# Patient Record
Sex: Male | Born: 1945 | Race: White | Hispanic: No | Smoking: Never smoker
Health system: Southern US, Community
[De-identification: ages and names within clinical notes are randomized; demographics above are authoritative.]

## PROBLEM LIST (undated history)

## (undated) DIAGNOSIS — Z973 Presence of spectacles and contact lenses: Secondary | ICD-10-CM

## (undated) DIAGNOSIS — E785 Hyperlipidemia, unspecified: Secondary | ICD-10-CM

## (undated) DIAGNOSIS — K4090 Unilateral inguinal hernia, without obstruction or gangrene, not specified as recurrent: Secondary | ICD-10-CM

## (undated) DIAGNOSIS — I2510 Atherosclerotic heart disease of native coronary artery without angina pectoris: Secondary | ICD-10-CM

## (undated) DIAGNOSIS — N200 Calculus of kidney: Secondary | ICD-10-CM

## (undated) DIAGNOSIS — N400 Enlarged prostate without lower urinary tract symptoms: Secondary | ICD-10-CM

## (undated) DIAGNOSIS — Z972 Presence of dental prosthetic device (complete) (partial): Secondary | ICD-10-CM

## (undated) DIAGNOSIS — K219 Gastro-esophageal reflux disease without esophagitis: Secondary | ICD-10-CM

## (undated) DIAGNOSIS — I4891 Unspecified atrial fibrillation: Secondary | ICD-10-CM

## (undated) DIAGNOSIS — C9110 Chronic lymphocytic leukemia of B-cell type not having achieved remission: Secondary | ICD-10-CM

## (undated) DIAGNOSIS — R7303 Prediabetes: Secondary | ICD-10-CM

## (undated) DIAGNOSIS — M1990 Unspecified osteoarthritis, unspecified site: Secondary | ICD-10-CM

## (undated) DIAGNOSIS — H9190 Unspecified hearing loss, unspecified ear: Secondary | ICD-10-CM

## (undated) DIAGNOSIS — D801 Nonfamilial hypogammaglobulinemia: Secondary | ICD-10-CM

## (undated) DIAGNOSIS — I350 Nonrheumatic aortic (valve) stenosis: Secondary | ICD-10-CM

## (undated) DIAGNOSIS — I10 Essential (primary) hypertension: Secondary | ICD-10-CM

## (undated) HISTORY — DX: Atherosclerotic heart disease of native coronary artery without angina pectoris: I25.10

## (undated) HISTORY — DX: Unspecified osteoarthritis, unspecified site: M19.90

## (undated) HISTORY — PX: BONE MARROW ASPIRATION: SHX1252

## (undated) HISTORY — DX: Essential (primary) hypertension: I10

## (undated) HISTORY — PX: MULTIPLE TOOTH EXTRACTIONS: SHX2053

## (undated) HISTORY — DX: Nonrheumatic aortic (valve) stenosis: I35.0

## (undated) HISTORY — DX: Hyperlipidemia, unspecified: E78.5

## (undated) HISTORY — PX: PORTACATH PLACEMENT: SHX2246

## (undated) HISTORY — DX: Gastro-esophageal reflux disease without esophagitis: K21.9

## (undated) HISTORY — PX: COLON SURGERY: SHX602

## (undated) HISTORY — DX: Chronic lymphocytic leukemia of B-cell type not having achieved remission: C91.10

## (undated) HISTORY — DX: Benign prostatic hyperplasia without lower urinary tract symptoms: N40.0

## (undated) HISTORY — DX: Unspecified atrial fibrillation: I48.91

## (undated) HISTORY — PX: ESOPHAGOGASTRODUODENOSCOPY: SHX1529

## (undated) HISTORY — DX: Unilateral inguinal hernia, without obstruction or gangrene, not specified as recurrent: K40.90

## (undated) HISTORY — PX: LITHOTRIPSY: SUR834

---

## 2003-03-18 ENCOUNTER — Encounter: Admission: RE | Admit: 2003-03-18 | Discharge: 2003-03-18 | Payer: Self-pay | Admitting: Oncology

## 2003-03-18 ENCOUNTER — Encounter (HOSPITAL_COMMUNITY): Admission: RE | Admit: 2003-03-18 | Discharge: 2003-04-17 | Payer: Self-pay | Admitting: Oncology

## 2003-06-20 ENCOUNTER — Encounter: Admission: RE | Admit: 2003-06-20 | Discharge: 2003-06-20 | Payer: Self-pay | Admitting: Oncology

## 2003-06-20 ENCOUNTER — Encounter (HOSPITAL_COMMUNITY): Admission: RE | Admit: 2003-06-20 | Discharge: 2003-07-20 | Payer: Self-pay | Admitting: Oncology

## 2003-08-13 ENCOUNTER — Ambulatory Visit (HOSPITAL_COMMUNITY): Admission: RE | Admit: 2003-08-13 | Discharge: 2003-08-13 | Payer: Self-pay | Admitting: Family Medicine

## 2003-08-16 ENCOUNTER — Emergency Department (HOSPITAL_COMMUNITY): Admission: EM | Admit: 2003-08-16 | Discharge: 2003-08-16 | Payer: Self-pay | Admitting: Emergency Medicine

## 2003-08-26 ENCOUNTER — Ambulatory Visit (HOSPITAL_COMMUNITY): Admission: RE | Admit: 2003-08-26 | Discharge: 2003-08-26 | Payer: Self-pay | Admitting: Orthopaedic Surgery

## 2003-09-11 ENCOUNTER — Encounter: Admission: RE | Admit: 2003-09-11 | Discharge: 2003-09-11 | Payer: Self-pay | Admitting: Oncology

## 2003-09-11 ENCOUNTER — Encounter (HOSPITAL_COMMUNITY): Admission: RE | Admit: 2003-09-11 | Discharge: 2003-10-11 | Payer: Self-pay | Admitting: Oncology

## 2003-12-09 ENCOUNTER — Encounter (HOSPITAL_COMMUNITY): Admission: RE | Admit: 2003-12-09 | Discharge: 2003-12-27 | Payer: Self-pay | Admitting: Oncology

## 2003-12-09 ENCOUNTER — Encounter: Admission: RE | Admit: 2003-12-09 | Discharge: 2003-12-27 | Payer: Self-pay | Admitting: Oncology

## 2004-01-28 ENCOUNTER — Encounter: Admission: RE | Admit: 2004-01-28 | Discharge: 2004-01-28 | Payer: Self-pay | Admitting: Oncology

## 2004-01-28 ENCOUNTER — Ambulatory Visit (HOSPITAL_COMMUNITY): Payer: Self-pay | Admitting: Oncology

## 2004-01-28 ENCOUNTER — Encounter (HOSPITAL_COMMUNITY): Admission: RE | Admit: 2004-01-28 | Discharge: 2004-02-27 | Payer: Self-pay | Admitting: Oncology

## 2004-02-03 ENCOUNTER — Ambulatory Visit: Payer: Self-pay | Admitting: Family Medicine

## 2004-04-01 ENCOUNTER — Ambulatory Visit (HOSPITAL_COMMUNITY): Admission: RE | Admit: 2004-04-01 | Discharge: 2004-04-01 | Payer: Self-pay | Admitting: General Surgery

## 2004-04-06 ENCOUNTER — Ambulatory Visit (HOSPITAL_COMMUNITY): Payer: Self-pay | Admitting: Oncology

## 2004-04-06 ENCOUNTER — Encounter: Admission: RE | Admit: 2004-04-06 | Discharge: 2004-04-06 | Payer: Self-pay | Admitting: Oncology

## 2004-04-06 ENCOUNTER — Encounter (HOSPITAL_COMMUNITY): Admission: RE | Admit: 2004-04-06 | Discharge: 2004-05-06 | Payer: Self-pay | Admitting: Oncology

## 2004-05-07 ENCOUNTER — Encounter (HOSPITAL_COMMUNITY): Admission: RE | Admit: 2004-05-07 | Discharge: 2004-06-06 | Payer: Self-pay | Admitting: Oncology

## 2004-05-07 ENCOUNTER — Encounter: Admission: RE | Admit: 2004-05-07 | Discharge: 2004-05-07 | Payer: Self-pay | Admitting: Oncology

## 2004-05-29 ENCOUNTER — Ambulatory Visit (HOSPITAL_COMMUNITY): Payer: Self-pay | Admitting: Oncology

## 2004-06-08 ENCOUNTER — Encounter (HOSPITAL_COMMUNITY): Admission: RE | Admit: 2004-06-08 | Discharge: 2004-07-08 | Payer: Self-pay | Admitting: Oncology

## 2004-06-08 ENCOUNTER — Encounter: Admission: RE | Admit: 2004-06-08 | Discharge: 2004-06-08 | Payer: Self-pay | Admitting: Oncology

## 2004-06-16 ENCOUNTER — Ambulatory Visit: Payer: Self-pay | Admitting: Family Medicine

## 2004-06-18 ENCOUNTER — Encounter (HOSPITAL_COMMUNITY): Admission: RE | Admit: 2004-06-18 | Discharge: 2004-07-18 | Payer: Self-pay | Admitting: Oncology

## 2004-07-13 ENCOUNTER — Encounter (HOSPITAL_COMMUNITY): Admission: RE | Admit: 2004-07-13 | Discharge: 2004-08-12 | Payer: Self-pay | Admitting: Oncology

## 2004-07-13 ENCOUNTER — Encounter: Admission: RE | Admit: 2004-07-13 | Discharge: 2004-07-13 | Payer: Self-pay | Admitting: Oncology

## 2004-07-17 ENCOUNTER — Ambulatory Visit (HOSPITAL_COMMUNITY): Payer: Self-pay | Admitting: Oncology

## 2004-08-28 ENCOUNTER — Encounter (HOSPITAL_COMMUNITY): Admission: RE | Admit: 2004-08-28 | Discharge: 2004-09-27 | Payer: Self-pay | Admitting: Oncology

## 2004-08-28 ENCOUNTER — Encounter: Admission: RE | Admit: 2004-08-28 | Discharge: 2004-08-28 | Payer: Self-pay | Admitting: Oncology

## 2004-09-03 ENCOUNTER — Ambulatory Visit (HOSPITAL_COMMUNITY): Payer: Self-pay | Admitting: Oncology

## 2004-10-05 ENCOUNTER — Ambulatory Visit: Payer: Self-pay | Admitting: Family Medicine

## 2004-10-21 ENCOUNTER — Encounter: Admission: RE | Admit: 2004-10-21 | Discharge: 2004-10-21 | Payer: Self-pay | Admitting: Oncology

## 2004-10-21 ENCOUNTER — Encounter (HOSPITAL_COMMUNITY): Admission: RE | Admit: 2004-10-21 | Discharge: 2004-11-20 | Payer: Self-pay | Admitting: Oncology

## 2004-11-17 ENCOUNTER — Ambulatory Visit (HOSPITAL_COMMUNITY): Payer: Self-pay | Admitting: Oncology

## 2004-11-26 ENCOUNTER — Ambulatory Visit: Payer: Self-pay | Admitting: Family Medicine

## 2004-12-07 ENCOUNTER — Ambulatory Visit: Payer: Self-pay | Admitting: Family Medicine

## 2004-12-11 ENCOUNTER — Ambulatory Visit (HOSPITAL_COMMUNITY): Admission: RE | Admit: 2004-12-11 | Discharge: 2004-12-11 | Payer: Self-pay | Admitting: General Surgery

## 2004-12-14 ENCOUNTER — Emergency Department (HOSPITAL_COMMUNITY): Admission: EM | Admit: 2004-12-14 | Discharge: 2004-12-14 | Payer: Self-pay | Admitting: Emergency Medicine

## 2004-12-21 ENCOUNTER — Encounter (HOSPITAL_COMMUNITY): Admission: RE | Admit: 2004-12-21 | Discharge: 2004-12-25 | Payer: Self-pay | Admitting: Oncology

## 2004-12-21 ENCOUNTER — Encounter: Admission: RE | Admit: 2004-12-21 | Discharge: 2004-12-25 | Payer: Self-pay | Admitting: Oncology

## 2004-12-22 ENCOUNTER — Ambulatory Visit (HOSPITAL_COMMUNITY): Admission: RE | Admit: 2004-12-22 | Discharge: 2004-12-22 | Payer: Self-pay | Admitting: General Surgery

## 2005-01-04 ENCOUNTER — Encounter (HOSPITAL_COMMUNITY): Admission: RE | Admit: 2005-01-04 | Discharge: 2005-02-03 | Payer: Self-pay | Admitting: Oncology

## 2005-01-04 ENCOUNTER — Encounter: Admission: RE | Admit: 2005-01-04 | Discharge: 2005-01-04 | Payer: Self-pay | Admitting: Oncology

## 2005-01-25 ENCOUNTER — Ambulatory Visit (HOSPITAL_COMMUNITY): Payer: Self-pay | Admitting: Oncology

## 2005-01-27 ENCOUNTER — Ambulatory Visit (HOSPITAL_COMMUNITY): Admission: RE | Admit: 2005-01-27 | Discharge: 2005-01-27 | Payer: Self-pay | Admitting: Oncology

## 2005-02-26 ENCOUNTER — Encounter: Admission: RE | Admit: 2005-02-26 | Discharge: 2005-02-26 | Payer: Self-pay | Admitting: Oncology

## 2005-02-26 ENCOUNTER — Encounter (HOSPITAL_COMMUNITY): Admission: RE | Admit: 2005-02-26 | Discharge: 2005-02-26 | Payer: Self-pay | Admitting: Oncology

## 2005-03-12 ENCOUNTER — Ambulatory Visit (HOSPITAL_COMMUNITY): Payer: Self-pay | Admitting: Oncology

## 2005-03-30 ENCOUNTER — Encounter (HOSPITAL_COMMUNITY): Admission: RE | Admit: 2005-03-30 | Discharge: 2005-04-29 | Payer: Self-pay | Admitting: Oncology

## 2005-03-30 ENCOUNTER — Encounter: Admission: RE | Admit: 2005-03-30 | Discharge: 2005-03-30 | Payer: Self-pay | Admitting: Oncology

## 2005-04-27 ENCOUNTER — Ambulatory Visit (HOSPITAL_COMMUNITY): Payer: Self-pay | Admitting: Oncology

## 2005-05-02 ENCOUNTER — Encounter: Admission: RE | Admit: 2005-05-02 | Discharge: 2005-05-02 | Payer: Self-pay | Admitting: Oncology

## 2005-05-03 ENCOUNTER — Encounter: Admission: RE | Admit: 2005-05-03 | Discharge: 2005-05-03 | Payer: Self-pay | Admitting: Oncology

## 2005-05-03 ENCOUNTER — Encounter (HOSPITAL_COMMUNITY): Admission: RE | Admit: 2005-05-03 | Discharge: 2005-06-02 | Payer: Self-pay | Admitting: Oncology

## 2005-05-24 ENCOUNTER — Ambulatory Visit (HOSPITAL_COMMUNITY): Admission: RE | Admit: 2005-05-24 | Discharge: 2005-05-24 | Payer: Self-pay | Admitting: Oncology

## 2005-06-01 ENCOUNTER — Encounter: Admission: RE | Admit: 2005-06-01 | Discharge: 2005-06-01 | Payer: Self-pay | Admitting: Oncology

## 2005-06-01 ENCOUNTER — Encounter (HOSPITAL_COMMUNITY): Admission: RE | Admit: 2005-06-01 | Discharge: 2005-07-01 | Payer: Self-pay | Admitting: Oncology

## 2005-06-14 ENCOUNTER — Ambulatory Visit (HOSPITAL_COMMUNITY): Payer: Self-pay | Admitting: Oncology

## 2005-06-18 ENCOUNTER — Ambulatory Visit: Payer: Self-pay | Admitting: Family Medicine

## 2005-06-18 ENCOUNTER — Ambulatory Visit (HOSPITAL_COMMUNITY): Admission: RE | Admit: 2005-06-18 | Discharge: 2005-06-18 | Payer: Self-pay | Admitting: Family Medicine

## 2005-06-24 ENCOUNTER — Ambulatory Visit: Payer: Self-pay | Admitting: *Deleted

## 2005-07-06 ENCOUNTER — Ambulatory Visit (HOSPITAL_COMMUNITY): Admission: RE | Admit: 2005-07-06 | Discharge: 2005-07-06 | Payer: Self-pay | Admitting: Family Medicine

## 2005-07-06 ENCOUNTER — Ambulatory Visit: Payer: Self-pay | Admitting: Family Medicine

## 2005-07-07 ENCOUNTER — Ambulatory Visit (HOSPITAL_COMMUNITY): Admission: RE | Admit: 2005-07-07 | Discharge: 2005-07-07 | Payer: Self-pay | Admitting: Urology

## 2005-07-12 ENCOUNTER — Ambulatory Visit: Payer: Self-pay | Admitting: Family Medicine

## 2005-07-12 ENCOUNTER — Encounter (HOSPITAL_COMMUNITY): Admission: RE | Admit: 2005-07-12 | Discharge: 2005-08-11 | Payer: Self-pay | Admitting: Oncology

## 2005-07-12 ENCOUNTER — Encounter: Admission: RE | Admit: 2005-07-12 | Discharge: 2005-07-12 | Payer: Self-pay | Admitting: Oncology

## 2005-08-03 ENCOUNTER — Ambulatory Visit (HOSPITAL_COMMUNITY): Payer: Self-pay | Admitting: Oncology

## 2005-08-09 ENCOUNTER — Ambulatory Visit: Payer: Self-pay | Admitting: Family Medicine

## 2005-08-16 ENCOUNTER — Encounter: Admission: RE | Admit: 2005-08-16 | Discharge: 2005-08-16 | Payer: Self-pay | Admitting: Oncology

## 2005-08-16 ENCOUNTER — Encounter (HOSPITAL_COMMUNITY): Admission: RE | Admit: 2005-08-16 | Discharge: 2005-09-15 | Payer: Self-pay | Admitting: Oncology

## 2005-08-24 ENCOUNTER — Ambulatory Visit (HOSPITAL_COMMUNITY): Admission: RE | Admit: 2005-08-24 | Discharge: 2005-08-24 | Payer: Self-pay | Admitting: Oncology

## 2005-08-30 ENCOUNTER — Ambulatory Visit: Admission: RE | Admit: 2005-08-30 | Discharge: 2005-10-11 | Payer: Self-pay | Admitting: Radiation Oncology

## 2005-09-22 ENCOUNTER — Ambulatory Visit: Payer: Self-pay | Admitting: Family Medicine

## 2005-09-27 ENCOUNTER — Ambulatory Visit (HOSPITAL_COMMUNITY): Payer: Self-pay | Admitting: Oncology

## 2005-09-27 ENCOUNTER — Encounter (HOSPITAL_COMMUNITY): Admission: RE | Admit: 2005-09-27 | Discharge: 2005-10-27 | Payer: Self-pay | Admitting: Oncology

## 2005-09-27 ENCOUNTER — Encounter: Admission: RE | Admit: 2005-09-27 | Discharge: 2005-09-27 | Payer: Self-pay | Admitting: Oncology

## 2005-11-08 ENCOUNTER — Encounter: Admission: RE | Admit: 2005-11-08 | Discharge: 2005-11-08 | Payer: Self-pay | Admitting: Oncology

## 2005-12-04 ENCOUNTER — Emergency Department (HOSPITAL_COMMUNITY): Admission: EM | Admit: 2005-12-04 | Discharge: 2005-12-04 | Payer: Self-pay | Admitting: Emergency Medicine

## 2005-12-14 ENCOUNTER — Encounter: Payer: Self-pay | Admitting: Family Medicine

## 2005-12-14 LAB — CONVERTED CEMR LAB: PSA: 0.08 ng/mL

## 2005-12-20 ENCOUNTER — Encounter: Admission: RE | Admit: 2005-12-20 | Discharge: 2005-12-24 | Payer: Self-pay | Admitting: Oncology

## 2005-12-20 ENCOUNTER — Encounter (HOSPITAL_COMMUNITY): Admission: RE | Admit: 2005-12-20 | Discharge: 2005-12-24 | Payer: Self-pay | Admitting: Oncology

## 2005-12-20 ENCOUNTER — Ambulatory Visit (HOSPITAL_COMMUNITY): Payer: Self-pay | Admitting: Oncology

## 2005-12-21 ENCOUNTER — Ambulatory Visit: Payer: Self-pay | Admitting: Family Medicine

## 2006-01-13 ENCOUNTER — Ambulatory Visit: Payer: Self-pay | Admitting: Family Medicine

## 2006-01-31 ENCOUNTER — Encounter: Admission: RE | Admit: 2006-01-31 | Discharge: 2006-01-31 | Payer: Self-pay | Admitting: Oncology

## 2006-01-31 ENCOUNTER — Encounter (HOSPITAL_COMMUNITY): Admission: RE | Admit: 2006-01-31 | Discharge: 2006-03-02 | Payer: Self-pay | Admitting: Oncology

## 2006-02-01 ENCOUNTER — Encounter (HOSPITAL_COMMUNITY): Payer: Self-pay | Admitting: Oncology

## 2006-02-25 ENCOUNTER — Ambulatory Visit (HOSPITAL_COMMUNITY): Admission: RE | Admit: 2006-02-25 | Discharge: 2006-02-25 | Payer: Self-pay | Admitting: Oncology

## 2006-03-02 ENCOUNTER — Ambulatory Visit (HOSPITAL_COMMUNITY): Payer: Self-pay | Admitting: Oncology

## 2006-04-26 ENCOUNTER — Ambulatory Visit (HOSPITAL_COMMUNITY): Payer: Self-pay | Admitting: Oncology

## 2006-04-27 ENCOUNTER — Ambulatory Visit: Payer: Self-pay | Admitting: Family Medicine

## 2006-06-07 ENCOUNTER — Encounter (HOSPITAL_COMMUNITY): Admission: RE | Admit: 2006-06-07 | Discharge: 2006-07-07 | Payer: Self-pay | Admitting: Oncology

## 2006-07-15 ENCOUNTER — Encounter: Payer: Self-pay | Admitting: Family Medicine

## 2006-07-15 LAB — CONVERTED CEMR LAB
ALT: 23 units/L (ref 0–53)
AST: 17 units/L (ref 0–37)
Albumin: 3.9 g/dL (ref 3.5–5.2)
Alkaline Phosphatase: 102 units/L (ref 39–117)
Bilirubin, Direct: 0.1 mg/dL (ref 0.0–0.3)
Cholesterol: 252 mg/dL — ABNORMAL HIGH (ref 0–200)
HDL: 27 mg/dL — ABNORMAL LOW (ref >39)
Total CHOL/HDL Ratio: 9.3

## 2006-07-19 ENCOUNTER — Ambulatory Visit (HOSPITAL_COMMUNITY): Payer: Self-pay | Admitting: Oncology

## 2006-07-27 ENCOUNTER — Ambulatory Visit: Payer: Self-pay | Admitting: Family Medicine

## 2006-08-25 ENCOUNTER — Ambulatory Visit (HOSPITAL_COMMUNITY): Admission: RE | Admit: 2006-08-25 | Discharge: 2006-08-25 | Payer: Self-pay | Admitting: Oncology

## 2006-08-30 ENCOUNTER — Ambulatory Visit: Payer: Self-pay | Admitting: Cardiology

## 2006-08-31 ENCOUNTER — Encounter (HOSPITAL_COMMUNITY): Payer: Self-pay | Admitting: Oncology

## 2006-08-31 ENCOUNTER — Encounter (HOSPITAL_COMMUNITY): Admission: RE | Admit: 2006-08-31 | Discharge: 2006-09-30 | Payer: Self-pay | Admitting: Oncology

## 2006-09-01 ENCOUNTER — Ambulatory Visit: Payer: Self-pay | Admitting: Family Medicine

## 2006-09-15 ENCOUNTER — Ambulatory Visit: Payer: Self-pay | Admitting: Family Medicine

## 2006-09-16 ENCOUNTER — Encounter: Payer: Self-pay | Admitting: Family Medicine

## 2006-09-23 ENCOUNTER — Ambulatory Visit: Payer: Self-pay | Admitting: Internal Medicine

## 2006-09-23 ENCOUNTER — Ambulatory Visit (HOSPITAL_COMMUNITY): Admission: RE | Admit: 2006-09-23 | Discharge: 2006-09-23 | Payer: Self-pay | Admitting: Internal Medicine

## 2006-10-10 ENCOUNTER — Ambulatory Visit (HOSPITAL_COMMUNITY): Admission: RE | Admit: 2006-10-10 | Discharge: 2006-10-10 | Payer: Self-pay | Admitting: Urology

## 2006-10-10 ENCOUNTER — Encounter (INDEPENDENT_AMBULATORY_CARE_PROVIDER_SITE_OTHER): Payer: Self-pay | Admitting: Urology

## 2006-10-12 ENCOUNTER — Ambulatory Visit (HOSPITAL_COMMUNITY): Payer: Self-pay | Admitting: Oncology

## 2006-11-09 ENCOUNTER — Encounter: Payer: Self-pay | Admitting: Family Medicine

## 2006-11-09 LAB — CONVERTED CEMR LAB
Cholesterol: 258 mg/dL — ABNORMAL HIGH (ref 0–200)
Total CHOL/HDL Ratio: 8.1
VLDL: 47 mg/dL — ABNORMAL HIGH (ref 0–40)

## 2006-11-14 ENCOUNTER — Encounter: Payer: Self-pay | Admitting: Family Medicine

## 2006-11-14 LAB — CONVERTED CEMR LAB
ALT: 22 units/L (ref 0–53)
Bilirubin, Direct: 0.1 mg/dL (ref 0.0–0.3)
Indirect Bilirubin: 0.5 mg/dL (ref 0.0–0.9)
Total Bilirubin: 0.6 mg/dL (ref 0.3–1.2)

## 2006-11-15 ENCOUNTER — Ambulatory Visit: Payer: Self-pay | Admitting: Family Medicine

## 2006-11-16 ENCOUNTER — Ambulatory Visit: Payer: Self-pay | Admitting: Cardiovascular Disease

## 2006-11-23 ENCOUNTER — Encounter (HOSPITAL_COMMUNITY): Admission: RE | Admit: 2006-11-23 | Discharge: 2006-12-23 | Payer: Self-pay | Admitting: Oncology

## 2006-12-07 ENCOUNTER — Encounter (HOSPITAL_COMMUNITY): Admission: RE | Admit: 2006-12-07 | Discharge: 2006-12-27 | Payer: Self-pay | Admitting: Cardiovascular Disease

## 2006-12-07 ENCOUNTER — Ambulatory Visit: Payer: Self-pay | Admitting: Cardiovascular Disease

## 2006-12-22 ENCOUNTER — Ambulatory Visit: Payer: Self-pay | Admitting: Cardiovascular Disease

## 2006-12-23 ENCOUNTER — Ambulatory Visit: Payer: Self-pay | Admitting: Family Medicine

## 2007-01-04 ENCOUNTER — Ambulatory Visit (HOSPITAL_COMMUNITY): Payer: Self-pay | Admitting: Oncology

## 2007-01-09 ENCOUNTER — Ambulatory Visit: Payer: Self-pay | Admitting: Family Medicine

## 2007-01-25 ENCOUNTER — Ambulatory Visit: Payer: Self-pay | Admitting: Family Medicine

## 2007-01-25 ENCOUNTER — Ambulatory Visit (HOSPITAL_COMMUNITY): Admission: RE | Admit: 2007-01-25 | Discharge: 2007-01-25 | Payer: Self-pay | Admitting: Family Medicine

## 2007-01-27 ENCOUNTER — Encounter: Payer: Self-pay | Admitting: Family Medicine

## 2007-01-30 ENCOUNTER — Ambulatory Visit (HOSPITAL_COMMUNITY): Admission: RE | Admit: 2007-01-30 | Discharge: 2007-01-30 | Payer: Self-pay | Admitting: Family Medicine

## 2007-02-15 ENCOUNTER — Encounter (HOSPITAL_COMMUNITY): Admission: RE | Admit: 2007-02-15 | Discharge: 2007-03-17 | Payer: Self-pay | Admitting: Oncology

## 2007-03-02 ENCOUNTER — Ambulatory Visit (HOSPITAL_COMMUNITY): Admission: RE | Admit: 2007-03-02 | Discharge: 2007-03-02 | Payer: Self-pay | Admitting: Oncology

## 2007-03-03 ENCOUNTER — Ambulatory Visit (HOSPITAL_COMMUNITY): Payer: Self-pay | Admitting: Oncology

## 2007-03-15 ENCOUNTER — Ambulatory Visit: Payer: Self-pay | Admitting: Family Medicine

## 2007-03-15 LAB — CONVERTED CEMR LAB
ALT: 35 units/L (ref 0–53)
Alkaline Phosphatase: 100 units/L (ref 39–117)
BUN: 17 mg/dL (ref 6–23)
Basophils Relative: 0 % (ref 0–1)
Bilirubin, Direct: 0.1 mg/dL (ref 0.0–0.3)
Calcium: 8.9 mg/dL (ref 8.4–10.5)
Cholesterol: 284 mg/dL — ABNORMAL HIGH (ref 0–200)
Creatinine, Ser: 1.11 mg/dL (ref 0.40–1.50)
Eosinophils Absolute: 0.3 10*3/uL (ref 0.2–0.7)
Eosinophils Relative: 5 % (ref 0–5)
Glucose, Bld: 98 mg/dL (ref 70–99)
HCT: 42.7 % (ref 39.0–52.0)
Indirect Bilirubin: 0.6 mg/dL (ref 0.0–0.9)
Lymphs Abs: 2.4 10*3/uL (ref 0.7–4.0)
MCHC: 33.3 g/dL (ref 30.0–36.0)
MCV: 102.4 fL — ABNORMAL HIGH (ref 78.0–100.0)
Monocytes Absolute: 0.5 10*3/uL (ref 0.1–1.0)
Monocytes Relative: 8 % (ref 3–12)
Neutrophils Relative %: 46 % (ref 43–77)
RBC: 4.17 M/uL — ABNORMAL LOW (ref 4.22–5.81)
Total Protein: 6.2 g/dL (ref 6.0–8.3)
Triglycerides: 202 mg/dL — ABNORMAL HIGH (ref <150)
VLDL: 40 mg/dL (ref 0–40)
WBC: 5.9 10*3/uL (ref 4.0–10.5)

## 2007-03-16 ENCOUNTER — Encounter: Payer: Self-pay | Admitting: Family Medicine

## 2007-03-16 LAB — CONVERTED CEMR LAB
Blood Glucose, Fasting: 98 mg/dL
RBC count: 4.17 10*6/uL

## 2007-04-07 ENCOUNTER — Encounter: Payer: Self-pay | Admitting: Family Medicine

## 2007-04-07 DIAGNOSIS — E782 Mixed hyperlipidemia: Secondary | ICD-10-CM

## 2007-04-25 ENCOUNTER — Ambulatory Visit: Payer: Self-pay | Admitting: Family Medicine

## 2007-04-25 LAB — CONVERTED CEMR LAB: PSA: 0.41 ng/mL (ref 0.10–4.00)

## 2007-05-18 ENCOUNTER — Ambulatory Visit (HOSPITAL_COMMUNITY): Payer: Self-pay | Admitting: Oncology

## 2007-05-18 ENCOUNTER — Encounter (HOSPITAL_COMMUNITY): Admission: RE | Admit: 2007-05-18 | Discharge: 2007-06-17 | Payer: Self-pay | Admitting: Oncology

## 2007-07-19 ENCOUNTER — Ambulatory Visit: Payer: Self-pay | Admitting: Cardiovascular Disease

## 2007-07-24 ENCOUNTER — Ambulatory Visit: Payer: Self-pay | Admitting: Family Medicine

## 2007-07-25 ENCOUNTER — Encounter: Payer: Self-pay | Admitting: Family Medicine

## 2007-07-25 LAB — CONVERTED CEMR LAB
AST: 16 units/L (ref 0–37)
Alkaline Phosphatase: 82 units/L (ref 39–117)
Indirect Bilirubin: 0.6 mg/dL (ref 0.0–0.9)
LDL Cholesterol: 226 mg/dL — ABNORMAL HIGH (ref 0–99)
Total Bilirubin: 0.7 mg/dL (ref 0.3–1.2)
Total Protein: 6.1 g/dL (ref 6.0–8.3)
Triglycerides: 188 mg/dL — ABNORMAL HIGH (ref <150)

## 2007-07-26 ENCOUNTER — Ambulatory Visit (HOSPITAL_COMMUNITY): Admission: RE | Admit: 2007-07-26 | Discharge: 2007-07-26 | Payer: Self-pay | Admitting: Urology

## 2007-08-10 ENCOUNTER — Ambulatory Visit (HOSPITAL_COMMUNITY): Payer: Self-pay | Admitting: Oncology

## 2007-08-18 ENCOUNTER — Encounter: Payer: Self-pay | Admitting: Cardiovascular Disease

## 2007-08-18 ENCOUNTER — Ambulatory Visit (HOSPITAL_COMMUNITY): Admission: RE | Admit: 2007-08-18 | Discharge: 2007-08-18 | Payer: Self-pay | Admitting: Cardiovascular Disease

## 2007-08-18 ENCOUNTER — Ambulatory Visit: Payer: Self-pay | Admitting: Cardiovascular Disease

## 2007-08-23 ENCOUNTER — Ambulatory Visit: Payer: Self-pay | Admitting: Cardiovascular Disease

## 2007-08-23 ENCOUNTER — Emergency Department (HOSPITAL_COMMUNITY): Admission: EM | Admit: 2007-08-23 | Discharge: 2007-08-24 | Payer: Self-pay | Admitting: Emergency Medicine

## 2007-08-28 ENCOUNTER — Ambulatory Visit: Payer: Self-pay | Admitting: Family Medicine

## 2007-09-22 ENCOUNTER — Encounter (HOSPITAL_COMMUNITY): Admission: RE | Admit: 2007-09-22 | Discharge: 2007-10-22 | Payer: Self-pay | Admitting: Oncology

## 2007-10-02 DIAGNOSIS — C8190 Hodgkin lymphoma, unspecified, unspecified site: Secondary | ICD-10-CM

## 2007-10-02 DIAGNOSIS — C9110 Chronic lymphocytic leukemia of B-cell type not having achieved remission: Secondary | ICD-10-CM

## 2007-10-03 ENCOUNTER — Ambulatory Visit (HOSPITAL_COMMUNITY): Admission: RE | Admit: 2007-10-03 | Discharge: 2007-10-03 | Payer: Self-pay | Admitting: Oncology

## 2007-10-04 ENCOUNTER — Ambulatory Visit (HOSPITAL_COMMUNITY): Payer: Self-pay | Admitting: Oncology

## 2007-10-17 ENCOUNTER — Ambulatory Visit: Payer: Self-pay | Admitting: Cardiovascular Disease

## 2007-10-17 ENCOUNTER — Ambulatory Visit (HOSPITAL_COMMUNITY): Admission: RE | Admit: 2007-10-17 | Discharge: 2007-10-17 | Payer: Self-pay | Admitting: Cardiovascular Disease

## 2007-10-24 ENCOUNTER — Ambulatory Visit: Payer: Self-pay | Admitting: Cardiology

## 2007-10-24 ENCOUNTER — Inpatient Hospital Stay (HOSPITAL_BASED_OUTPATIENT_CLINIC_OR_DEPARTMENT_OTHER): Admission: RE | Admit: 2007-10-24 | Discharge: 2007-10-24 | Payer: Self-pay | Admitting: Cardiovascular Disease

## 2007-10-26 ENCOUNTER — Ambulatory Visit: Payer: Self-pay | Admitting: Thoracic Surgery (Cardiothoracic Vascular Surgery)

## 2007-10-26 ENCOUNTER — Ambulatory Visit: Payer: Self-pay | Admitting: Cardiovascular Disease

## 2007-10-31 ENCOUNTER — Encounter
Admission: RE | Admit: 2007-10-31 | Discharge: 2007-10-31 | Payer: Self-pay | Admitting: Thoracic Surgery (Cardiothoracic Vascular Surgery)

## 2007-11-07 ENCOUNTER — Telehealth: Payer: Self-pay | Admitting: Internal Medicine

## 2007-11-07 DIAGNOSIS — R1319 Other dysphagia: Secondary | ICD-10-CM

## 2007-11-09 ENCOUNTER — Telehealth: Payer: Self-pay | Admitting: Family Medicine

## 2007-11-09 ENCOUNTER — Telehealth: Payer: Self-pay | Admitting: Internal Medicine

## 2007-11-09 ENCOUNTER — Ambulatory Visit: Payer: Self-pay | Admitting: Family Medicine

## 2007-11-09 DIAGNOSIS — R509 Fever, unspecified: Secondary | ICD-10-CM

## 2007-11-09 LAB — CONVERTED CEMR LAB
Basophils Absolute: 0 10*3/uL (ref 0.0–0.1)
Basophils Relative: 1 % (ref 0–1)
Eosinophils Relative: 4 % (ref 0–5)
HCT: 38.5 % — ABNORMAL LOW (ref 39.0–52.0)
Hemoglobin: 13 g/dL (ref 13.0–17.0)
MCHC: 33.7 g/dL (ref 30.0–36.0)
Monocytes Absolute: 0.4 10*3/uL (ref 0.1–1.0)
Platelets: 100 10*3/uL — ABNORMAL LOW (ref 150–400)
RDW: 15.2 % (ref 11.5–15.5)

## 2007-11-12 DIAGNOSIS — I359 Nonrheumatic aortic valve disorder, unspecified: Secondary | ICD-10-CM | POA: Insufficient documentation

## 2007-11-20 ENCOUNTER — Encounter
Admission: RE | Admit: 2007-11-20 | Discharge: 2007-11-20 | Payer: Self-pay | Admitting: Thoracic Surgery (Cardiothoracic Vascular Surgery)

## 2007-11-20 ENCOUNTER — Ambulatory Visit: Payer: Self-pay | Admitting: Thoracic Surgery (Cardiothoracic Vascular Surgery)

## 2007-11-20 ENCOUNTER — Encounter: Payer: Self-pay | Admitting: Family Medicine

## 2007-11-21 ENCOUNTER — Telehealth: Payer: Self-pay | Admitting: Internal Medicine

## 2007-12-12 ENCOUNTER — Ambulatory Visit: Payer: Self-pay | Admitting: Internal Medicine

## 2007-12-12 ENCOUNTER — Ambulatory Visit (HOSPITAL_COMMUNITY): Admission: RE | Admit: 2007-12-12 | Discharge: 2007-12-12 | Payer: Self-pay | Admitting: Internal Medicine

## 2007-12-18 ENCOUNTER — Ambulatory Visit: Payer: Self-pay | Admitting: Thoracic Surgery (Cardiothoracic Vascular Surgery)

## 2007-12-21 ENCOUNTER — Ambulatory Visit (HOSPITAL_COMMUNITY): Payer: Self-pay | Admitting: Oncology

## 2007-12-28 ENCOUNTER — Ambulatory Visit: Payer: Self-pay | Admitting: Family Medicine

## 2007-12-28 HISTORY — PX: AORTIC VALVE REPLACEMENT: SHX41

## 2007-12-28 HISTORY — PX: CORONARY ARTERY BYPASS GRAFT: SHX141

## 2008-01-01 ENCOUNTER — Ambulatory Visit (HOSPITAL_COMMUNITY)
Admission: RE | Admit: 2008-01-01 | Discharge: 2008-01-01 | Payer: Self-pay | Admitting: Thoracic Surgery (Cardiothoracic Vascular Surgery)

## 2008-01-01 ENCOUNTER — Ambulatory Visit: Payer: Self-pay | Admitting: Thoracic Surgery (Cardiothoracic Vascular Surgery)

## 2008-01-01 ENCOUNTER — Ambulatory Visit: Payer: Self-pay | Admitting: Vascular Surgery

## 2008-01-01 ENCOUNTER — Encounter: Payer: Self-pay | Admitting: Thoracic Surgery (Cardiothoracic Vascular Surgery)

## 2008-01-05 ENCOUNTER — Encounter: Payer: Self-pay | Admitting: Family Medicine

## 2008-01-05 ENCOUNTER — Ambulatory Visit: Payer: Self-pay | Admitting: Thoracic Surgery (Cardiothoracic Vascular Surgery)

## 2008-01-05 ENCOUNTER — Encounter: Payer: Self-pay | Admitting: Thoracic Surgery (Cardiothoracic Vascular Surgery)

## 2008-01-05 ENCOUNTER — Inpatient Hospital Stay (HOSPITAL_COMMUNITY)
Admission: RE | Admit: 2008-01-05 | Discharge: 2008-01-12 | Payer: Self-pay | Admitting: Thoracic Surgery (Cardiothoracic Vascular Surgery)

## 2008-01-12 ENCOUNTER — Encounter: Payer: Self-pay | Admitting: Family Medicine

## 2008-01-22 ENCOUNTER — Ambulatory Visit: Payer: Self-pay | Admitting: Cardiology

## 2008-01-24 ENCOUNTER — Ambulatory Visit: Payer: Self-pay | Admitting: Cardiology

## 2008-01-24 ENCOUNTER — Encounter: Payer: Self-pay | Admitting: Family Medicine

## 2008-01-29 ENCOUNTER — Ambulatory Visit: Payer: Self-pay | Admitting: Cardiology

## 2008-01-30 ENCOUNTER — Encounter (HOSPITAL_COMMUNITY): Admission: RE | Admit: 2008-01-30 | Discharge: 2008-02-29 | Payer: Self-pay | Admitting: Cardiology

## 2008-02-05 ENCOUNTER — Ambulatory Visit: Payer: Self-pay | Admitting: Thoracic Surgery (Cardiothoracic Vascular Surgery)

## 2008-02-05 ENCOUNTER — Encounter
Admission: RE | Admit: 2008-02-05 | Discharge: 2008-02-05 | Payer: Self-pay | Admitting: Thoracic Surgery (Cardiothoracic Vascular Surgery)

## 2008-02-08 ENCOUNTER — Ambulatory Visit: Payer: Self-pay | Admitting: Cardiology

## 2008-02-09 ENCOUNTER — Encounter: Payer: Self-pay | Admitting: Family Medicine

## 2008-02-26 ENCOUNTER — Encounter: Payer: Self-pay | Admitting: Family Medicine

## 2008-02-26 ENCOUNTER — Ambulatory Visit: Payer: Self-pay | Admitting: Cardiology

## 2008-02-26 DIAGNOSIS — I2510 Atherosclerotic heart disease of native coronary artery without angina pectoris: Secondary | ICD-10-CM

## 2008-02-26 DIAGNOSIS — I4891 Unspecified atrial fibrillation: Secondary | ICD-10-CM

## 2008-03-01 ENCOUNTER — Encounter (HOSPITAL_COMMUNITY): Admission: RE | Admit: 2008-03-01 | Discharge: 2008-03-31 | Payer: Self-pay | Admitting: Cardiology

## 2008-03-07 ENCOUNTER — Ambulatory Visit: Payer: Self-pay | Admitting: Cardiology

## 2008-03-11 ENCOUNTER — Encounter: Payer: Self-pay | Admitting: Family Medicine

## 2008-03-25 ENCOUNTER — Ambulatory Visit: Payer: Self-pay | Admitting: Cardiology

## 2008-03-26 ENCOUNTER — Encounter (HOSPITAL_COMMUNITY): Admission: RE | Admit: 2008-03-26 | Discharge: 2008-04-25 | Payer: Self-pay | Admitting: Oncology

## 2008-03-26 ENCOUNTER — Ambulatory Visit (HOSPITAL_COMMUNITY): Payer: Self-pay | Admitting: Oncology

## 2008-03-28 ENCOUNTER — Ambulatory Visit: Payer: Self-pay | Admitting: Family Medicine

## 2008-04-03 ENCOUNTER — Encounter (HOSPITAL_COMMUNITY): Admission: RE | Admit: 2008-04-03 | Discharge: 2008-05-03 | Payer: Self-pay | Admitting: Cardiology

## 2008-04-11 ENCOUNTER — Ambulatory Visit: Payer: Self-pay | Admitting: Cardiology

## 2008-04-12 ENCOUNTER — Ambulatory Visit (HOSPITAL_COMMUNITY): Admission: RE | Admit: 2008-04-12 | Discharge: 2008-04-12 | Payer: Self-pay | Admitting: Oncology

## 2008-04-17 ENCOUNTER — Encounter: Payer: Self-pay | Admitting: Family Medicine

## 2008-04-24 ENCOUNTER — Ambulatory Visit: Payer: Self-pay | Admitting: Family Medicine

## 2008-04-25 ENCOUNTER — Telehealth: Payer: Self-pay | Admitting: Family Medicine

## 2008-04-25 ENCOUNTER — Ambulatory Visit: Payer: Self-pay | Admitting: Cardiology

## 2008-05-10 ENCOUNTER — Encounter (HOSPITAL_COMMUNITY): Admission: RE | Admit: 2008-05-10 | Discharge: 2008-06-09 | Payer: Self-pay | Admitting: Cardiology

## 2008-05-16 ENCOUNTER — Ambulatory Visit: Payer: Self-pay | Admitting: Cardiology

## 2008-05-31 ENCOUNTER — Ambulatory Visit: Payer: Self-pay | Admitting: Family Medicine

## 2008-06-06 ENCOUNTER — Encounter (HOSPITAL_COMMUNITY): Admission: RE | Admit: 2008-06-06 | Discharge: 2008-07-06 | Payer: Self-pay | Admitting: Oncology

## 2008-06-10 ENCOUNTER — Ambulatory Visit (HOSPITAL_COMMUNITY): Admission: RE | Admit: 2008-06-10 | Discharge: 2008-06-10 | Payer: Self-pay | Admitting: Oncology

## 2008-06-11 ENCOUNTER — Ambulatory Visit (HOSPITAL_COMMUNITY): Payer: Self-pay | Admitting: Oncology

## 2008-06-12 ENCOUNTER — Encounter: Payer: Self-pay | Admitting: Family Medicine

## 2008-06-12 ENCOUNTER — Encounter (HOSPITAL_COMMUNITY): Admission: RE | Admit: 2008-06-12 | Discharge: 2008-07-12 | Payer: Self-pay | Admitting: Cardiology

## 2008-06-14 ENCOUNTER — Ambulatory Visit: Payer: Self-pay | Admitting: Cardiology

## 2008-06-24 ENCOUNTER — Ambulatory Visit: Payer: Self-pay | Admitting: Cardiology

## 2008-07-15 ENCOUNTER — Encounter (HOSPITAL_COMMUNITY): Admission: RE | Admit: 2008-07-15 | Discharge: 2008-08-14 | Payer: Self-pay | Admitting: Cardiology

## 2008-07-15 ENCOUNTER — Encounter: Payer: Self-pay | Admitting: Family Medicine

## 2008-07-22 ENCOUNTER — Ambulatory Visit: Payer: Self-pay | Admitting: Cardiology

## 2008-07-31 ENCOUNTER — Ambulatory Visit (HOSPITAL_COMMUNITY): Payer: Self-pay | Admitting: Oncology

## 2008-08-01 ENCOUNTER — Ambulatory Visit: Payer: Self-pay | Admitting: Cardiology

## 2008-08-15 ENCOUNTER — Ambulatory Visit: Payer: Self-pay | Admitting: Cardiology

## 2008-08-29 ENCOUNTER — Ambulatory Visit: Payer: Self-pay | Admitting: Cardiology

## 2008-09-05 ENCOUNTER — Ambulatory Visit: Payer: Self-pay | Admitting: Cardiology

## 2008-09-18 ENCOUNTER — Encounter (INDEPENDENT_AMBULATORY_CARE_PROVIDER_SITE_OTHER): Payer: Self-pay | Admitting: *Deleted

## 2008-09-18 LAB — CONVERTED CEMR LAB
Albumin: 4.1 g/dL
Cholesterol: 181 mg/dL
HDL: 24 mg/dL
Triglycerides: 309 mg/dL

## 2008-09-20 ENCOUNTER — Ambulatory Visit: Payer: Self-pay | Admitting: Cardiology

## 2008-09-20 ENCOUNTER — Encounter: Payer: Self-pay | Admitting: Physician Assistant

## 2008-10-23 ENCOUNTER — Ambulatory Visit (HOSPITAL_COMMUNITY): Payer: Self-pay | Admitting: Oncology

## 2008-11-11 ENCOUNTER — Encounter: Payer: Self-pay | Admitting: *Deleted

## 2008-11-30 ENCOUNTER — Encounter: Payer: Self-pay | Admitting: Family Medicine

## 2008-12-04 ENCOUNTER — Encounter (HOSPITAL_COMMUNITY): Admission: RE | Admit: 2008-12-04 | Discharge: 2008-12-25 | Payer: Self-pay | Admitting: Oncology

## 2008-12-11 ENCOUNTER — Ambulatory Visit (HOSPITAL_COMMUNITY): Admission: RE | Admit: 2008-12-11 | Discharge: 2008-12-11 | Payer: Self-pay | Admitting: Oncology

## 2008-12-30 ENCOUNTER — Ambulatory Visit (HOSPITAL_COMMUNITY): Payer: Self-pay | Admitting: Internal Medicine

## 2008-12-31 ENCOUNTER — Ambulatory Visit: Payer: Self-pay | Admitting: Family Medicine

## 2009-01-31 ENCOUNTER — Ambulatory Visit (HOSPITAL_COMMUNITY): Payer: Self-pay | Admitting: Oncology

## 2009-01-31 ENCOUNTER — Encounter (HOSPITAL_COMMUNITY): Admission: RE | Admit: 2009-01-31 | Discharge: 2009-03-02 | Payer: Self-pay | Admitting: Oncology

## 2009-02-14 ENCOUNTER — Ambulatory Visit: Payer: Self-pay | Admitting: Family Medicine

## 2009-02-14 LAB — CONVERTED CEMR LAB
Calcium: 8.5 mg/dL (ref 8.4–10.5)
Creatinine, Ser: 1.18 mg/dL (ref 0.40–1.50)

## 2009-03-03 ENCOUNTER — Emergency Department (HOSPITAL_COMMUNITY): Admission: EM | Admit: 2009-03-03 | Discharge: 2009-03-03 | Payer: Self-pay | Admitting: Emergency Medicine

## 2009-03-03 ENCOUNTER — Encounter (INDEPENDENT_AMBULATORY_CARE_PROVIDER_SITE_OTHER): Payer: Self-pay | Admitting: *Deleted

## 2009-03-03 LAB — CONVERTED CEMR LAB
CO2: 27 meq/L
Glucose, Bld: 105 mg/dL
Potassium: 4 meq/L
Sodium: 138 meq/L

## 2009-03-10 ENCOUNTER — Encounter (INDEPENDENT_AMBULATORY_CARE_PROVIDER_SITE_OTHER): Payer: Self-pay | Admitting: *Deleted

## 2009-03-20 ENCOUNTER — Encounter: Payer: Self-pay | Admitting: Family Medicine

## 2009-03-20 ENCOUNTER — Encounter (INDEPENDENT_AMBULATORY_CARE_PROVIDER_SITE_OTHER): Payer: Self-pay | Admitting: *Deleted

## 2009-03-20 LAB — CONVERTED CEMR LAB
ALT: 18 units/L
Albumin: 3.9 g/dL (ref 3.5–5.2)
Alkaline Phosphatase: 77 units/L
BUN: 11 mg/dL
BUN: 11 mg/dL (ref 6–23)
Bilirubin, Direct: 0.1 mg/dL
Bilirubin, Direct: 0.1 mg/dL
CO2: 22 meq/L
Chloride: 107 meq/L (ref 96–112)
Cholesterol: 187 mg/dL
Cholesterol: 187 mg/dL
Creatinine, Ser: 0.99 mg/dL
Creatinine, Ser: 0.99 mg/dL
Eosinophils Relative: 6 % — ABNORMAL HIGH (ref 0–5)
Glucose, Bld: 91 mg/dL
Glucose, Bld: 91 mg/dL
HCT: 36.7 % — ABNORMAL LOW (ref 39.0–52.0)
HDL: 27 mg/dL — ABNORMAL LOW (ref >39)
Hemoglobin: 12.4 g/dL — ABNORMAL LOW (ref 13.0–17.0)
Indirect Bilirubin: 0.6 mg/dL (ref 0.0–0.9)
LDL Cholesterol: 123 mg/dL
LDL Cholesterol: 123 mg/dL — ABNORMAL HIGH (ref 0–99)
Lymphocytes Relative: 48 % — ABNORMAL HIGH (ref 12–46)
Lymphs Abs: 2.2 10*3/uL (ref 0.7–4.0)
Monocytes Absolute: 0.4 10*3/uL (ref 0.1–1.0)
Monocytes Relative: 9 % (ref 3–12)
Platelets: 125 10*3/uL — ABNORMAL LOW (ref 150–400)
Potassium: 4 meq/L (ref 3.5–5.3)
RBC: 3.7 M/uL — ABNORMAL LOW (ref 4.22–5.81)
Sodium: 143 meq/L
TSH: 2.92 microintl units/mL
Total Protein: 6 g/dL
Total Protein: 6 g/dL (ref 6.0–8.3)
Triglycerides: 183 mg/dL
Triglycerides: 183 mg/dL — ABNORMAL HIGH (ref <150)
VLDL: 37 mg/dL (ref 0–40)
WBC: 4.7 10*3/uL (ref 4.0–10.5)

## 2009-03-31 ENCOUNTER — Encounter (INDEPENDENT_AMBULATORY_CARE_PROVIDER_SITE_OTHER): Payer: Self-pay | Admitting: *Deleted

## 2009-04-02 ENCOUNTER — Ambulatory Visit: Payer: Self-pay | Admitting: Family Medicine

## 2009-04-07 ENCOUNTER — Telehealth (INDEPENDENT_AMBULATORY_CARE_PROVIDER_SITE_OTHER): Payer: Self-pay | Admitting: *Deleted

## 2009-04-17 ENCOUNTER — Encounter (INDEPENDENT_AMBULATORY_CARE_PROVIDER_SITE_OTHER): Payer: Self-pay | Admitting: *Deleted

## 2009-04-23 ENCOUNTER — Ambulatory Visit: Payer: Self-pay | Admitting: Cardiology

## 2009-04-23 DIAGNOSIS — Z954 Presence of other heart-valve replacement: Secondary | ICD-10-CM

## 2009-04-25 ENCOUNTER — Ambulatory Visit (HOSPITAL_COMMUNITY): Payer: Self-pay | Admitting: Oncology

## 2009-05-12 ENCOUNTER — Ambulatory Visit: Payer: Self-pay | Admitting: Family Medicine

## 2009-06-06 ENCOUNTER — Encounter (HOSPITAL_COMMUNITY): Admission: RE | Admit: 2009-06-06 | Discharge: 2009-07-06 | Payer: Self-pay | Admitting: Oncology

## 2009-06-25 ENCOUNTER — Ambulatory Visit (HOSPITAL_COMMUNITY): Admission: RE | Admit: 2009-06-25 | Discharge: 2009-06-25 | Payer: Self-pay | Admitting: Oncology

## 2009-06-30 ENCOUNTER — Ambulatory Visit (HOSPITAL_COMMUNITY): Payer: Self-pay | Admitting: Oncology

## 2009-06-30 ENCOUNTER — Encounter: Payer: Self-pay | Admitting: Family Medicine

## 2009-08-28 ENCOUNTER — Ambulatory Visit (HOSPITAL_COMMUNITY): Payer: Self-pay | Admitting: Oncology

## 2009-10-07 LAB — CONVERTED CEMR LAB
Albumin: 4.2 g/dL (ref 3.5–5.2)
Indirect Bilirubin: 0.7 mg/dL (ref 0.0–0.9)
LDL Cholesterol: 202 mg/dL — ABNORMAL HIGH (ref 0–99)
Total CHOL/HDL Ratio: 10
Total Protein: 6.1 g/dL (ref 6.0–8.3)
Triglycerides: 156 mg/dL — ABNORMAL HIGH (ref <150)
VLDL: 31 mg/dL (ref 0–40)

## 2009-10-30 ENCOUNTER — Ambulatory Visit: Payer: Self-pay | Admitting: Cardiology

## 2009-11-04 ENCOUNTER — Ambulatory Visit (HOSPITAL_COMMUNITY): Admission: RE | Admit: 2009-11-04 | Discharge: 2009-11-04 | Payer: Self-pay | Admitting: Cardiology

## 2009-11-04 ENCOUNTER — Ambulatory Visit: Payer: Self-pay | Admitting: Cardiology

## 2009-11-04 ENCOUNTER — Telehealth (INDEPENDENT_AMBULATORY_CARE_PROVIDER_SITE_OTHER): Payer: Self-pay | Admitting: *Deleted

## 2009-11-04 ENCOUNTER — Encounter: Payer: Self-pay | Admitting: Cardiology

## 2009-11-06 ENCOUNTER — Encounter (INDEPENDENT_AMBULATORY_CARE_PROVIDER_SITE_OTHER): Payer: Self-pay | Admitting: *Deleted

## 2009-11-07 ENCOUNTER — Ambulatory Visit (HOSPITAL_COMMUNITY): Payer: Self-pay | Admitting: Oncology

## 2009-12-26 ENCOUNTER — Ambulatory Visit (HOSPITAL_COMMUNITY): Payer: Self-pay | Admitting: Oncology

## 2009-12-26 ENCOUNTER — Encounter (HOSPITAL_COMMUNITY)
Admission: RE | Admit: 2009-12-26 | Discharge: 2010-01-28 | Payer: Self-pay | Source: Home / Self Care | Admitting: Oncology

## 2009-12-29 ENCOUNTER — Encounter: Payer: Self-pay | Admitting: Family Medicine

## 2009-12-29 ENCOUNTER — Ambulatory Visit (HOSPITAL_COMMUNITY): Payer: Self-pay | Admitting: Oncology

## 2010-01-02 ENCOUNTER — Ambulatory Visit: Payer: Self-pay | Admitting: Family Medicine

## 2010-02-05 ENCOUNTER — Ambulatory Visit: Payer: Self-pay | Admitting: Family Medicine

## 2010-02-06 ENCOUNTER — Encounter (HOSPITAL_COMMUNITY)
Admission: RE | Admit: 2010-02-06 | Discharge: 2010-03-08 | Payer: Self-pay | Source: Home / Self Care | Attending: Oncology | Admitting: Oncology

## 2010-02-10 DIAGNOSIS — E669 Obesity, unspecified: Secondary | ICD-10-CM

## 2010-03-19 ENCOUNTER — Encounter (HOSPITAL_COMMUNITY)
Admission: RE | Admit: 2010-03-19 | Discharge: 2010-04-18 | Payer: Self-pay | Source: Home / Self Care | Attending: Oncology | Admitting: Oncology

## 2010-03-19 ENCOUNTER — Ambulatory Visit (HOSPITAL_COMMUNITY): Payer: Self-pay | Admitting: Oncology

## 2010-03-23 ENCOUNTER — Encounter: Payer: Self-pay | Admitting: Family Medicine

## 2010-03-23 ENCOUNTER — Emergency Department (HOSPITAL_COMMUNITY)
Admission: EM | Admit: 2010-03-23 | Discharge: 2010-03-23 | Payer: Self-pay | Source: Home / Self Care | Admitting: Emergency Medicine

## 2010-04-20 ENCOUNTER — Encounter: Payer: Self-pay | Admitting: Thoracic Surgery (Cardiothoracic Vascular Surgery)

## 2010-04-28 NOTE — Miscellaneous (Signed)
Summary: labs bmp,lipid,liver 03/20/2009  Clinical Lists Changes  Observations: Added new observation of CALCIUM: 8.4 mg/dL (16/12/9602 5:40) Added new observation of ALBUMIN: 3.9 g/dL (98/01/9146 8:29) Added new observation of PROTEIN, TOT: 6.0 g/dL (56/21/3086 5:78) Added new observation of SGPT (ALT): 18 units/L (03/20/2009 9:15) Added new observation of SGOT (AST): 19 units/L (03/20/2009 9:15) Added new observation of ALK PHOS: 77 units/L (03/20/2009 9:15) Added new observation of BILI DIRECT: 0.1 mg/dL (46/96/2952 8:41) Added new observation of CREATININE: 0.99 mg/dL (32/44/0102 7:25) Added new observation of BUN: 11 mg/dL (36/64/4034 7:42) Added new observation of BG RANDOM: 91 mg/dL (59/56/3875 6:43) Added new observation of CO2 PLSM/SER: 22 meq/L (03/20/2009 9:15) Added new observation of CL SERUM: 107 meq/L (03/20/2009 9:15) Added new observation of K SERUM: 4.0 meq/L (03/20/2009 9:15) Added new observation of NA: 143 meq/L (03/20/2009 9:15) Added new observation of LDL: 123 mg/dL (32/95/1884 1:66) Added new observation of HDL: 27 mg/dL (09/26/1599 0:93) Added new observation of TRIGLYC TOT: 183 mg/dL (23/55/7322 0:25) Added new observation of CHOLESTEROL: 187 mg/dL (42/70/6237 6:28) Added new observation of TSH: 2.920 microintl units/mL (03/20/2009 9:15)

## 2010-04-28 NOTE — Progress Notes (Signed)
Summary: order for echo   Phone Note From Other Clinic   Caller: nurse radiology Summary of Call: Per Radiology need order for echo on this patient for appt today.  fax [phone] Initial call taken by: Edman Circle,  November 04, 2009 8:34 AM  Follow-up for Phone Call        order faxed  Follow-up by: Teressa Lower RN,  November 04, 2009 9:24 AM

## 2010-04-28 NOTE — Letter (Signed)
Summary: [place] [facility], agricultural at [employer]  [address] [place] [address]   Phone: [phone]  Fax: [phone]     April 23, 2009 MRN: [id number]   [name] [address] [place], [place]  [address]      YOUR LAB WORK IS DUE  October 06, 2009 _________________________________________  Please go to [facility], located across the street from [facility] on the second floor.  Hours are Monday - Friday 7am until 7:30pm         Saturday 8am until 12noon    _X_  DO NOT EAT OR DRINK AFTER MIDNIGHT EVENING PRIOR TO LABWORK  __ YOUR LABWORK IS NOT FASTING --YOU MAY EAT PRIOR TO LABWORK

## 2010-04-28 NOTE — Assessment & Plan Note (Signed)
Summary: office visit   Vital Signs:  Patient profile:   65 year old male Height:      70 inches Weight:      249.25 pounds BMI:     35.89 O2 Sat:      95 % on Room air Pulse rate:   71 / minute Pulse rhythm:   regular Resp:     16 per minute BP sitting:   110 / 68  (left arm)  Vitals Entered By: Worthy Keeler LPN (April 02, 2009 4:00 PM)  Nutrition Counseling: Patient's BMI is greater than 25 and therefore counseled on weight management options.  O2 Flow:  Room air CC: follow-up visit Is Patient Diabetic? No Pain Assessment Patient in pain? no        Primary Care Provider:  [name], MD  CC:  follow-up visit.  History of Present Illness: Reports  that he has been doing fairly  well. Denies recent fever or chills. Denies sinus pressure, nasal congestion , ear pain or sore throat. Denies chest congestion, or cough productive of sputum. Denies chest pain, palpitations, PND, orthopnea or leg swelling. Denies abdominal pain, nausea, vomitting, diarrhea or constipation. Denies change in bowel movements or bloody stool. Denies dysuria , frequency, incontinence or hesitancy. Denies  joint pain, swelling, or reduced mobility. Denies headaches, vertigo, seizures. He reports depression and anxiety. He is having difficulty getting benefits from [employer], and this is causing increased economic strain. Denies  rash, lesions, or itch.     Allergies (verified): No Known Drug Allergies  Review of Systems      See HPI GI:  Complains of abdominal pain, diarrhea, nausea, and vomiting; stomach virus thiss past wekend  which has resolved.  Physical Exam  General:  Well-developed,obese,in no acute distress; alert,appropriate and cooperative throughout examination .HEENT: No facial asymmetry,  EOMI, No sinus tenderness, TM's Clear, oropharynx  pink and moist.   Chest: Clear to auscultation bilaterally.  CVS: S1, S2, No murmurs, No S3.   Abd: Soft,obese,   Nontender.  MS: Adequate ROM spine, hips, shoulders and knees.  Ext: No edema.   CNS: CN 2-12 intact, power tone and sensation normal throughout.   Skin: Intact, no visible lesions or rashes.  Psych: Good eye contact, normal affect.  Memory intact, not anxious or depressed appearing.    Impression & Recommendations:  Problem # 1:  MORBID OBESITY (ICD-278.01) Assessment Improved  Ht: 70 (04/02/2009)   Wt: 249.25 (04/02/2009)   BMI: 35.89 (04/02/2009)  Problem # 2:  CLL (ICD-204.10) Assessment: Unchanged  Problem # 3:  HYPERLIPIDEMIA (ICD-272.4) Assessment: Unchanged  His updated medication list for this problem includes:    Crestor 40 Mg Tabs (Rosuvastatin calcium) .Marland Kitchen... 1 tab once daily  Labs Reviewed: SGOT: 19 (03/20/2009)   SGPT: 18 (03/20/2009)   HDL:27 (03/20/2009), 27 (03/20/2009)  LDL:123 (03/20/2009), 123 (03/20/2009)  Chol:187 (03/20/2009), 187 (03/20/2009)  Trig:183 (03/20/2009), 183 (03/20/2009)  Problem # 4:  HODGKIN'S DISEASE (ICD-201.90) Assessment: Unchanged followed by oncology  Problem # 5:  CORONARY ATHEROSCLEROSIS NATIVE CORONARY ARTERY (ICD-414.01) Assessment: Unchanged  His updated medication list for this problem includes:    Lasix 40 Mg Tabs (Furosemide) .Marland Kitchen... 1/2 tab as needed    Metoprolol Tartrate 50 Mg Tabs (Metoprolol tartrate) .Marland Kitchen... 1 tab two times a day    Aspirin 81 Mg Tbec (Aspirin) .Marland Kitchen... Take one tablet by mouth daily  Complete Medication List: 1)  Acyclovir 200 Mg Caps (Acyclovir) .... Take one tablet by mouth twice  a day 2)  Folic Acid 1 Mg Tabs (Folic acid) .... Take one tablet by mouth once a dy 3)  Multivitamins Tabs (Multiple vitamin) .... Take one tablet by mouth once aday 4)  Lasix 40 Mg Tabs (Furosemide) .... 1/2 tab as needed 5)  Metoprolol Tartrate 50 Mg Tabs (Metoprolol tartrate) .Marland Kitchen.. 1 tab two times a day 6)  Aspirin 81 Mg Tbec (Aspirin) .... Take one tablet by mouth daily 7)  Crestor 40 Mg Tabs (Rosuvastatin calcium) .Marland Kitchen.. 1  tab once daily 8)  Fish Oil 1000 Mg Caps (Omega-3 fatty acids) .... 2 tabs once daily 9)  Septra Ds 800-160 Mg Tabs (Sulfamethoxazole-trimethoprim) .... Take 1 tablet by mouth two times a day 10)  Klor-con M20 20 Meq Cr-tabs (Potassium chloride crys cr) .... Take 1 tablet by mouth once a day  Patient Instructions: 1)  Please schedule a follow-up appointment in 4 months. 2)  It is important that you exercise regularly at least 20 minutes 5 times a week. If you develop chest pain, have severe difficulty breathing, or feel very tired , stop exercising immediately and seek medical attention. 3)  You need to lose weight. Consider a lower calorie diet and regular exercise. congrats on the weight you have lost, pls keep it up. 4)  your cholesterol has gone up a  little bit, pls cut back on your fried and fatty foods.

## 2010-04-28 NOTE — Letter (Signed)
Summary: [place] [facility], agricultural at [employer]  [address], [place] [address]   Phone: [phone]  Fax: [phone]     October 30, 2009 MRN: [id number]   [name] [address] [place], [place]  [address]      YOUR LAB WORK IS DUE  April 27, 2010 _________________________________________  Please go to [facility], located across the street from [facility] on the second floor.  Hours are Monday - Friday 7am until 7:30pm         Saturday 8am until 12noon    _X_  DO NOT EAT OR DRINK AFTER MIDNIGHT EVENING PRIOR TO LABWORK  __ YOUR LABWORK IS NOT FASTING --YOU MAY EAT PRIOR TO LABWORK

## 2010-04-28 NOTE — Assessment & Plan Note (Signed)
Summary: 6 mth f/u per checkout on 04/23/09/tg      Allergies Added: NKDA  Visit Type:  Follow-up Primary Provider:  Dr. [name]   History of Present Illness: 65 year old male presents for followup. He was last seen back in January. No reported angina or progressive shortness of breath. He remains fairly inactive. Has chronic problems with lower extremity edema and some venous stasis.  Followup labs from July showed cholesterol 259, triglycerides 156, HDL 26, and LDL 202. Patient had not been compliant with Crestor. He states he runs out of the medication, and also sometimes forgets to take it before bedtime.  Patient due for a followup echocardiogram to assess aortic valve replacement.  Clinical Review Panels:  Echocardiogram Echocardiogram  SUMMARY   -  The left ventricle was mildly dilated. Overall left ventricular         systolic function was normal. Left ventricular ejection         fraction was estimated to be 55 %. Left ventricular wall         thickness was moderately increased.   -  Cannot tell if valve is tri-leaflet or not. Morphologically         abnormal The aortic valve was moderately calcified. Findings         were consistent with severe aortic valve stenosis. There was         mild aortic valvular regurgitation. The mean transaortic         valve gradient was 55 mmHg. Estimated aortic valve area (by         VTI) was 0.87 cm^2. Estimated aortic valve area (by Vmax) was         0.82 cm^2.   -  There was mild mitral valvular regurgitation.   -  The left atrium was mildly dilated.   -  Small posterior effusion   -  Patient will see me to discuss right and left heart cath.    IMPRESSIONS   -  Patient will see me to discuss right and left heart cath.     ---------------------------------------------------------------    Prepared and Electronically Authenticated by    Charlton Haws M.D. (08/18/2007)    Current Medications (verified): 1)  Acyclovir  200 Mg  Caps (Acyclovir) .... Take One Tablet By Mouth Twice A Day 2)  Folic Acid 1 Mg  Tabs (Folic Acid) .... Take One Tablet By Mouth Once A Dy 3)  Multivitamins   Tabs (Multiple Vitamin) .... Take One Tablet By Mouth Once Aday 4)  Lasix 40 Mg Tabs (Furosemide) .... Take 1/2 Tablet By Mouth Every Other Day. 5)  Metoprolol Tartrate 50 Mg Tabs (Metoprolol Tartrate) .Marland Kitchen.. 1 Tab Two Times A Day 6)  Aspirin 81 Mg Tbec (Aspirin) .... Take One Tablet By Mouth Daily 7)  Crestor 40 Mg Tabs (Rosuvastatin Calcium) .Marland Kitchen.. 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 HEENT: Conjunctiva and lids normal, oropharynx with poor dentition. Neck: Supple, no elevated jugular venous pressure or bruits. Lungs: Clear to auscultation, nonlabored. Cardiac: Regular rate and rhythm, S4, soft systolic murmur at the base, no S3. Abdomen: Obese, nontender, bowel sounds present. Extremities: Venous stasis noted, 1-2+ edema, distal pulses diminished. Skin: Warm and dry, scattered excoriations. Musculoskeletal: No gross deformities. Neuropsychiatric: Alert and oriented x3, somewhat blunted affect.   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 This will be arranged.  Problem # 2:  HYPERLIPIDEMIA (ICD-272.4)  Not well-controlled based on recent assessment, complicated by medication noncompliance. I reviewed this with him today. Samples were provided. He will try to start taking this along with some of his other regular medications, hopefully to help him not forget. We will reassess lipid profile liver function tests prior to his next visit.  His updated medication list for this problem includes:    Crestor 40 Mg Tabs (Rosuvastatin calcium) .Marland Kitchen... 1 tab once daily  Future Orders: T-Lipid Profile ([id number]) ... 04/27/2010 T-Hepatic Function [id number]) ... 04/27/2010  Problem # 3:  CORONARY ATHEROSCLEROSIS NATIVE CORONARY ARTERY (ICD-414.01)  No active anginal symptoms.  His updated medication list for this problem includes:    Metoprolol Tartrate 50 Mg Tabs (Metoprolol tartrate) .Marland Kitchen... 1 tab two times a day    Aspirin 81 Mg Tbec (Aspirin) .Marland Kitchen... Take one tablet by mouth daily  Other Orders: 2-D Echocardiogram (2D Echo)  Patient Instructions: 1)  Your physician recommends that you schedule a follow-up appointment in: 6 months 2)  Your physician recommends that you return for lab work in: 6 months, just before next visit. 3)  Your physician recommends that you continue on your current medications as directed. Please refer to the Current Medication list given to you today. 4)  Your physician has requested that you have an echocardiogram.  Echocardiography is a painless test that uses sound waves to create images of your heart. It provides your doctor with information about the size and shape of your heart and how well your heart's chambers and valves are working.  This procedure takes approximately one hour. There are no restrictions for this procedure.

## 2010-04-28 NOTE — Miscellaneous (Signed)
Summary: hosp labs 03/02/09-03/03/2009  Clinical Lists Changes  Observations: Added new observation of CALCIUM: 8.5 mg/dL (94/17/4081 44:81) Added new observation of GFR AA: >60 mL/min/1.73m2 (03/03/2009 10:32) Added new observation of GFR: >60 mL/min (03/03/2009 10:32) Added new observation of CREATININE: 1.10 mg/dL (85/63/1497 02:63) Added new observation of BUN: 13 mg/dL (78/58/8502 77:41) Added new observation of BG RANDOM: 105 mg/dL (28/78/6767 20:94) Added new observation of CO2 PLSM/SER: 27 meq/L (03/03/2009 10:32) Added new observation of CL SERUM: 105 meq/L (03/03/2009 10:32) Added new observation of K SERUM: 4.0 meq/L (03/03/2009 10:32) Added new observation of NA: 138 meq/L (03/03/2009 10:32)

## 2010-04-28 NOTE — Miscellaneous (Signed)
Summary: LABS BMP,LIPID,LIVER 03/20/09  Clinical Lists Changes  Observations: Added new observation of CALCIUM: 8.4 mg/dL (16/12/9602 54:09) Added new observation of ALBUMIN: 3.9 g/dL (81/19/1478 29:56) Added new observation of PROTEIN, TOT: 6.0 g/dL (21/30/8657 84:69) Added new observation of SGPT (ALT): 18 units/L (03/20/2009 15:16) Added new observation of SGOT (AST): 19 units/L (03/20/2009 15:16) Added new observation of ALK PHOS: 77 units/L (03/20/2009 15:16) Added new observation of BILI DIRECT: 0.1 mg/dL (62/95/2841 32:44) Added new observation of CREATININE: 0.99 mg/dL (03/31/7251 66:44) Added new observation of BUN: 11 mg/dL (03/47/4259 56:38) Added new observation of BG RANDOM: 91 mg/dL (75/64/3329 51:88) Added new observation of CO2 PLSM/SER: 22 meq/L (03/20/2009 15:16) Added new observation of CL SERUM: 107 meq/L (03/20/2009 15:16) Added new observation of K SERUM: 4.0 meq/L (03/20/2009 15:16) Added new observation of NA: 143 meq/L (03/20/2009 15:16) Added new observation of LDL: 123 mg/dL (41/66/0630 16:01) Added new observation of HDL: 27 mg/dL (09/32/3557 32:20) Added new observation of TRIGLYC TOT: 183 mg/dL (25/42/7062 37:62) Added new observation of CHOLESTEROL: 187 mg/dL (83/15/1761 60:73) Added new observation of TSH: 2.920 microintl units/mL (03/20/2009 15:16)

## 2010-04-28 NOTE — Letter (Signed)
Summary: [facility]  [facility]   Imported By: Lind Guest 01/09/2010 09:10:14  _____________________________________________________________________  External Attachment:    Type:   Image     Comment:   External Document

## 2010-04-28 NOTE — Assessment & Plan Note (Signed)
Summary: office visit   Vital Signs:  Patient profile:   65 year old male Height:      70 inches Weight:      245.75 pounds Pulse rhythm:   regular BP sitting:   100 / 70  (right arm)  Vitals Entered By: Mauricia Area CMA (February 05, 2010 11:33 AM) CC: follow up Comments Did not bring meds   Primary Care Provider:  Dr. [name]  CC:  follow up.  History of Present Illness: Reports  thathe has been doing fairly well. 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 he continues to follow closely with both oncology and hematology. Denies recent fever or chills. Denies sinus pressure, nasal congestion , ear pain or sore throat. Denies chest congestion, or cough productive of sputum. Denies chest pain, palpitations, PND, orthopnea or leg swelling. Denies abdominal pain, nausea, vomitting, diarrhea or constipation. Denies change in bowel movements or bloody stool. Denies dysuria , frequency, incontinence or hesitancy.  Denies headaches, vertigo, seizures.  Denies  rash, lesions, or itch.     Allergies (verified): No Known Drug Allergies  Review of Systems      See HPI General:  Complains of fatigue. Eyes:  Denies blurring and discharge. ENT:  Denies hoarseness, nasal congestion, postnasal drainage, ringing in ears, and sinus pressure. CV:  Denies chest pain or discomfort, palpitations, and swelling of feet. GU:  Complains of erectile dysfunction; recently had urologic opinion re poor stream, no surgical options for this. MS:  Complains of joint pain, low back pain, muscle aches, and stiffness. Psych:  Complains of anxiety and depression; denies mental problems, sense of great danger, suicidal thoughts/plans, thoughts of violence, and unusual visions or sounds; mild symptoms. Endo:  Denies cold intolerance, excessive hunger, excessive thirst, and excessive urination. Heme:  Denies  abnormal bruising and bleeding. Allergy:  Complains of seasonal allergies; denies hives or rash and itching eyes.  Physical Exam  General:  Well-developed,obese,in no acute distress; alert,appropriate and cooperative throughout examination  HEENT: No facial asymmetry,  EOMI, No sinus tenderness, TM's Clear, oropharynx  pink and moist.   Chest: Clear to auscultation bilaterally. decreased air entry bilaterally CVS: S1, S2, systolic murmur, no s3  Abd: Soft, Nontender. obese MSdecreased  ROM spine, hips, shoulders and knees.  Ext: No edema.   CNS: CN 2-12 intact, power tone and sensation normal throughout.   Skin: Intact, no visible lesions or rashes.  Psych: Good eye contact, normal affect.  Memory intact, not anxious or depressed appearing.    Impression & Recommendations:  Problem # 1:  cOBESITY (ICD-278.00) Assessment Unchanged  Ht: 70 (02/05/2010)   Wt: 245.75 (02/05/2010)   BMI: 35.35 (01/02/2010)  therapeutic lifestyle change discussed and encouraged  Problem # 2:  HYPERLIPIDEMIA (ICD-272.4) Assessment: Comment Only  The following medications were removed from the medication list:    Crestor 40 Mg Tabs (Rosuvastatin calcium) .Marland Kitchen... 1 tab once daily His updated medication list for this problem includes:    Crestor 20 Mg Tabs (Rosuvastatin calcium) .Marland Kitchen... 2 tablets at bedtime  Orders: T-Hepatic Function [id number]) T-Lipid Profile [id number]) Low fat dietdiscussed and encouraged  Labs Reviewed: SGOT: 18 (10/06/2009)   SGPT: 14 (10/06/2009)   HDL:26 (10/06/2009), 27 (03/20/2009)  LDL:202 (10/06/2009), 123 (03/20/2009)  Chol:259 (10/06/2009), 187 (03/20/2009)  Trig:156 (10/06/2009), 183 (03/20/2009)  Problem # 3:  HODGKIN'S DISEASE (ICD-201.90)  Complete Medication List: 1)  Acyclovir  200 Mg Caps (Acyclovir) .... Take one tablet by mouth twice a day 2)  Folic Acid 1 Mg Tabs (Folic acid) .... Take one tablet by mouth once a dy 3)  Multivitamins Tabs (Multiple  vitamin) .... Take one tablet by mouth once aday 4)  Metoprolol Tartrate 50 Mg Tabs (Metoprolol tartrate) .Marland Kitchen.. 1 tab two times a day 5)  Aspirin 81 Mg Tbec (Aspirin) .... Take one tablet by mouth daily 6)  Crestor 20 Mg Tabs (Rosuvastatin calcium) .... 2 tablets at bedtime  Other Orders: T-Basic Metabolic Panel [id number]) Influenza Vaccine MCR [id number])  Patient Instructions: 1)  Please schedule a follow-up appointment in 4 months. 2)  I am happy that your cold is better, and  you will receive a flu vaccine today. 3)  You will get samples of crestor 20mg  take two every night.We will also try to get more for you 4)  BMP prior to visit, ICD-9: 5)  Hepatic Panel prior to visit, ICD-9:   fasting labs in 4 months 6)  Lipid Panel prior to visit, ICD-9:    Orders Added: 1)  Est. Patient Level IV [91478] 2)  T-Basic Metabolic Panel [[id number]] 3)  T-Hepatic Function [[id number]] 4)  T-Lipid Profile [[id number]] 5)  Influenza Vaccine MCR [00025]   Immunizations Administered:  Influenza Vaccine:    Vaccine Type: Fluvax MCR    Site: left deltoid    Mfr: novartis    Dose: 0.5 ml    Route: IM    Given by: Mauricia Area CMA    Exp. Date: 07/2010    Lot #: [id number]    VIS given: 10/21/09 version given February 05, 2010.   Immunizations Administered:  Influenza Vaccine:    Vaccine Type: Fluvax MCR    Site: left deltoid    Mfr: novartis    Dose: 0.5 ml    Route: IM    Given by: Mauricia Area CMA    Exp. Date: 07/2010    Lot #: [id number]    VIS given: 10/21/09 version given February 05, 2010.

## 2010-04-28 NOTE — Letter (Signed)
Summary: [place] Results Engineer, agricultural at [facility]  [address] [place] [address]   Phone: [phone]  Fax: [phone]      November 06, 2009 MRN: [id number]   [name] [address] [place], [place]  [address]   Dear [name],  Your test ordered by Selena Batten has been reviewed by your physician (or physician assistant) and was found to be normal or stable. Your physician (or physician assistant) felt no changes were needed at this time.  __x__ Echocardiogram  ____ Cardiac Stress Test  ____ Lab Work  ____ Peripheral vascular study of arms, legs or neck  ____ CT scan or X-ray  ____ Lung or Breathing test  ____ Other:  No change in medical treatment at this time,per Dr. Diona Browner.  Stable aortic prothesis.  Thank you, Tammy Allyne Gee RN    [place] Bing, MD, Lenise Arena.C.Gaylord Shih, MD, F.A.C.C Lewayne Bunting, MD, F.A.C.C Nona Dell, MD, F.A.C.C Charlton Haws, MD, Lenise Arena.C.C

## 2010-04-28 NOTE — Progress Notes (Signed)
Summary: OUT OF MEDS   Phone Note Call from Patient Call back at Home Phone [phone]   Caller: [facility] Reason for Call: Refill Medication Summary of Call: PER [facility] NEEDS METOPROLOL 50MG  TO [facility] IN [place]. PT IS OUT OF MEDS Initial call taken by: Faythe Ghee,  April 07, 2009 12:38 PM    Prescriptions: METOPROLOL TARTRATE 50 MG TABS (METOPROLOL TARTRATE) 1 tab two times a day  #60 x 0   Entered by:   Teressa Lower RN   Authorized by:   Loreli Slot, MD, [facility]   Signed by:   Teressa Lower RN on 04/07/2009   Method used:   Electronically to        [facility]  [address] (retail)       [address]       [place], [place]  [address]       Ph: [phone]       Fax: [phone]   RxID:   [id number]

## 2010-04-28 NOTE — Assessment & Plan Note (Signed)
Summary: f55m  Medications Added LASIX 40 MG TABS (FUROSEMIDE) Take 1/2 tablet by mouth every other day.      Allergies Added: NKDA  Visit Type:  Follow-up Primary Provider:  [name], MD   History of Present Illness: 65 year old male presents for a followup visit. He denies any problems with recurrent angina. He remains overweight with NYHA class II dyspnea and exertion. We discussed efforts at weight loss and diet. Reports being under a lot of financial stress.  Recent labs from 23 December reveal an AST of 19, ALT 18, BUN 11, creatinine 0.9, potassium 4.0, LDL 123, HDL 27, cholesterol is 183, total cholesterol 187, TSH 2.9.  Mr. Bertha states that he is "pretty good" in taking his medication, but forgets at times. This may be related to his suboptimal LDL control.  He does indicate lower extremity edema, right worse than left, and has visible venous stasis on exam. He has not been taking his Lasix with any regularity.  Current Medications (verified): 1)  Acyclovir 200 Mg  Caps (Acyclovir) .... Take One Tablet By Mouth Twice A Day 2)  Folic Acid 1 Mg  Tabs (Folic Acid) .... Take One Tablet By Mouth Once A Dy 3)  Multivitamins   Tabs (Multiple Vitamin) .... Take One Tablet By Mouth Once Aday 4)  Lasix 40 Mg Tabs (Furosemide) .... Take 1/2 Tablet By Mouth Every Other Day. 5)  Metoprolol Tartrate 50 Mg Tabs (Metoprolol Tartrate) .Marland Kitchen.. 1 Tab Two Times A Day 6)  Aspirin 81 Mg Tbec (Aspirin) .... Take One Tablet By Mouth Daily 7)  Crestor 40 Mg Tabs (Rosuvastatin Calcium) .Marland Kitchen.. 1 Tab Once Daily 8)  Fish Oil 1000 Mg Caps (Omega-3 Fatty Acids) .... 2 Tabs Once Daily  Allergies (verified): No Known Drug Allergies  Past History:  Past Medical History: Last updated: 03/31/2009 CLL Hodgkin's disease Aortic Stenosis Atrial Fibrillation CAD Hyperlipidemia Hypertension  Past Surgical History: Last updated: 03/31/2009 Eye surgery CABG (SVG to RCA) 10/09 Valve  Replacement-Aortic (23mm Magna pericardial) 10/09  Social History: Last updated: 03/31/2009 Pt works in the [employer] Married  2 children Denies tobacco Denies alcohol or street drug use  Review of Systems       The patient complains of peripheral edema.  The patient denies anorexia, weight loss, chest pain, syncope, prolonged cough, headaches, hemoptysis, abdominal pain, melena, and hematochezia.         Otherwise reviewed and negative.  Vital Signs:  Patient profile:   65 year old male Weight:      247 pounds Pulse rate:   75 / minute BP sitting:   127 / 75  (right arm)  Vitals Entered By: Dreama Saa, CNA (April 23, 2009 1:16 PM)  Physical Exam  Additional Exam:  Morbidly obese male in no acute distress. HEENT: Conjunctiva and lids normal, oropharynx with poor dentition. Neck: Supple, no elevated jugular venous pressure or bruits. Lungs: Clear to auscultation, nonlabored. Cardiac: Regular rate and rhythm, S4, soft systolic murmur at the base, no S3. Abdomen: Obese, nontender, bowel sounds present. Extremities: Venous stasis noted, 1-2+ edema, distal pulses diminished. Skin: Warm and dry, scattered excoriations. Musculoskeletal: No gross deformities. Neuropsychiatric: Alert and oriented x3, somewhat blunted affect.   EKG  Procedure date:  04/23/2009  Findings:      Normal sinus rhythm at 72 beats per minute with chronic left bundle-branch block.  Impression & Recommendations:  Problem # 1:  CORONARY ATHEROSCLEROSIS NATIVE CORONARY ARTERY (ICD-414.01)  Stable symptomatically without active angina. Continue  present medications. Six-month followup is planned.  His updated medication list for this problem includes:    Metoprolol Tartrate 50 Mg Tabs (Metoprolol tartrate) .Marland Kitchen... 1 tab two times a day    Aspirin 81 Mg Tbec (Aspirin) .Marland Kitchen... Take one tablet by mouth daily  Problem # 2:  AORTIC VALVE REPLACEMENT, HX OF (ICD-V43.3)  Status post aortic valve  replacement due to severe aortic stenosis, using a 23 mm magna pericardial tissue valve. Surgery was in October 2009. Will plan a followup 2-D echocardiogram some time over the next year.  Problem # 3:  HYPERLIPIDEMIA (ICD-272.4)  LDL better compared to prior levels, although clearly not optimal. I discussed this with him today. He remains overweight and we reviewed diet modifications. He also does not appear to be taking his Crestor completely regularly. He states he will try to pay more attention to both of these issues, and will follow up with labs in 6 months.  His updated medication list for this problem includes:    Crestor 40 Mg Tabs (Rosuvastatin calcium) .Marland Kitchen... 1 tab once daily  Future Orders: T-Hepatic Function [id number]) ... 10/06/2009 T-Lipid Profile [phone]) ... 10/06/2009  Problem # 4:  ATRIAL FIBRILLATION (ICD-427.31)  History of postoperative atrial fibrillation. This issue has been quiescent. He has been off of amiodarone and Coumadin for some time now.  His updated medication list for this problem includes:    Metoprolol Tartrate 50 Mg Tabs (Metoprolol tartrate) .Marland Kitchen... 1 tab two times a day    Aspirin 81 Mg Tbec (Aspirin) .Marland Kitchen... Take one tablet by mouth daily  Problem # 5:  EDEMA (ICD-782.3)  Edema noted with venous stasis status post vein harvesting. I asked him to try taking his Lasix on an every other day basis.  Patient Instructions: 1)  Your physician recommends that you schedule a follow-up appointment in: 6 months 2)  Your physician recommends that you return for lab work in: 6 months 3)  Your physician has recommended you make the following change in your medication:Start taking 1/2 tablet of Lasix 40mg  by mouth every other day.  Prescriptions: LASIX 40 MG TABS (FUROSEMIDE) Take 1/2 tablet by mouth every other day.  #15 x 6   Entered by:   Larita Fife Via LPN   Authorized by:   Loreli Slot, MD, [facility]   Signed by:   Larita Fife Via LPN on 60/63/0160    Method used:   Electronically to        [facility]  [address] (retail)       [address]       [place], [place]  [address]       Ph: [phone]       Fax: [phone]   RxID:   [id number]

## 2010-04-28 NOTE — Assessment & Plan Note (Signed)
Summary: flu maybe   Vital Signs:  Patient profile:   65 year old male Height:      70 inches Weight:      248.25 pounds O2 Sat:      93 % on Room air Pulse rate:   78 / minute Resp:     16 per minute BP sitting:   97 / 65  (left arm)  Vitals Entered By: Lilyan Gilford LPN (May 12, 2009 10:18 AM) CC: body aches, chills, cough, head congestion, x 3 days Is Patient Diabetic? No Pain Assessment Patient in pain? no        Primary Provider:  [name], MD  CC:  body aches, chills, cough, head congestion, and x 3 days.  History of Present Illness: Pt c/o 3 days of clear nasal congestion, post nasal drainage, and cough.  His cough is primarily nonproductive, but occasionally is productive with clear phlegm. He also c/o body aches.  Feels feverish off & on, and chills.  He is using OTC Aleve for body aches, which helps some.    Pt is requesting a shot, like he has received before to help with the body aches.  Current Medications (verified): 1)  Acyclovir 200 Mg  Caps (Acyclovir) .... Take One Tablet By Mouth Twice A Day 2)  Folic Acid 1 Mg  Tabs (Folic Acid) .... Take One Tablet By Mouth Once A Dy 3)  Multivitamins   Tabs (Multiple Vitamin) .... Take One Tablet By Mouth Once Aday 4)  Lasix 40 Mg Tabs (Furosemide) .... Take 1/2 Tablet By Mouth Every Other Day. 5)  Metoprolol Tartrate 50 Mg Tabs (Metoprolol Tartrate) .Marland Kitchen.. 1 Tab Two Times A Day 6)  Aspirin 81 Mg Tbec (Aspirin) .... Take One Tablet By Mouth Daily 7)  Crestor 40 Mg Tabs (Rosuvastatin Calcium) .Marland Kitchen.. 1 Tab Once Daily 8)  Fish Oil 1000 Mg Caps (Omega-3 Fatty Acids) .... 2 Tabs Once Daily  Allergies (verified): No Known Drug Allergies  Past History:  Past medical, surgical, family and social histories (including risk factors) reviewed for relevance to current acute and chronic problems.  Past Medical History: Reviewed history from 03/31/2009 and no changes required. CLL Hodgkin's disease Aortic  Stenosis Atrial Fibrillation CAD Hyperlipidemia Hypertension  Past Surgical History: Reviewed history from 03/31/2009 and no changes required. Eye surgery CABG (SVG to RCA) 10/09 Valve Replacement-Aortic (23mm Magna pericardial) 10/09  Family History: Reviewed history from 03/31/2009 and no changes required. Mother living - legally blind, has had heart surgery Father - died at 91 of lung cancer 1 sister living age 34, health unknown 2 brothers living and 2 deceased, one at 54 of heart attack.  Social History: Reviewed history from 03/31/2009 and no changes required. Pt works in [employer] Married  2 children Denies tobacco Denies alcohol or street drug use  Review of Systems General:  Complains of chills, fatigue, and malaise; denies fever, loss of appetite, sleep disorder, sweats, and weakness. ENT:  Complains of nasal congestion and postnasal drainage; denies earache, hoarseness, sinus pressure, and sore throat. CV:  Denies chest pain or discomfort and difficulty breathing at night. Resp:  Complains of cough and sputum productive; denies chest discomfort, coughing up blood, and shortness of breath; occ productive with clear sputum. GI:  Denies abdominal pain, nausea, and vomiting. MS:  Complains of muscle aches. Heme:  Denies enlarge lymph nodes.  Physical Exam  General:  Well-developed,well-nourished,in no acute distress; alert,appropriate and cooperative throughout examination Head:  Normocephalic and atraumatic  without obvious abnormalities.  Eyes:  pupils equal, pupils round, and no injection.   Ears:  External ear exam shows no significant lesions or deformities.  Otoscopic examination reveals clear canals, tympanic membranes are intact bilaterally without bulging, retraction, inflammation or discharge. Hearing is grossly normal bilaterally. Nose:  External nasal examination shows no deformity or inflammation. Nasal mucosa are pink and moist without lesions or  exudates. Mouth:  Oral mucosa and oropharynx without lesions or exudates.  Neck:  No deformities, masses, or tenderness noted. Lungs:  Normal respiratory effort, chest expands symmetrically. Lungs are clear to auscultation, no crackles or wheezes. Heart:  Normal rate and regular rhythm. S1 and S2 normal without gallop, murmur, click, rub or other extra sounds. Cervical Nodes:  No lymphadenopathy noted Psych:  Cognition and judgment appear intact. Alert and cooperative with normal attention span and concentration. No apparent delusions, illusions, hallucinations   Impression & Recommendations:  Problem # 1:  VIREMIA (ICD-790.8)  Orders: Admin of Therapeutic Inj  intramuscular or subcutaneous (10272)  Complete Medication List: 1)  Acyclovir 200 Mg Caps (Acyclovir) .... Take one tablet by mouth twice a day 2)  Folic Acid 1 Mg Tabs (Folic acid) .... Take one tablet by mouth once a dy 3)  Multivitamins Tabs (Multiple vitamin) .... Take one tablet by mouth once aday 4)  Lasix 40 Mg Tabs (Furosemide) .... Take 1/2 tablet by mouth every other day. 5)  Metoprolol Tartrate 50 Mg Tabs (Metoprolol tartrate) .Marland Kitchen.. 1 tab two times a day 6)  Aspirin 81 Mg Tbec (Aspirin) .... Take one tablet by mouth daily 7)  Crestor 40 Mg Tabs (Rosuvastatin calcium) .Marland Kitchen.. 1 tab once daily 8)  Fish Oil 1000 Mg Caps (Omega-3 fatty acids) .... 2 tabs once daily 9)  Promethazine-codeine 6.25-10 Mg/78ml Syrp (Promethazine-codeine) .Marland Kitchen.. 1-2 tsp q 6 hrs as needed cough  Other Orders: Ketorolac-Toradol 15mg  (Z3664)  Patient Instructions: 1)  Get plenty of rest, drink lots of clear liquids, and use Tylenol or Ibuprofen for fever and comfort. Return in 7-10 days if you're not better:sooner if you're feeling worse.  You may continue Aleve in place of Tylenol or Ibuprofen. 2)  Please schedule a follow-up appointment as needed. 3)  Your prescription cough syrup will be faxed to the pharmacy.    Prescriptions: PROMETHAZINE-CODEINE 6.25-10 MG/5ML SYRP (PROMETHAZINE-CODEINE) 1-2 tsp q 6 hrs as needed cough  #4 oz x 0   Entered and Authorized by:   Esperanza Sheets PA   Signed by:   Esperanza Sheets PA on 05/12/2009   Method used:   Printed then faxed to ...       [facility]  [address] (retail)       [address]       [place], [place]  [address]       Ph: [phone]       Fax: [phone]   RxID:   [id number]    Medication Administration  Injection # 1:    Medication: Ketorolac-Toradol 15mg     Diagnosis: VIREMIA (ICD-790.8)    Route: IM    Site: RUOQ gluteus    Exp Date: 10/28/2010    Lot #: [id number]    Mfr: [email or web address]    Comments: toradol 60mg  given    Patient tolerated injection without complications    Given by: Lilyan Gilford LPN (May 12, 2009 11:24 AM)  Orders Added: 1)  Ketorolac-Toradol 15mg  [J1885] 2)  Est. Patient Level III [02542] 3)  Admin of Therapeutic Inj  intramuscular or subcutaneous [29528]

## 2010-04-28 NOTE — Assessment & Plan Note (Signed)
Summary: COLD   Vital Signs:  Patient profile:   65 year old male Height:      70 inches Weight:      245.50 pounds BMI:     35.35 O2 Sat:      98 % Pulse rate:   76 / minute Resp:     16 per minute BP sitting:   100 / 72  (left arm)  Vitals Entered By: Mauricia Area, CMA CC: having cough and congestion, clear drainage, has been taking claritin   Primary Provider:  Dr. [name]  CC:  having cough and congestion, clear drainage, and has been taking claritin.  History of Present Illness: Pt presents today with c/o nonprod cough, clear nasal congestion and achey x 2-3 days .  Started Claritin yesterday and does seem to help with his congestion.  + sneezing.  No fever or chills.  Hx of allergies this time of year per pt.  Pt states he is otherwise doing well.  His CLL is in remission.  Had labs last week for f/u.        Allergies: No Known Drug Allergies  Past History:  Past medical history reviewed for relevance to current acute and chronic problems.  Past Medical History: Reviewed history from 03/31/2009 and no changes required. CLL Hodgkin's disease Aortic Stenosis Atrial Fibrillation CAD Hyperlipidemia Hypertension  Review of Systems General:  Denies chills and fever. ENT:  Complains of nasal congestion; denies earache, postnasal drainage, sinus pressure, and sore throat. Resp:  Complains of cough; denies shortness of breath and sputum productive. Neuro:  Denies headaches. Allergy:  Complains of seasonal allergies and sneezing.  Physical Exam  General:  Well-developed,well-nourished,in no acute distress; alert,appropriate and cooperative throughout examination  Head:  Normocephalic and atraumatic without obvious abnormalities. No apparent alopecia or balding. Ears:  External ear exam shows no significant lesions or deformities.  Otoscopic examination reveals clear canals, tympanic membranes are intact bilaterally without bulging, retraction,  inflammation or discharge. Hearing is grossly normal bilaterally. Nose:  External nasal examination shows no deformity or inflammation. Nasal mucosa are pink and moist without lesions or exudates. Mouth:  Oral mucosa and oropharynx without lesions or exudates.   Neck:  No deformities, masses, or tenderness noted. Lungs:  Normal respiratory effort, chest expands symmetrically. Lungs are clear to auscultation, no crackles or wheezes. Heart:  Normal rate and regular rhythm. S1 and S2 normal without gallop, murmur, click, rub or other extra sounds. Cervical Nodes:  No lymphadenopathy noted Psych:  Cognition and judgment appear intact. Alert and cooperative with normal attention span and concentration. No apparent delusions, illusions, hallucinations   Impression & Recommendations:  Problem # 1:  UPPER RESPIRATORY INFECTION (ICD-465.9) Assessment Comment Only  His updated medication list for this problem includes:    Aspirin 81 Mg Tbec (Aspirin) .Marland Kitchen... Take one tablet by mouth daily  Orders: Depo- Medrol 40mg  (J1030) Admin of Therapeutic Inj  intramuscular or subcutaneous (11914)  Complete Medication List: 1)  Acyclovir 200 Mg Caps (Acyclovir) .... Take one tablet by mouth twice a day 2)  Folic Acid 1 Mg Tabs (Folic acid) .... Take one tablet by mouth once a dy 3)  Multivitamins Tabs (Multiple vitamin) .... Take one tablet by mouth once aday 4)  Lasix 40 Mg Tabs (Furosemide) .... Take 1/2 tablet by mouth every other day. 5)  Metoprolol Tartrate 50 Mg Tabs (Metoprolol tartrate) .Marland Kitchen.. 1 tab two times a day 6)  Aspirin 81 Mg Tbec (Aspirin) .... Take one tablet  by mouth daily 7)  Crestor 40 Mg Tabs (Rosuvastatin calcium) .Marland Kitchen.. 1 tab once daily 8)  Septra Ds 800-160 Mg Tabs (Sulfamethoxazole-trimethoprim) .... Take 1 two times a day x 10 days  Patient Instructions: 1)  Please schedule a follow-up appointment in 1 month with Dr Lodema Hong. 2)  Continue taking Claritin as needed. 3)  I have  prescribed an antibiotic for you. 4)  You have received DepoMedrol today to help with your allergies. 5)  You may return for a flu vaccine when you are feeling better. Prescriptions: SEPTRA DS 800-160 MG TABS (SULFAMETHOXAZOLE-TRIMETHOPRIM) take 1 two times a day x 10 days  #20 x 0   Entered and Authorized by:   Esperanza Sheets PA   Signed by:   Esperanza Sheets PA on 01/02/2010   Method used:   Electronically to        [facility]  [address] (retail)       [address]       [place], [place]  [address]       Ph: [phone]       Fax: [phone]   RxID:   [id number]    Medication Administration  Injection # 1:    Medication: Depo- Medrol 40mg     Diagnosis: UPPER RESPIRATORY INFECTION (ICD-465.9)    Route: IM    Site: RUOQ gluteus    Exp Date: 03/2011    Lot #: [id number]    Mfr: Pharmacia    Patient tolerated injection without complications    Given by: Johny Drilling  Orders Added: 1)  Depo- Medrol 40mg  [J1030] 2)  Admin of Therapeutic Inj  intramuscular or subcutaneous [96372] 3)  Est. Patient Level III [33295]

## 2010-04-28 NOTE — Progress Notes (Signed)
Summary: [facility]  [facility]   Imported By: Lind Guest 07/24/2009 10:07:42  _____________________________________________________________________  External Attachment:    Type:   Image     Comment:   External Document

## 2010-04-29 ENCOUNTER — Encounter: Payer: Self-pay | Admitting: Cardiology

## 2010-04-29 ENCOUNTER — Emergency Department (HOSPITAL_COMMUNITY)
Admission: EM | Admit: 2010-04-29 | Discharge: 2010-04-29 | Disposition: A | Discharge status: Home / Self Care | Payer: No Typology Code available for payment source | Attending: Emergency Medicine | Admitting: Emergency Medicine

## 2010-04-29 ENCOUNTER — Emergency Department (HOSPITAL_COMMUNITY)
Admit: 2010-04-29 | Discharge: 2010-04-29 | Disposition: A | Discharge status: Home / Self Care | Payer: No Typology Code available for payment source

## 2010-04-29 ENCOUNTER — Encounter (HOSPITAL_COMMUNITY): Admission: RE | Admit: 2010-04-29 | Payer: Medicare Other | Source: Home / Self Care | Admitting: Oncology

## 2010-04-29 DIAGNOSIS — Z79899 Other long term (current) drug therapy: Secondary | ICD-10-CM | POA: Insufficient documentation

## 2010-04-29 DIAGNOSIS — R071 Chest pain on breathing: Secondary | ICD-10-CM | POA: Insufficient documentation

## 2010-04-29 DIAGNOSIS — S139XXA Sprain of joints and ligaments of unspecified parts of neck, initial encounter: Secondary | ICD-10-CM | POA: Insufficient documentation

## 2010-04-29 DIAGNOSIS — Z951 Presence of aortocoronary bypass graft: Secondary | ICD-10-CM | POA: Insufficient documentation

## 2010-04-29 DIAGNOSIS — M25519 Pain in unspecified shoulder: Secondary | ICD-10-CM | POA: Insufficient documentation

## 2010-04-29 DIAGNOSIS — M542 Cervicalgia: Secondary | ICD-10-CM | POA: Insufficient documentation

## 2010-04-29 DIAGNOSIS — Y929 Unspecified place or not applicable: Secondary | ICD-10-CM | POA: Insufficient documentation

## 2010-04-29 DIAGNOSIS — Z856 Personal history of leukemia: Secondary | ICD-10-CM | POA: Insufficient documentation

## 2010-04-29 LAB — CONVERTED CEMR LAB
ALT: 21 units/L (ref 0–53)
AST: 22 units/L (ref 0–37)
Albumin: 4.3 g/dL (ref 3.5–5.2)
Alkaline Phosphatase: 86 units/L (ref 39–117)
Cholesterol: 205 mg/dL — ABNORMAL HIGH (ref 0–200)
HDL: 28 mg/dL — ABNORMAL LOW (ref >39)
Indirect Bilirubin: 0.5 mg/dL (ref 0.0–0.9)
LDL Cholesterol: 130 mg/dL — ABNORMAL HIGH (ref 0–99)
Total Protein: 6.1 g/dL (ref 6.0–8.3)
Triglycerides: 233 mg/dL — ABNORMAL HIGH (ref <150)

## 2010-04-30 ENCOUNTER — Other Ambulatory Visit (HOSPITAL_COMMUNITY): Payer: Medicare Other

## 2010-04-30 DIAGNOSIS — C8190 Hodgkin lymphoma, unspecified, unspecified site: Secondary | ICD-10-CM

## 2010-04-30 DIAGNOSIS — Z452 Encounter for adjustment and management of vascular access device: Secondary | ICD-10-CM

## 2010-04-30 DIAGNOSIS — C9110 Chronic lymphocytic leukemia of B-cell type not having achieved remission: Secondary | ICD-10-CM

## 2010-05-06 ENCOUNTER — Encounter: Payer: Self-pay | Admitting: Cardiology

## 2010-05-06 ENCOUNTER — Ambulatory Visit (INDEPENDENT_AMBULATORY_CARE_PROVIDER_SITE_OTHER): Payer: Medicare Other | Admitting: Cardiology

## 2010-05-06 ENCOUNTER — Encounter (INDEPENDENT_AMBULATORY_CARE_PROVIDER_SITE_OTHER): Payer: Self-pay | Admitting: *Deleted

## 2010-05-06 DIAGNOSIS — I10 Essential (primary) hypertension: Secondary | ICD-10-CM | POA: Insufficient documentation

## 2010-05-06 DIAGNOSIS — I359 Nonrheumatic aortic valve disorder, unspecified: Secondary | ICD-10-CM

## 2010-05-06 DIAGNOSIS — I2510 Atherosclerotic heart disease of native coronary artery without angina pectoris: Secondary | ICD-10-CM

## 2010-05-06 DIAGNOSIS — E782 Mixed hyperlipidemia: Secondary | ICD-10-CM

## 2010-05-06 NOTE — Letter (Signed)
Summary: [place] Results Engineer, agricultural at [facility]  [address], [place] [address]   Phone: [phone]  Fax: [phone]      April 29, 2010 MRN: [id number]   [name] [address] [place], [place]  [address]   Dear Mr. Strickler,  Your test ordered by Selena Batten has been reviewed by your physician (or physician assistant) and was found to be normal or stable. Your physician (or physician assistant) felt no changes were needed at this time.  ____ Echocardiogram  ____ Cardiac Stress Test  __X__ Lab Work  ____ Peripheral vascular study of arms, legs or neck  ____ CT scan or X-ray  ____ Lung or Breathing test  ____ Other:  Please continue on current medical treatment.  Thank you.   Nona Dell, MD, F.A.C.C

## 2010-05-13 ENCOUNTER — Encounter: Payer: Self-pay | Admitting: Family Medicine

## 2010-05-14 NOTE — Letter (Signed)
Summary: [name] Work Engineer, agricultural at [facility]  [address] [place] [address]   Phone: [phone]  Fax: [phone]     May 06, 2010 MRN: [id number]   [name] [address] [place], [place]  [address]      YOUR LAB WORK IS DUE  November 23, 2010 _________________________________________  Please go to [facility], located across the street from [facility] on the second floor.  Hours are Monday - Friday 7am until 7:30pm         Saturday 8am until 12noon    _X_  DO NOT EAT OR DRINK AFTER MIDNIGHT EVENING PRIOR TO LABWORK  __ YOUR LABWORK IS NOT FASTING --YOU MAY EAT PRIOR TO LABWORK

## 2010-05-14 NOTE — Miscellaneous (Signed)
Summary: samples crestor 20mg   Clinical Lists Changes  Medications: Changed medication from CRESTOR 20 MG TABS (ROSUVASTATIN CALCIUM) 2 tablets at bedtime to CRESTOR 20 MG TABS (ROSUVASTATIN CALCIUM) 2 tablets at bedtime

## 2010-05-14 NOTE — Assessment & Plan Note (Signed)
Summary: 6 mth f/u per checkout on 10/30/09/tg      Allergies Added: NKDA  Visit Type:  Follow-up Primary Provider:  Dr. [name]   History of Present Illness: 65 year old male presents for followup. He was seen in August 2011. Followup echocardiogram from that time is reviewed below.  He reports no angina or progressive shortness of breath. Does not exercise and is fairly functionally limited based on his description.  Labs from 31 January showed cholesterol 205, triglycerides 233, HDL 28, LDL 130, AST 22, ALT 21. Improvement noted with LDL down from 202. He indicates that he has been more compliant with Crestor, although still runs out of it at times. I spoke with him again about compliance today. Samples were given.  Current Medications (verified): 1)  Acyclovir 200 Mg  Caps (Acyclovir) .... Take One Tablet By Mouth Twice A Day 2)  Folic Acid 1 Mg  Tabs (Folic Acid) .... Take One Tablet By Mouth Once A Dy 3)  Multivitamins   Tabs (Multiple Vitamin) .... Take One Tablet By Mouth Once Aday 4)  Metoprolol Tartrate 50 Mg Tabs (Metoprolol Tartrate) .Marland Kitchen.. 1 Tab Two Times A Day 5)  Aspirin 81 Mg Tbec (Aspirin) .... Take One Tablet By Mouth Daily 6)  Crestor 20 Mg Tabs (Rosuvastatin Calcium) .... 2 Tablets At Bedtime  Allergies (verified): No Known Drug Allergies  Comments:  Nurse/Medical Assistant: patient didnt bring meds or list would like some samples of crestor 20 mg  patients [facility] in [place] we reviewed meds from previous ov   Past History:  Past Medical History: Last updated: 03/31/2009 CLL Hodgkin's disease Aortic Stenosis Atrial Fibrillation CAD Hyperlipidemia Hypertension  Past Surgical History: Last updated: 03/31/2009 Eye surgery CABG (SVG to RCA) 10/09 Valve Replacement-Aortic (23mm Magna pericardial) 10/09  Social History: Last updated: 03/31/2009 Pt works in the [employer] Married  2 children Denies tobacco Denies alcohol or street  drug use  Review of Systems       The patient complains of dyspnea on exertion.  The patient denies weight loss, chest pain, syncope, prolonged cough, abdominal pain, melena, and hematochezia.         No palpitations or syncope. Otherwise negative except as outlined.  Vital Signs:  Patient profile:   65 year old male Weight:      250 pounds BMI:     36.00 Pulse rate:   68 / minute BP sitting:   122 / 71  (left arm)  Vitals Entered By: Dreama Saa, CNA (May 06, 2010 10:06 AM)  Physical Exam  Additional Exam:  Morbidly obese male in no acute distress. HEENT: Conjunctiva and lids normal, oropharynx with poor dentition. Neck: Supple, no elevated jugular venous pressure or bruits. Lungs: Clear to auscultation, nonlabored. Cardiac: Regular rate and rhythm, S4, soft systolic murmur at the base, no S3. Abdomen: Obese, nontender, bowel sounds present. Extremities: Venous stasis noted, 1-2+ edema, distal pulses diminished. Skin: Warm and dry, scattered excoriations. Musculoskeletal: No gross deformities. Neuropsychiatric: Alert and oriented x3, affect grossly normal.   Echocardiogram  Procedure date:  11/04/2009  Findings:      Study Conclusions            - Left ventricle: The cavity size was normal. Wall thickness was       increased in a pattern of mild LVH. There was moderate basal       hypertrophy of the septum. Systolic function was normal. The       estimated ejection fraction  was in the range of 60% to 65%. Wall       motion was normal; there were no regional wall motion       abnormalities.     - Aortic valve: A bioprosthesis was present and functioning       normally. The prosthesis had a normal range of motion. The sewing       ring appeared normal.     - Mitral valve: Moderately to severely calcified annulus. Base of       the anterior leaflet is thickened and calcified. Valve area by       pressure half-time: 1.96cm 2.     - Left atrium: The atrium was  mildly dilated.     - Atrial septum: No defect or patent foramen ovale was identified.     - Pulmonary arteries: PA peak pressure: 35mm Hg (S).  EKG  Procedure date:  05/06/2010  Findings:      Sus rhythm at 67 beats per minute with left bundle branch block, old.  Impression & Recommendations:  Problem # 1:  AORTIC VALVE REPLACEMENT, HX OF (ICD-V43.3)  No active angina. Continue medical therapy. ECG with old left bundle branch block. Followup in 6 months.  Problem # 2:  AORTIC STENOSIS (ICD-424.1)  Stable bioprosthetic aortic valve by echocardiogram in November 2011.  His updated medication list for this problem includes:    Metoprolol Tartrate 50 Mg Tabs (Metoprolol tartrate) .Marland Kitchen... 1 tab two times a day  His updated medication list for this problem includes:    Metoprolol Tartrate 50 Mg Tabs (Metoprolol tartrate) .Marland Kitchen... 1 tab two times a day  Problem # 3:  HYPERLIPIDEMIA (ICD-272.4)  Again encouraged compliance with Crestor. He does seem to show a better trend in cholesterol control. LDL is not at goal as yet. Also discussed weight loss.  His updated medication list for this problem includes:    Crestor 20 Mg Tabs (Rosuvastatin calcium) .Marland Kitchen... 2 tablets at bedtime  Future Orders: T-Lipid Profile ([id number]) ... 09/28/2010 T-Hepatic Function [id number]) ... 09/28/2010  His updated medication list for this problem includes:    Crestor 20 Mg Tabs (Rosuvastatin calcium) .Marland Kitchen... 2 tablets at bedtime  Problem # 4:  ESSENTIAL HYPERTENSION, BENIGN (ICD-401.1)  Blood pressure well controlled today.  His updated medication list for this problem includes:    Metoprolol Tartrate 50 Mg Tabs (Metoprolol tartrate) .Marland Kitchen... 1 tab two times a day    Aspirin 81 Mg Tbec (Aspirin) .Marland Kitchen... Take one tablet by mouth daily  His updated medication list for this problem includes:    Metoprolol Tartrate 50 Mg Tabs (Metoprolol tartrate) .Marland Kitchen... 1 tab two times a day    Aspirin 81 Mg Tbec (Aspirin)  .Marland Kitchen... Take one tablet by mouth daily  Patient Instructions: 1)  Your physician recommends that you schedule a follow-up appointment in: 6months 2)  Your physician recommends that you return for lab work in: 6 months, just before next office visit 3)  Your physician recommends that you continue on your current medications as directed. Please refer to the Current Medication list given to you today.

## 2010-05-21 ENCOUNTER — Encounter: Payer: Self-pay | Admitting: Family Medicine

## 2010-05-26 ENCOUNTER — Encounter: Payer: Self-pay | Admitting: Family Medicine

## 2010-06-03 ENCOUNTER — Other Ambulatory Visit: Payer: Self-pay | Admitting: Family Medicine

## 2010-06-03 LAB — BASIC METABOLIC PANEL
BUN: 10 mg/dL (ref 6–23)
Calcium: 8.8 mg/dL (ref 8.4–10.5)
Glucose, Bld: 111 mg/dL — ABNORMAL HIGH (ref 70–99)

## 2010-06-03 LAB — HEPATIC FUNCTION PANEL
ALT: 18 U/L (ref 0–53)
AST: 21 U/L (ref 0–37)
Bilirubin, Direct: 0.1 mg/dL (ref 0.0–0.3)
Indirect Bilirubin: 0.6 mg/dL (ref 0.0–0.9)

## 2010-06-03 LAB — LIPID PANEL: Cholesterol: 222 mg/dL — ABNORMAL HIGH (ref 0–200)

## 2010-06-05 ENCOUNTER — Encounter: Payer: Self-pay | Admitting: Family Medicine

## 2010-06-05 ENCOUNTER — Ambulatory Visit (INDEPENDENT_AMBULATORY_CARE_PROVIDER_SITE_OTHER): Payer: Medicare Other | Admitting: Family Medicine

## 2010-06-05 ENCOUNTER — Other Ambulatory Visit: Payer: Self-pay | Admitting: Family Medicine

## 2010-06-05 DIAGNOSIS — M542 Cervicalgia: Secondary | ICD-10-CM

## 2010-06-05 DIAGNOSIS — E785 Hyperlipidemia, unspecified: Secondary | ICD-10-CM

## 2010-06-05 DIAGNOSIS — I359 Nonrheumatic aortic valve disorder, unspecified: Secondary | ICD-10-CM

## 2010-06-05 LAB — CONVERTED CEMR LAB
ALT: 18 units/L (ref 0–53)
Bilirubin, Direct: 0.1 mg/dL (ref 0.0–0.3)
CO2: 30 meq/L (ref 19–32)
Cholesterol: 222 mg/dL — ABNORMAL HIGH (ref 0–200)
Glucose, Bld: 111 mg/dL — ABNORMAL HIGH (ref 70–99)
Potassium: 4.2 meq/L (ref 3.5–5.3)
Sodium: 141 meq/L (ref 135–145)
Total Bilirubin: 0.7 mg/dL (ref 0.3–1.2)
Total CHOL/HDL Ratio: 9.7
VLDL: 54 mg/dL — ABNORMAL HIGH (ref 0–40)

## 2010-06-06 LAB — HEMOGLOBIN A1C: Mean Plasma Glucose: 114 mg/dL (ref <117)

## 2010-06-08 LAB — COMPREHENSIVE METABOLIC PANEL
AST: 18 U/L (ref 0–37)
Albumin: 3.4 g/dL — ABNORMAL LOW (ref 3.5–5.2)
BUN: 12 mg/dL (ref 6–23)
CO2: 32 mEq/L (ref 19–32)
Calcium: 8.5 mg/dL (ref 8.4–10.5)
Calcium: 9 mg/dL (ref 8.4–10.5)
Chloride: 107 mEq/L (ref 96–112)
Creatinine, Ser: 1.25 mg/dL (ref 0.4–1.5)
Creatinine, Ser: 1.96 mg/dL — ABNORMAL HIGH (ref 0.4–1.5)
GFR calc Af Amer: 42 mL/min — ABNORMAL LOW (ref >60)
GFR calc non Af Amer: 58 mL/min — ABNORMAL LOW (ref >60)
Glucose, Bld: 114 mg/dL — ABNORMAL HIGH (ref 70–99)
Total Bilirubin: 1 mg/dL (ref 0.3–1.2)

## 2010-06-08 LAB — URINE CULTURE: Colony Count: 30000

## 2010-06-08 LAB — CBC
HCT: 36 % — ABNORMAL LOW (ref 39.0–52.0)
Hemoglobin: 12.1 g/dL — ABNORMAL LOW (ref 13.0–17.0)
MCH: 32.4 pg (ref 26.0–34.0)
MCHC: 33.6 g/dL (ref 30.0–36.0)
MCHC: 34.1 g/dL (ref 30.0–36.0)
Platelets: 84 10*3/uL — ABNORMAL LOW (ref 150–400)
RBC: 3.73 MIL/uL — ABNORMAL LOW (ref 4.22–5.81)

## 2010-06-08 LAB — URINE MICROSCOPIC-ADD ON

## 2010-06-08 LAB — DIFFERENTIAL
Basophils Absolute: 0.1 10*3/uL (ref 0.0–0.1)
Eosinophils Relative: 5 % (ref 0–5)
Eosinophils Relative: 6 % — ABNORMAL HIGH (ref 0–5)
Lymphocytes Relative: 40 % (ref 12–46)
Lymphocytes Relative: 40 % (ref 12–46)
Lymphs Abs: 3.4 10*3/uL (ref 0.7–4.0)
Lymphs Abs: 3.5 10*3/uL (ref 0.7–4.0)
Monocytes Absolute: 0.7 10*3/uL (ref 0.1–1.0)
Monocytes Relative: 8 % (ref 3–12)
Neutro Abs: 4 10*3/uL (ref 1.7–7.7)
Neutrophils Relative %: 47 % (ref 43–77)

## 2010-06-08 LAB — URINALYSIS, ROUTINE W REFLEX MICROSCOPIC
Nitrite: NEGATIVE
Specific Gravity, Urine: >1.03 — ABNORMAL HIGH (ref 1.005–1.030)
Urobilinogen, UA: 1 mg/dL (ref 0.0–1.0)

## 2010-06-10 LAB — PROTEIN ELECTROPHORESIS, SERUM
Albumin ELP: 66.3 % — ABNORMAL HIGH (ref 55.8–66.1)
Beta 2: 3.5 % (ref 3.2–6.5)
Total Protein ELP: 7.9 g/dL (ref 6.0–8.3)

## 2010-06-10 LAB — CBC
MCV: 99.7 fL (ref 78.0–100.0)
Platelets: 104 10*3/uL — ABNORMAL LOW (ref 150–400)
RBC: 3.98 MIL/uL — ABNORMAL LOW (ref 4.22–5.81)
WBC: 5.7 10*3/uL (ref 4.0–10.5)

## 2010-06-10 LAB — DIFFERENTIAL
Basophils Absolute: 0.1 10*3/uL (ref 0.0–0.1)
Basophils Relative: 1 % (ref 0–1)
Eosinophils Absolute: 0.5 10*3/uL (ref 0.0–0.7)
Eosinophils Relative: 8 % — ABNORMAL HIGH (ref 0–5)
Monocytes Absolute: 0.4 10*3/uL (ref 0.1–1.0)

## 2010-06-10 LAB — COMPREHENSIVE METABOLIC PANEL
ALT: 24 U/L (ref 0–53)
AST: 27 U/L (ref 0–37)
Albumin: 4 g/dL (ref 3.5–5.2)
Alkaline Phosphatase: 77 U/L (ref 39–117)
BUN: 8 mg/dL (ref 6–23)
Chloride: 106 mEq/L (ref 96–112)
Potassium: 3.8 mEq/L (ref 3.5–5.1)
Sodium: 140 mEq/L (ref 135–145)
Total Bilirubin: 0.9 mg/dL (ref 0.3–1.2)

## 2010-06-10 LAB — IMMUNOFIXATION ELECTROPHORESIS
IgA: 69 mg/dL (ref 68–378)
IgG (Immunoglobin G), Serum: 648 mg/dL — ABNORMAL LOW (ref 694–1618)
Total Protein ELP: 6.1 g/dL (ref 6.0–8.3)

## 2010-06-10 LAB — TSH: TSH: 3.642 u[IU]/mL (ref 0.350–4.500)

## 2010-06-11 ENCOUNTER — Encounter (HOSPITAL_COMMUNITY): Payer: Medicare Other

## 2010-06-11 LAB — COMPREHENSIVE METABOLIC PANEL
Albumin: 3.7 g/dL (ref 3.5–5.2)
BUN: 8 mg/dL (ref 6–23)
Calcium: 8.7 mg/dL (ref 8.4–10.5)
Chloride: 106 mEq/L (ref 96–112)
Creatinine, Ser: 0.97 mg/dL (ref 0.4–1.5)
GFR calc non Af Amer: >60 mL/min (ref >60)
Total Bilirubin: 0.7 mg/dL (ref 0.3–1.2)

## 2010-06-11 LAB — DIFFERENTIAL
Basophils Absolute: 0 10*3/uL (ref 0.0–0.1)
Lymphocytes Relative: 47 % — ABNORMAL HIGH (ref 12–46)
Monocytes Absolute: 0.4 10*3/uL (ref 0.1–1.0)
Neutro Abs: 3 10*3/uL (ref 1.7–7.7)

## 2010-06-11 LAB — CBC
MCH: 34 pg (ref 26.0–34.0)
MCHC: 34.1 g/dL (ref 30.0–36.0)
MCV: 99.5 fL (ref 78.0–100.0)
Platelets: 107 10*3/uL — ABNORMAL LOW (ref 150–400)
RBC: 3.79 MIL/uL — ABNORMAL LOW (ref 4.22–5.81)

## 2010-06-11 LAB — LACTATE DEHYDROGENASE: LDH: 215 U/L (ref 94–250)

## 2010-06-16 NOTE — Letter (Signed)
Summary: nephrology  nephrology   Imported By: Lind Guest 06/09/2010 14:50:27  _____________________________________________________________________  External Attachment:    Type:   Image     Comment:   External Document
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
## 2010-06-18 ENCOUNTER — Encounter (HOSPITAL_COMMUNITY): Payer: Medicare Other

## 2010-06-18 ENCOUNTER — Other Ambulatory Visit: Payer: Self-pay

## 2010-06-18 DIAGNOSIS — Z452 Encounter for adjustment and management of vascular access device: Secondary | ICD-10-CM

## 2010-06-18 DIAGNOSIS — I10 Essential (primary) hypertension: Secondary | ICD-10-CM

## 2010-06-18 DIAGNOSIS — C8190 Hodgkin lymphoma, unspecified, unspecified site: Secondary | ICD-10-CM

## 2010-06-19 MED ORDER — METOPROLOL TARTRATE 50 MG PO TABS: 50.0000 mg | ORAL_TABLET | Freq: Two times a day (BID) | ORAL | Status: DC

## 2010-06-21 LAB — COMPREHENSIVE METABOLIC PANEL
AST: 18 U/L (ref 0–37)
Albumin: 3.5 g/dL (ref 3.5–5.2)
Alkaline Phosphatase: 73 U/L (ref 39–117)
Chloride: 107 mEq/L (ref 96–112)
GFR calc Af Amer: >60 mL/min (ref >60)
Potassium: 3.8 mEq/L (ref 3.5–5.1)
Sodium: 142 mEq/L (ref 135–145)
Total Bilirubin: 0.5 mg/dL (ref 0.3–1.2)

## 2010-06-21 LAB — DIFFERENTIAL
Basophils Absolute: 0 10*3/uL (ref 0.0–0.1)
Basophils Relative: 1 % (ref 0–1)
Eosinophils Relative: 8 % — ABNORMAL HIGH (ref 0–5)
Monocytes Absolute: 0.5 10*3/uL (ref 0.1–1.0)

## 2010-06-21 LAB — CBC
RBC: 3.82 MIL/uL — ABNORMAL LOW (ref 4.22–5.81)
WBC: 6.6 10*3/uL (ref 4.0–10.5)

## 2010-06-23 ENCOUNTER — Other Ambulatory Visit: Payer: Self-pay | Admitting: *Deleted

## 2010-06-23 DIAGNOSIS — I10 Essential (primary) hypertension: Secondary | ICD-10-CM

## 2010-06-23 MED ORDER — METOPROLOL TARTRATE 50 MG PO TABS: 50.0000 mg | ORAL_TABLET | Freq: Two times a day (BID) | ORAL | Status: DC

## 2010-06-29 ENCOUNTER — Encounter (HOSPITAL_COMMUNITY): Payer: Medicare Other | Admitting: Oncology

## 2010-06-30 LAB — BASIC METABOLIC PANEL
CO2: 27 mEq/L (ref 19–32)
Calcium: 8.5 mg/dL (ref 8.4–10.5)
GFR calc Af Amer: >60 mL/min (ref >60)
GFR calc non Af Amer: >60 mL/min (ref >60)
Sodium: 138 mEq/L (ref 135–145)

## 2010-06-30 LAB — URINE MICROSCOPIC-ADD ON

## 2010-06-30 LAB — DIFFERENTIAL
Lymphocytes Relative: 41 % (ref 12–46)
Lymphs Abs: 1.1 10*3/uL (ref 0.7–4.0)
Monocytes Absolute: 0.4 10*3/uL (ref 0.1–1.0)
Monocytes Relative: 13 % — ABNORMAL HIGH (ref 3–12)
Neutro Abs: 1.1 10*3/uL — ABNORMAL LOW (ref 1.7–7.7)
Neutrophils Relative %: 38 % — ABNORMAL LOW (ref 43–77)

## 2010-06-30 LAB — POCT CARDIAC MARKERS
CKMB, poc: <1 ng/mL — ABNORMAL LOW (ref 1.0–8.0)
Myoglobin, poc: 94.4 ng/mL (ref 12–200)
Troponin i, poc: <0.05 ng/mL (ref 0.00–0.09)

## 2010-06-30 LAB — URINALYSIS, ROUTINE W REFLEX MICROSCOPIC
Glucose, UA: NEGATIVE mg/dL
Hgb urine dipstick: NEGATIVE
Protein, ur: NEGATIVE mg/dL
pH: 5 (ref 5.0–8.0)

## 2010-06-30 LAB — CBC
Hemoglobin: 12.1 g/dL — ABNORMAL LOW (ref 13.0–17.0)
MCHC: 34.3 g/dL (ref 30.0–36.0)
RBC: 3.61 MIL/uL — ABNORMAL LOW (ref 4.22–5.81)
WBC: 2.8 10*3/uL — ABNORMAL LOW (ref 4.0–10.5)

## 2010-07-01 LAB — DIFFERENTIAL
Basophils Relative: 1 % (ref 0–1)
Lymphocytes Relative: 34 % (ref 12–46)
Monocytes Absolute: 0.3 10*3/uL (ref 0.1–1.0)
Monocytes Relative: 7 % (ref 3–12)
Neutro Abs: 2.3 10*3/uL (ref 1.7–7.7)
Neutrophils Relative %: 51 % (ref 43–77)

## 2010-07-01 LAB — CBC
Hemoglobin: 12.7 g/dL — ABNORMAL LOW (ref 13.0–17.0)
RBC: 3.69 MIL/uL — ABNORMAL LOW (ref 4.22–5.81)
WBC: 4.5 10*3/uL (ref 4.0–10.5)

## 2010-07-03 LAB — DIFFERENTIAL
Basophils Absolute: 0 10*3/uL (ref 0.0–0.1)
Basophils Relative: 1 % (ref 0–1)
Eosinophils Absolute: 0.3 10*3/uL (ref 0.0–0.7)
Neutro Abs: 2.7 10*3/uL (ref 1.7–7.7)
Neutrophils Relative %: 45 % (ref 43–77)

## 2010-07-03 LAB — COMPREHENSIVE METABOLIC PANEL
ALT: 23 U/L (ref 0–53)
Alkaline Phosphatase: 66 U/L (ref 39–117)
BUN: 11 mg/dL (ref 6–23)
CO2: 28 mEq/L (ref 19–32)
Calcium: 8.7 mg/dL (ref 8.4–10.5)
GFR calc non Af Amer: >60 mL/min (ref >60)
Glucose, Bld: 88 mg/dL (ref 70–99)
Potassium: 3.7 mEq/L (ref 3.5–5.1)
Sodium: 137 mEq/L (ref 135–145)
Total Protein: 6.2 g/dL (ref 6.0–8.3)

## 2010-07-03 LAB — GLUCOSE, CAPILLARY: Glucose-Capillary: 106 mg/dL — ABNORMAL HIGH (ref 70–99)

## 2010-07-03 LAB — CBC
HCT: 36.8 % — ABNORMAL LOW (ref 39.0–52.0)
Hemoglobin: 13.1 g/dL (ref 13.0–17.0)
MCHC: 35.4 g/dL (ref 30.0–36.0)
RBC: 3.72 MIL/uL — ABNORMAL LOW (ref 4.22–5.81)
RDW: 14.5 % (ref 11.5–15.5)

## 2010-07-03 LAB — SEDIMENTATION RATE: Sed Rate: 14 mm/hr (ref 0–16)

## 2010-07-03 LAB — LACTATE DEHYDROGENASE: LDH: 176 U/L (ref 94–250)

## 2010-07-06 ENCOUNTER — Other Ambulatory Visit (HOSPITAL_COMMUNITY): Payer: Self-pay | Admitting: Oncology

## 2010-07-06 ENCOUNTER — Encounter (HOSPITAL_COMMUNITY): Payer: Medicare Other | Attending: Oncology | Admitting: Oncology

## 2010-07-06 DIAGNOSIS — C9110 Chronic lymphocytic leukemia of B-cell type not having achieved remission: Secondary | ICD-10-CM

## 2010-07-06 DIAGNOSIS — C8589 Other specified types of non-Hodgkin lymphoma, extranodal and solid organ sites: Secondary | ICD-10-CM

## 2010-07-09 LAB — BASIC METABOLIC PANEL
BUN: 13 mg/dL (ref 6–23)
Calcium: 9.1 mg/dL (ref 8.4–10.5)
Creatinine, Ser: 1.36 mg/dL (ref 0.4–1.5)
GFR calc non Af Amer: 53 mL/min — ABNORMAL LOW (ref >60)
Glucose, Bld: 106 mg/dL — ABNORMAL HIGH (ref 70–99)

## 2010-07-09 LAB — DIFFERENTIAL
Basophils Absolute: 0 10*3/uL (ref 0.0–0.1)
Eosinophils Relative: 5 % (ref 0–5)
Lymphocytes Relative: 32 % (ref 12–46)
Neutro Abs: 3.5 10*3/uL (ref 1.7–7.7)
Neutrophils Relative %: 56 % (ref 43–77)

## 2010-07-09 LAB — CBC
HCT: 37.4 % — ABNORMAL LOW (ref 39.0–52.0)
Platelets: 144 10*3/uL — ABNORMAL LOW (ref 150–400)
RDW: 17.3 % — ABNORMAL HIGH (ref 11.5–15.5)
WBC: 6.2 10*3/uL (ref 4.0–10.5)

## 2010-07-09 LAB — SEDIMENTATION RATE: Sed Rate: 25 mm/hr — ABNORMAL HIGH (ref 0–16)

## 2010-07-09 LAB — LACTATE DEHYDROGENASE: LDH: 193 U/L (ref 94–250)

## 2010-07-13 LAB — GLUCOSE, CAPILLARY: Glucose-Capillary: 102 mg/dL — ABNORMAL HIGH (ref 70–99)

## 2010-07-30 ENCOUNTER — Encounter (HOSPITAL_COMMUNITY): Payer: Medicare Other | Attending: Oncology

## 2010-07-30 DIAGNOSIS — Z452 Encounter for adjustment and management of vascular access device: Secondary | ICD-10-CM

## 2010-07-30 DIAGNOSIS — C8190 Hodgkin lymphoma, unspecified, unspecified site: Secondary | ICD-10-CM

## 2010-08-10 ENCOUNTER — Emergency Department (HOSPITAL_COMMUNITY): Payer: Medicare Other

## 2010-08-10 ENCOUNTER — Inpatient Hospital Stay (HOSPITAL_COMMUNITY)
Admission: AD | Admit: 2010-08-10 | Discharge: 2010-08-12 | DRG: 392 | Disposition: A | Discharge status: Home / Self Care | Payer: Medicare Other | Source: Ambulatory Visit | Attending: Internal Medicine | Admitting: Internal Medicine

## 2010-08-10 DIAGNOSIS — N390 Urinary tract infection, site not specified: Secondary | ICD-10-CM | POA: Diagnosis present

## 2010-08-10 DIAGNOSIS — R1310 Dysphagia, unspecified: Secondary | ICD-10-CM

## 2010-08-10 DIAGNOSIS — Z951 Presence of aortocoronary bypass graft: Secondary | ICD-10-CM

## 2010-08-10 DIAGNOSIS — E860 Dehydration: Secondary | ICD-10-CM | POA: Diagnosis present

## 2010-08-10 DIAGNOSIS — K2100 Gastro-esophageal reflux disease with esophagitis, without bleeding: Secondary | ICD-10-CM | POA: Diagnosis present

## 2010-08-10 DIAGNOSIS — Z954 Presence of other heart-valve replacement: Secondary | ICD-10-CM

## 2010-08-10 DIAGNOSIS — C8190 Hodgkin lymphoma, unspecified, unspecified site: Secondary | ICD-10-CM | POA: Diagnosis present

## 2010-08-10 DIAGNOSIS — R112 Nausea with vomiting, unspecified: Secondary | ICD-10-CM

## 2010-08-10 DIAGNOSIS — I2510 Atherosclerotic heart disease of native coronary artery without angina pectoris: Secondary | ICD-10-CM | POA: Diagnosis present

## 2010-08-10 DIAGNOSIS — K222 Esophageal obstruction: Principal | ICD-10-CM | POA: Diagnosis present

## 2010-08-10 DIAGNOSIS — K449 Diaphragmatic hernia without obstruction or gangrene: Secondary | ICD-10-CM | POA: Diagnosis present

## 2010-08-10 LAB — BASIC METABOLIC PANEL
BUN: 12 mg/dL (ref 6–23)
Chloride: 105 mEq/L (ref 96–112)
Creatinine, Ser: 0.96 mg/dL (ref 0.4–1.5)

## 2010-08-10 LAB — HEPATIC FUNCTION PANEL
ALT: 17 U/L (ref 0–53)
Alkaline Phosphatase: 85 U/L (ref 39–117)
Indirect Bilirubin: 0.7 mg/dL (ref 0.3–0.9)
Total Protein: 6.5 g/dL (ref 6.0–8.3)

## 2010-08-10 LAB — CBC
MCHC: 34.1 g/dL (ref 30.0–36.0)
Platelets: 77 10*3/uL — ABNORMAL LOW (ref 150–400)
RDW: 14.6 % (ref 11.5–15.5)

## 2010-08-10 LAB — URINALYSIS, ROUTINE W REFLEX MICROSCOPIC
Nitrite: NEGATIVE
Protein, ur: NEGATIVE mg/dL
Urobilinogen, UA: 1 mg/dL (ref 0.0–1.0)

## 2010-08-10 LAB — DIFFERENTIAL
Basophils Absolute: 0 10*3/uL (ref 0.0–0.1)
Lymphs Abs: 8.4 10*3/uL — ABNORMAL HIGH (ref 0.7–4.0)
Monocytes Absolute: 0.5 10*3/uL (ref 0.1–1.0)
Neutrophils Relative %: 24 % — ABNORMAL LOW (ref 43–77)

## 2010-08-10 LAB — POCT CARDIAC MARKERS
CKMB, poc: <1 ng/mL — ABNORMAL LOW (ref 1.0–8.0)
Troponin i, poc: <0.05 ng/mL (ref 0.00–0.09)

## 2010-08-10 LAB — URINE MICROSCOPIC-ADD ON

## 2010-08-10 LAB — LIPASE, BLOOD: Lipase: 41 U/L (ref 11–59)

## 2010-08-11 ENCOUNTER — Inpatient Hospital Stay (HOSPITAL_COMMUNITY): Payer: Medicare Other

## 2010-08-11 LAB — CBC
Hemoglobin: 11.3 g/dL — ABNORMAL LOW (ref 13.0–17.0)
RBC: 3.41 MIL/uL — ABNORMAL LOW (ref 4.22–5.81)
WBC: 10.5 10*3/uL (ref 4.0–10.5)

## 2010-08-11 LAB — APTT: aPTT: 26 seconds (ref 24–37)

## 2010-08-11 LAB — DIFFERENTIAL
Basophils Absolute: 0 10*3/uL (ref 0.0–0.1)
Lymphocytes Relative: 69 % — ABNORMAL HIGH (ref 12–46)
Neutro Abs: 2.4 10*3/uL (ref 1.7–7.7)
Neutrophils Relative %: 23 % — ABNORMAL LOW (ref 43–77)

## 2010-08-11 LAB — CARDIAC PANEL(CRET KIN+CKTOT+MB+TROPI)
CK, MB: 2.7 ng/mL (ref 0.3–4.0)
Relative Index: INVALID (ref 0.0–2.5)
Total CK: 60 U/L (ref 7–232)
Total CK: 75 U/L (ref 7–232)
Troponin I: <0.3 ng/mL (ref <0.30)

## 2010-08-11 LAB — BASIC METABOLIC PANEL
CO2: 31 mEq/L (ref 19–32)
Calcium: 8.7 mg/dL (ref 8.4–10.5)
GFR calc Af Amer: >60 mL/min (ref >60)
GFR calc non Af Amer: >60 mL/min (ref >60)
Sodium: 141 mEq/L (ref 135–145)

## 2010-08-11 NOTE — Op Note (Signed)
NAMECARVELL, HOEFFNER              ACCOUNT NO.:  [id number]   MEDICAL RECORD NO.:  [id number]          PATIENT TYPE:  AMB   LOCATION:  DAY                           FACILITY:  [facility]   PHYSICIAN:  Dennie Maizes, M.D.   DATE OF BIRTH:  02-27-1946   DATE OF PROCEDURE:  10/10/2006  DATE OF DISCHARGE:                               OPERATIVE REPORT   PREOPERATIVE DIAGNOSIS:  Recurrent balanitis, phimosis.   POSTOPERATIVE DIAGNOSIS:  Recurrent balanitis, phimosis.   OPERATIVE PROCEDURE:  Circumcision.   ANESTHESIA:  Spinal.   SURGEON:  None.   COMPLICATIONS:  None.   ESTIMATED BLOOD LOSS:  Minimal.   INDICATIONS FOR PROCEDURE:  This 65 year old male had recurrent  balanitis associated with phimosis.  He was taken to the operating room  today for circumcision.   DESCRIPTION OF PROCEDURE:  Spinal anesthesia was induced and the patient  was placed on the OR table in the supine position.  Lower abdomen and  genitalia were prepped and draped in a sterile fashion.  Examination  revealed a recurrent balanitis with phimosis.  There is a lot of  scarring of the foreskin.  The genitalia were then prepped and draped in  a sterile fashion.  The foreskin was clamped at the 6 and 12 o'clock  positions.  Dorsal and ventral slits were made.  Two lateral skin flaps  were raised.  The redundant and fibrous foreskin was then excised.  Hemostasis was obtained by cauterization.  The edges of the foreskin was  then abutted and approximated using four 4-0 chromic gut.  The sponges  and instruments were correct x2.  A Vaseline gauze dressing was applied  to the penis.  The patient was transferred to the PACU in a satisfactory  condition.      Dennie Maizes, M.D.  Electronically Signed     SK/MEDQ  D:  10/10/2006  T:  10/10/2006  Job:  [id number]   cc:   Dr. Lodema Hong

## 2010-08-11 NOTE — Assessment & Plan Note (Signed)
[facility]                       [place] CARDIOLOGY OFFICE NOTE   NAME:[name], [name]                     MRN:          [id number]  DATE:10/17/2007                            DOB:          04/13/1945    Michaela returns today for followup.  He is doing a little bit better,  last time I saw he had some cellulitis in the lower extremities and he  was taking Keflex.  He has severe aortic stenosis.  His echocardiogram  done in Aug 18, 2007, shows an EF of 55% with a mean aortic valve  gradient 55 and peak gradient 93.   The patient needs to have a right and left heart cath.   I talked to him in length about this including the risks of stroke and  bleeding.  The patient understands as need for AVR.  His cellulitis  seems to be improved.  He was recently treated for URI and his shortness  of breath is less.  I do think he is symptomatic.  He has had lower  extremity edema and exertional dyspnea.  He had an episode of atypical  chest pain and his coronary anatomy will be further elucidated.  He had  a nonischemic Myoview in September 2008, with an EF of 77% and  clinically I do not think he has coronary artery disease.   The patient has seen Dr. [name] in the past.  He has CLL, I believe I  do not have recent blood counts on him.  However, we need to get Dr.  Thornton Papas records to make sure that there is no contraindications to  any aortic valve surgery in regards to his oncology status.   REVIEW OF SYSTEMS:  Otherwise, currently negative.   MEDICATIONS:  He is on folic acid, multivitamins, and p.r.n. Lasix   PHYSICAL EXAMINATION:  VITAL SIGNS:  His current weight is 360, blood  pressure is 113/72, pulse 86 and regular, respiratory 14, and afebrile.  HEENT:  Unremarkable.  NECK:  Carotids have parvus et tardus.  There is a transmitted murmur.  No JVP elevation, lymphadenopathy, or thyromegaly.  LUNGS:  Clear with good diaphragmatic motion.   No wheezing.  There is an  S1, second heart sound is decreased.  There is a severe AS murmur, which  is somewhat high pitched.  PMI is not laterally displaced.  ABDOMEN:  Protuberant.  There is no splenomegaly.  No AAA, no  tenderness, and no bruit.  EXTREMITIES:  Distal pulses are intact with trace edema.  NEURO:  Nonfocal.  SKIN:  Warm and dry.  No muscular weakness.   His EKG is essentially normal.   IMPRESSION:  1. Severe aortic stenosis with dyspnea and lower extremity edema,      right and left heart cath performed next week.  Subsequent referral      to Dr. Cornelius Moras and Dr. Tyrone Sage for possible aortic valve replacement.  2. History of chronic lymphocytic leukemia, we need to get records      from the oncology clinic and Dr. [name], here in [place].  We      will check CBC  and platelet count as part of his pre-cath list.      Lab work from May 18, 2007, showed a hematocrit of 36.5, white      count of 5.3, and platelets were 126,000 and he should not be      prohibitive either cath or open heart surgery.  3. Lower extremity edema, improved.  Continue p.r.n. Lasix.  Further      recommendations on medications will be based on results of his      right heart cath.  4. Recent cellulitis, improved, currently off Keflex.   We would like to keep his legs free of infection in regards to  possibility of needing endoscopic vein harvest.   He will have his lab work today including a repeat chest x-ray to make  sure his URI has not progressed and subsequently after his right and  left heart cath will be referred to see VATS for possible aortic valve  replaced.     Noralyn Pick. Eden Emms, MD, [facility]  Electronically Signed    PCN/MedQ  DD: 10/17/2007  DT: 10/18/2007  Job #: [id number]

## 2010-08-11 NOTE — Assessment & Plan Note (Signed)
OFFICE VISIT   [name], [name]  DOB:  1946/03/08                                        November 20, 2007  CHART #:  [id number]   HISTORY OF PRESENT ILLNESS:  The patient returns for further followup of  severe aortic stenosis and single-vessel coronary artery disease.  He  was originally seen in consultation on October 26, 2007, and at that time,  we had tentatively made plans for surgery later this week.  Since then,  he underwent upper GI barium swallow to evaluate symptoms of solid-food  dysphagia.  He was found to have a Schatzki ring with significant  obstruction of the lower esophagus.  The patient had planned to undergo  upper GI endoscopy and dilatation of his lower esophageal stricture by  Dr. Stan Head.  However, during the interim period of time, he  developed a febrile illness and cough for which he was evaluated by Dr.  [name].  He was given a course of oral doxycycline, and these  symptoms have improved.  He returns for further followup today.  He  states that when he first got sick 10 days ago, he had fevers in excess  of 102 degrees with significant productive cough.  With oral  doxycycline, these symptoms have gradually improved and he is now  feeling much better.  He denies any ongoing febrile illness.  He denies  any shortness of breath.  He has no chest pain.  The remainder of his  review of systems is unchanged from previously.   PHYSICAL EXAMINATION:  GENERAL:  Notable for a well-appearing obese male  with blood pressure 108/77, pulse 98, oxygen saturation 98% on room air.  He is afebrile.  CHEST:  Auscultation of the chest reveals clear breath sounds, which are  symmetrical bilaterally.  No wheezes or rhonchi are noted.  CARDIOVASCULAR:  Demonstrates regular rate and rhythm.  There is  persistent systolic murmur consistent with aortic stenosis.  No other  abnormalities are noted.  ABDOMEN:  Soft, nontender.  EXTREMITIES:   Warm and well perfused.   IMPRESSION:  Resolving febrile illness and cough, presumably due to  acute tracheal bronchitis.  In the setting of significant lower  esophageal stricture, it is also possible he could be suffering from  significant reflux.  The patient remains stable from a cardiac  standpoint.  We will plan to hold off on surgery for another few weeks  to allow these other issues to resolve.   PLAN:  I have encouraged the patient to go ahead and reschedule his  appointment with Dr. Leone Payor for upper GI endoscopy and dilatation of  his lower esophageal stricture.  We will obtain a chest x-ray today to  make sure there is no sign of pneumonia.  We will plan to see him back  in 3 weeks, and consider rescheduling surgery at that time.   Salvatore Decent. Cornelius Moras, M.D.  Electronically Signed   CHO/MEDQ  D:  11/20/2007  T:  11/21/2007  Job:  [id number]   cc:   Noralyn Pick. Eden Emms, MD, [facility]  [name]. [name], MD  Milus Mallick Lodema Hong, M.D.  Iva Boop, MD,FACG  Billie Lade, M.D.  Barbaraann Barthel, M.D.  Bindu Allena Katz

## 2010-08-11 NOTE — Assessment & Plan Note (Signed)
[facility]                       [place] CARDIOLOGY OFFICE NOTE   NAME:[name], [name]                     MRN:          [id number]  DATE:10/26/2007                            DOB:          05-04-1945    Tlaloc returns today for followup.  He had his heart catheterization  performed on October 24, 2007.  This was done with Dr. Antoine Poche and Dr.  Shirlee Latch and he has severe aortic stenosis with a 50% RCA lesion.  He has  a followup appointment with Dr. Cornelius Moras this afternoon.  His leg has been  little sore since his cath.  I did finally receive the records from  [facility].  The patient has had stage III-B mixed  cellularity Hodgkin's disease.  However, his most recent PET scan showed  no evidence of recurrent disease, this was done October 03, 2007.  The  patient also has had stage IV CLL, which has been in remission since  November 2007.   Reading through the notes, it appears that the patient's 2 types of  cancers have been quite stable and does not require treatment in over 2  years.  Given this fact, I told Jak[place]ee, I thought Dr. Cornelius Moras would  proceed with his surgery.   REVIEW OF SYSTEMS:  His review of systems is otherwise negative.  He has  not had syncope.  He has chronic dyspnea.  There has been no chest pain.   ALLERGIES:  He has no known allergies.   MEDICATIONS:  He is on folic acid, multivitamins, and  p.r.n. Lasix.   PHYSICAL EXAMINATION:  GENERAL:  Remarkable for somewhat disheveled  overweight male in no distress.  VITAL SIGNS:  Blood pressure is 120/72, pulse 86 and regular,  respiratory 14, afebrile, and weight is 260.  HEENT:  Unremarkable.  NECK:  He has parvus and tardus in the carotids with a transmitted  murmur.  No lymphadenopathy, thyromegaly, or JVP elevation.  LUNGS:  Clear diaphragmatic motion.  No wheezing.  S1 is preserved.  S2  is diminished.  There is severe AS murmur.  PMI normal.  ABDOMEN:  Protuberant.   Bowel sounds positive.  No AAA.  No tenderness.  No bruit.  No hepatosplenomegaly.  No hepatojugular reflux.  EXTREMITIES:  Distal pulses are intact with trace edema.  NEURO:  Nonfocal.  No muscular weakness.  SKIN:  Warm and dry.  Cath site is well healed with only a minor  ecchymosis.  He has a Port-A-Cath under the left clavicle.   IMPRESSION:  1. Severe aortic stenosis to be consulted on by Dr. Cornelius Moras today.  2. A 50% right coronary artery disease.  We will likely need a bypass      to the right with his aortic valve replacement.  3. Stage III-B mixed cellularity Hodgkin's disease and stage IV      chronic lymphocytic leukemia in remission.  Followup with Dr.      [name], should not preclude aortic valve surgery.  4. Lower extremity edema.  Continue p.r.n. Lasix.  5. Port-A-Cath has been flushed recently and seems to be operating  fine.  I do not think it will      be an issue regarding his surgery.  Further recommendations to be      based on the results of Dr. Orvan July consult.  The patient will      likely follow up with one of my colleagues in [place] after his      aortic valve replacement and single-vessel bypass.     Noralyn Pick. Eden Emms, MD, [facility]  Electronically Signed    PCN/MedQ  DD: 10/26/2007  DT: 10/26/2007  Job #: [id number]

## 2010-08-11 NOTE — Consult Note (Signed)
NEW PATIENT CONSULTATION   [name], [name]  DOB:  07/28/1945                                        October 26, 2007  CHART #:  [id number]   Date of planned hospital admission, November 22, 2007   REASON FOR CONSULTATION:  Severe aortic stenosis.   HISTORY OF PRESENT ILLNESS:  The patient is a 65 year old obese white  male from [place], [place], with known history of aortic  stenosis for which he has been followed for several years by Dr. Charlton Haws.  The patient does not recall precisely when he was first told  that he had a heart murmur, but he thinks it was discovered at the time  he initially presented with chronic lymphocytic lymphoma in 2006.  He  has been followed by Dr. Charlton Haws for several years with serial  echocardiograms.  He describes progressive symptoms of mild-to-moderate  exertional shortness of breath.  These symptoms seem to wax and wane a  bit, although they are never severe.  The patient does report occasional  dizzy spells.  He has never had any syncopal episodes.  He denies any  resting shortness of breath.  He denies any PND, orthopnea, or  palpitations.  He has never had any chest pain.  He underwent recent  followup 2-D echocardiogram on Aug 18, 2007.  By report, this revealed  significant progression of aortic stenosis with peak velocity across the  valve measured 482 cm per second.  The peak and mean transvalvular  gradients were estimated 93 and 55 mmHg respectively, corresponding to  estimated aortic valve areas between 0.82 and 0.87 sq. cm.  There was  mild left ventricular dilatation.  Left ventricular ejection fraction  was estimated 55%.  There was moderate left ventricular wall thickness  hypertrophy.  The patient was seen in followup after this by Dr. Eden Emms  and subsequently scheduled for elective cardiac catheterization.  This  was performed on October 24, 2007.  Findings at the time of catheterization  confirmed  the presence of severe aortic stenosis.  The mean gradient  across the valve measured 41.5 mmHg with a peak-to-peak gradient of 44  mmHg.  The aortic valve area was estimated 0.9 sq. cm.  There was single-  vessel coronary artery disease with 50% proximal stenosis of right  coronary artery.  There was mild pulmonary hypertension with PA  pressures measured 38/22 with a pulmonary capillary wedge pressure of  18.  Baseline cardiac output was normal.  The patient has now been  referred for aortic valve replacement.   REVIEW OF SYSTEMS:  General:  The patient reports stable appetite.  He  has not been gaining or losing weight recently.  He is 5 feet 10 inches  tall and weighs approximately 251 pounds.  Cardiac:  Notable for  exertional shortness of breath and occasional dizzy spells.  The patient  denies resting shortness of breath, PND, orthopnea, palpitations, or  chest pain.  Extremities:  The patient has intermittent significant  lower extremity edema.  Respiratory:  Notable for persistent dry cough  with intermittent productive cough and recurrent episodes of bronchitis  off and on in the past.  The patient denies ongoing purulent sputum  production or hemoptysis.  Gastrointestinal:  Notable for some  difficulty swallowing.  The patient states that sometimes food gets  stuck when he is swallowing solid foods and he will occasionally have to  cough it back up.  He has no pain with swallowing and no difficulty  swallowing liquids.  He has never been formally evaluated for this.  He  denies any hematochezia, hematemesis, melena.  He reports some  occasional hemorrhoids.  Bowel function is regular.  Musculoskeletal:  Negative.  Neurologic:  Notable for numbness involving both feet.  This  is chronic and stable.  Genitourinary:  Notable for some problems with  urinating in the past, although none recently.  The patient has history  of kidney stones in the past, but none recently.   Psychiatric:  Negative.  HEENT:  Negative.  The patient has partial upper plate  dentures.  He has not seen a dentist in a while, but he reports no  active dental problems or issues.   PAST MEDICAL HISTORY:  1. Aortic stenosis.  2. Single-vessel coronary artery disease.  3. Hypertension.  4. Hyperlipidemia.  5. Obesity.  6. Chronic lymphocytic leukemia (stage IV).  7. Hodgkin disease (stage IIIB, mixed cellularity).  8. Varicose veins.  9. Kidney stones.  10.Venereal warts.  11.History of exposure to agent orange.   PAST SURGICAL HISTORY:  1. Left eye surgery.  2. Lithotripsy.   FAMILY HISTORY:  Noncontributory.   SOCIAL HISTORY:  The patient is married and lives with his wife in  [place].  He is disabled, having previously worked for [employer] and  [employer] in the past.  He is a nonsmoker.  He denies significant  alcohol consumption.   CURRENT MEDICATIONS:  1. Acyclovir 1 tablet twice daily.  2. Folic acid 1 tablet daily.  3. Multivitamin once daily.   DRUG ALLERGIES:  None known.   PHYSICAL EXAMINATION:  GENERAL:  The patient is a well-appearing obese  male who appears his stated age, in no acute distress.  VITAL SIGNS:  Blood pressure 121/78, pulse 92, oxygen saturation 97% on  room air.  HEENT:  Unrevealing.  NECK:  Supple.  There is no cervical nor supraclavicular  lymphadenopathy.  There is no jugular venous distention.  CHEST:  Auscultation of the chest reveals clear breath sounds which are  symmetrical bilaterally.  There is a subcutaneous port in the right  deltopectoral groove without any surrounding sign of infection or  problems.  CARDIOVASCULAR:  Regular rate and rhythm.  There is a grade 3/6  crescendo-decrescendo systolic murmur heard best along the sternal  border with radiation to the neck.  No diastolic murmurs are noted.  ABDOMEN:  Obese, but soft and nontender.  There are no palpable masses.  EXTREMITIES:  Warm and adequately perfused.   Femoral pulses are somewhat  diminished and difficult to appreciate.  There is moderate bilateral  lower extremity edema.  There is some chronic erythema in both lower  legs consistent with chronic venous insufficiency.  There are changes of  varicose veins in both lower legs.  Distal pulses are not palpable at  the ankle.  RECTAL AND GU:  Both deferred.  NEUROLOGIC:  Grossly nonfocal and symmetrical throughout.   DIAGNOSTIC TESTS:  A 2-D echocardiogram performed in May of this year is  reviewed.  This confirms the presence of severe aortic stenosis.  There  is moderate left ventricular hypertrophy.  There is normal left  ventricular systolic function.  No other significant abnormalities are  noted.   Cardiac catheterization performed October 24, 2007, is reviewed.  This  demonstrates severe aortic stenosis.  There is normal left ventricular  systolic function.  There is 50-60% irregular stenosis involving the  proximal right coronary artery.  There is right dominant coronary  circulation.  There is no significant disease in the left coronary  circulation.   IMPRESSION:  Severe aortic stenosis with single-vessel coronary artery  disease and stable symptoms of exertional shortness of breath.  I  believe that the patient would best be treated with elective aortic  valve replacement.  Although the right coronary artery stenosis is not  high grade, I do think it is probably severe enough to warrant  concomitant single-vessel coronary artery bypass grafting.  I do not  think that the patient would be a very good candidate for minimally  invasive techniques for his surgical treatment of his aortic valve  disease due to the need for concomitant coronary artery bypass surgery,  history of radiation therapy to both groins, diminished femoral pulses,  and significant obesity.   PLAN:  I have discussed options at length with the patient and his wife.  Alternative treatment strategies have been  discussed.  They understand  and accept all associated risks of surgery including, but not limited to  risk of death, stroke, myocardial infarction, congestive heart failure,  respiratory failure, pneumonia, bleeding requiring blood transfusion,  arrhythmia, heart block or bradycardia requiring permanent pacemaker,  recurrent coronary artery disease, late complications related to valve  replacement.  We have also discussed what type of valve to utilize at  the time of surgery.  The relative risks and benefits of use of a  mechanical valve versus a bioprosthetic tissue valve has been discussed  in detail.  After considerable discussion, the patient elects to proceed  with bioprosthetic tissue valve for aortic valve replacement.  I suspect  this is the best choice for him.  He understands that there is a small  chance of late structural valve deterioration failure depending upon his  longevity.  All of his questions have been addressed.  We will  tentatively plan to proceed with surgery on Wednesday, August 26.  We  will obtain an upper GI barium swallow to rule out the presence of  significant esophageal stricture prior to surgery.  We will see the  patient back in the office on Monday, August 24, to review the results  of barium swallow and make final plans.   Salvatore Decent. Cornelius Moras, M.D.  Electronically Signed   CHO/MEDQ  D:  10/26/2007  T:  10/27/2007  Job:  [id number]   cc:   Noralyn Pick. Eden Emms, MD, [facility]  [name]. [name], MD  Milus Mallick Lodema Hong, M.D.  Billie Lade, M.D.  Barbaraann Barthel, M.D.  Dr. Elmer Ramp

## 2010-08-11 NOTE — Assessment & Plan Note (Signed)
[facility]                       [place] CARDIOLOGY OFFICE NOTE   NAME:[name], [name] ADKISON                     MRN:          [id number]  DATE:12/22/2006                            DOB:          04-Jul-1945    [name] returns today for followup. I initially had seen him at the  request of Dr.  Lodema Hong for followup of his murmur and lower extremity  edema.   The patient has aortic stenosis. His 2D echocardiogram showed that the  valve was stable over the last couple of years and his left ventricular  function was normal. CW Doppler showed mild to perhaps moderate aortic  stenosis.   Unfortunately, Dr.  Dietrich Pates did not give me any gradients.   In talking to the patient, he has not been compliant with his Lasix. He  is taking it every other day. He says it makes him urinate too much. He  seems to think the erythema in his left leg is better. He has been  treated with Keflex and I believe an IM shot by Dr.  Lodema Hong for  cellulitis.   He has mild chronic exertional dyspnea. It sounds more functional. I do  not think it is related to is aortic valve. He has not had any change in  this. There has been no PND or orthopnea. He has lower extremity edema  which is now chronic. No diaphoresis. No pleuritic pain.   As indicated, he had a followup stress Myoview which was normal with an  EF of 77%. His echo showed good LV function and at most moderate AS. No  indication for surgery. Lower extremity duplex showed no evidence of  DVT. There was subcutaneous edema identified.   His examination is remarkable for an overweight white male in no  distress. His weight is 262. Blood pressure 110/78, pulse 76 and  regular. Respiratory rate is 14. He is afebrile.  HEENT: Is normal. Carotids are normal without bruits. There is no JVP  elevation. There is no thyromegaly. No lymphadenopathy.  LUNGS:  Are clear with good diaphragmatic motion.  No wheezing.  There is  an S1 and 2nd heart sound is preserved. There is a moderate AS  murmur. PMI is normal.  ABDOMEN: Is protuberant. Bowel sounds positive. No ascites. No  tenderness. No hepatosplenomegaly. No hepatojugular reflux. No  tenderness.  Femorals are +3 bilaterally. He continues to have +1-2 edema bilaterally  with improving, but persistent erythema in the left lower leg.  NEURO: Is nonfocal. There is no muscular weakness.  SKIN: Is warm and dry other than the erythema in the left lower  extremity. There are no ulcers.   CURRENT MEDICATIONS:  1. Lovastatin 80 a day.  2. Folic acid 1 mg a day.  3. Lasix will be 40 once a day.  4. Potassium 10 a day.   IMPRESSION:  1. Moderate aortic stenosis. No indication for surgery. Followup echo      in six months. I will try to actually review the echo myself to      assess his mean and peak gradients. He does not need SB  prophylaxis.  2. Dyspnea, functional. Not related to aortic stenosis. Good left      ventricular function. Continue to try to increase activity levels      and lower salt intake. No evidence of severe cardiopulmonary      disease in regards to his dyspnea.  3. Left lower extremity cellulitis. The patient apparently finished a      10 day course of Keflex. He still has erythema. I am somewhat      concerned by this. I explained to him the benefit of taking his      Lasix to reduce his edema and to help with healing. We will get him      an appointment to see Dr.  Lodema Hong next week. He may benefit from      an initial 10 days of doxycycline.  4. Hypercholesterolemia. Continue lovastatin 80 mg a day. No evidence      of coronary disease. This would be considered secondary prevention.   I will see the patient back in about six months. At some point he will  need a followup BMET and BNP.     Noralyn Pick. Eden Emms, MD, [facility]  Electronically Signed    PCN/MedQ  DD: 12/22/2006  DT: 12/22/2006  Job #: [id number]   cc:   Milus Mallick.  Lodema Hong, M.D.

## 2010-08-11 NOTE — Assessment & Plan Note (Signed)
[name]                       [name] CARDIOLOGY OFFICE NOTE   NAME:[name], [name]                     MRN:          [id number]  DATE:02/26/2008                            DOB:          1945-09-08    PRIMARY CARE PHYSICIAN:  Milus Mallick. Lodema Hong, MD.   REASON FOR VISIT:  Postsurgical followup.   HISTORY OF PRESENT ILLNESS:  I saw [name] back in October.  He  continues to recuperate following single-vessel coronary artery bypass  grafting and aortic valve replacement as outlined previously.  He is  formally enrolled in the cardiac rehabilitation program exercising on  Monday, Wednesday, and Friday.  He is not having any angina or limiting  breathlessness.  He still feels weak.  His electrocardiogram today  showed sinus rhythm at 69 beats per minute with a left bundle-branch  block pattern and left atrial enlargement.  He has had no sense of  palpitations and tolerated the decrease in amiodarone to once daily  following her last visit.  He continues on Coumadin and his INR assessed  in clinic today with the adjustments made.  We talked about his lipid  status and I note that he was previously on a statin medication with  significantly elevated LDL cholesterol greater than 200.  We talked  about reassessing this and treating appropriately as well.   ALLERGIES:  No known drug allergies.   MEDICATIONS:  1. Ocuvite daily.  2. Folic acid daily.  3. Lasix 20 mg p.o. p.r.n. (has not used).  4. Coumadin as per [facility].  5. Lopressor 25 mg p.o. t.i.d.  6. Pacerone 200 mg p.o. daily.  7. Aspirin 81 mg p.o. daily.  8. Centrum Silver daily.  9. Tramadol p.r.n.   REVIEW OF SYSTEMS:  As described in the history of present illness,  otherwise negative.   PHYSICAL EXAMINATION:  VITAL SIGNS:  Blood pressure is 100/60, heart  rate is 60, and weight is 238 pounds.  GENERAL:  He is an overweight obese male, in no acute distress.  HEENT:  Conjunctivae is normal.  Pharynx clear.  Poor dentition.  NECK:  Supple.  No elevated jugular venous pressure, loud bruits, or  thyromegaly.  LUNGS:  Clear with diminished breath sounds at bases.  No wheezing.  CARDIAC:  Regular rate and rhythm.  Soft systolic murmur, preserved  second heart sound.  Chest wall shows well healed mid sternal incision.  No significant erythema or drainage.  ABDOMEN:  Soft, nontender.  EXTREMITIES:  Exhibit no significant pitting edema.  Distal pulses 1+.  SKIN:  Warm and dry.  MUSCULOSKELETAL:  No kyphosis noted.  NEUROPSYCHIATRIC:  The patient is alert and oriented x3.  Affect is  appropriate.   IMPRESSION AND RECOMMENDATIONS:  1. History of severe aortic stenosis, status post aortic valve      replacement in June with a pericardial tissue valve.  He continues      on Coumadin.  He did have postoperative atrial fibrillation, but no      long-standing history.  He remains in sinus rhythm now and our plan  will be to discontinue amiodarone completely and advance his      Lopressor to 50 mg p.o. b.i.d.  I will plan to see him back over      the next 3 months.  2. Single-vessel obstructive coronary artery disease, status post      saphenous vein graft placement to the right coronary artery at time      of aortic valve surgery.  He continues the cardiac rehabilitation.      He will remain on aspirin and beta-blocker therapy and for further      risk reduction, we will check a lipid profile aiming towards      reinstituting statin therapy.  Ideally, his LDL should be around      70.     Jonelle Sidle, MD  Electronically Signed    SGM/MedQ  DD: 02/26/2008  DT: 02/27/2008  Job #: [id number]   cc:   Milus Mallick. Lodema Hong, M.D.

## 2010-08-11 NOTE — Cardiovascular Report (Signed)
[name], [name]              ACCOUNT NO.:  [id number]   MEDICAL RECORD NO.:  [id number]          PATIENT TYPE:  OIB   LOCATION:  1961                         FACILITY:  [facility]   PHYSICIAN:  [name] Ancona, MD      DATE OF BIRTH:  1945-04-06   DATE OF PROCEDURE:  10/24/2007  DATE OF DISCHARGE:  10/24/2007                            CARDIAC CATHETERIZATION   This heart catheterization was done with Dr. Rollene [name] as the  proctor.   Indication: Assess degree of AS, assess for CAD prior to aortic valve  replacement.   PROCEDURES:  Left heart catheterization, right heart catheterization,  and crossing of the aortic valve.   After informed consent was obtained, the right groin side was sterilely  prepped and draped.  The 1% lidocaine was used to locally anesthetize  the right groin site.  The right femoral artery was accessed using  Seldinger technique and a 7-French venous sheath was placed.  The right  common femoral artery was accessed Seldinger technique and a 4-French  arterial sheath was placed.  Right heart catheterization was carried out  using the balloon-tipped Swan-Ganz catheter, 2 samples were taken from  the pulmonary artery, and 1 sample was taken from the femoral artery for  oxygen saturation calculation.  Left heart catheterization was carried  out.  Next, the 4-French JL-4 catheter was used to engage the left  coronary artery.  The 4-french 3DRC catheter was used to engage the  right coronary artery.  A straight tip wire with the 4-French JR-4  catheter was used to cross the aortic valve, less than 2 minutes were  spent crossing the aortic valve.  There were no complications during the  procedure.  A left ventriculography was not done as the patient had had  a recent high quality echo.   FINDINGS:  1. The right heart catheterization.  Mean right atrial pressure 9      mmHg, RV 37/6 mmHg, PA 38/22 mmHg, mean 29 mmHg, mean pulmonary      capillary wedge  pressure 18 mmHg, LV 194/24 mmHg, aorta 144/75      mmHg.  Cardiac output is 7 and cardiac index was 3.  2. Coronary angiography.  The coronary system is right dominant.      There is a 50% discrete stenosis in the proximal RCA and the left      main has no significant disease.  Left circumflex has minor luminal      irregularities.  The LAD has minor luminal irregularities.  3. Aortic valve.  Aortic valve calculations, mean aortic valve      gradient is 41.5 mmHg and peak to peak gradient is 44 mmHg.  The      aortic valve was significantly calcified and the aortic valve area      by the Gorlin equation is 0.9 cm squared.   ASSESSMENT:  1. Severe aortic stenosis with the aortic valve area of 0.9 cm squared      and a mean gradient across the valve of 41.5 mmHg.  2. Coronary artery disease.  There is a 50% proximal  right coronary      artery stenosis.  3. Mildly elevated filling pressures.   PLAN:  Plan includes aortic valve replacement, would also consider  bypass of the RCA lesion at the time of aortic valve replacement.      [name] Ancona, MD  Electronically Signed     DM/MEDQ  D:  10/24/2007  T:  10/24/2007  Job:  [id number]   cc:   [name]. [name] Emms, MD, [facility]

## 2010-08-11 NOTE — Procedures (Signed)
NAMEHERSHEY, [name]              ACCOUNT NO.:  [id number]   MEDICAL RECORD NO.:  [id number]          PATIENT TYPE:  REC   LOCATION:  RAD                           FACILITY:  [facility]   PHYSICIAN:  Gerrit Friends. Dietrich Pates, MD, FACCDATE OF BIRTH:  06/28/1945   DATE OF PROCEDURE:  09/06/2006  DATE OF DISCHARGE:                                ECHOCARDIOGRAM   REFERRING:  1. Dr. Lodema Hong  2. Dr. [name].   CLINICAL DATA:  [age]-year-old gentleman with prior chemotherapy and  possible congestive heart failure; history of aortic valve disease.   M-MODE:  Aorta 3.3, left atrium 4.1, septum 2.1, posterior wall 1.5, LV  diastole 3.6, LV systole 2.6.   IMPRESSION:  1. Technically adequate echocardiographic study.  2. Left atrial size at the upper limit of normal; normal right atrium      and right ventricle.  3. Normal diameter of the proximal ascending aorta; mild calcification      of the wall.  4. Considerable calcification of the aortic valve; maximal leaflet      separation is reduced.  The continuous-wave Doppler study is      technically inadequate.  There appears to be mild to perhaps      moderate stenosis.  5. Normal pulmonic valve and proximal pulmonary artery.  6. Normal mitral and tricuspid valve; physiologic tricuspid      regurgitation; estimated right ventricular systolic pressure at the      upper limit of normal.  7. Normal left ventricular size; mild to moderate hypertrophy with      disproportionate upper septal thickening; normal regional and      global function.  8. Small posterior pericardial effusion.  9. Inferior vena cava not well imaged -- appears normal.  10.Comparison with prior study of June 24, 2005:  Mitral valve does      not clearly appear rheumatic as was suggested on the previous      examination; there has been no significant change in aortic valve      disease.      Gerrit Friends. Dietrich Pates, MD, [facility]  Electronically Signed     RMR/MEDQ  D:   09/08/2006  T:  09/08/2006  Job:  [id number]

## 2010-08-11 NOTE — Op Note (Signed)
NAME:  [name], [name]              ACCOUNT NO.:  [id number]   MEDICAL RECORD NO.:  [id number]          PATIENT TYPE:  AMB   LOCATION:  DAY                           FACILITY:  [facility]   PHYSICIAN:  R. Roetta Sessions, M.D. DATE OF BIRTH:  Dec 03, 1945   DATE OF PROCEDURE:  09/23/2006  DATE OF DISCHARGE:                               OPERATIVE REPORT   PROCEDURE:  Diagnostic colonoscopy.   INDICATIONS FOR PROCEDURE:  [name] is a pleasant 65 year old  gentleman sent over at the courtesy of Dr. [name] for  colorectal cancer screening.  He has never had his lower GI tract  evaluated.  There is no family history of colorectal disease.  Mr.  Zwahlen notes intermittent low-volume painless hematochezia from time-to-  time.  Colonoscopy is now being done to further evaluate these symptoms.  This approach has been discussed with the patient at length.  The  potential risks, benefits, and alternatives have been reviewed;  questions answered.  He is agreeable.  Please see the documentation in  the medical record.   PROCEDURE NOTE:  O2 saturation, blood pressure, pulse and respirations  were monitored throughout the entire procedure.   CONSCIOUS SEDATION:  Versed 4 mg IV, Demerol 75 mg IV in divided doses.   INSTRUMENT:  Pentex video chip system.   FINDINGS:  Digital rectal exam revealed no abnormalities.   ENDOSCOPIC FINDINGS:  The prep was adequate.   COLON:  The colonic mucosa was surveyed from the rectosigmoid junction  through the left transverse and right colon to the area of the  appendiceal orifice, ileocecal valve, and cecum.  These structures were  well seen and photographed for the record.  From this level the scope  was slowly withdrawn.  All previously mentioned mucosal surfaces were  again seen.  The patient had extensive left-sided diverticula.  The  remainder of the colonic mucosa appeared normal.   The scope was pulled down into the rectum where a thorough  examination  of the rectal mucosa with a retroflex view of the anal verge  demonstrated only some anal canal hemorrhoids.  The patient tolerated  the procedure well and was reacted in endoscopy.   IMPRESSION:  1. Anal canal hemorrhoids, otherwise normal rectum.  2. Extensive left-sided diverticula.  3. The remainder of the colonic because it appeared normal.   RECOMMENDATIONS:  1. Diverticulosis.  2. Hemorrhoid literature provided to [name].  3. Begin a daily fiber supplement, Metamucil, Benefiber, or Citrucel.  4. A 10-day course of Anusol suppositories, 1 per rectum at bedtime.  5. Patient to come back in 10 years for a repeat colonoscopy for      screening purposes.      Jonathon Bellows, M.D.  Electronically Signed     RMR/MEDQ  D:  09/23/2006  T:  09/23/2006  Job:  [id number]

## 2010-08-11 NOTE — Assessment & Plan Note (Signed)
[facility] HEALTHCARE                       [place] CARDIOLOGY OFFICE NOTE   NAME:Frances, ASHANTE SNELLING                     MRN:          [id number]  DATE:08/23/2007                            DOB:          12-May-1945    Mr. Reierson is seen today in follow-up.   He had a 2-D echocardiogram.  He has had progression of his aortic  stenosis, it is now severe; I believe his mean gradient was 55.   Unfortunately, I need to get a little bit more information about the  patient's cancer.  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   His previous echo by Dr. Dietrich Pates did not have any gradients listed in  the report.   However, he dictated on September 06, 2006, that he had moderate aortic  stenosis.  His gradients clearly qualify for severe aortic stenosis at  this point.  I had a long discussion with Casimiro Needle.  He is not really  ready to have any open heart surgery at this time.  His cancer needs to  be further delineated, both in terms of its extent and prognosis.  He is  significantly overweight with poor functional capacity.  I told him  initially I would refer him to a dietician and also to cardiac rehab to  see what he can do.  Ideally, he would have a firmer prognosis on the  CLL and have lost about 20 pounds before deciding to proceed with heart  cath and surgery.  I think mentally Mehki needs to postpone any  decision about this as well.   REVIEW OF SYSTEMS:  Remarkable for no significant chest pain or syncope.  He had a normal Myoview study and does not have evidence of coronary  disease.  He has not had any infections, diaphoresis or fevers.   CURRENT MEDICATIONS:  Include __________, folic acid, multivitamins.  He  refuses to take his Lasix and potassium due to weakness.   PHYSICAL EXAMINATION:  VITAL SIGNS:  Remarkable for an overweight white  male in no distress.  His weight is 260, blood pressure 110/68, pulse 76  and regular, afebrile.  HEENT:  Unremarkable.  NECK:   Carotids have parvus and tardus.  There is a referred murmur.  There is no lymphadenopathy, no thyromegaly.  LUNGS:  Clear with good diaphragmatic motion.  No wheezing.  There is a  severe aortic stenosis murmur with a late peaking systolic murmur and  decrease second heart sound.  There is no AI.  ABDOMEN:  Benign, bowel sounds positive.  No AAA, no tenderness, no  hepatosplenomegaly, no hepatojugular reflux.  EXTREMITIES:  Distal pulses are intact with +1 edema.  NEURO:  Nonfocal.  SKIN:  Warm and dry.  MUSCULOSKELETAL:  No muscle weakness.   IMPRESSION:  1. Severe aortic stenosis, not quite ready for surgical consideration.      Cardiac rehab and nutrition consult.  Will get notes from Dr.      [name] regarding his cancer.  Follow-up in two months.  2. Lower extremity edema.  The patient refuses to take Lasix.      Continue low-salt diet, elevate legs at  the end of the day.  3. Previous hypercholesterolemia.  Again, the patient not taking a      statin drug.  I am not quite sure why compliance is such an issue.      I also explained to him that this might retard any progression of      his aortic disease, but I think that the cat is out of the bag on      this one.   Again, I suspect that the patient will need surgery within the next  year.  I will try to get Dr. 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  He will need a right and  left heart cath before being referred to Cardiovascular Thoracic  Surgeons.     Noralyn Pick. Eden Emms, MD, [facility]  Electronically Signed    PCN/MedQ  DD: 08/23/2007  DT: 08/23/2007  Job #: [id number]

## 2010-08-11 NOTE — H&P (Signed)
[name], [name]              ACCOUNT NO.:  [id number]   MEDICAL RECORD NO.:  [id number]          PATIENT TYPE:  INP   LOCATION:  2304                         FACILITY:  [facility]   PHYSICIAN:  Guadalupe Maple, M.D.  DATE OF BIRTH:  [birth year]   DATE OF ADMISSION:  01/05/2008  DATE OF DISCHARGE:                              HISTORY & PHYSICAL   PROCEDURE:  Intraoperative transesophageal echocardiography.   Mr. Riyad Keena is a 65 year old white male with a history of severe  aortic stenosis and single-vessel coronary artery disease.  He was  scheduled to undergo aortic valve replacement and coronary artery bypass  grafting by Dr. Tressie Stalker.  Intraoperative Transesophageal  Echocardiography was requested to evaluate the aortic valve and to  determine if any other valvular pathology was present and to assess the  left and right ventricular function, and to assess the adequacy of the  aortic valve replacement.   The patient was brought to the operating room at [facility].  General anesthesia was induced without difficulty.  The trachea was  intubated without difficulty.  The patient had had a history of a  Schatzki's ring and had undergone previous esophageal dilatation.  Upon  insertion of the transesophageal echocardiography probe, there was no  resistance upon entering the esophagus but at the level of approximately  the gastroesophageal junction, resistance was encountered.  It was  elected not to advance the probe into the stomach.  The study was thus  limited to the mid esophageal and upper esophageal views.   IMPRESSION:  Prebypass findings:  1. Aortic valve.  Aortic valve was heavily calcified and appeared to      be trileaflet with severely restricted opening.  There was 1+      aortic insufficiency noted.  Aortic annulus measured 2.44 cm.  The      aortic root at the sinotubular ridge measured 2.56 cm.  2. Mitral valve.  The mitral annulus was markedly  calcified and the      base of the anterior mitral leaflet was heavily calcified.  There      was good coaptation of the leaflets and trace aortic insufficiency      noted.  Continuous wave Doppler interrogation of the mitral inflow      revealed a mean transmitral gradient of 3.5 mmHg.  3. Left ventricle.  The views of the left ventricle were limited to      the mid esophageal views, but there appeared to be good      contractility in all segments interrogated with normal ejection      fraction.  4. Right ventricle.  The right ventricular size appeared normal with      good contractility of right ventricular free wall and normal right      ventricular function.  5. Tricuspid valve.  The tricuspid valve appeared structurally intact      with trace to 1+ tricuspid insufficiency.  6. Interatrial septum.  The interatrial septum was intact without      evidence of patent foramen ovale or atrial septal defect by color  Doppler and bubble study.  7. Left atrium appeared to be no thrombus in the left atrium or left      atrial appendage.  8. Descending aorta.  The descending aorta appeared free of      significant atheromatous disease and measured 2.5 cm in diameter.   POSTBYPASS FINDINGS:  1. Aortic valve appeared to be a bioprosthetic valve noted in the      aortic position.  The valve appeared well seated with no aortic      insufficiency noted.  2. Mitral valve.  The mitral valve was unchanged from the prebypass      study.  There was severe calcification of the base of the anterior      leaflet and there was moderate-to-severe mitral annular      calcification with trace mitral insufficiency.  3. Left ventricle.  The left ventricular views were limited to the      upper midesophageal views, but there appeared      to be good contractility in all segments interrogated with normal      ejection fraction.  4. Right ventricle.  The right ventricular size appeared normal with       good contractility of the right ventricular free wall.           ______________________________  Guadalupe Maple, M.D.     DCJ/MEDQ  D:  01/06/2008  T:  01/06/2008  Job:  [id number]

## 2010-08-11 NOTE — Assessment & Plan Note (Signed)
[facility] HEALTHCARE                       [place] CARDIOLOGY OFFICE NOTE   NAME:[name], [name] ZIRBES                     MRN:          [id number]  DATE:01/24/2008                            DOB:          May 20, 1945    PRIMARY CARE PHYSICIAN:  Milus Mallick. Lodema Hong, MD   CARDIOTHORACIC SURGEON:  Salvatore Decent. Cornelius Moras, MD   REASON FOR VISIT:  Postsurgical followup.   HISTORY OF PRESENT ILLNESS:  This is my first meeting with [name] in  the office.  He has been followed by Dr. Eden Emms and was last seen in  July with a history of severe aortic stenosis and single vessel  obstructive coronary artery disease.  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  Ultimately, [name] was taken to the operating room earlier  this month and underwent placement of a saphenous vein graft to the  distal right coronary artery as well as a 23-mm Magna pericardial valve  in aortic position.  He was discharged on the 16th and reports  recuperating fairly well.  He is not having any unusual chest  discomfort, reporting generally improving postsurgical pain.  Breathing  has been stable.  He does have to sleep in certain positions to feel  comfortable, however.  He has not bothered by any progressive lower  extremity edema and is not on any diuretics at this time.  I reviewed  his medications and he remains on amiodarone at 200 mg p.o. b.i.d. with  a history of postoperative atrial fibrillation.  He is in sinus rhythm  on examination today.  He is now enrolled in the Coumadin Clinic here in  [place].  He states that he has been walking around his house and is  due for orientation with cardiac rehabilitation program.  He is also due  to see Dr. Cornelius Moras back on February 05, 2008 with a chest x-ray.   ALLERGIES:  No known drug allergies.   PRESENT  MEDICATIONS:  1. Amiodarone 200 mg p.o. b.i.d.  2. Folic acid 1 mg p.o. daily.  3. Lasix 40 mg p.o. p.r.n.  4. Coumadin as directed by the Coumadin Clinic.  5. Lopressor 25 mg p.o. t.i.d.  6. Amiodarone 200 mg p.o. b.i.d.  7. Aspirin 81 mg p.o. daily.  8. Centrum Silver daily.  9. Tramadol p.r.n.   REVIEW OF SYSTEMS:  As described in the history of present illness.  No  significant palpitations.  No active bleeding problems.  Otherwise  negative.   PHYSICAL EXAMINATION:  Blood pressure today is 113/72, heart rate is 80  and regular, weight is 242 pounds down from 260 in May of this year.  He is an obese male in no acute distress.  HEENT:  Conjunctiva is normal.  Pharynx is clear.  NECK:  Supple.  No elevated jugular venous pressure.  No audible bruits.  No thyromegaly is noted.  LUNGS:  Generally clear with decreased breath sounds at the bases, right  greater than left, likely some residual pleural fluid.  CARDIAC:  Regular rate and rhythm.  Soft, basal systolic murmur with  preserved second heart sound.  Examination of the chest wall shows a  healing mid sternal incision.  No erythema or drainage.  A few Steri-  Strips remain in the upper abdominal keyhole incisions.  There is no  drainage here either.  ABDOMEN:  Soft, nontender.  Bowel sounds are present.  EXTREMITIES:  Exhibit chronic 1+ edema that is symmetric.  His vein  harvest site is healing reasonably well.  Distal pulses are 1+.  SKIN:  Warm and dry.  MUSCULOSKELETAL:  No kyphosis noted.  NEUROPSYCHIATRIC:  The patient is alert and oriented x3.  Affect is  appropriate.   IMPRESSION AND RECOMMENDATION:  1. History of severe aortic stenosis, status post recent aortic valve      replacement in June with a 21-mm Magna pericardial valve.  He      continues on Coumadin at this time with concurrent history of      postoperative atrial fibrillation, although is in sinus rhythm on      amiodarone and beta-blocker therapy.   He will continue followup      through our Coumadin Clinic.  I did ask him to decrease amiodarone      to 200 mg once daily for the time being.  We can reassess long-term      need for this medicine down the road.  I will have him come back to      the office in 1 month for reevaluation.  At that point, he will      likely already be in cardiac rehabilitation.  He will follow up      with Dr. Cornelius Moras as scheduled with a chest x-ray.  2. Single-vessel obstructive coronary artery disease, status post      saphenous vein graft placement to the right coronary artery.  3. Reported history of hyperlipidemia.  I see that he was previously      taking lovastatin.  He is not on any statin medication at this      time.  I will have to explore this in more detail when he returns      to the office.     Jonelle Sidle, MD  Electronically Signed    SGM/MedQ  DD: 01/24/2008  DT: 01/25/2008  Job #: [id number]   cc:   Milus Mallick. Lodema Hong, M.D.  Salvatore Decent. Cornelius Moras, M.D.

## 2010-08-11 NOTE — Assessment & Plan Note (Signed)
OFFICE VISIT   [name], [name]  DOB:  1946/01/27                                        February 05, 2008  CHART #:  [id number]   HISTORY:  The patient is a 65 year old gentleman with a known history of  aortic stenosis, CLL, hypertension, and multiple other medical problems  who underwent a median sternotomy for aortic valve replacement with a 23-  mm Edwards Magna pericardial tissue valve as well as coronary artery  bypass grafting x1 on 01/05/2008, by Dr. Tressie Stalker.  He is seen on  today's date in routine office visit followup.  Currently, he reports  that he is making ongoing progress in regard to his recovery.  He denies  significant shortness of breath.  He is having only minor pain at this  point.  He does report some numbness in his fourth and fifth fingers  bilaterally consistent with some brachial plexus stretch injury, but  overall this is felt to be improving over time.  He denies fevers,  chills, or other constitutional symptoms.   CHEST X-RAY:  Chest x-ray was obtained on today's date and shows  improving bilateral effusions and bibasilar aeration.  There were no  acute findings.   PHYSICAL EXAMINATION:  VITAL SIGNS:  Blood pressure is 122/75, pulse 80,  respirations 18, and oxygen saturation is 97% on room air.  GENERAL:  This is a well-developed, adult, obese male in no acute  distress.  PULMONARY:  Clear breath sounds throughout.  CARDIAC:  Regular rate and rhythm, 2/6 systolic murmur.  Incisions are  all healed without evidence of infection.  EXTREMITIES:  Mild edema bilaterally.   ASSESSMENT:  The patient is making adequate ongoing recovery from his  surgery.  He has seen his cardiologist and they are adjusting his  medications and monitoring his Coumadin.  His amiodarone is noted to be  titrated down to 200 mg daily.  From our regard, we can now follow  him on a p.r.n. basis.  He is encouraged to start his rehabilitation  phase  2 program next week as scheduled.  He is instructed that he can  begin driving short distances.   Rowe Clack, P.A.-C.   Sherryll Burger  D:  02/05/2008  T:  02/05/2008  Job:  [id number]   cc:   [name]. [name], MD

## 2010-08-11 NOTE — Assessment & Plan Note (Signed)
[facility]                       [place] CARDIOLOGY OFFICE NOTE   NAME:Mair, SELMA RODELO                     MRN:          [id number]  DATE:06/24/2008                            DOB:          01/02/1946    CARDIOLOGIST:  Jonelle Sidle, MD   PRIMARY CARE PHYSICIAN:  Milus Mallick. Lodema Hong, MD   REASON FOR VISIT:  Three-month followup.   HISTORY OF PRESENT ILLNESS:  Mr. Trice is a 65 year old male with a  history of severe aortic stenosis status post 23-mm Magna pericardial  tissue aortic valve replacement in October 2009 accompanied by a single-  vessel CABG with vein graft to the distal RCA.  He last saw Dr. Diona Browner  in November.  He discontinued his amiodarone at that time and increased  his beta-blocker therapy.  The patient remains on Coumadin and also  continues to participate in cardiac rehab.  He returns for followup  today and notes that he is overall doing well.  He does have some  shortness of breath with exertion, but this is overall stable without  significant change.  He denies orthopnea, paroxysmal nocturnal dyspnea,  or worsening lower extremity edema.  He denies syncope.  He does have  some chest soreness related to his surgery, but otherwise no anginal  discomfort.  Review of his recent history indicates that he had a PET  scan with Dr. [name].  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  PET scan from  June 10, 2008, demonstrates no convincing evidence of recurrent  lymphoma.  His recent lab work dated June 07, 2008, demonstrates  hemoglobin of 12.9, platelet count of 144,000, potassium 4.5, creatinine  1.36.   CURRENT MEDICATIONS:  Accuvira 200 mg b.i.d., Folic acid 1 mg daily,  Lasix 40 mg half tablet p.r.n., Warfarin as directed, Metoprolol  tartrate 50 mg b.i.d., Aspirin 81 mg daily,  Centrum multivitamin daily, Crestor 40 mg daily, Fish oil 1 g 2  tablets  daily.   ALLERGIES:  No known drug allergies.   PHYSICAL EXAMINATION:  GENERAL:  He is a well-nourished, well-developed  male in no acute distress.  VITAL SIGNS:  Blood pressure is 118/65, pulse 68, weight 241 pounds.  HEENT:  Normal.  NECK:  Without JVD.  CARDIAC:  S1 and S2.  Regular rate and rhythm.  No obvious murmurs.  LUNGS:  Clear to auscultation bilaterally.  ABDOMEN:  Soft, nontender.  EXTREMITIES:  No edema.  NEUROLOGIC:  He is alert and oriented x3.  Cranial nerves II through XII  grossly intact.   Rhythm strip today demonstrates normal sinus rhythm with a heart rate of  63.   ASSESSMENT AND PLAN:  1. Severe aortic stenosis status post pericardial tissue aortic valve      replacement October 2009.  He is overall stable.  He requires no      further workup at this time.  2. Coronary artery disease status post single-vessel coronary artery      bypass graft at the time of his aortic valve replacement.  He is  having no anginal symptoms.  He will continue on aspirin therapy.  3. Postoperative paroxysmal atrial fibrillation.  He is now off      amiodarone and continues to maintain normal sinus rhythm.  In      looking through his records, his CHADS-2 score is really 1.  I will      discuss this further with Dr. Diona Browner when he returns to see if we      can possibly get Mr. Morre off of Coumadin in the future.      Otherwise, he will continue on his Coumadin therapy as directed.  4. Dyslipidemia.  His recent lipid panel demonstrated an LDL of 149.      We increased his Crestor to 40 mg a day.  We will recheck his      lipids and LFTs in early May to reassess this.   DISPOSITION:  He will be brought back in followup with Dr. Diona Browner in 3  months or sooner p.r.n.      Tereso Newcomer, PA-C  Electronically Signed      Jesse Sans. Daleen Squibb, MD, [facility]  Electronically Signed   SW/MedQ  DD: 06/24/2008  DT: 06/25/2008  Job #: [id number]   cc:   Milus Mallick.  Lodema Hong, M.D.

## 2010-08-11 NOTE — Assessment & Plan Note (Signed)
[facility] HEALTHCARE                       [place] CARDIOLOGY OFFICE NOTE   NAME:[name], [name] EIMERS                     MRN:          [id number]  DATE:11/16/2006                            DOB:          1946/03/22    [name] is a 65 year old patient referred by Dr. Lodema Hong for further  evaluation of murmur, aortic stenosis, and lower extremity edema. In  talking to the patient he has had a history of murmur that goes back a  few years. He has had regular echocardiograms here ordered by Dr.  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 His last echocardiogram which was read by Dr. Dietrich Pates  indicated mild-to-moderate aortic stenosis. He indicated that the  Doppler was somewhat substandard but the valve was considerably  calcified. Again unfortunately Dr. Dietrich Pates did not include any  gradients in his report.   The patient is a very sedentary person. He has chronic mild exertional  dyspnea. This really has not changed at all over the years. He has not  had any palpitations, PND, or orthopnea. Over the last 2 to 3 weeks he  has had fairly a marked increase in lower extremity edema. He has been  placed on Keflex for cellulitis. The edema has been particularly bad  over the last 2 to 3 weeks.   There is no history of DVT or previous cellulitis.   The patient has not had a history of congestive heart failure. His LV  function was normal by echo. There is no history of coronary artery  disease.   I had a lengthy discussion with [name] about his diagnosis of aortic  stenosis. We showed him a model so he understands what the problem is.  Despite the Doppler not being adequate, I think that he can have a  follow up echo in 6 months since the disease was clearly not described  as severe. I also explained to the patient what warning symptoms of his  aortic stenosis would be in regards to syncope, chest pain, and  shortness of  breath. I do not think his lower extremity edema is  related. When I talked to him about chest pain he did relate an incident  to me this weekend where he had substernal chest burning. It was not  with exertion. It happened after he had a sausage biscuit and it sounds  like he had some reflux. The pain has not recurred. When he had it, it  lasted about 20 minutes. It resolved spontaneously. It was severe at the  time.   However, again I suspect it was related to reflux or peptic ulcer  disease.   REVIEW OF SYSTEMS:  Otherwise negative.   PAST MEDICAL HISTORY:  Remarkable for hyperlipidemia, CLL and Hodkgin's  disease with previous chemo, cellulitis, balanitis, central obesity, and  now aortic stenosis.   He denies any allergies.   Meds:  Incomplete patient will call us with list from home.  Keflex,  Diuretic ? doses   He is happily married. He has 1 child who lives at home with him and 1  who  is out of the house and persuing a Masters Degree in psychology. Mr.  [name] himself seems somewhat slow. His wife actually has to explain a  lot of things to him. I do not know what his educational level is but  clearly there is some slow thought processes here. He is retired. He  used to work for [employer]. He is extremely sedentary, basically watching  T.V. and eating Popsicles all day.   FAMILY HISTORY:  Noncontributory.   He does not drink or smoke.   His exam is remarkable for an overweight middle aged white male in no  distress. Affect is appropriate. Mentation is slow. His weight is 261,  blood pressure is 110/80, pulse is 79 and regular, respiratory rate is  14, he is afebrile.  HEENT: Normal. Carotids have no parvus and no tardus. There is no  bruits. JVP is normal. There is no thyromegaly. No lymphadenopathy.  LUNGS: Clear with good diaphragmatic motion. No wheezing.  There is an S1. The second heart sound is somewhat muffled. There is a  mid peaking moderate aortic stenosis  murmur. There is no aortic  insufficiency. PMI is increased but not laterally displaced.  ABDOMEN: Protuberant. Bowel sounds are positive. There is no tenderness.  No triple A. No hepatosplenomegaly. No hepatojugular reflux.  Distal pulses are intact. He has +2 lower extremity edema bilaterally  with significant erythema.  NEURO: Nonfocal.  SKIN: Warm and dry outside of the lower extremities and there is no  muscular weakness.   His EKG shows sinus rhythm with poor R wave progression and left axis  deviation.   His triglycerides are elevated at 237. His HDL is low at 32 and his LDL  is 179.   IMPRESSION:  1. Murmur aortic stenosis, he has had multiple echocardiograms in the      past. I will have to go back and see how much progression he has      had in his aortic valve disease. His exam sounds moderate and I      suspect that we can do a follow up echocardiogram in 6 months. He      does not need SBE prophylaxis. He has normal left ventricular      function.  2. Lower extremity edema, this worries me somewhat. He is not on a      diuretic. It is fairly significant and is resulted in some      cellulitis. I placed him on Lasix 40 b.i.d. with potassium for the      next few weeks and I will see him back to further follow this up.      After the swelling subsides we may want to do a venous duplex to      assess for venous insufficiency postphlebitic syndrome.  3. Episode of chest pain likely reflux, however the patient has aortic      valve disease. He has some risk factors for coronary disease. I      think he should have an adenosine Myoview study. I explained to him      that if he has a valve problem he can not afford to have coronary      disease as well without knowing it and we will arrange for this.  4. Hyperlipidemia in the setting of aortic valve disease as well. High      dose lovastatin both for protection of coronary artery disease and      in some studies decreased  the  rate of progression of aortic      stenosis. Follow up lipid and liver profile in 6 months.  5. Cellulitis, continue Keflex 7 to 10 days per Dr. Lodema Hong.   Further recommendations will be based on the patient's responded to  diuretics and his stress test. He will definitely have a follow up  echocardiogram in 6 months.     Noralyn Pick. Eden Emms, MD, [facility]  Electronically Signed    PCN/MedQ  DD: 11/16/2006  DT: 11/17/2006  Job #: [id number]

## 2010-08-11 NOTE — Op Note (Signed)
NAMEDELORIS, [name]              ACCOUNT NO.:  [id number]   MEDICAL RECORD NO.:  [id number]          PATIENT TYPE:  INP   LOCATION:  2304                         FACILITY:  [facility]   PHYSICIAN:  Salvatore Decent. Cornelius Moras, M.D. DATE OF BIRTH:  01/31/1946   DATE OF PROCEDURE:  01/05/2008  DATE OF DISCHARGE:  01/01/2008                               OPERATIVE REPORT   PREOPERATIVE DIAGNOSES:  1. Severe aortic stenosis.  2. Single-vessel coronary artery disease.   POSTOPERATIVE DIAGNOSES:  1. Severe aortic stenosis.  2. Single-vessel coronary artery disease.   PROCEDURE:  Median sternotomy for aortic valve replacement (23-mm  Edwards Magna pericardial tissue valve) and coronary artery bypass  grafting x1 (saphenous vein graft to distal right coronary artery,  endoscopic saphenous vein harvest from right thigh)   SURGEON:  Salvatore Decent. Cornelius Moras, MD   ASSISTANT:  Coral Ceo, PA   ANESTHESIA:  General.   BRIEF CLINICAL NOTE:  The patient is a 65 year old obese male with known  history of aortic stenosis, CLL, hypertension and multiple other medical  problems.  The patient describes progressive symptoms of exertional  shortness of breath.  Echocardiogram demonstrates progression of aortic  stenosis to the point of being quite severe.  There is normal left  ventricular systolic function.  Left and right heart catheterization  confirmed the presence of severe aortic stenosis and document moderate  stenosis of the mid right coronary artery.  There is otherwise no  significant coronary artery disease.  A full consultation note has been  dictated previously.  The patient and his family had been counseled  regarding the indications, risks, and potential benefits of surgery.  Alternative treatment strategies have been discussed.  They understand  and accept all potential associated risks of surgery and desire to  proceed with surgery as described.   OPERATIVE FINDINGS:  1. Severe aortic  stenosis.  2. Normal left ventricular systolic function.  3. Moderate left ventricular hypertrophy.  4. Good-quality saphenous vein conduit for grafting.  5. Diffusely diseased right coronary artery with good target site for      grafting.   OPERATIVE NOTE IN DETAIL:  The patient is brought to the operating room  on the above-mentioned date and central monitoring was established by  the anesthesia team under the care and direction of Dr. Kipp Brood.  Specifically, a Swan-Ganz catheter is placed through the right internal  jugular approach.  A radial arterial line is placed.  Intravenous  antibiotics are administered.  Following induction with general  endotracheal anesthesia, a Foley catheter is placed.  The patient's  chest, abdomen, both groins, and both lower extremities are prepared and  draped in a sterile manner.  Baseline transesophageal echocardiogram is  performed by Dr. Noreene Larsson.  This confirms the presence of severe aortic  stenosis.  There is mild aortic insufficiency.  The mitral annulus is  calcified but there is no mitral regurgitation.  There is normal left  ventricular systolic function.  There is mild to moderate left  ventricular hypertrophy.   Greater saphenous vein is removed from the patient's right thigh using  endoscopic vein  harvest technique through a small incision made just  above the right knee.  The saphenous vein is felt to be good quality  conduit.  After the saphenous vein has been removed, the small incision  in the right thigh is closed in multiple layers with running absorbable  suture.   A median sternotomy incision is performed.  The pericardium is opened.  The ascending aorta is normal in appearance although the proximal  ascending aorta is foreshortened due to the patient's body habitus.  The  ascending aorta is cannulated for cardiopulmonary bypass at the level of  the innominate artery.  The venous cannula is placed through the right   atrium.  A retrograde cardioplegic catheter is placed through the right  atrium into the coronary sinus.  Adequate heparinization is verified.  Cardiopulmonary bypass is begun.  A left ventricular vent is placed  through the right superior pulmonary vein.  Distal target site for  bypass grafting is chosen.  A temperature probe is placed in the left  ventricular septum.  A cardioplegic catheter is placed in the ascending  aorta.   The patient is allowed to cool passively to 32 degrees systemic  temperature.  The aortic cross-clamp is applied and cold blood  cardioplegia is administered initially in an antegrade fashion through  the aortic root.  Iced saline slush is applied for topical hypothermia  and supplemental cardioplegia is administered retrograde through the  coronary sinus catheter.  The initial cardioplegic arrest and myocardial  cooling is felt to be excellent.  Repeat doses of cardioplegia are  administered intermittently throughout the cross-clamp portion of the  operation through the subsequently placed vein graft and retrograde  through the coronary sinus catheter to maintain left ventricular septal  temperature below 15 degrees centigrade.   The following distal coronary anastomoses is performed:  The distal  right coronary artery is grafted with a saphenous vein graft in end-to-  side fashion.  The right coronary artery is somewhat diffusely diseased  but at the site of distal grafting it measures 2.0 mm in diameter and is  a good-quality target vessel.   An oblique aortotomy incision is performed.  The aortic valve is  exposed.  The aortic valve is tricuspid and severely stenotic.  The left  main and the right coronary arteries are in their normal anatomical  location.  The aortic valve is excised sharply.  The aortic annulus is  decalcified.  There is fairly extensive annular calcification.  The  aortic root is irrigated with copious saline solution.  The aortic   annulus is sized to accept a 23-mm stented bioprosthetic tissue valve.   Aortic valve replacement is performed using interrupted 2-0 Ethibond  horizontal mattress pledgeted sutures with pledgets in the subannular  position.  An [facility] pericardial tissue valve (model number 3000,  serial number [id number]) is secured in place uneventfully.  The valve  seats without difficulty.  Rewarming is begun.   The aortotomy incision is closed using a two-layer closure of 4-0  Prolene suture over Teflon felt strips to buttress the repair.  The  single proximal saphenous vein anastomoses is performed directly to the  ascending aorta prior to removal of the aortic cross-clamp.  One final  dose of warm retrograde hot shot cardioplegia is administered.  The  lungs are ventilated and the heart allowed to fill to evacuate any  residual air through the aortic root.  The aortic cross-clamp is removed  after total cross-clamp time of 112  minutes.   The heart began to beat spontaneously without need for cardioversion.  The aortotomy and the vein graft are carefully inspected for meticulous  hemostasis.  The retrograde cardioplegic catheter is removed.  Epicardial pacing wires fixed to the right ventricular free wall into  the right atrial appendage.  The left ventricular vent is removed.  The  patient is rewarmed to 37 degrees centigrade temperature.  The patient  is weaned from cardiopulmonary bypass without difficulty.  The patient's  rhythm at separation from bypass is normal sinus rhythm.  No inotropic  support is required.  Total cardiopulmonary bypass time for the  operation is 143 minutes.   The venous and arterial cannulae are removed uneventfully.  Protamine is  administered to reverse anticoagulation.  The mediastinum is irrigated  with saline solution.  Meticulous surgical hemostasis is ascertained.  There is fairly diffuse coagulopathy.  The patient is transfused one  pack of adult platelets  and 2 units fresh frozen plasma due to continued  coagulopathy following reversal of heparin with protamine.  The  mediastinum is subsequently drained using two chest tubes exited through  separate stab incisions inferiorly.  The pericardium is reapproximated  loosely over the aorta.  The sternum is closed with double-strength  sternal wire.  The soft tissues anterior to the sternum are closed in  multiple layers and the skin is closed with a running subcuticular skin  closure.   The patient tolerated the procedure well and is transported to the  surgical intensive care unit in stable condition.  There are no  intraoperative complications.  All sponge, instrument and needle counts  are verified correct at completion of the operation.  The patient was  transfused 2 units of packed red blood cells following reversal of  heparin with protamine due to anemia and need for ongoing volume  requirement.      Salvatore Decent. Cornelius Moras, M.D.  Electronically Signed     CHO/MEDQ  D:  01/05/2008  T:  01/06/2008  Job:  [id number]   cc:   Noralyn Pick. Eden Emms, MD, [facility]  [name]. [name], MD  Milus Mallick Lodema Hong, M.D.  Billie Lade, M.D.  Barbaraann Barthel, M.D.

## 2010-08-11 NOTE — H&P (Signed)
HISTORY AND PHYSICAL EXAMINATION   January 01, 2008   Re:  [name], [name]        DOB:  12/02/1945   Date of planned hospital admission January 03, 2008.   HISTORY OF PRESENT ILLNESS:  The patient returns for further followup  today for severe aortic stenosis and single-vessel coronary artery  disease.  He was last seen here in the office on December 18, 2007, and  his original consultation visit was October 26, 2007.  Over the last couple  weeks, the patient has done well.  He denies any fevers or chills.  He  denies any resting shortness of breath.  He has not had any chest pain.  His swallowing has improved somewhat since his recent upper GI endoscopy  with esophageal dilatation.  He feels that he is ready to proceed with  surgery.  The remainder of his review of systems is unchanged from  previously.  The remainder of his past medical history is unchanged.   CURRENT MEDICATIONS:  Acyclovir 1 tablet twice daily, folic acid 1 mg  daily, and multivitamin 1 tablet daily.   DRUG ALLERGIES:  None known.   PHYSICAL EXAMINATION:  GENERAL:  The patient is a well-appearing obese  male.  VITAL SIGNS:  Blood pressure 133/83, pulse 83, and oxygen saturation 97%  on room air.  He is afebrile.  HEENT:  Unrevealing.  NECK:  Supple.  There are no carotid bruits.  There is no palpable  lymphadenopathy.  CHEST:  Auscultation of the chest demonstrates clear breath sounds,  which are symmetrical bilaterally.  No wheezes or rhonchi are noted.  CARDIOVASCULAR:  Notable for regular rate and rhythm.  There is a  prominent grade 3-4/6 crescendo-decrescendo systolic murmur heard best  along the right sternal border.  No diastolic murmurs are noted.  ABDOMEN:  Obese, soft, nondistended, and nontender.  There are no  palpable masses.  EXTREMITIES:  Warm and well perfused.  There is no lower extremity  edema.  Distal pulses are palpable at the ankle.  SKIN:  Clean, dry, and healthy  appearing throughout.  RECTAL AND GU:  Both deferred.  NEUROLOGIC:  Grossly nonfocal.   IMPRESSION:  Severe aortic stenosis with single-vessel coronary artery  disease.   PLAN:  We plan to proceed with elective aortic valve replacement and  coronary artery bypass grafting on Wednesday January 03, 2008.  The  patient and his wife have again been appraised regarding the  indications, risks, and potential benefits of surgery.  All of their  questions have been addressed.  We will plan to proceed with surgery as  described.  The patient continues to affirm his decision that he prefers  that we replace his aortic valve using a bioprosthetic tissue valve and  an effort to avoid the need for long-term anticoagulation with Coumadin.  He understands that with this, there is a small, but nonetheless  significant risk of late structural valve deterioration failure  depending upon his longevity.  All of his questions have been addressed.   Salvatore Decent. Cornelius Moras, M.D.  Electronically Signed   CHO/MEDQ  D:  01/01/2008  T:  01/01/2008  Job:  [id number]   cc:   Noralyn Pick. Eden Emms, MD, [facility]  [name]. [name], MD  Milus Mallick Lodema Hong, M.D.  Iva Boop, MD,FACG

## 2010-08-11 NOTE — Assessment & Plan Note (Signed)
[facility] HEALTHCARE                       [place] CARDIOLOGY OFFICE NOTE   NAME:[name], [name] LEDERMAN                     MRN:          [id number]  DATE:07/19/2007                            DOB:          [birth year]    HISTORY:  [name] returns today for followup.  He has had lower  extremity edema and varicosities. Last time I saw him, he had left lower  extremity cellulitis.  This has improved.  Unfortunately, Lasix has made  him feel weak.  He has not been taking it.   I explained to him that he really needs to be on at least 20 of Lasix a  day and 10 of potassium.  He has fairly significant lower extremity  edema which resulted in his last case of cellulitis.  We also talked  about him not going around with sandals.  He needs to wear good cotton  socks and covered shoes.   The patient otherwise has been doing well.  He has mild exertional  dyspnea.  He has moderate AS.  He needs a follow up echo.  I told him we  would do this in a month when we check his BMET and BMP after he has  resumed his diuretics.   REVIEW OF SYSTEMS:  Otherwise negative, particularly he has not had  chest pain, PND, orthopnea, no palpitations.   Meds:  List incomplete patient to call or we will contact pharmacy.   PHYSICAL EXAMINATION:  VITAL SIGNS:  Remarkable for weight which is down  from 262-254, blood pressure is 110/80, pulse 70 and regular,  respiratory rate 16, afebrile.  HEENT:  Unremarkable.  NECK:  Carotids are normal.  There is mild parvus.  There is a  transmitted murmur.  There is no JVP elevation, lymphadenopathy,  thyromegaly.  LUNGS:  Clear with good diaphragmatic motion.  No wheezing.  HEART:  There is an S1.  Second heart sound is preserved.  There is a  moderate AS murmur.  PMI normal.  ABDOMEN:  Benign.  Bowel sounds positive.  No AAA.  No tenderness.  No  hepatosplenomegaly or hepatojugular reflux.  EXTREMITIES:  Distal pulses are intact with +1-2 lower  extremity edema  and varicosities bilaterally.   IMPRESSION:  1. Lower extremity edema.  Resume Lasix 20 a day with 10 of potassium.      Elevate legs at the end of the day.  Cover feet with stocking and      shoe.  Low-salt diet.  2. Hypertension, currently well controlled.  Continue with diuretics.  3. Moderate aortic stenosis.  Follow up echo in a month.  Hopefully,      will not need surgery for      the next 3-5 years.  4. Hypercholesterolemia, previously on lovastatin.  Follow up lipid      and liver profile when he gets his BMET and BMP in a month.     Noralyn Pick. Eden Emms, MD, [facility]  Electronically Signed    PCN/MedQ  DD: 07/19/2007  DT: 07/19/2007  Job #: [id number]

## 2010-08-11 NOTE — H&P (Signed)
NAMETORREZ, RENFROE              ACCOUNT NO.:  [id number]   MEDICAL RECORD NO.:  [id number]          PATIENT TYPE:  AMB   LOCATION:  DAY                           FACILITY:  [facility]   PHYSICIAN:  Dennie Maizes, M.D.   DATE OF BIRTH:  07-04-1945   DATE OF ADMISSION:  10/10/2006  DATE OF DISCHARGE:  LH                              HISTORY & PHYSICAL   CHIEF COMPLAINT:  Recurrent balanitis.   HISTORY OF PRESENT ILLNESS:  This 65 year old has had several episodes  of balanitis since January 2008.  He had been treated with topical  antibiotics.  He has recurrent balanitis after stopping the antibiotic  treatment.  Denied having any voiding difficulty, hematuria or dysuria.  He has good urinary flow, urinary frequency x4 and nocturia x2.  He has  past history of urolithiasis and urinary tract infections.   PAST MEDICAL HISTORY:  1. History of recurrent balanitis.  2. Hyperlipidemia.  3. Hodgkin's disease.  4. Chronic lymphocytic leukemia.   MEDICATIONS:  Lovastatin 40 mg p.o. daily.   ALLERGIES:  No known drug allergies.   PHYSICAL EXAMINATION:  HEENT:  Normal.  NECK:  No masses.  LUNGS:  Clear to auscultation.  HEART:  Regular rate and rhythm, no murmurs.  ABDOMEN:  Soft, no palpable flank mass.  No CVA tenderness.  Bladder is  not palpable.  GENITALIA:  Recurrent balanitis with phimoses noted.  Testes are normal.  RECTAL:  Prostate benign at 23 g.   IMPRESSION:  Recurrent balanitis.   PLAN:  Circumcision under anesthesia in short-stay center.  I have  informed the patient regarding the diagnosis, operative details,  treatments and possible complications.  He has agreed for the procedure  to be done.      Dennie Maizes, M.D.  Electronically Signed     SK/MEDQ  D:  10/09/2006  T:  10/10/2006  Job:  [id number]   cc:   Milus Mallick. Lodema Hong, M.D.  Fax: [phone]   [facility]  Fax: [phone]

## 2010-08-11 NOTE — Assessment & Plan Note (Signed)
OFFICE VISIT   [name], [name]  DOB:  1945/12/11                                        December 18, 2007  CHART #:  [id number]   HISTORY OF PRESENT ILLNESS:  The patient returns for further followup of  severe aortic stenosis and single-vessel coronary artery disease.  He  was last seen here in the office on November 20, 2007.  Since then, he has  undergone upper GI endoscopy with dilatation of his lower esophageal  stricture by Dr. Stan Head last week.  He tolerated this procedure  well.  He thinks his swallowing may be slightly improved.  He still has  a dry nonproductive cough, but he reports that the febrile illness he  had complained of previously, appears to have resolved.  Overall, he  seems to be getting along okay.  He has no other specific complaints.  The remainder of his review of systems is unchanged from previously.   PHYSICAL EXAMINATION:  GENERAL:  Notable for well-appearing obese male.  VITAL SIGNS:  Blood pressure 108/77, pulse 98, oxygen saturation 98% on  room air, and temperature 97.1 degrees Fahrenheit.  HEENT:  Notable for  the absence of any palpable lymphadenopathy.  CHEST:  Auscultation of the chest reveals clear breath sounds, which are  symmetrical.  No wheezes or rhonchi are noted.  CARDIOVASCULAR:  Regular rate and rhythm.  There is a crescendo-  decrescendo murmur heard best at the right upper sternal border.  No  diastolic murmurs are noted.  ABDOMEN:  Soft, nondistended, and nontender.  EXTREMITIES:  Warm and well perfused.  There is no lower extremity  edema.   IMPRESSION:  The patient seems to be getting along fairly well and I  suspect that we could proceed with surgery at any point in time.   PLAN:  I have discussed matters at length with the patient.  He desires  to hold off until early October for surgery due to personal reasons.  We  will tentatively plan to proceed with surgery on  Wednesday, January 03, 2008.  I will see him in the office for further  followup on Monday, January 01, 2008, to make final plans for surgery at  that time.   Salvatore Decent. Cornelius Moras, M.D.  Electronically Signed   CHO/MEDQ  D:  12/18/2007  T:  12/19/2007  Job:  [id number]   cc:   Noralyn Pick. Eden Emms, MD, [facility]  [name]. [name], MD  Milus Mallick Lodema Hong, M.D.  Iva Boop, MD,FACG  Billie Lade, M.D.  Barbaraann Barthel, M.D.

## 2010-08-11 NOTE — Discharge Summary (Signed)
[name], [name]              ACCOUNT NO.:  [id number]   MEDICAL RECORD NO.:  [id number]          PATIENT TYPE:  INP   LOCATION:  2017                         FACILITY:  [facility]   PHYSICIAN:  Salvatore Decent. Cornelius Moras, M.D. DATE OF BIRTH:  08-Mar-1946   DATE OF ADMISSION:  01/05/2008  DATE OF DISCHARGE:  01/12/2008                               DISCHARGE SUMMARY   PRIMARY ADMITTING DIAGNOSIS:  Severe aortic stenosis.   ADDITIONAL/DISCHARGE DIAGNOSES:  1. Severe aortic stenosis.  2. Postoperative atrial fibrillation.  3. History of chronic lymphocytic leukemia.  4. Single-vessel coronary artery disease.  5. History of lower esophageal stricture status post recent endoscopy      with dilatation.  6. Hypertension.  7. Hyperlipidemia.  8. History of stage III B Hodgkin disease.  9. History of varicose veins.  10.History of kidney stones.  11.History of genital wart.   PROCEDURES PERFORMED:  1. Coronary artery bypass grafting x1 (saphenous vein graft to the      distal right coronary artery).  2. Aortic valve replacement with 23-mm Magna pericardial valve.  3. Saphenous vein graft EVH, right thigh.   HISTORY:  The patient is a 65 year old male with a known history of  severe aortic stenosis.  He has been followed for a number of years by  Dr. Charlton Haws with serial echocardiograms.  He recently has  experienced progressive symptoms of exertional shortness of breath.  He  had a followup echocardiogram on Aug 18, 2007, which showed significant  progression of his aortic stenosis.  He was subsequently scheduled for  an elective cardiac catheterization, which was performed in July 2009,  and confirmed the presence of severe aortic stenosis with single-vessel  coronary artery disease with a proximal 50% right coronary artery  stenosis.  Because of these findings and his worsening symptoms, he was  referred to Dr. Tressie Stalker as an outpatient consult for consideration  of surgical  revascularization and aortic valve replacement.  Dr. Cornelius Moras  reviewed his films and agreed that his best course of action would be to  proceed with AVR with a bioprosthetic tissue valve as well as single-  vessel bypass.  At that time, he also complained of some dysphagia and  it was felt that he should complete the GI workup prior to proceeding  with surgery.  In the interim, the patient saw Dr. Stan Head and  underwent esophageal dilatation and endoscopy.  He has remained stable  from a cardiac standpoint and on his most recent visit with Dr. Cornelius Moras on  January 01, 2008, it was felt that he should proceed with scheduling  surgery.  He was scheduled for outpatient elective admission on January 05, 2008.   HOSPITAL COURSE:  [name] was brought in [facility] on  January 05, 2008, and underwent CABG x1 and AVR as described in detail  above.  He tolerated the procedure well and was transferred to the SICU  in stable condition.  He was able to be extubated shortly after surgery.  He was hemodynamically stable and doing well on postop day #1.  He did  require transfusion of packed red blood cells for postoperative blood  loss anemia.  He remained in the unit for further observation.  His  chest tubes and hemodynamic monitoring devices were removed.  By postop  day #2, he was ready for transfer to the step-down unit.  His  postoperative course has been slightly prolonged secondary to recurrent  atrial fibrillation.  He was initially started on IV amiodarone, which  gave him significant nausea and vomiting.  Because he had converted to  sinus rhythm, this was discontinued and he was maintained on a beta  blocker alone.  However, when he developed recurrent atrial  fibrillation, he was started on p.o. amiodarone and seemed to tolerate  this without a problem.  Despite beta blocker therapy and amiodarone, he  continued to have brief paroxysmal atrial fibrillation, although he  remained  mostly in sinus rhythm.  Because of this, it was felt that he  should be started on Coumadin.  Presently, he is maintaining normal  sinus rhythm and is doing very well.  His anticoagulation has been  ongoing and presently, he has had an INR of 1.9 with a PT of 22.4.  He  has remained afebrile and his vital signs have been stable.  All of his  wounds are healing well.  His GI symptoms have completely resolved and  he is tolerating a regular diet.  He is ambulating in the halls with  cardiac rehab phase I and is making good progress.  His other labs have  remained stable with his most recent BMET showing sodium 132, potassium  3.7, BUN 15, creatinine 0.96.  CBC with hemoglobin 9.6, hematocrit 28.2,  white count 8.1, platelet count 98,000, which is improving since  surgery.  He has continued to progress well and it was felt that he may  be discharged to home at this time with close outpatient followup of his  INR.   Discharge medications are as follows:  1. Enteric-coated aspirin 81 mg daily.  2. Amiodarone 200 mg b.i.d.  3. Coumadin 2.5 mg daily until blood work is checked.  4. Lopressor 25 mg t.i.d.  5. Ultram 50-100 mg q.4 h. p.r.n. for pain.  6. Acyclovir 200 mg b.i.d.  7. Folic acid 1 mg daily.  8. Multivitamin daily.  9. Prilosec daily as directed at home.   DISCHARGE INSTRUCTIONS:  He is asked to refrain from driving, heavy  lifting, or strenuous activity.  He may continue ambulating daily and  using his incentive spirometer.  He may shower daily and clean his  incisions with soap and water.   DISCHARGE FOLLOWUP:  He will need to make an appointment with Dr. Eden Emms  in 2 weeks for a recheck.  He will be contacted by the [facility] office with  an appointment to see Dr. Cornelius Moras in 3 weeks with a chest x-ray.  We will  also have him follow up with [facility] on Monday, January 15, 2008, for management of his anticoagulation.  If he experiences any  problems or has questions  in the interim, he is asked to contact our  office.      Coral Ceo, P.A.      Salvatore Decent. Cornelius Moras, M.D.  Electronically Signed    GC/MEDQ  D:  01/12/2008  T:  01/12/2008  Job:  [id number]   cc:   Noralyn Pick. Eden Emms, MD, [facility]  [name]. [name], MD  Milus Mallick Lodema Hong, M.D.  Billie Lade, M.D.  Maryjean Morn.  Leone Payor, MD,FACG  Barbaraann Barthel, M.D.

## 2010-08-12 ENCOUNTER — Encounter: Payer: Self-pay | Admitting: Gastroenterology

## 2010-08-12 DIAGNOSIS — K219 Gastro-esophageal reflux disease without esophagitis: Secondary | ICD-10-CM

## 2010-08-12 DIAGNOSIS — R112 Nausea with vomiting, unspecified: Secondary | ICD-10-CM

## 2010-08-12 LAB — CBC
HCT: 34.6 % — ABNORMAL LOW (ref 39.0–52.0)
MCH: 32.8 pg (ref 26.0–34.0)
MCHC: 33.5 g/dL (ref 30.0–36.0)
RDW: 14.6 % (ref 11.5–15.5)

## 2010-08-12 LAB — URINE CULTURE

## 2010-08-12 LAB — BASIC METABOLIC PANEL
Calcium: 8.6 mg/dL (ref 8.4–10.5)
Creatinine, Ser: 0.96 mg/dL (ref 0.4–1.5)
GFR calc non Af Amer: >60 mL/min (ref >60)
Glucose, Bld: 126 mg/dL — ABNORMAL HIGH (ref 70–99)
Sodium: 140 mEq/L (ref 135–145)

## 2010-08-12 LAB — DIFFERENTIAL
Basophils Absolute: 0 10*3/uL (ref 0.0–0.1)
Eosinophils Absolute: 0.4 10*3/uL (ref 0.0–0.7)
Eosinophils Relative: 4 % (ref 0–5)
Monocytes Absolute: 0.6 10*3/uL (ref 0.1–1.0)

## 2010-08-12 NOTE — Progress Notes (Unsigned)
  Please set up for 3 mos f/u with [place]. Thanks

## 2010-08-12 NOTE — Discharge Summary (Signed)
  [name], [name]              ACCOUNT NO.:  [id number]  MEDICAL RECORD NO.:  [id number]           PATIENT TYPE:  I  LOCATION:  A220                          FACILITY:  [facility]  PHYSICIAN:  Wilson Singer, M.D.DATE OF BIRTH:  02/08/1946  DATE OF ADMISSION:  08/10/2010 DATE OF DISCHARGE:  05/16/2012LH                              DISCHARGE SUMMARY   FINAL DISCHARGE DIAGNOSES: 1. Prominent Schatzki ring with overlying distal esophageal erosions     and erosive reflux esophagitis and early stricture formation status     post dilatation. 2. Small hiatal hernia with normal stomach and duodenum. 3. History of coronary artery disease status post coronary artery     bypass graft. 4. History of aortic valve replacement. 5. History of Hodgkin disease in remission.  CONDITION ON DISCHARGE:  Stable.  MEDICATIONS ON DISCHARGE: 1. Protonix 40 mg b.i.d. 2. Acyclovir 200 mg every 12 hours. 3. Aspirin 81 mg daily. 4. Folic acid 1 mg daily. 5. Metoprolol 50 mg b.i.d. 6. Multivitamins 1 tablet daily. 7. Discontinue Prilosec.  HISTORY:  This very pleasant 65 year old man was admitted with nausea and vomiting.  Please see initial history and physical examination done by Dr. Osvaldo Shipper.  HOSPITAL PROGRESS:  It was felt that he may have an esophageal stricture and Dr. Jena Gauss, gastroenterologist was consulted.  Dr. Jena Gauss performed an upper GI endoscopy yesterday and found that he had a prominent Schatzki ring with distal esophageal erosions with erosive reflux esophagitis and early stricture formation.  Thankfully, this was a benign process of the post malignant.  He underwent dilatation and today feels very well.  He is able to swallow and has had no further nausea and vomiting.  He is able to keep food down.  PHYSICAL EXAMINATION:  VITAL SIGNS:  Today, temperature 97.7, blood pressure 126/75, pulse 74, saturation 99% on room air. GENERAL:  He looks systemically well. HEART:  Heart  sounds are present and normal. LUNGS:  Lung fields are clear. ABDOMEN:  Soft and nontender. NEUROLOGIC:  He is alert and oriented without any focal neurologic signs.  INVESTIGATIONS:  Today show a sodium of 140, potassium 3.7, bicarbonate 29, BUN 8, creatinine 0.96.  Hemoglobin 11.6 which is normocytic with an MCV of 97.7, white blood cell count 10.2, platelets 62 and this thrombocytopenia has been chronic.  DISPOSITION:  The patient is now medically stable to be discharged and he must continue on Protonix 40 mg twice a day until he sees Dr. Jena Gauss in 3 months' time.  I suspect he will need repeat a upper GI endoscopy at that point.  I have asked him to go and see his primary care physician in the next 1-2 weeks to make sure he is doing well.     Wilson Singer, M.D.     NCG/MEDQ  D:  08/12/2010  T:  08/12/2010  Job:  [id number]  cc:   Milus Mallick. Lodema Hong, M.D. Fax: [phone]  R. Roetta Sessions, M.D. [address] [place] [address]  Electronically Signed by Lilly Cove M.D. on 08/12/2010 01:45:11 PM

## 2010-08-13 ENCOUNTER — Encounter: Payer: Self-pay | Admitting: Gastroenterology

## 2010-08-14 NOTE — Procedures (Signed)
[name], [name]              ACCOUNT NO.:  [id number]   MEDICAL RECORD NO.:  [id number]          PATIENT TYPE:  OUT   LOCATION:  XRAY                         FACILITY:  [facility]   PHYSICIAN:  Edward L. Juanetta Gosling, M.D.DATE OF BIRTH:  Dec 13, 1945   DATE OF PROCEDURE:  DATE OF DISCHARGE:  08/24/2005                              PULMONARY FUNCTION TEST   1.  Spirometry shows no ventilatory defect and no evidence of airflow      obstruction.  2.  Lung volumes are normal.  3.  DLCO is moderately reduced.  Comparison to study of May 28, 2005 shows      slightly improved volumes.  The total lung capacity has decreased      slightly and the DLCO has also decreased slightly.      Edward L. Juanetta Gosling, M.D.  Electronically Signed     ELH/MEDQ  D:  08/28/2005  T:  08/28/2005  Job:  [id number]   cc:   [name]. [name], MD  Fax: [phone]

## 2010-08-17 NOTE — Op Note (Signed)
  NAMEBARKLEY, [name]              ACCOUNT NO.:  [id number]  MEDICAL RECORD NO.:  [id number]           PATIENT TYPE:  I  LOCATION:  A220                          FACILITY:  [facility]  PHYSICIAN:  R. Roetta Sessions, M.D. DATE OF BIRTH:  01-19-1946  DATE OF PROCEDURE:  08/11/2010 DATE OF DISCHARGE:                              OPERATIVE REPORT   INDICATIONS FOR PROCEDURE:  A 65 year old gentleman with longstanding GERD, recurrent esophageal dysphagia, history of esophageal dilation previously, has had clinically, what sounds like a transient food impactions recently, perhaps one protracted episode leading to nausea, vomiting and hospitalization.  He has been taking aspiration therapy only sporadically recently.  EGD etc., now being performed.  Risks, benefits, limitations, alternatives, imponderables have been discussed, questions are answered.  Please see the documentation of medical record.  PROCEDURE NOTE:  O2 saturation, blood pressure, pulse respirations were monitored throughout the entire procedure.  CONSCIOUS SEDATION:  Versed 4 mg, IV Demerol 75 mg IV in divided doses. Cetacaine spray for topical pharyngeal anesthesia.  INSTRUMENT:  Pentax video chip system.  FINDINGS:  Examination of tubular esophagus revealed a prominent Schatzki's ring with overlying inflammation and appeared to be development of early component of strictures overlying the ring.  The scope traversed the GE junction, but it was a little bit tight.  There was no Barrett's esophagus.  There was no extrinsic compression observed.  Stomach:  Gas cavity was emptied, insufflated well with air. Thorough examination of the gastric mucosa including the retroflexed view of the proximal stomach esophagogastric junction demonstrated only a small hiatal hernia.  Pylorus was patent and easily traversed. Examination of the bulb and second portion revealed no abnormalities.  THERAPEUTIC/DIAGNOSTIC MANEUVERS PERFORMED:   Scope was withdrawn.  A 56- French Maloney dilator was passed to full insertion with moderate resistance upon full insertion.  A look back revealed a ring stricture been dilated nicely with minimal bleeding without apparent complication. Please see photos.  The patient tolerated the procedure well.  IMPRESSION: 1. Prominent Schatzki's ring with overlying distal esophageal erosions     with erosive reflux esophagitis and early stricture formation,     status post dilation disruption as described above. 2. Small hiatal hernia, otherwise normal stomach, D1, D2.  RECOMMENDATIONS: 1. Soft diet. 2. PPI therapy b.i.d. 3. Hopefully, he will enjoy dramatic improvement in his GI symptoms.     He may or may not need subsequent dilation down the road.  I would     leave him on b.i.d. acid suppression therapy.  We would recommend     he return to see [place] in the office for followup in 3 months.     Jonathon Bellows, M.D.     RMR/MEDQ  D:  08/11/2010  T:  08/11/2010  Job:  [id number]  cc:   Milus Mallick. Lodema Hong, M.D. Fax: [phone]  Jonelle Sidle, MD [id number] [address] [place], [place] [address]  [facility]  Electronically Signed by Lorrin Goodell M.D. on 08/17/2010 10:05:03 AM

## 2010-08-18 ENCOUNTER — Encounter: Payer: Self-pay | Admitting: Family Medicine

## 2010-08-18 ENCOUNTER — Ambulatory Visit (INDEPENDENT_AMBULATORY_CARE_PROVIDER_SITE_OTHER): Payer: Medicare Other | Admitting: Family Medicine

## 2010-08-18 VITALS — BP 120/70 | HR 72 | Resp 16 | Ht 70.0 in | Wt 253.1 lb

## 2010-08-18 DIAGNOSIS — K219 Gastro-esophageal reflux disease without esophagitis: Secondary | ICD-10-CM

## 2010-08-18 DIAGNOSIS — I10 Essential (primary) hypertension: Secondary | ICD-10-CM

## 2010-08-18 DIAGNOSIS — E785 Hyperlipidemia, unspecified: Secondary | ICD-10-CM

## 2010-08-18 NOTE — Progress Notes (Signed)
  Subjective:    Patient ID: [name], male    DOB: [birth year], 65 y.o.   MRN: [id number]  HPI Pt having financial difficulty getting the protonix, taking prilosec 20mg  twice daily instead , he has been told he needs to spk directly with GI about this if not on $4 list. He is here for f/u recent hospitalization from 5/14 to 08/12/2010 for severe GERD with early stricture, esophageal erosions and esophagitis, he had presented with nausea and vomiting. Fells much better now, no more vomitting, however unable to afford med recommended.   Review of Systems Denies recent fever or chills. Denies sinus pressure, nasal congestion, ear pain or sore throat. Denies chest congestion, productive cough or wheezing. Denies chest pains, palpitations, paroxysmal nocturnal dyspnea, orthopnea and leg swelling Denies dysuria, frequency, hesitancy or incontinence. Denies joint pain, swelling and limitation in mobility. Denies headaches, seizure, numbness, or tingling. Denies depression, anxiety or insomnia. Denies skin break down or rash.        Objective:   Physical Exam Patient alert and oriented and in no Cardiopulmonary distress.  HEENT: No facial asymmetry, EOMI, no sinus tenderness, TM's clear, Oropharynx pink and moist.  Neck supple no adenopathy.  Chest: Clear to auscultation bilaterally.  CVS: S1, S2 systolic murmur, no S3.  ABD: Soft mild epigastric tendenss, no guarding or rebound. Bowel sounds normal.  Ext: No edema  MS: Adequate ROM spine, shoulders, hips and knees.  Skin: Intact, no ulcerations or rash noted.  Psych: Good eye contact, normal affect. Memory intact not anxious or depressed appearing.  CNS: CN 2-12 intact, power, tone and sensation normal throughout.        Assessment & Plan:

## 2010-08-18 NOTE — Patient Instructions (Addendum)
F/u in 4 months,  You absolutely need to be in touch with Dr Kendell Bane re the problem with your protonix so he can let you know of an alternative.  We will try and provide you with crestor SAMPLES AND NEED FASTING LABS IN 4 MONTHS

## 2010-08-20 ENCOUNTER — Telehealth: Payer: Self-pay

## 2010-08-20 NOTE — Telephone Encounter (Signed)
Pt was given protonix at discharge from hospital. He has no rx insurance and protonix is 150.00. Pt wants to know if he can just continue the prilosec bid that he was taking. He stated it was working ok, when he went to the ed he had not taking prilosec for 4 days and he feels that is why he had such a hard time. Pt uses [facility], please advise

## 2010-08-21 DIAGNOSIS — K222 Esophageal obstruction: Secondary | ICD-10-CM

## 2010-08-21 DIAGNOSIS — K449 Diaphragmatic hernia without obstruction or gangrene: Secondary | ICD-10-CM

## 2010-08-21 DIAGNOSIS — K219 Gastro-esophageal reflux disease without esophagitis: Secondary | ICD-10-CM

## 2010-08-21 DIAGNOSIS — R1310 Dysphagia, unspecified: Secondary | ICD-10-CM

## 2010-08-23 DIAGNOSIS — K219 Gastro-esophageal reflux disease without esophagitis: Secondary | ICD-10-CM | POA: Insufficient documentation

## 2010-08-23 NOTE — Assessment & Plan Note (Signed)
Controlled, no change in medication  

## 2010-08-23 NOTE — Assessment & Plan Note (Signed)
Deteriorated, recently hospitalized for intractale vomitting with stricture, f/u with gI stressed

## 2010-08-23 NOTE — Assessment & Plan Note (Signed)
Deteriorated, non compliant with medication, impt of same stressed, will attempt o help obtain medication

## 2010-08-24 NOTE — Telephone Encounter (Signed)
Ok; pt can go back to prilosec 20 mg BID (generic ok) disp #60 w 6 mos refill; stop protonix

## 2010-08-25 NOTE — Telephone Encounter (Signed)
Tried to call pt- LMOM 

## 2010-08-26 NOTE — Progress Notes (Signed)
Pt is aware of OV on 11/13/10 at 1030 with [facility]

## 2010-08-26 NOTE — Telephone Encounter (Signed)
Pt aware.

## 2010-08-27 ENCOUNTER — Telehealth: Payer: Self-pay | Admitting: Family Medicine

## 2010-08-27 NOTE — Telephone Encounter (Signed)
Patient aware samples available for pick-up  

## 2010-09-03 NOTE — H&P (Signed)
[name], CASELLA              ACCOUNT NO.:  [id number]  MEDICAL RECORD NO.:  [id number]           PATIENT TYPE:  E  LOCATION:  [facility]                          FACILITY:  [facility]  PHYSICIAN:  Osvaldo Shipper, MD     DATE OF BIRTH:  08-19-1945  DATE OF ADMISSION:  08/10/2010 DATE OF DISCHARGE:  LH                             HISTORY & PHYSICAL   PRIMARY CARE PHYSICIAN:  Milus Mallick. Lodema Hong, MD  CARDIOLOGIST:  [facility], Jonelle Sidle, MD  ADMISSION DIAGNOSES: 1. Nausea, vomiting, possible distal esophageal stricture. 2. History of coronary artery disease status post coronary artery     bypass graft. 3. History of aortic valve replacement. 4. History of Hodgkin disease in remission. 5. Urinary tract infection. 6. Thrombocytopenia, which is chronic.  CHIEF COMPLAINT:  Nausea and vomiting since last night.  HISTORY OF PRESENT ILLNESS:  The patient is a 65 year old Caucasian male who has a past medical history of coronary artery disease who was in his usual state of health until "Sunday night when he started having episodes of nausea and vomiting.  It is mostly when he eats that he has these episodes.  He has had about 10 episodes since yesterday.  He says he ate out last night; however, the other family that ate out with him did not have any episodes such as these.  He had a few episodes of diarrhea as well but that has resolved.  He tells me that he does have a history of acid reflux and takes Prilosec over the counter but has not done so in the last 3-4 days.  He also has been throwing up any pills that he take, any liquids that he consumes.  Denies any chest pain or shortness of breath.  No headaches.  No fever or chills.  He had a normal bowel movement today.  He tells me had an endoscopy in 2009 with which he had dilation of his esophagus.  MEDICATIONS AT HOME:  Based on Dr. Simpson's note from March.  He is on the following: 1. Acyclovir 200 mg b.i.d. 2.  Folic acid 1 mg daily. 3. Multivitamins 1 tablet daily. 4. Metoprolol 50 mg 1 tablet two times a day. 5. Aspirin 81 mg daily. 6. Crestor 20 mg 2 tablets at bedtime. 7. Aleve 220 mg 2 tablets once daily as needed.  ALLERGIES:  No known drug allergies.  Past medical history is positive for: 1. Hodgkin disease, in remission.  He had chemotherapy, etc., up until     20" 07 and now he just follows up with Dr. [name]. 2. He has a history of coronary artery disease status post CABG.  He     had a one-vessel bypass in 2009. 3. He had severe aortic stenosis and is status post aortic valve     replacement, currently not on Coumadin.  He has a history of eye surgeries in the past.  He has had esophageal dilatation in 2009.  As I discussed, this was done by Dr. Leone Payor in [place].  SOCIAL HISTORY:  [place] wife and son.  No smoking, alcohol, or illicit  drug use.  He is independent with his daily activities, however, does feel tired at the end of the day.  FAMILY HISTORY:  Positive for unknown cancer and macular degeneration.  REVIEW OF SYSTEMS:  GENERAL:  Positive for weakness, malaise.  HEENT: Unremarkable.  CARDIOVASCULAR:  Unremarkable.  GI:  As in HPI.  GU: Unremarkable.  NEUROLOGIC:  Unremarkable.  PSYCHIATRIC:  Unremarkable. DERMATOLOGIC:  Unremarkable.  Other systems reviewed and found to be negative.  PHYSICAL EXAMINATION:  VITAL SIGNS:  Temperature 99.0, blood pressure 159/71, heart rate 74, respiratory rate 18, saturation 97% on room air. GENERAL:  This is an obese white male in no distress. HEENT:  Head is normocephalic, atraumatic.  Pupils are equal reacting. No pallor.  No icterus.  Mucous membranes are moist.  No oral lesions are noted. NECK:  Soft and supple.  No thyromegaly is appreciated.  No cervical, supraclavicular, inguinal lymphadenopathy is present. LUNGS:  Clear to auscultation bilaterally with no wheezing, rales,  or rhonchi. CARDIOVASCULAR:  S1 and S2 is normal and regular.  No S3, S4.  No rubs, murmurs, or bruits. ABDOMEN:  Soft, nontender, nondistended.  Bowel sounds are present.  No masses or organomegaly is appreciated. GU: Deferred. MUSCULOSKELETAL:  Normal muscle mass and tone. NEUROLOGIC:  He is alert and oriented x3.  No focal neurological deficits are present. SKIN:  Does not reveal any rashes.  LABORATORY DATA:  His CBC shows a white count of 12.3, hemoglobin is 12.5, MCV is 96, platelet count is 77 which is chronic.  His electrolytes are all unremarkable.  Glucose is 121.  LFTs are normal. Lipase is 41.  Cardiac enzymes negative x1.  UA shows specific gravity greater than 1.030.  He has moderate leukocytes, negative nitrite, 11-20 wbc's, few bacteria.  He had a portable chest x-ray which showed no active cardiopulmonary disease.  He had an EKG done which shows sinus rhythm at 91, normal axis and intervals.  He does have evidence for left bundle-branch block.  This is compared to previous EKG from 2010 and changes are all old.  ASSESSMENT:  This is a 65 year old Caucasian male with nausea and vomiting.  I do not think there is any obstruction; however, I feel that this could be related to esophageal stricture.  He does not have any chest pain or shortness of breath.  His EKG is unchanged, so unlikely to be a cardiac presentation.  However, he did fail a trial of liquids in the ED.  This is despite symptomatic treatment.  PLAN: 1. Nausea with vomiting.  We will get acute abdominal series to make     sure there is no intra-abdominal obstruction, although I considered     this less likely.  We will put him on PPI b.i.d.  We will consult     GI as the patient may require endoscopy. 2. UTI will be treat with ceftriaxone.  Urine cultures will be     obtained. 3. Dehydration will be treated with IV fluids. 4. History of coronary artery disease and aortic valve replacement is      stable.  I will put him on intravenous beta-blocker for now until     he is n.p.o. 5. Chronic thrombocytopenia, etiology is unclear, it is probably     related to his Hodgkin disease.  No further evaluation at this     time.  He is on acyclovir he tells me ever since he was diagnosed with Hodgkin's, so the reason is not entirely clear.  We will continue it for now.  Further management decisions will depend on results of further testing and patient's response to treatment.  Osvaldo Shipper, MD     GK/MEDQ  D:  08/11/2010  T:  08/11/2010  Job:  [id number]  cc:   R. Roetta Sessions, M.D. [address]  [place] [address]  Milus Mallick. Lodema Hong, M.D. Fax: [phone]  Electronically Signed by Osvaldo Shipper MD on 09/03/2010 10:25:11 PM

## 2010-09-08 ENCOUNTER — Encounter (HOSPITAL_COMMUNITY): Payer: Self-pay | Admitting: *Deleted

## 2010-09-10 ENCOUNTER — Encounter (HOSPITAL_COMMUNITY): Payer: Medicare Other

## 2010-09-14 ENCOUNTER — Other Ambulatory Visit (HOSPITAL_COMMUNITY): Payer: Self-pay | Admitting: Oncology

## 2010-09-14 DIAGNOSIS — C9110 Chronic lymphocytic leukemia of B-cell type not having achieved remission: Secondary | ICD-10-CM

## 2010-09-17 ENCOUNTER — Encounter (HOSPITAL_COMMUNITY): Payer: Medicare Other

## 2010-09-24 ENCOUNTER — Encounter: Payer: Self-pay | Admitting: Family Medicine

## 2010-09-25 ENCOUNTER — Ambulatory Visit: Payer: No Typology Code available for payment source | Admitting: Family Medicine

## 2010-10-02 ENCOUNTER — Encounter (HOSPITAL_COMMUNITY): Payer: Medicare Other | Attending: Oncology

## 2010-10-02 ENCOUNTER — Other Ambulatory Visit (HOSPITAL_COMMUNITY): Payer: Self-pay | Admitting: Oncology

## 2010-10-02 ENCOUNTER — Ambulatory Visit (HOSPITAL_COMMUNITY)
Admission: RE | Admit: 2010-10-02 | Discharge: 2010-10-02 | Disposition: A | Discharge status: Home / Self Care | Payer: Medicare Other | Source: Ambulatory Visit | Attending: Oncology | Admitting: Oncology

## 2010-10-02 DIAGNOSIS — R599 Enlarged lymph nodes, unspecified: Secondary | ICD-10-CM | POA: Insufficient documentation

## 2010-10-02 DIAGNOSIS — C8129 Mixed cellularity classical Hodgkin lymphoma, extranodal and solid organ sites: Secondary | ICD-10-CM | POA: Insufficient documentation

## 2010-10-02 DIAGNOSIS — R161 Splenomegaly, not elsewhere classified: Secondary | ICD-10-CM | POA: Insufficient documentation

## 2010-10-02 DIAGNOSIS — R932 Abnormal findings on diagnostic imaging of liver and biliary tract: Secondary | ICD-10-CM | POA: Insufficient documentation

## 2010-10-02 DIAGNOSIS — C8190 Hodgkin lymphoma, unspecified, unspecified site: Secondary | ICD-10-CM

## 2010-10-02 DIAGNOSIS — C9110 Chronic lymphocytic leukemia of B-cell type not having achieved remission: Secondary | ICD-10-CM | POA: Insufficient documentation

## 2010-10-02 DIAGNOSIS — R918 Other nonspecific abnormal finding of lung field: Secondary | ICD-10-CM | POA: Insufficient documentation

## 2010-10-02 DIAGNOSIS — Z79899 Other long term (current) drug therapy: Secondary | ICD-10-CM | POA: Insufficient documentation

## 2010-10-02 DIAGNOSIS — N200 Calculus of kidney: Secondary | ICD-10-CM | POA: Insufficient documentation

## 2010-10-02 LAB — CBC
HCT: 35.5 % — ABNORMAL LOW (ref 39.0–52.0)
Hemoglobin: 12.1 g/dL — ABNORMAL LOW (ref 13.0–17.0)
MCH: 33.9 pg (ref 26.0–34.0)
MCV: 99.4 fL (ref 78.0–100.0)
Platelets: 82 10*3/uL — ABNORMAL LOW (ref 150–400)
RBC: 3.57 MIL/uL — ABNORMAL LOW (ref 4.22–5.81)
WBC: 19.8 10*3/uL — ABNORMAL HIGH (ref 4.0–10.5)

## 2010-10-02 LAB — COMPREHENSIVE METABOLIC PANEL
ALT: 18 U/L (ref 0–53)
AST: 19 U/L (ref 0–37)
Albumin: 3.7 g/dL (ref 3.5–5.2)
Calcium: 8.7 mg/dL (ref 8.4–10.5)
Creatinine, Ser: 1.12 mg/dL (ref 0.50–1.35)
GFR calc non Af Amer: >60 mL/min (ref >60)
Sodium: 142 mEq/L (ref 135–145)
Total Protein: 6.3 g/dL (ref 6.0–8.3)

## 2010-10-02 LAB — DIFFERENTIAL
Basophils Absolute: 0 10*3/uL (ref 0.0–0.1)
Basophils Relative: 0 % (ref 0–1)
Eosinophils Absolute: 0 10*3/uL (ref 0.0–0.7)
Eosinophils Relative: 0 % (ref 0–5)
Metamyelocytes Relative: 0 %
Monocytes Absolute: 0.6 10*3/uL (ref 0.1–1.0)
Monocytes Relative: 3 % (ref 3–12)
Myelocytes: 0 %
nRBC: 0 /100 WBC

## 2010-10-02 LAB — SEDIMENTATION RATE: Sed Rate: 5 mm/hr (ref 0–16)

## 2010-10-02 MED ORDER — IOHEXOL 300 MG/ML  SOLN
100.0000 mL | Freq: Once | INTRAMUSCULAR | Status: AC | PRN
  Administered 2010-10-02: 100 mL via INTRAVENOUS

## 2010-10-05 ENCOUNTER — Ambulatory Visit (HOSPITAL_COMMUNITY): Payer: Medicare Other

## 2010-10-05 ENCOUNTER — Encounter (HOSPITAL_COMMUNITY): Payer: Self-pay | Admitting: *Deleted

## 2010-10-06 ENCOUNTER — Encounter (HOSPITAL_COMMUNITY): Payer: Medicare Other | Admitting: Oncology

## 2010-10-06 ENCOUNTER — Encounter (HOSPITAL_BASED_OUTPATIENT_CLINIC_OR_DEPARTMENT_OTHER): Payer: Medicare Other

## 2010-10-06 ENCOUNTER — Other Ambulatory Visit (HOSPITAL_COMMUNITY): Payer: Self-pay | Admitting: Oncology

## 2010-10-06 DIAGNOSIS — C9110 Chronic lymphocytic leukemia of B-cell type not having achieved remission: Secondary | ICD-10-CM

## 2010-10-06 DIAGNOSIS — C8190 Hodgkin lymphoma, unspecified, unspecified site: Secondary | ICD-10-CM

## 2010-10-12 NOTE — Consult Note (Signed)
NAMEHENRY, UTSEY              ACCOUNT NO.:  [id number]  MEDICAL RECORD NO.:  [id number]           PATIENT TYPE:  I  LOCATION:  A220                          FACILITY:  [facility]  PHYSICIAN:  R. Roetta Sessions, M.D. DATE OF BIRTH:  [birth year]  DATE OF CONSULTATION: DATE OF DISCHARGE:                                CONSULTATION   REQUESTING PHYSICIAN:  [facility].  PRIMARY CARE PHYSICIAN:  Milus Mallick. Lodema Hong, MD  CARDIOLOGIST:  Jonelle Sidle, MD  HISTORY OF PRESENT ILLNESS:  Mr. Hofman is a 65 year old Caucasian male who was in his usual state of health until 2 days ago.  He had just had Viennese bread.  He then began to have significant nausea followed by vomiting.  He had been off his Prilosec for about 4 days as he ran out. He generally takes Prilosec 20 mg daily.  He had nausea and vomiting about 10 times total in the last 2 days.  He has resumed Prilosec 20 mg daily in the last 2 days prior to coming to the hospital.  He complains of regurgitation of undigested food within seconds every time he eats or drinks anything.  He does have heartburn and indigestion as well as frequent throat clearing.  He occasionally complains of dysphagia with solid food and feels that they get stuck in the back of his throat and upper esophagus.  He denies any problems with liquids.  He tells me his symptoms are generally better when he is taking his Prilosec.  He denies any odynophagia.  He has been chewing slowly and taking his time eating. He believes he may have had some low-grade fever but cannot quantify. He denies any hematemesis, rectal bleeding, or melena.  He did have 2-3 loose stools over the last 2 days.  His appetite, however, has been okay up to this point.  His weight has been stable.  He denies any ill contacts.  He was admitted for intractable nausea, vomiting, urinary tract infection, and dehydration.  He had an acute abdominal series which was  normal.  He has a history of distal esophageal stricture which was dilated in 2009 to 20-mm by Dr. Stan Head in [place].  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 Stan Head, December 12, 2007.  He had a colonoscopy September 23, 2006 which showed anal canal hemorrhoids and left diverticulosis.  He has coronary artery disease status post CABG 2009.  He had aortic valve replacement for severe aortic stenosis.  He has history of CLL and Hodgkin's lymphoma in remission, status post chemoradiation followed by Dr. [name], chronic thrombocytopenia and splenomegaly, multiple eye surgeries, renal lithiasis, circumcision in 1987.  MEDICATIONS PRIOR TO ADMISSION: 1. Prilosec 20 mg daily. 2. Aspirin 81 mg daily. 3. Acyclovir 200 mg b.i.d. 4. Folic acid 1 mg daily. 5. Multivitamin daily. 6. Toprol 50 mg b.i.d. 7. Crestor 20 mg 2 at bedtime. 8. Aleve 220 mg 2 tablets once daily p.r.n.  ALLERGIES:  No known drug allergies.  FAMILY HISTORY:  There  is no known family history of colorectal carcinoma or chronic GI problems.  SOCIAL HISTORY:  Mr. Koranda is married.  He is disabled. He has 2 grown healthy children.  Denies tobacco, alcohol, or drug use.  REVIEW OF SYSTEMS:  See HPI.  GU:  He has had some bloody discharge on his penis where he had previous circumcision.  Otherwise negative review of systems.  PHYSICAL EXAMINATION:  VITAL SIGNS:  Temperature 98.6, pulse 79, respirations 24, blood pressure 129/76, O2 sat 100% on 2 liters per minute. GENERAL:  He is 65 year old Caucasian male who is alert, oriented, cooperative in no acute distress. HEENT:  Sclerae clear, nonicteric.  Conjunctivae pink.  Oropharynx pink and moist without lesions. NECK:  Supple without mass or thyromegaly. HEART:  Regular rate and rhythm.  Normal S1 and S2 without any murmurs, clicks, rubs, or gallops. LUNGS:  Clear to  auscultation bilaterally. ABDOMEN:  Protuberant.  Positive bowel sounds x4.  No bruits auscultated. ABDOMEN:  Soft, nontender.  He does have palpable splenomegaly, unable to palpate hepatomegaly.  Exam is limited given the patient's body habitus. EXTREMITIES:  He has 1+ lower extremity edema bilaterally. SKIN:  Warm and dry without any rash or jaundice.  LABORATORY STUDIES:  Hemoglobin 11.3, hematocrit 33.3, platelets 66,000. White blood cell count 10.5, INR 1.08.  CMP is normal for glucose 123. Cardiac markers negative and urinalysis positive for urinary tract infection.  IMPRESSION:  Mr. Valeriano is a 65 year old Caucasian male with history of chronic gastroesophageal reflux disease, recent nausea, vomiting, and regurgitation in the setting of being off PPI for several days.  He has history of esophageal dilatation December 12, 2007 for distal esophageal ring which was dilated to 20-mm.  I suspect he could have recurrent esophageal ring.  Other differentials include erosive esophagitis, gastritis, or extrinsic compression from recurrent lymphoma, peptic ulcer disease, and gastric outlet obstruction.  He has chronic thrombocytopenia and splenomegaly.  Current platelet count is 66,000.  PLAN: 1. EGD with possible esophageal dilatation today with Dr. Jena Gauss.  I     discussed the procedure including risks, benefits to include, but     not limited to bleeding, infection, perforation, or drug reaction.     He agrees with plan     and consent will be obtained.  He is to remain n.p.o. until     procedure. 2. Agree with Protonix 40 mg b.i.d. 3. Further recommendations to follow up with Dr. [name] as     well as Dr. Jena Gauss.     Lorenza Burton, N.P.   ______________________________ R. Roetta Sessions, M.D.    KJ/MEDQ  D:  08/11/2010  T:  08/11/2010  Job:  [id number]  cc:   Milus Mallick. Lodema Hong, M.D. Fax: [phone]  Jonelle Sidle, MD [phone] N. [address] [place], [place]  [address]  Electronically Signed by Lorenza Burton N.P. on 09/28/2010 04:34:22 PM Electronically Signed by Lorrin Goodell M.D. on 10/12/2010 09:20:53 AM

## 2010-10-26 ENCOUNTER — Other Ambulatory Visit: Payer: Self-pay | Admitting: Cardiology

## 2010-10-26 LAB — LIPID PANEL
HDL: 23 mg/dL — ABNORMAL LOW (ref >39)
LDL Cholesterol: 117 mg/dL — ABNORMAL HIGH (ref 0–99)
Total CHOL/HDL Ratio: 9.3 Ratio

## 2010-10-26 LAB — HEPATIC FUNCTION PANEL
Albumin: 4.2 g/dL (ref 3.5–5.2)
Alkaline Phosphatase: 77 U/L (ref 39–117)
Bilirubin, Direct: 0.1 mg/dL (ref 0.0–0.3)
Total Bilirubin: 0.6 mg/dL (ref 0.3–1.2)

## 2010-10-30 ENCOUNTER — Ambulatory Visit (INDEPENDENT_AMBULATORY_CARE_PROVIDER_SITE_OTHER): Payer: Medicare Other | Admitting: Cardiology

## 2010-10-30 ENCOUNTER — Encounter: Payer: Self-pay | Admitting: Cardiology

## 2010-10-30 VITALS — BP 149/78 | HR 76 | Resp 20 | Ht 70.0 in | Wt 254.4 lb

## 2010-10-30 DIAGNOSIS — Z954 Presence of other heart-valve replacement: Secondary | ICD-10-CM

## 2010-10-30 DIAGNOSIS — I4891 Unspecified atrial fibrillation: Secondary | ICD-10-CM

## 2010-10-30 DIAGNOSIS — E782 Mixed hyperlipidemia: Secondary | ICD-10-CM

## 2010-10-30 DIAGNOSIS — I2510 Atherosclerotic heart disease of native coronary artery without angina pectoris: Secondary | ICD-10-CM

## 2010-10-30 DIAGNOSIS — I10 Essential (primary) hypertension: Secondary | ICD-10-CM

## 2010-10-30 MED ORDER — ROSUVASTATIN CALCIUM 40 MG PO TABS: 40.0000 mg | ORAL_TABLET | Freq: Every day | ORAL | Status: DC

## 2010-10-30 NOTE — Progress Notes (Signed)
Clinical Summary Mr. Crall is a 65 y.o.male presenting for followup. He was seen in February of this year.  Recent lab work from July 30 showed cholesterol 213, triglycerides 367, HDL 23, LDL 117. Trend continues to improve despite intermittent use of Crestor.  He denies any significant angina, no palpitations or syncope. Echocardiogram from last year is reviewed below.  He does report some recent GI problems, reflux and apparently an esophageal stricture.   No Known Allergies  Medication list reviewed.  Past Medical History  Diagnosis Date  . CLL (chronic lymphoblastic leukemia)   . Hodgkin's disease   . Aortic stenosis   . Atrial fibrillation   . Coronary atherosclerosis of native coronary artery   . Hyperlipidemia   . Essential hypertension, benign   . BPH (benign prostatic hypertrophy)     Past Surgical History  Procedure Date  . Eye surgery   . 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 Aortic valve replacement 10/09    23mm Magna Pericardial     Family History  Problem Relation Age of Onset  . Heart failure Mother   . Blindness Mother   . Cancer Father     Lung   . Heart attack Brother 55    Social History Mr. Retana reports that he has never smoked. He has never used smokeless tobacco. Mr. Mcmillon reports that he does not drink alcohol.  Review of Systems Otherwise negative.  Physical Examination Filed Vitals:   10/30/10 0825  BP: 149/78  Pulse: 76  Resp: 20   Morbidly obese male in no acute distress.  HEENT: Conjunctiva and lids normal, oropharynx with poor dentition.  Neck: Supple, no elevated jugular venous pressure or bruits.  Lungs: Clear to auscultation, nonlabored.  Cardiac: Regular rate and rhythm, S4, soft systolic murmur at the base, no S3.  Abdomen: Obese, nontender, bowel sounds present.  Extremities: Venous stasis noted, 1-2+ edema, distal pulses diminished.  Skin: Warm and dry, scattered excoriations.  Musculoskeletal: No  gross deformities.  Neuropsychiatric: Alert and oriented x3, affect grossly normal.   ECG Normal sinus rhythm at 75 with PR interval 212 ms, left bundle branch block which is old.  Studies Echocardiogram 11/04/2009: - Left ventricle: The cavity size was normal. Wall thickness was  increased in a pattern of mild LVH. There was moderate basal  hypertrophy of the septum. Systolic function was normal. The  estimated ejection fraction was in the range of 60% to 65%. Wall  motion was normal; there were no regional wall motion  abnormalities.  - Aortic valve: A bioprosthesis was present and functioning  normally. The prosthesis had a normal range of motion. The sewing  ring appeared normal.  - Mitral valve: Moderately to severely calcified annulus. Base of  the anterior leaflet is thickened and calcified. Valve area by  pressure half-time: 1.96cm 2.  - Left atrium: The atrium was mildly dilated.  - Atrial septum: No defect or patent foramen ovale was identified.  - Pulmonary arteries: PA peak pressure: 35mm Hg (S).   Problem List and Plan

## 2010-10-30 NOTE — Assessment & Plan Note (Signed)
Exam is stable, echocardiogram from last year reviewed.

## 2010-10-30 NOTE — Assessment & Plan Note (Signed)
LDL trend continues to get better despite intermittent use of Crestor. Samples were provided.

## 2010-10-30 NOTE — Patient Instructions (Signed)
**Note De-Identified Dominyck Reser Obfuscation** Your physician has recommended you make the following change in your medication: increase Crestor to 40 mg at bedtime  Your physician recommends that you return for lab work in: 6 months, just before next visit  Your physician recommends that you schedule a follow-up appointment in: 6 months

## 2010-10-30 NOTE — Assessment & Plan Note (Signed)
No active angina. Continue medical therapy and observation. 

## 2010-10-30 NOTE — Assessment & Plan Note (Signed)
Blood pressure elevated. We discussed diet, sodium restriction, weight loss. Also mentioned additional pharmacologic therapy. An ACE inhibitor would be a good next step if needed.

## 2010-11-05 ENCOUNTER — Other Ambulatory Visit: Payer: Self-pay

## 2010-11-05 MED ORDER — ROSUVASTATIN CALCIUM 40 MG PO TABS: 40.0000 mg | ORAL_TABLET | Freq: Every day | ORAL | Status: DC

## 2010-11-11 ENCOUNTER — Encounter (HOSPITAL_COMMUNITY): Payer: Self-pay | Admitting: Oncology

## 2010-11-11 ENCOUNTER — Encounter (HOSPITAL_COMMUNITY): Payer: Medicare Other | Attending: Oncology | Admitting: Oncology

## 2010-11-11 ENCOUNTER — Ambulatory Visit (HOSPITAL_COMMUNITY): Payer: Medicare Other | Admitting: Oncology

## 2010-11-11 VITALS — BP 144/81 | HR 72 | Temp 98.0°F | Wt 256.0 lb

## 2010-11-11 DIAGNOSIS — C9110 Chronic lymphocytic leukemia of B-cell type not having achieved remission: Secondary | ICD-10-CM | POA: Insufficient documentation

## 2010-11-11 DIAGNOSIS — C8129 Mixed cellularity classical Hodgkin lymphoma, extranodal and solid organ sites: Secondary | ICD-10-CM

## 2010-11-11 NOTE — Patient Instructions (Signed)
[facility]  Discharge Instructions  RECOMMENDATIONS MADE BY THE CONSULTANT AND ANY TEST RESULTS WILL BE SENT TO YOUR REFERRING DOCTOR.   EXAM FINDINGS BY MD TODAY AND SIGNS AND SYMPTOMS TO REPORT TO CLINIC OR PRIMARY WU:[id number] PRESCRIBED:   Your CLL has  Come back. Labs every 21 days x 3 times. We will see you at the end of the 9 weeks.    I acknowledge that I have been informed and understand all the instructions given to me and received a copy. I do not have any more questions at this time, but understand that I may call the Specialty Clinic at [facility] at [phone] during business hours should I have any further questions or need assistance in obtaining follow-up care.    __________________________________________  _____________  __________ Signature of Patient or Authorized Representative            Date                   Time    __________________________________________ Nurse's Signature

## 2010-11-11 NOTE — Progress Notes (Signed)
This office note has been dictated.

## 2010-11-12 NOTE — Progress Notes (Signed)
CC:   [name]. Lodema Hong, M.D. Billie Lade, Ph.D., M.D. Miachel Roux, MD Barbaraann Barthel, M.D.  DIAGNOSIS:  Return of chronic lymphocytic leukemia verified by blood flow cytometry that was on 10/06/2010 and that was evaluated by Dr. Guerry Bruin at [place].  His specimens sent on his peripheral blood after his white count came back mildly elevated as a change.  It showed that he had 76% abnormal cells in a gated population that were CD5, C19, CD20, CD21, CD22, CD23 and HLA DR and lambda positive.  His recent scans which were done on 10/02/2010 of the abdomen, pelvis and chest also showed that he had mildly enlarged lymph nodes in the chest and abdomen and a spleen that has been large in the past, now is slightly larger than a year ago.  He, however today is asymptomatic without fevers, chills or night sweats.  He is not aware of any lumps anywhere.  He is still weak and very tired.  He has trouble bending over.  He cannot touch his toes.  He can come about within a foot to the floor after bending over.  He has to pull his own feet up to cross his legs. He cannot do it spontaneously. He has symptoms of peripheral neuropathy with tingling and numbness in his fingers and toes.  It is probably grade 1 at best based upon his symptomatology.  But overall he is weak, he is tired and does not have B symptoms now.  His, of course, second problem is history of mixed cellularity Hodgkin's disease. That was diagnosed in September 2006.  His CLL of course was diagnosed initially before that and treated years ago with fludarabine, Cytoxan and Rituxan.  That was in 2004.  So, he now has a return of his CLL.  PHYSICAL EXAMINATION:  Still shows that his weight is excessive for his height of 256 pounds.  He certainly has not lost any weight compared to several months ago.  He is afebrile.  His respiratory rate is 20 and unlabored, pulse 72 and regular.  He is a chronically  ill-appearing gentleman in my opinion.  He has no obvious lymphadenopathy in the cervical region.  There is a question of a left supraclavicular node that is about 10 mm.  He has no axillary nodes.  No obvious inguinal nodes.  He does have a spleen which is palpable approximately 4-5 cm below the costal margin in the left midclavicular line.  His liver is not palpably enlarged.  Bowel sounds are diminished.  His heart shows a regular rhythm and rate without distinct S3 gallop or murmur.  He has a little puffiness of both ankles.  His skin is unremarkable.  He does not look particularly pale.  His color however, just does not appear normal. It looks more sallow.  His bowels he states are still sluggish but they are okay.  His appetite is only okay.  It is not great.  His sense of well-being is poor and his performance status in my opinion is close to a 3.  I really do not think this gentleman is capable of work.  I think his CLL coming back is not an optimally good sign and is worrisome, so I want to check a CBC and diff on him every 3 weeks for the next 9 weeks and see him back right after the 9 week laboratory value.  I have asked him to bring his wife at that time since I am concerned about this  gentleman now.  If I get a chance I will discuss this case with Dr. Miachel Roux at [facility]    ______________________________ [name]. [name], MD ESN/MEDQ  D:  11/11/2010  T:  11/12/2010  Job:  [id number]

## 2010-11-13 ENCOUNTER — Ambulatory Visit: Payer: No Typology Code available for payment source | Admitting: Gastroenterology

## 2010-11-13 ENCOUNTER — Other Ambulatory Visit (HOSPITAL_COMMUNITY): Payer: Medicare Other

## 2010-11-17 ENCOUNTER — Inpatient Hospital Stay (HOSPITAL_COMMUNITY)
Admission: EM | Admit: 2010-11-17 | Discharge: 2010-11-19 | DRG: 194 | Disposition: A | Discharge status: Home / Self Care | Payer: Medicare Other | Attending: Internal Medicine | Admitting: Internal Medicine

## 2010-11-17 ENCOUNTER — Emergency Department (HOSPITAL_COMMUNITY): Payer: Medicare Other

## 2010-11-17 ENCOUNTER — Encounter (HOSPITAL_COMMUNITY): Payer: Self-pay

## 2010-11-17 ENCOUNTER — Telehealth: Payer: Self-pay | Admitting: Family Medicine

## 2010-11-17 DIAGNOSIS — J189 Pneumonia, unspecified organism: Principal | ICD-10-CM | POA: Diagnosis present

## 2010-11-17 DIAGNOSIS — I10 Essential (primary) hypertension: Secondary | ICD-10-CM

## 2010-11-17 DIAGNOSIS — Z954 Presence of other heart-valve replacement: Secondary | ICD-10-CM

## 2010-11-17 DIAGNOSIS — Z87898 Personal history of other specified conditions: Secondary | ICD-10-CM

## 2010-11-17 DIAGNOSIS — C9110 Chronic lymphocytic leukemia of B-cell type not having achieved remission: Secondary | ICD-10-CM | POA: Diagnosis present

## 2010-11-17 DIAGNOSIS — Z951 Presence of aortocoronary bypass graft: Secondary | ICD-10-CM

## 2010-11-17 LAB — URINE MICROSCOPIC-ADD ON

## 2010-11-17 LAB — CBC
HCT: 33.4 % — ABNORMAL LOW (ref 39.0–52.0)
Hemoglobin: 11.4 g/dL — ABNORMAL LOW (ref 13.0–17.0)
RBC: 3.33 MIL/uL — ABNORMAL LOW (ref 4.22–5.81)

## 2010-11-17 LAB — URINALYSIS, ROUTINE W REFLEX MICROSCOPIC
Glucose, UA: NEGATIVE mg/dL
Specific Gravity, Urine: 1.015 (ref 1.005–1.030)
pH: 6 (ref 5.0–8.0)

## 2010-11-17 LAB — BASIC METABOLIC PANEL
CO2: 29 mEq/L (ref 19–32)
Glucose, Bld: 117 mg/dL — ABNORMAL HIGH (ref 70–99)
Potassium: 4 mEq/L (ref 3.5–5.1)
Sodium: 142 mEq/L (ref 135–145)

## 2010-11-17 LAB — RAPID STREP SCREEN (MED CTR MEBANE ONLY): Streptococcus, Group A Screen (Direct): NEGATIVE

## 2010-11-17 MED ORDER — ACYCLOVIR 200 MG PO CAPS
200.0000 mg | ORAL_CAPSULE | Freq: Two times a day (BID) | ORAL | Status: DC
  Administered 2010-11-18 (×2): 200 mg via ORAL
  Filled 2010-11-17 (×6): qty 1

## 2010-11-17 MED ORDER — GUAIFENESIN-DM 100-10 MG/5ML PO SYRP
5.0000 mL | ORAL_SOLUTION | ORAL | Status: DC | PRN
  Administered 2010-11-18: 5 mL via ORAL
  Filled 2010-11-17: qty 5

## 2010-11-17 MED ORDER — PANTOPRAZOLE SODIUM 40 MG PO TBEC
40.0000 mg | DELAYED_RELEASE_TABLET | Freq: Every day | ORAL | Status: DC
  Administered 2010-11-18 (×2): 40 mg via ORAL
  Filled 2010-11-17 (×2): qty 1

## 2010-11-17 MED ORDER — MOXIFLOXACIN HCL 400 MG PO TABS
400.0000 mg | ORAL_TABLET | Freq: Every day | ORAL | Status: DC
  Administered 2010-11-18: 400 mg via ORAL
  Filled 2010-11-17: qty 1

## 2010-11-17 MED ORDER — SODIUM CHLORIDE 0.9 % IV SOLN
INTRAVENOUS | Status: DC
  Administered 2010-11-18 (×2): via INTRAVENOUS

## 2010-11-17 MED ORDER — MOXIFLOXACIN HCL IN NACL 400 MG/250ML IV SOLN
400.0000 mg | Freq: Once | INTRAVENOUS | Status: AC
  Administered 2010-11-17: 400 mg via INTRAVENOUS
  Filled 2010-11-17: qty 250

## 2010-11-17 MED ORDER — SODIUM CHLORIDE 0.9 % IV SOLN
Freq: Once | INTRAVENOUS | Status: AC
  Administered 2010-11-17: 19:00:00 via INTRAVENOUS

## 2010-11-17 MED ORDER — METOPROLOL TARTRATE 50 MG PO TABS
50.0000 mg | ORAL_TABLET | Freq: Two times a day (BID) | ORAL | Status: DC
  Administered 2010-11-18 (×3): 50 mg via ORAL
  Filled 2010-11-17 (×3): qty 1

## 2010-11-17 NOTE — ED Notes (Signed)
Pt has history of CLL. States that he went to his MD last week and was told that it was back. Pt also states that his wife has the same symptoms and was seen yesterday. Pt c/o aching all over, cough, low grade fever.

## 2010-11-17 NOTE — Progress Notes (Signed)
  Job [id number]

## 2010-11-17 NOTE — Progress Notes (Signed)
Addended by: Edythe Lynn A on: 11/17/2010 02:35 PM   Modules accepted: Orders

## 2010-11-17 NOTE — ED Notes (Signed)
Pt reporting generalized fatigue.  Denies pain or nausea at this time. Reports sore throat continues.  Mild, non productive cough noted.  Temp rechecked.  IV started and fluid infusing.  No distress noted.

## 2010-11-17 NOTE — Telephone Encounter (Signed)
noted 

## 2010-11-17 NOTE — ED Notes (Signed)
Pt woke this a.m with a sore throat and cough.  Pt reports feeling "achy all over".  Pt has low grade temperature in triage.

## 2010-11-17 NOTE — ED Provider Notes (Signed)
History     CSN: [id number] Arrival date & time: 11/17/2010  5:50 PM  Chief Complaint  Patient presents with  . Sore Throat  . Cough   Patient is a 65 y.o. male presenting with pharyngitis and cough. The history is provided by the patient.  Sore Throat This is a new problem. The current episode started today. The problem has been gradually worsening. Associated symptoms include arthralgias, chills, coughing, fatigue, a fever, headaches, myalgias and a sore throat. Pertinent negatives include no abdominal pain, anorexia, change in bowel habit, diaphoresis, nausea, rash, urinary symptoms or visual change. The symptoms are aggravated by nothing. He has tried acetaminophen for the symptoms. The treatment provided mild relief.  Cough Associated symptoms include chills, headaches, sore throat and myalgias.    Past Medical History  Diagnosis Date  . CLL (chronic lymphoblastic leukemia)   . Hodgkin's disease   . Aortic stenosis   . Atrial fibrillation   . Coronary atherosclerosis of native coronary artery   . Hyperlipidemia   . Essential hypertension, benign   . BPH (benign prostatic hypertrophy)   . Elevated WBC count   . Arthritis   . Acid reflux     Past Surgical History  Procedure Date  . Eye surgery   . 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 Aortic valve replacement 10/09    23mm Magna Pericardial   . Esophagogastroduodenoscopy     scrapping of throat and stretching  . Lithotripsy   . Port-a-cath removal   . Bone marrow aspiration     Family History  Problem Relation Age of Onset  . Heart failure Mother   . Blindness Mother   . Cancer Father     Lung   . Heart attack Brother 42    History  Substance Use Topics  . Smoking status: Never Smoker   . Smokeless tobacco: Never Used   Comment: pt denies tobacco use   . Alcohol Use: No     pt denies alcohol       Review of Systems  Constitutional: Positive for fever, chills and fatigue. Negative for  diaphoresis.  HENT: Positive for sore throat.   Respiratory: Positive for cough.   Gastrointestinal: Negative for nausea, abdominal pain, anorexia and change in bowel habit.  Musculoskeletal: Positive for myalgias and arthralgias.  Skin: Negative for rash.  Neurological: Positive for headaches.    Physical Exam  BP 123/67  Pulse 94  Temp(Src) 99.3 F (37.4 C) (Oral)  Resp 20  Ht 5\' 10"  (1.778 m)  Wt 247 lb (112.038 kg)  BMI 35.44 kg/m2  SpO2 95%  Physical Exam  Nursing note and vitals reviewed. Constitutional: He is oriented to person, place, and time. He appears well-developed and well-nourished.  Non-toxic appearance.  HENT:  Head: Normocephalic.  Right Ear: Tympanic membrane and external ear normal.  Left Ear: Tympanic membrane and external ear normal.  Eyes: EOM and lids are normal. Pupils are equal, round, and reactive to light.  Neck: Normal range of motion. Neck supple. Carotid bruit is not present.  Cardiovascular: Normal rate, regular rhythm, intact distal pulses and normal pulses.   Murmur heard. Pulmonary/Chest: No stridor. No respiratory distress.       Few scattered rhonchi an wheezes.  Abdominal: Soft. Bowel sounds are normal. There is no tenderness. There is no guarding.  Musculoskeletal: Normal range of motion.  Lymphadenopathy:       Head (right side): No submandibular adenopathy present.  Head (left side): No submandibular adenopathy present.    He has no cervical adenopathy.  Neurological: He is alert and oriented to person, place, and time. He has normal strength. No cranial nerve deficit or sensory deficit.  Skin: Skin is warm and dry.  Psychiatric: He has a normal mood and affect. His speech is normal.    ED Course  Procedures  MDM I have reviewed nursing notes, vital signs, and all appropriate lab and imaging results for this patient.   Results for orders placed during the hospital encounter of 11/17/10  RAPID STREP SCREEN       Component Value Range   Streptococcus, Group A Screen (Direct) NEGATIVE  NEGATIVE   CBC      Component Value Range   WBC 17.8 (*) 4.0 - 10.5 (K/uL)   RBC 3.33 (*) 4.22 - 5.81 (MIL/uL)   Hemoglobin 11.4 (*) 13.0 - 17.0 (g/dL)   HCT 16.1 (*) 09.6 - 52.0 (%)   MCV 100.3 (*) 78.0 - 100.0 (fL)   MCH 34.2 (*) 26.0 - 34.0 (pg)   MCHC 34.1  30.0 - 36.0 (g/dL)   RDW 04.5  40.9 - 81.1 (%)   Platelets 70 (*) 150 - 400 (K/uL)  BASIC METABOLIC PANEL      Component Value Range   Sodium 142  135 - 145 (mEq/L)   Potassium 4.0  3.5 - 5.1 (mEq/L)   Chloride 105  96 - 112 (mEq/L)   CO2 29  19 - 32 (mEq/L)   Glucose, Bld 117 (*) 70 - 99 (mg/dL)   BUN 11  6 - 23 (mg/dL)   Creatinine, Ser 9.14  0.50 - 1.35 (mg/dL)   Calcium 8.9  8.4 - 78.2 (mg/dL)   GFR calc non Af Amer >60  >60 (mL/min)   GFR calc Af Amer >60  >60 (mL/min)  URINALYSIS, ROUTINE W REFLEX MICROSCOPIC      Component Value Range   Color, Urine YELLOW  YELLOW    Appearance CLEAR  CLEAR    Specific Gravity, Urine 1.015  1.005 - 1.030    pH 6.0  5.0 - 8.0    Glucose, UA NEGATIVE  NEGATIVE (mg/dL)   Hgb urine dipstick TRACE (*) NEGATIVE    Bilirubin Urine NEGATIVE  NEGATIVE    Ketones, ur NEGATIVE  NEGATIVE (mg/dL)   Protein, ur NEGATIVE  NEGATIVE (mg/dL)   Urobilinogen, UA 0.2  0.0 - 1.0 (mg/dL)   Nitrite NEGATIVE  NEGATIVE    Leukocytes, UA LARGE (*) NEGATIVE   CULTURE, BLOOD (ROUTINE X 2)      Component Value Range   Specimen Description LEFT ANTECUBITAL     Special Requests BOTTLES DRAWN AEROBIC AND ANAEROBIC 5CC     Culture NO GROWTH 5 DAYS     Report Status 11/22/2010 FINAL    CULTURE, BLOOD (ROUTINE X 2)      Component Value Range   Specimen Description RIGHT ANTECUBITAL DRAWN BY RN     Special Requests BAA 6CC     Culture NO GROWTH 5 DAYS     Report Status 11/22/2010 FINAL    URINE MICROSCOPIC-ADD ON      Component Value Range   Squamous Epithelial / LPF FEW (*) RARE    WBC, UA 21-50  <3 (WBC/hpf)   RBC / HPF 0-2  <3  (RBC/hpf)   Bacteria, UA FEW (*) RARE   BASIC METABOLIC PANEL      Component Value Range   Sodium 139  135 -  145 (mEq/L)   Potassium 3.9  3.5 - 5.1 (mEq/L)   Chloride 103  96 - 112 (mEq/L)   CO2 25  19 - 32 (mEq/L)   Glucose, Bld 123 (*) 70 - 99 (mg/dL)   BUN 12  6 - 23 (mg/dL)   Creatinine, Ser 4.09  0.50 - 1.35 (mg/dL)   Calcium 8.8  8.4 - 81.1 (mg/dL)   GFR calc non Af Amer >60  >60 (mL/min)   GFR calc Af Amer >60  >60 (mL/min)  CBC      Component Value Range   WBC 21.9 (*) 4.0 - 10.5 (K/uL)   RBC 3.33 (*) 4.22 - 5.81 (MIL/uL)   Hemoglobin 11.5 (*) 13.0 - 17.0 (g/dL)   HCT 91.4 (*) 78.2 - 52.0 (%)   MCV 100.6 (*) 78.0 - 100.0 (fL)   MCH 34.5 (*) 26.0 - 34.0 (pg)   MCHC 34.3  30.0 - 36.0 (g/dL)   RDW 95.6  21.3 - 08.6 (%)   Platelets 74 (*) 150 - 400 (K/uL)  INFLUENZA PANEL BY PCR      Component Value Range   Influenza A By PCR NEGATIVE  NEGATIVE    Influenza B By PCR NEGATIVE  NEGATIVE    H1N1 flu by pcr NOT DETECTED  NOT DETECTED   CBC      Component Value Range   WBC 20.1 (*) 4.0 - 10.5 (K/uL)   RBC 3.36 (*) 4.22 - 5.81 (MIL/uL)   Hemoglobin 11.6 (*) 13.0 - 17.0 (g/dL)   HCT 57.8 (*) 46.9 - 52.0 (%)   MCV 100.0  78.0 - 100.0 (fL)   MCH 34.5 (*) 26.0 - 34.0 (pg)   MCHC 34.5  30.0 - 36.0 (g/dL)   RDW 62.9  52.8 - 41.3 (%)   Platelets 78 (*) 150 - 400 (K/uL)  COMPREHENSIVE METABOLIC PANEL      Component Value Range   Sodium 140  135 - 145 (mEq/L)   Potassium 3.9  3.5 - 5.1 (mEq/L)   Chloride 106  96 - 112 (mEq/L)   CO2 26  19 - 32 (mEq/L)   Glucose, Bld 138 (*) 70 - 99 (mg/dL)   BUN 13  6 - 23 (mg/dL)   Creatinine, Ser 2.44  0.50 - 1.35 (mg/dL)   Calcium 9.1  8.4 - 01.0 (mg/dL)   Total Protein 5.8 (*) 6.0 - 8.3 (g/dL)   Albumin 3.3 (*) 3.5 - 5.2 (g/dL)   AST 13  0 - 37 (U/L)   ALT 11  0 - 53 (U/L)   Alkaline Phosphatase 79  39 - 117 (U/L)   Total Bilirubin 0.6  0.3 - 1.2 (mg/dL)   GFR calc non Af Amer >60  >60 (mL/min)   GFR calc Af Amer >60  >60 (mL/min)       Kathie Dike, PA 11/27/10 1005

## 2010-11-18 DIAGNOSIS — J189 Pneumonia, unspecified organism: Secondary | ICD-10-CM

## 2010-11-18 LAB — BASIC METABOLIC PANEL
CO2: 25 mEq/L (ref 19–32)
Calcium: 8.8 mg/dL (ref 8.4–10.5)
GFR calc non Af Amer: >60 mL/min (ref >60)
Potassium: 3.9 mEq/L (ref 3.5–5.1)
Sodium: 139 mEq/L (ref 135–145)

## 2010-11-18 LAB — CBC
Hemoglobin: 11.5 g/dL — ABNORMAL LOW (ref 13.0–17.0)
Platelets: 74 10*3/uL — ABNORMAL LOW (ref 150–400)
RBC: 3.33 MIL/uL — ABNORMAL LOW (ref 4.22–5.81)
WBC: 21.9 10*3/uL — ABNORMAL HIGH (ref 4.0–10.5)

## 2010-11-18 LAB — INFLUENZA PANEL BY PCR (TYPE A & B)
H1N1 flu by pcr: NOT DETECTED
Influenza A By PCR: NEGATIVE

## 2010-11-18 MED ORDER — METHYLPREDNISOLONE SODIUM SUCC 125 MG IJ SOLR
125.0000 mg | Freq: Once | INTRAMUSCULAR | Status: AC
  Administered 2010-11-18: 125 mg via INTRAVENOUS
  Filled 2010-11-18: qty 2

## 2010-11-18 MED ORDER — PREDNISONE 20 MG PO TABS
20.0000 mg | ORAL_TABLET | Freq: Every day | ORAL | Status: DC
  Administered 2010-11-19: 20 mg via ORAL
  Filled 2010-11-18: qty 1

## 2010-11-18 NOTE — Progress Notes (Signed)
UR Chart Review Completed  

## 2010-11-18 NOTE — H&P (Signed)
NAMEWALTON, DIGILIO              ACCOUNT NO.:  [id number]  MEDICAL RECORD NO.:  [id number]  LOCATION:  A308                          FACILITY:  [facility]  PHYSICIAN:  Tarry Kos, MD       DATE OF BIRTH:  February 18, 1946  DATE OF ADMISSION:  11/17/2010 DATE OF DISCHARGE:  LH                             HISTORY & PHYSICAL   CHIEF COMPLAINT:  Cough, fever.  HISTORY OF PRESENT ILLNESS:  Mr. Mah is a 65 year old male who has a history of CLL and Hodgkin lymphoma in the past along with status post CABG and valve replacement who comes to the ED after 1 day of running fever.  He has been having cough for several days and feeling general malaise.  He has actually been in remission for his Hodgkin and his CLL for several years and saw Dr. [name] last week because they think his CLL has come back.  He is not currently undergoing any treatment for that yet and they are trying to figure out a treatment plan for him as of yet.  He is found to have infiltrate on chest x-ray, and we are being asked to admit the patient for pneumonia.  He has not had any outpatient treatment for this.  He says he feels better since he has received IV fluids in the ED.  He denies any chest pain, abdominal pain, shortness of breath, nausea, vomiting, diarrhea, or dysuria.  PAST MEDICAL HISTORY: 1. CLL which had been in remission for years which sounds like it has     returned. 2. History also of Hodgkin. 3. Status post CABG with aortic valve replacement in the past with a     pig valve.  ALLERGIES:  None.  MEDICATIONS: 1. Acyclovir 200 mg two times a day. 2. Aspirin 81 mg daily. 3. Folic acid 1 mg daily. 4. Metoprolol 50 mg 2 tablets daily. 5. Multivitamin daily. 6. Omeprazole 20 mg daily. 7. Crestor 40 mg daily.  SOCIAL HISTORY:  He is a nonsmoker.  No alcohol.  No IV drug abuse.  PHYSICAL EXAMINATION:  VITAL SIGNS:  Temperature is 98, blood pressure 132/76, pulse 92, respirations 19, 96% O2 sats on  room air. GENERAL:  He is alert and oriented x4.  No apparent distress, cooperative and friendly. HEENT:  Extraocular muscles are intact.  Pupils are equal and reactive to light.  Oropharynx clear.  Mucous membranes moist. NECK:  No JVD.  No carotid bruits. COR:  Regular rate and rhythm without murmurs, rubs, or gallops. CHEST:  Clear to auscultation bilaterally.  No wheeze, rhonchi, or rales. ABDOMEN:  Soft, nontender, and nondistended.  Positive bowel sounds.  No hepatosplenomegaly. EXTREMITIES:  No clubbing, cyanosis, or edema. PSYCHIATRIC:  Normal affect. SKIN:  No rashes. NEUROLOGIC:  No focal neurologic deficits.  LABORATORY DATA:  BMP is totally normal.  White count is 17.8, hemoglobin is 11.4.  Urinalysis is essentially negative.  Strep screen is negative.  Chest x-ray questionable left lower lung infiltrate.  ASSESSMENT/PLAN:  This is a 65 year old male with community-acquired pneumonia. 1. Community-acquired pneumonia.  We will observe him overnight, place     him on Avelox, and some IV fluids. 2. CLL,  this sounds like recurrent.  He will need close outpatient     followup with Dr. [name] who he is already following. 3. Status post CABG and aortic valve replacement.  He is currently not     on Coumadin treatment, continue aspirin. 4. Hypertension.  Continue his beta blocker. 5. The patient is a full code.  Further recommendation pending on     overall hospital course.                                           ______________________________ Tarry Kos, MD     RD/MEDQ  D:  11/17/2010  T:  11/18/2010  Job:  [id number]

## 2010-11-18 NOTE — Progress Notes (Signed)
11/18/10 1219 Influenza PCR swab obtained as ordered and sent to lab. Patient already on droplet precautions since admission due to fever and cough.

## 2010-11-18 NOTE — Progress Notes (Addendum)
Subjective: This man was admitted with fever, nonproductive cough and aching all over. Interestingly, his wife also has similar symptoms. He was seen by Oncology recently and was told that he had recurrence of his CLL. He also previously has a history of lymphoma. He was admitted and started on intravenous antibiotics and fluids.           Physical Exam: Blood pressure 128/75, pulse 87, temperature 99.4 F (37.4 C), temperature source Oral, resp. rate 19, height 5\' 10"  (1.778 m), weight 114.125 kg (251 lb 9.6 oz), SpO2 96.00%. He does not look toxic or septic. There is not acutely increased work of breathing. Examination of his lungs shows bilateral wheezing. There is no bronchial breathing or crackles. Abdomen is soft and nontender. There is splenomegaly. I cannot find any lymphadenopathy in his neck or supraclavicular regions. He is alert and orientated. Heart sounds are present and normal without murmurs.   Investigations: Results for orders placed during the hospital encounter of 11/17/10 (from the past 48 hour(s))  CBC     Status: Abnormal   Collection Time   11/17/10  6:54 PM      Component Value Range Comment   WBC 17.8 (*) 4.0 - 10.5 (K/uL)    RBC 3.33 (*) 4.22 - 5.81 (MIL/uL)    Hemoglobin 11.4 (*) 13.0 - 17.0 (g/dL)    HCT 04.5 (*) 40.9 - 52.0 (%)    MCV 100.3 (*) 78.0 - 100.0 (fL)    MCH 34.2 (*) 26.0 - 34.0 (pg)    MCHC 34.1  30.0 - 36.0 (g/dL)    RDW 81.1  91.4 - 78.2 (%)    Platelets 70 (*) 150 - 400 (K/uL)   BASIC METABOLIC PANEL     Status: Abnormal   Collection Time   11/17/10  6:54 PM      Component Value Range Comment   Sodium 142  135 - 145 (mEq/L)    Potassium 4.0  3.5 - 5.1 (mEq/L)    Chloride 105  96 - 112 (mEq/L)    CO2 29  19 - 32 (mEq/L)    Glucose, Bld 117 (*) 70 - 99 (mg/dL)    BUN 11  6 - 23 (mg/dL)    Creatinine, Ser 9.56  0.50 - 1.35 (mg/dL)    Calcium 8.9  8.4 - 10.5 (mg/dL)    GFR calc non Af Amer >60  >60 (mL/min)    GFR calc Af Amer >60   >60 (mL/min)   CULTURE, BLOOD (ROUTINE X 2)     Status: Normal (Preliminary result)   Collection Time   11/17/10  6:56 PM      Component Value Range Comment   Specimen Description BLOOD LEFT AC      Special Requests BOTTLES DRAWN AEROBIC AND ANAEROBIC 5CC      Culture NO GROWTH 1 DAY      Report Status PENDING     CULTURE, BLOOD (ROUTINE X 2)     Status: Normal (Preliminary result)   Collection Time   11/17/10  7:00 PM      Component Value Range Comment   Specimen Description BLOOD RIGHT AC      Special Requests BAA 6CC      Culture NO GROWTH 1 DAY      Report Status PENDING     RAPID STREP SCREEN     Status: Normal   Collection Time   11/17/10  7:24 PM      Component Value Range Comment  Streptococcus, Group A Screen (Direct) NEGATIVE  NEGATIVE    URINALYSIS, ROUTINE W REFLEX MICROSCOPIC     Status: Abnormal   Collection Time   11/17/10  7:47 PM      Component Value Range Comment   Color, Urine YELLOW  YELLOW     Appearance CLEAR  CLEAR     Specific Gravity, Urine 1.015  1.005 - 1.030     pH 6.0  5.0 - 8.0     Glucose, UA NEGATIVE  NEGATIVE (mg/dL)    Hgb urine dipstick TRACE (*) NEGATIVE     Bilirubin Urine NEGATIVE  NEGATIVE     Ketones, ur NEGATIVE  NEGATIVE (mg/dL)    Protein, ur NEGATIVE  NEGATIVE (mg/dL)    Urobilinogen, UA 0.2  0.0 - 1.0 (mg/dL)    Nitrite NEGATIVE  NEGATIVE     Leukocytes, UA LARGE (*) NEGATIVE    URINE MICROSCOPIC-ADD ON     Status: Abnormal   Collection Time   11/17/10  7:47 PM      Component Value Range Comment   Squamous Epithelial / LPF FEW (*) RARE     WBC, UA 21-50  <3 (WBC/hpf)    RBC / HPF 0-2  <3 (RBC/hpf)    Bacteria, UA FEW (*) RARE    BASIC METABOLIC PANEL     Status: Abnormal   Collection Time   11/18/10  4:53 AM      Component Value Range Comment   Sodium 139  135 - 145 (mEq/L)    Potassium 3.9  3.5 - 5.1 (mEq/L)    Chloride 103  96 - 112 (mEq/L)    CO2 25  19 - 32 (mEq/L)    Glucose, Bld 123 (*) 70 - 99 (mg/dL)    BUN 12  6 -  23 (mg/dL)    Creatinine, Ser 1.91  0.50 - 1.35 (mg/dL)    Calcium 8.8  8.4 - 10.5 (mg/dL)    GFR calc non Af Amer >60  >60 (mL/min)    GFR calc Af Amer >60  >60 (mL/min)   CBC     Status: Abnormal   Collection Time   11/18/10  4:53 AM      Component Value Range Comment   WBC 21.9 (*) 4.0 - 10.5 (K/uL)    RBC 3.33 (*) 4.22 - 5.81 (MIL/uL)    Hemoglobin 11.5 (*) 13.0 - 17.0 (g/dL)    HCT 47.8 (*) 29.5 - 52.0 (%)    MCV 100.6 (*) 78.0 - 100.0 (fL)    MCH 34.5 (*) 26.0 - 34.0 (pg)    MCHC 34.3  30.0 - 36.0 (g/dL)    RDW 62.1  30.8 - 65.7 (%)    Platelets 74 (*) 150 - 400 (K/uL)    Recent Results (from the past 240 hour(s))  CULTURE, BLOOD (ROUTINE X 2)     Status: Normal (Preliminary result)   Collection Time   11/17/10  6:56 PM      Component Value Range Status Comment   Specimen Description BLOOD LEFT AC   Final    Special Requests BOTTLES DRAWN AEROBIC AND ANAEROBIC 5CC   Final    Culture NO GROWTH 1 DAY   Final    Report Status PENDING   Incomplete   CULTURE, BLOOD (ROUTINE X 2)     Status: Normal (Preliminary result)   Collection Time   11/17/10  7:00 PM      Component Value Range Status Comment   Specimen Description  BLOOD RIGHT AC   Final    Special Requests BAA 6CC   Final    Culture NO GROWTH 1 DAY   Final    Report Status PENDING   Incomplete   RAPID STREP SCREEN     Status: Normal   Collection Time   11/17/10  7:24 PM      Component Value Range Status Comment   Streptococcus, Group A Screen (Direct) NEGATIVE  NEGATIVE  Final     Dg Chest 2 View  11/17/2010  *RADIOLOGY REPORT*  Clinical Data: Fever and cough.  CHEST - 2 VIEW  Comparison: 08/10/2010 and CT from 10/02/2010  Findings: Two views of the chest were obtained.  Right subclavian Port-A-Cath is present.  Catheter tip in the SVC.  Patient is status post aortic valve surgery.  There are slightly increased densities at the left lung base which are nonspecific.  Heart size is stable.  IMPRESSION: Subtle densities at  the left lung base. The left lung base findings are probably related to atelectasis or chronic changes.  Early infection cannot be completely excluded.  Original Report Authenticated By: Richarda Overlie, M.D.      Medications: I have reviewed the patient's current medications.  Impression: 1. Probable left pneumonia in an immunocompromised patient. Coexistent bronchitis. 2. CLL recurrence. 3. History of lymphoma.     Plan: 1. Intravenous steroid 1 dose today followed by oral steroids tomorrow. 2. Continue intravenous antibiotics. 3. Repeat chest x-ray tomorrow. Check influenza panel. 4. Possible discharge home tomorrow depending on clinical condition.     LOS: 1 day   GOSRANI,NIMISH C 11/18/2010, 11:52 AM

## 2010-11-19 ENCOUNTER — Observation Stay (HOSPITAL_COMMUNITY): Payer: Medicare Other

## 2010-11-19 LAB — CBC
Platelets: 78 10*3/uL — ABNORMAL LOW (ref 150–400)
RBC: 3.36 MIL/uL — ABNORMAL LOW (ref 4.22–5.81)
RDW: 15.3 % (ref 11.5–15.5)
WBC: 20.1 10*3/uL — ABNORMAL HIGH (ref 4.0–10.5)

## 2010-11-19 LAB — COMPREHENSIVE METABOLIC PANEL
ALT: 11 U/L (ref 0–53)
AST: 13 U/L (ref 0–37)
Albumin: 3.3 g/dL — ABNORMAL LOW (ref 3.5–5.2)
Alkaline Phosphatase: 79 U/L (ref 39–117)
Chloride: 106 mEq/L (ref 96–112)
Potassium: 3.9 mEq/L (ref 3.5–5.1)
Sodium: 140 mEq/L (ref 135–145)
Total Bilirubin: 0.6 mg/dL (ref 0.3–1.2)

## 2010-11-19 MED ORDER — SODIUM CHLORIDE 0.9 % IJ SOLN
INTRAMUSCULAR | Status: AC
  Filled 2010-11-19: qty 10

## 2010-11-19 MED ORDER — PREDNISONE 20 MG PO TABS: ORAL_TABLET | ORAL | Status: DC

## 2010-11-19 MED ORDER — MOXIFLOXACIN HCL 400 MG PO TABS: 400.0000 mg | ORAL_TABLET | Freq: Every day | ORAL | Status: AC

## 2010-11-19 MED ORDER — GUAIFENESIN-DM 100-10 MG/5ML PO SYRP: 5.0000 mL | ORAL_SOLUTION | ORAL | Status: AC | PRN

## 2010-11-19 NOTE — Discharge Summary (Signed)
Physician Discharge Summary  Patient ID: [name] MRN: [id number] DOB/AGE: [birth year] 65 y.o. Primary Care Physician:Margaret Lodema Hong, MD, MD Admit date: 11/17/2010 Discharge date: 11/19/2010    Discharge Diagnoses:  1. Left-sided pneumonia. 2. CLL. 3. History of lymphoma.   Current Discharge Medication List    START taking these medications   Details  guaiFENesin-dextromethorphan (ROBITUSSIN DM) 100-10 MG/5ML syrup Take 5 mLs by mouth every 4 (four) hours as needed for cough. Qty: 118 mL, Refills: 0    moxifloxacin (AVELOX) 400 MG tablet Take 1 tablet (400 mg total) by mouth daily at 6 PM. Qty: 7 tablet, Refills: 0    predniSONE (DELTASONE) 20 MG tablet Take 2 tablets daily for 3 days, then 1 tablet daily for 3 days, then half tablet daily for 3 days, then STOP. Qty: 12 tablet, Refills: 0      CONTINUE these medications which have NOT CHANGED   Details  acetaminophen (TYLENOL) 500 MG tablet Take 1,000 mg by mouth once as needed. For pain     acyclovir (ZOVIRAX) 200 MG capsule Take 200 mg by mouth 2 (two) times daily. Take one tablet by mouth twice a day     aspirin (ASPIRIN LOW DOSE) 81 MG EC tablet Take 81 mg by mouth daily. Take one tablet by mouth daily     folic acid (FOLVITE) 1 MG tablet Take 1 mg by mouth daily. Take one tablet by mouth once a day     metoprolol (LOPRESSOR) 50 MG tablet Take 1 tablet (50 mg total) by mouth 2 (two) times daily. Qty: 60 tablet, Refills: 6   Associated Diagnoses: HTN (hypertension)    Multiple Vitamin (MULTIVITAMIN) tablet Take 1 tablet by mouth daily.      omeprazole (PRILOSEC) 20 MG capsule Take 20 mg by mouth 2 (two) times daily.      rosuvastatin (CRESTOR) 40 MG tablet Take 1 tablet (40 mg total) by mouth at bedtime. Qty: 30 tablet, Refills: 6        Discharged Condition: Stable and improved.    Consults: None.  Significant Diagnostic Studies: Dg Chest 2 View  11/17/2010  *RADIOLOGY REPORT*  Clinical Data:  Fever and cough.  CHEST - 2 VIEW  Comparison: 08/10/2010 and CT from 10/02/2010  Findings: Two views of the chest were obtained.  Right subclavian Port-A-Cath is present.  Catheter tip in the SVC.  Patient is status post aortic valve surgery.  There are slightly increased densities at the left lung base which are nonspecific.  Heart size is stable.  IMPRESSION: Subtle densities at the left lung base. The left lung base findings are probably related to atelectasis or chronic changes.  Early infection cannot be completely excluded.  Original Report Authenticated By: Richarda Overlie, M.D.    Lab Results: Results for orders placed during the hospital encounter of 11/17/10 (from the past 48 hour(s))  CBC     Status: Abnormal   Collection Time   11/17/10  6:54 PM      Component Value Range Comment   WBC 17.8 (*) 4.0 - 10.5 (K/uL)    RBC 3.33 (*) 4.22 - 5.81 (MIL/uL)    Hemoglobin 11.4 (*) 13.0 - 17.0 (g/dL)    HCT 13.2 (*) 44.0 - 52.0 (%)    MCV 100.3 (*) 78.0 - 100.0 (fL)    MCH 34.2 (*) 26.0 - 34.0 (pg)    MCHC 34.1  30.0 - 36.0 (g/dL)    RDW 10.2  72.5 - 36.6 (%)  Platelets 70 (*) 150 - 400 (K/uL)   BASIC METABOLIC PANEL     Status: Abnormal   Collection Time   11/17/10  6:54 PM      Component Value Range Comment   Sodium 142  135 - 145 (mEq/L)    Potassium 4.0  3.5 - 5.1 (mEq/L)    Chloride 105  96 - 112 (mEq/L)    CO2 29  19 - 32 (mEq/L)    Glucose, Bld 117 (*) 70 - 99 (mg/dL)    BUN 11  6 - 23 (mg/dL)    Creatinine, Ser 1.61  0.50 - 1.35 (mg/dL)    Calcium 8.9  8.4 - 10.5 (mg/dL)    GFR calc non Af Amer >60  >60 (mL/min)    GFR calc Af Amer >60  >60 (mL/min)   CULTURE, BLOOD (ROUTINE X 2)     Status: Normal (Preliminary result)   Collection Time   11/17/10  6:56 PM      Component Value Range Comment   Specimen Description BLOOD LEFT AC      Special Requests BOTTLES DRAWN AEROBIC AND ANAEROBIC 5CC      Culture NO GROWTH 1 DAY      Report Status PENDING     CULTURE, BLOOD (ROUTINE X 2)      Status: Normal (Preliminary result)   Collection Time   11/17/10  7:00 PM      Component Value Range Comment   Specimen Description BLOOD RIGHT AC      Special Requests BAA 6CC      Culture NO GROWTH 1 DAY      Report Status PENDING     RAPID STREP SCREEN     Status: Normal   Collection Time   11/17/10  7:24 PM      Component Value Range Comment   Streptococcus, Group A Screen (Direct) NEGATIVE  NEGATIVE    URINALYSIS, ROUTINE W REFLEX MICROSCOPIC     Status: Abnormal   Collection Time   11/17/10  7:47 PM      Component Value Range Comment   Color, Urine YELLOW  YELLOW     Appearance CLEAR  CLEAR     Specific Gravity, Urine 1.015  1.005 - 1.030     pH 6.0  5.0 - 8.0     Glucose, UA NEGATIVE  NEGATIVE (mg/dL)    Hgb urine dipstick TRACE (*) NEGATIVE     Bilirubin Urine NEGATIVE  NEGATIVE     Ketones, ur NEGATIVE  NEGATIVE (mg/dL)    Protein, ur NEGATIVE  NEGATIVE (mg/dL)    Urobilinogen, UA 0.2  0.0 - 1.0 (mg/dL)    Nitrite NEGATIVE  NEGATIVE     Leukocytes, UA LARGE (*) NEGATIVE    URINE MICROSCOPIC-ADD ON     Status: Abnormal   Collection Time   11/17/10  7:47 PM      Component Value Range Comment   Squamous Epithelial / LPF FEW (*) RARE     WBC, UA 21-50  <3 (WBC/hpf)    RBC / HPF 0-2  <3 (RBC/hpf)    Bacteria, UA FEW (*) RARE    BASIC METABOLIC PANEL     Status: Abnormal   Collection Time   11/18/10  4:53 AM      Component Value Range Comment   Sodium 139  135 - 145 (mEq/L)    Potassium 3.9  3.5 - 5.1 (mEq/L)    Chloride 103  96 - 112 (mEq/L)  CO2 25  19 - 32 (mEq/L)    Glucose, Bld 123 (*) 70 - 99 (mg/dL)    BUN 12  6 - 23 (mg/dL)    Creatinine, Ser 1.61  0.50 - 1.35 (mg/dL)    Calcium 8.8  8.4 - 10.5 (mg/dL)    GFR calc non Af Amer >60  >60 (mL/min)    GFR calc Af Amer >60  >60 (mL/min)   CBC     Status: Abnormal   Collection Time   11/18/10  4:53 AM      Component Value Range Comment   WBC 21.9 (*) 4.0 - 10.5 (K/uL)    RBC 3.33 (*) 4.22 - 5.81 (MIL/uL)      Hemoglobin 11.5 (*) 13.0 - 17.0 (g/dL)    HCT 09.6 (*) 04.5 - 52.0 (%)    MCV 100.6 (*) 78.0 - 100.0 (fL)    MCH 34.5 (*) 26.0 - 34.0 (pg)    MCHC 34.3  30.0 - 36.0 (g/dL)    RDW 40.9  81.1 - 91.4 (%)    Platelets 74 (*) 150 - 400 (K/uL)   INFLUENZA PANEL BY PCR     Status: Normal   Collection Time   11/18/10 12:00 PM      Component Value Range Comment   Influenza A By PCR NEGATIVE  NEGATIVE     Influenza B By PCR NEGATIVE  NEGATIVE     H1N1 flu by pcr NOT DETECTED  NOT DETECTED    CBC     Status: Abnormal   Collection Time   11/19/10  5:26 AM      Component Value Range Comment   WBC 20.1 (*) 4.0 - 10.5 (K/uL)    RBC 3.36 (*) 4.22 - 5.81 (MIL/uL)    Hemoglobin 11.6 (*) 13.0 - 17.0 (g/dL)    HCT 78.2 (*) 95.6 - 52.0 (%)    MCV 100.0  78.0 - 100.0 (fL)    MCH 34.5 (*) 26.0 - 34.0 (pg)    MCHC 34.5  30.0 - 36.0 (g/dL)    RDW 21.3  08.6 - 57.8 (%)    Platelets 78 (*) 150 - 400 (K/uL)   COMPREHENSIVE METABOLIC PANEL     Status: Abnormal   Collection Time   11/19/10  5:26 AM      Component Value Range Comment   Sodium 140  135 - 145 (mEq/L)    Potassium 3.9  3.5 - 5.1 (mEq/L)    Chloride 106  96 - 112 (mEq/L)    CO2 26  19 - 32 (mEq/L)    Glucose, Bld 138 (*) 70 - 99 (mg/dL)    BUN 13  6 - 23 (mg/dL)    Creatinine, Ser 4.69  0.50 - 1.35 (mg/dL)    Calcium 9.1  8.4 - 10.5 (mg/dL)    Total Protein 5.8 (*) 6.0 - 8.3 (g/dL)    Albumin 3.3 (*) 3.5 - 5.2 (g/dL)    AST 13  0 - 37 (U/L)    ALT 11  0 - 53 (U/L)    Alkaline Phosphatase 79  39 - 117 (U/L)    Total Bilirubin 0.6  0.3 - 1.2 (mg/dL)    GFR calc non Af Amer >60  >60 (mL/min)    GFR calc Af Amer >60  >60 (mL/min)    Recent Results (from the past 240 hour(s))  CULTURE, BLOOD (ROUTINE X 2)     Status: Normal (Preliminary result)   Collection Time   11/17/10  6:56 PM      Component Value Range Status Comment   Specimen Description BLOOD LEFT AC   Final    Special Requests BOTTLES DRAWN AEROBIC AND ANAEROBIC 5CC   Final     Culture NO GROWTH 1 DAY   Final    Report Status PENDING   Incomplete   CULTURE, BLOOD (ROUTINE X 2)     Status: Normal (Preliminary result)   Collection Time   11/17/10  7:00 PM      Component Value Range Status Comment   Specimen Description BLOOD RIGHT AC   Final    Special Requests BAA 6CC   Final    Culture NO GROWTH 1 DAY   Final    Report Status PENDING   Incomplete   RAPID STREP SCREEN     Status: Normal   Collection Time   11/17/10  7:24 PM      Component Value Range Status Comment   Streptococcus, Group A Screen (Direct) NEGATIVE  NEGATIVE  Final      Hospital Course: This 65 year old man was admitted with cough and fever. Please see initial history and physical examination done by Dr. Onalee Hua. This man has recently been diagnosed with recurrence of CLL on the background of previous Hodgkin's lymphoma. He is due to follow with Dr. [name] in the next couple of weeks for further blood work. During hospitalization he was started on intravenous antibiotics and his fevers defervesced. Because he was an immunocompromised patient influenza panel was also done and this was negative. He feels better today although he still wheezing. Undoubtedly, he also has bronchitis in addition to his pneumonia.  Discharge Exam: Blood pressure 156/72, pulse 74, temperature 98.1 F (36.7 C), temperature source Oral, resp. rate 20, height 5\' 10"  (1.778 m), weight 114.125 kg (251 lb 9.6 oz), SpO2 98.00%. He looks systemically well. There is no increased work of breathing. He is saturating 98% on room air. Lung fields show bilateral wheezing but not tight. There is no bronchial breathing and there are no crackles. He is alert and orientated without any focal neurological signs. Heart sounds are present and normal without murmurs. He is clinically not in heart failure.  Disposition: Home. In addition to antibiotics she will have a tapering course of oral steroids.  Discharge Orders    Future Appointments:  Provider: Department: Dept Phone: Center:   11/23/2010 2:30 PM [facility] [phone] None   12/02/2010 9:50 AM [facility] [phone] None   12/21/2010 10:00 AM [name], MD [facility] [phone] [facility]   12/23/2010 9:50 AM [facility] [phone] None   01/13/2011 9:50 AM [facility] [phone] None   01/18/2011 2:30 PM Randall An, MD [facility] [phone] None     Future Orders Please Complete By Expires   Diet - low sodium heart healthy      Increase activity slowly      Discharge instructions      Comments:   Please make sure you inform Dr.Neijstrom office that you have been on steroids when you go for your blood work.        SignedWilson Singer 11/19/2010, 8:12 AM

## 2010-11-22 LAB — CULTURE, BLOOD (ROUTINE X 2): Culture: NO GROWTH

## 2010-11-23 ENCOUNTER — Other Ambulatory Visit (HOSPITAL_COMMUNITY): Payer: Medicare Other

## 2010-11-25 ENCOUNTER — Encounter: Payer: Self-pay | Admitting: Family Medicine

## 2010-11-26 ENCOUNTER — Encounter: Payer: Self-pay | Admitting: Family Medicine

## 2010-11-26 ENCOUNTER — Ambulatory Visit (INDEPENDENT_AMBULATORY_CARE_PROVIDER_SITE_OTHER): Payer: Medicare Other | Admitting: Family Medicine

## 2010-11-26 ENCOUNTER — Encounter (HOSPITAL_BASED_OUTPATIENT_CLINIC_OR_DEPARTMENT_OTHER): Payer: Medicare Other

## 2010-11-26 VITALS — BP 112/62 | HR 77 | Resp 16 | Ht 70.0 in | Wt 248.0 lb

## 2010-11-26 DIAGNOSIS — Z452 Encounter for adjustment and management of vascular access device: Secondary | ICD-10-CM

## 2010-11-26 DIAGNOSIS — I10 Essential (primary) hypertension: Secondary | ICD-10-CM

## 2010-11-26 DIAGNOSIS — K219 Gastro-esophageal reflux disease without esophagitis: Secondary | ICD-10-CM

## 2010-11-26 DIAGNOSIS — C9110 Chronic lymphocytic leukemia of B-cell type not having achieved remission: Secondary | ICD-10-CM

## 2010-11-26 DIAGNOSIS — E785 Hyperlipidemia, unspecified: Secondary | ICD-10-CM

## 2010-11-26 DIAGNOSIS — J189 Pneumonia, unspecified organism: Secondary | ICD-10-CM

## 2010-11-26 LAB — CBC
HCT: 37.9 % — ABNORMAL LOW (ref 39.0–52.0)
MCH: 34.2 pg — ABNORMAL HIGH (ref 26.0–34.0)
MCV: 102.2 fL — ABNORMAL HIGH (ref 78.0–100.0)
RDW: 15.7 % — ABNORMAL HIGH (ref 11.5–15.5)
WBC: 33.9 10*3/uL — ABNORMAL HIGH (ref 4.0–10.5)

## 2010-11-26 LAB — DIFFERENTIAL
Basophils Absolute: 0 10*3/uL (ref 0.0–0.1)
Lymphs Abs: 27.1 10*3/uL — ABNORMAL HIGH (ref 0.7–4.0)
Monocytes Absolute: 0.7 10*3/uL (ref 0.1–1.0)
Monocytes Relative: 2 % — ABNORMAL LOW (ref 3–12)
Neutro Abs: 5.4 10*3/uL (ref 1.7–7.7)

## 2010-11-26 MED ORDER — ROSUVASTATIN CALCIUM 20 MG PO TABS: 20.0000 mg | ORAL_TABLET | Freq: Every day | ORAL | Status: DC

## 2010-11-26 MED ORDER — SODIUM CHLORIDE 0.9 % IJ SOLN
INTRAMUSCULAR | Status: AC
  Administered 2010-11-26: 10 mL via INTRAVENOUS
  Filled 2010-11-26: qty 10

## 2010-11-26 MED ORDER — HEPARIN SOD (PORK) LOCK FLUSH 100 UNIT/ML IV SOLN
500.0000 [IU] | Freq: Once | INTRAVENOUS | Status: AC
  Administered 2010-11-26: 500 [IU] via INTRAVENOUS
  Filled 2010-11-26: qty 5

## 2010-11-26 MED ORDER — HEPARIN SOD (PORK) LOCK FLUSH 100 UNIT/ML IV SOLN
INTRAVENOUS | Status: AC
  Administered 2010-11-26: 500 [IU] via INTRAVENOUS
  Filled 2010-11-26: qty 5

## 2010-11-26 MED ORDER — SODIUM CHLORIDE 0.9 % IJ SOLN
10.0000 mL | Freq: Once | INTRAMUSCULAR | Status: AC
  Administered 2010-11-26: 10 mL via INTRAVENOUS
  Filled 2010-11-26: qty 10

## 2010-11-26 NOTE — Progress Notes (Signed)
[name] presented for Portacath access and flush. Proper placement of portacath confirmed by CXR. Portacath located rt  chest wall accessed with  H 20 needle. Good blood return present. Portacath flushed with 20ml NS and 500U/5ml Heparin and needle removed intact. Procedure without incident. Patient tolerated procedure well.   

## 2010-11-26 NOTE — Patient Instructions (Addendum)
F/u in 3 .5 months.  We will request crestor 20mg  samples for you, and will also provide the requested coupon. Pls get fasting labs before your f/u  All the best with your treatment for recurrent disease, keep your faith, I know you will.  I hope that things move forward with your disabil;ity  LABWORK  NEEDS TO BE DONE BETWEEN 3 TO 7 DAYS BEFORE YOUR NEXT SCEDULED  VISIT.  THIS WILL IMPROVE THE QUALITY OF YOUR CARE.

## 2010-12-02 ENCOUNTER — Other Ambulatory Visit (HOSPITAL_COMMUNITY): Payer: Medicare Other

## 2010-12-02 NOTE — ED Provider Notes (Signed)
Medical screening examination/treatment/procedure(s) were performed by non-physician practitioner and as supervising physician I was immediately available for consultation/collaboration.   Juliet Rude. Rubin Payor, MD 12/02/10 [phone]

## 2010-12-06 NOTE — Assessment & Plan Note (Signed)
Recent recurrence of leukemia, being followed closely by oncology

## 2010-12-06 NOTE — Assessment & Plan Note (Signed)
Pt requests help with medication, unable to afford crestor which he states helps the most

## 2010-12-06 NOTE — Progress Notes (Signed)
  Subjective:    Patient ID: [name], male    DOB: 09/19/1945, 65 y.o.   MRN: [id number]  HPI Pt in for f/u of recent hospitalization for presumed pneumonia. He unfortunately has recently learned that his leukemia is back and will start treatment for this soon. He is trying to be as brave as possible about it. He does report some anxiety, which is understandable, but denies depression. He currently has no fever , chills or productive sputum, and believes he got ill when he recently went out of town on a road trip.     Review of Systems See HPI Denies recent fever or chills.c/o fatigue Denies sinus pressure, nasal congestion, ear pain or sore throat. Denies chest congestion, still c/o cough, no sputum, denies wheezing. Denies chest pains, palpitations and leg swelling Denies abdominal pain, nausea, vomiting,diarrhea or constipation.   Denies dysuria, frequency, hesitancy or incontinence. Denies joint pain, swelling and limitation in mobility. Denies headaches, seizures, numbness, or tingling.  Denies skin break down or rash.        Objective:   Physical Exam  Patient alert and oriented and in no cardiopulmonary distress.Chronically ill appearing  HEENT: No facial asymmetry, EOMI, no sinus tenderness,  oropharynx pink and moist.  Neck supple no adenopathy.  Chest: Clear to auscultation bilaterally.Decreased air entry  CVS: S1, S2 systolic murmur, no S3  ABD: Soft non tender. Bowel sounds normal.  Ext: No edema  MS: Decreased ROM spine,adequate in  shoulders, hips and knees.  Skin: Intact, no ulcerations or rash noted.  Psych: Good eye contact, normal affect. Memory intact not anxious or depressed appearing.  CNS: CN 2-12 intact, power, tone and sensation normal throughout.       Assessment & Plan:

## 2010-12-06 NOTE — Assessment & Plan Note (Signed)
Resolved, following recent hospitalization for same, asymptomatic, lung exam is within normal

## 2010-12-17 ENCOUNTER — Telehealth: Payer: Self-pay | Admitting: Family Medicine

## 2010-12-17 NOTE — Telephone Encounter (Signed)
Please document all crestor 20mg   samples 24 weeks total for this pt to collect, I expect that he will come tomorrow for them, I spoke directly with him today.Thanks

## 2010-12-18 LAB — COMPREHENSIVE METABOLIC PANEL
ALT: 35
Alkaline Phosphatase: 84
CO2: 28
Glucose, Bld: 103 — ABNORMAL HIGH
Potassium: 3.7
Sodium: 140
Total Protein: 5.7 — ABNORMAL LOW

## 2010-12-18 LAB — CBC
HCT: 36.5 — ABNORMAL LOW
Hemoglobin: 12.9 — ABNORMAL LOW
MCHC: 35.5
Platelets: 126 — ABNORMAL LOW
RDW: 14.7

## 2010-12-18 LAB — DIFFERENTIAL
Basophils Relative: 1
Eosinophils Absolute: 0.4
Monocytes Relative: 7
Neutrophils Relative %: 52

## 2010-12-18 MED ORDER — ROSUVASTATIN CALCIUM 20 MG PO TABS: 20.0000 mg | ORAL_TABLET | Freq: Every day | ORAL | Status: DC

## 2010-12-18 NOTE — Telephone Encounter (Signed)
documented

## 2010-12-21 ENCOUNTER — Ambulatory Visit: Payer: No Typology Code available for payment source | Admitting: Family Medicine

## 2010-12-23 ENCOUNTER — Encounter (HOSPITAL_COMMUNITY): Payer: Medicare Other | Attending: Oncology

## 2010-12-23 DIAGNOSIS — C9110 Chronic lymphocytic leukemia of B-cell type not having achieved remission: Secondary | ICD-10-CM | POA: Insufficient documentation

## 2010-12-23 LAB — DIFFERENTIAL
Basophils Absolute: 0.1 10*3/uL (ref 0.0–0.1)
Basophils Absolute: 0
Basophils Relative: 0 % (ref 0–1)
Basophils Relative: 0
Eosinophils Absolute: 0.4 10*3/uL (ref 0.0–0.7)
Eosinophils Relative: 2 % (ref 0–5)
Eosinophils Relative: 3
Lymphocytes Relative: 13
Neutro Abs: 4

## 2010-12-23 LAB — URINALYSIS, ROUTINE W REFLEX MICROSCOPIC
Ketones, ur: NEGATIVE
Nitrite: NEGATIVE
Specific Gravity, Urine: <1.005 — ABNORMAL LOW
Urobilinogen, UA: 0.2
pH: 6.5

## 2010-12-23 LAB — CBC
MCH: 34 pg (ref 26.0–34.0)
MCHC: 33.8 g/dL (ref 30.0–36.0)
MCHC: 35.5
MCV: 100.8 fL — ABNORMAL HIGH (ref 78.0–100.0)
Platelets: 75 10*3/uL — ABNORMAL LOW (ref 150–400)
Platelets: 89 — ABNORMAL LOW
RDW: 14.9 % (ref 11.5–15.5)
RDW: 14.7
WBC: 27.5 10*3/uL — ABNORMAL HIGH (ref 4.0–10.5)

## 2010-12-23 LAB — BASIC METABOLIC PANEL
BUN: 8
Calcium: 8.4
GFR calc non Af Amer: >60
Glucose, Bld: 123 — ABNORMAL HIGH

## 2010-12-23 NOTE — Progress Notes (Signed)
Labs drawn today for cbc/diff,ldh 

## 2010-12-24 LAB — DIFFERENTIAL
Basophils Relative: 1
Eosinophils Absolute: 0.3
Eosinophils Relative: 6 — ABNORMAL HIGH
Lymphs Abs: 2.1
Monocytes Absolute: 0.4
Monocytes Relative: 6
Neutrophils Relative %: 54

## 2010-12-24 LAB — COMPREHENSIVE METABOLIC PANEL
ALT: 38
AST: 25
Albumin: 3.8
Alkaline Phosphatase: 86
Calcium: 9.2
GFR calc Af Amer: >60
Glucose, Bld: 106 — ABNORMAL HIGH
Potassium: 3.9
Sodium: 140
Total Protein: 5.8 — ABNORMAL LOW

## 2010-12-24 LAB — CBC
Hemoglobin: 14.2
RDW: 15.1

## 2010-12-29 LAB — POCT I-STAT 4, (NA,K, GLUC, HGB,HCT)
Glucose, Bld: 106 — ABNORMAL HIGH
Glucose, Bld: 143 — ABNORMAL HIGH
Glucose, Bld: 99
HCT: 23 — ABNORMAL LOW
HCT: 24 — ABNORMAL LOW
HCT: 24 — ABNORMAL LOW
HCT: 30 — ABNORMAL LOW
Hemoglobin: 10.2 — ABNORMAL LOW
Hemoglobin: 7.8 — CL
Hemoglobin: 7.8 — CL
Hemoglobin: 8.2 — ABNORMAL LOW
Hemoglobin: 8.2 — ABNORMAL LOW
Potassium: 3.6
Potassium: 4
Sodium: 136
Sodium: 137
Sodium: 138
Sodium: 139

## 2010-12-29 LAB — POCT I-STAT, CHEM 8
BUN: 13
BUN: 8
Calcium, Ion: 1.22
Chloride: 103
Chloride: 98
Creatinine, Ser: 1.1
HCT: 24 — ABNORMAL LOW
Potassium: 4.2
Potassium: 5
Sodium: 140

## 2010-12-29 LAB — PROTIME-INR
INR: 1.2
INR: 1.9 — ABNORMAL HIGH
Prothrombin Time: 15.9 — ABNORMAL HIGH
Prothrombin Time: 19.9 — ABNORMAL HIGH

## 2010-12-29 LAB — TYPE AND SCREEN: Antibody Screen: NEGATIVE

## 2010-12-29 LAB — BASIC METABOLIC PANEL
BUN: 12
BUN: 15
BUN: 16
BUN: 16
CO2: 26
CO2: 27
CO2: 28
Calcium: 8 — ABNORMAL LOW
Calcium: 8.1 — ABNORMAL LOW
Calcium: 8.7
Chloride: 100
Chloride: 104
Chloride: 95 — ABNORMAL LOW
Creatinine, Ser: 0.85
Creatinine, Ser: 0.96
Creatinine, Ser: 1.06
GFR calc Af Amer: >60
GFR calc Af Amer: >60
GFR calc Af Amer: >60
GFR calc non Af Amer: >60
GFR calc non Af Amer: >60
GFR calc non Af Amer: >60
GFR calc non Af Amer: >60
GFR calc non Af Amer: >60
Glucose, Bld: 102 — ABNORMAL HIGH
Glucose, Bld: 103 — ABNORMAL HIGH
Glucose, Bld: 111 — ABNORMAL HIGH
Glucose, Bld: 140 — ABNORMAL HIGH
Potassium: 3.5
Potassium: 3.7
Potassium: 4.2
Potassium: 4.6
Potassium: 4.6
Sodium: 126 — ABNORMAL LOW
Sodium: 130 — ABNORMAL LOW
Sodium: 132 — ABNORMAL LOW
Sodium: 132 — ABNORMAL LOW
Sodium: 137
Sodium: 137

## 2010-12-29 LAB — POCT I-STAT 3, ART BLOOD GAS (G3+)
Acid-Base Excess: 1
O2 Saturation: 93
O2 Saturation: 98
TCO2: 26
pCO2 arterial: 35.9
pCO2 arterial: 42
pCO2 arterial: 46.3 — ABNORMAL HIGH
pH, Arterial: 7.353
pH, Arterial: 7.372
pO2, Arterial: 359 — ABNORMAL HIGH
pO2, Arterial: 92

## 2010-12-29 LAB — HEMOGLOBIN AND HEMATOCRIT, BLOOD
HCT: 23.8 — ABNORMAL LOW
Hemoglobin: 8.1 — ABNORMAL LOW

## 2010-12-29 LAB — CBC
HCT: 23.7 — ABNORMAL LOW
HCT: 24.7 — ABNORMAL LOW
HCT: 24.9 — ABNORMAL LOW
HCT: 25.7 — ABNORMAL LOW
HCT: 28.2 — ABNORMAL LOW
HCT: 38.3 — ABNORMAL LOW
Hemoglobin: 12.9 — ABNORMAL LOW
Hemoglobin: 8.1 — ABNORMAL LOW
Hemoglobin: 8.4 — ABNORMAL LOW
Hemoglobin: 8.4 — ABNORMAL LOW
Hemoglobin: 8.9 — ABNORMAL LOW
Hemoglobin: 8.9 — ABNORMAL LOW
Hemoglobin: 9.6 — ABNORMAL LOW
MCHC: 33.8
MCHC: 34
MCHC: 34.2
MCV: 94.8
MCV: 95.2
MCV: 95.5
MCV: 96
Platelets: 106 — ABNORMAL LOW
Platelets: 78 — ABNORMAL LOW
Platelets: 98 — ABNORMAL LOW
RBC: 2.62 — ABNORMAL LOW
RBC: 2.74 — ABNORMAL LOW
RBC: 3.87 — ABNORMAL LOW
RDW: 15.3
RDW: 16.4 — ABNORMAL HIGH
RDW: 16.7 — ABNORMAL HIGH
WBC: 6.3
WBC: 7
WBC: 7.1
WBC: 8.1

## 2010-12-29 LAB — PREPARE PLATELET PHERESIS

## 2010-12-29 LAB — COMPREHENSIVE METABOLIC PANEL
ALT: 32
Alkaline Phosphatase: 81
BUN: 8
CO2: 22
Calcium: 8.8
GFR calc non Af Amer: >60
Glucose, Bld: 141 — ABNORMAL HIGH
Potassium: 3.8
Sodium: 139
Total Protein: 5.6 — ABNORMAL LOW

## 2010-12-29 LAB — MAGNESIUM: Magnesium: 2.2

## 2010-12-29 LAB — BLOOD GAS, ARTERIAL
Bicarbonate: 23.9
O2 Saturation: 97.5
Patient temperature: 98.6
TCO2: 25.1
pO2, Arterial: 91.2

## 2010-12-29 LAB — GLUCOSE, CAPILLARY
Glucose-Capillary: 106 — ABNORMAL HIGH
Glucose-Capillary: 121 — ABNORMAL HIGH
Glucose-Capillary: 122 — ABNORMAL HIGH
Glucose-Capillary: 124 — ABNORMAL HIGH
Glucose-Capillary: 125 — ABNORMAL HIGH
Glucose-Capillary: 133 — ABNORMAL HIGH
Glucose-Capillary: 148 — ABNORMAL HIGH
Glucose-Capillary: 90

## 2010-12-29 LAB — PREPARE FRESH FROZEN PLASMA

## 2010-12-29 LAB — URINALYSIS, ROUTINE W REFLEX MICROSCOPIC
Bilirubin Urine: NEGATIVE
Hgb urine dipstick: NEGATIVE
Ketones, ur: NEGATIVE
Specific Gravity, Urine: 1.021
pH: 6

## 2010-12-29 LAB — APTT: aPTT: 41 — ABNORMAL HIGH

## 2010-12-29 LAB — HEMOGLOBIN A1C
Hgb A1c MFr Bld: 5.4
Mean Plasma Glucose: 108

## 2011-01-01 LAB — COMPREHENSIVE METABOLIC PANEL
Albumin: 3.1 g/dL — ABNORMAL LOW (ref 3.5–5.2)
Alkaline Phosphatase: 77 U/L (ref 39–117)
BUN: 9 mg/dL (ref 6–23)
CO2: 27 mEq/L (ref 19–32)
Chloride: 107 mEq/L (ref 96–112)
GFR calc non Af Amer: >60 mL/min (ref >60)
Potassium: 3.5 mEq/L (ref 3.5–5.1)
Total Bilirubin: 0.5 mg/dL (ref 0.3–1.2)

## 2011-01-01 LAB — DIFFERENTIAL
Basophils Absolute: 0 10*3/uL (ref 0.0–0.1)
Basophils Relative: 1 % (ref 0–1)
Eosinophils Relative: 7 % — ABNORMAL HIGH (ref 0–5)
Monocytes Absolute: 0.3 10*3/uL (ref 0.1–1.0)
Neutro Abs: 2.5 10*3/uL (ref 1.7–7.7)

## 2011-01-01 LAB — CBC
HCT: 32.9 % — ABNORMAL LOW (ref 39.0–52.0)
Hemoglobin: 11.2 g/dL — ABNORMAL LOW (ref 13.0–17.0)
Platelets: 117 10*3/uL — ABNORMAL LOW (ref 150–400)
RBC: 3.51 MIL/uL — ABNORMAL LOW (ref 4.22–5.81)
WBC: 4.9 10*3/uL (ref 4.0–10.5)

## 2011-01-04 LAB — DIFFERENTIAL
Eosinophils Absolute: 0.2
Eosinophils Relative: 6 — ABNORMAL HIGH
Lymphs Abs: 1.7
Monocytes Absolute: 0.3
Monocytes Relative: 7

## 2011-01-04 LAB — CBC
MCHC: 34
RBC: 3.95 — ABNORMAL LOW
RDW: 15.2

## 2011-01-04 LAB — COMPREHENSIVE METABOLIC PANEL
ALT: 36
AST: 20
Calcium: 8.9
GFR calc Af Amer: >60
Sodium: 137
Total Protein: 5.9 — ABNORMAL LOW

## 2011-01-07 ENCOUNTER — Encounter (HOSPITAL_COMMUNITY): Payer: Medicare Other | Attending: Oncology

## 2011-01-07 DIAGNOSIS — C9110 Chronic lymphocytic leukemia of B-cell type not having achieved remission: Secondary | ICD-10-CM | POA: Insufficient documentation

## 2011-01-07 DIAGNOSIS — Z452 Encounter for adjustment and management of vascular access device: Secondary | ICD-10-CM

## 2011-01-07 LAB — CBC
HCT: 33.9 % — ABNORMAL LOW (ref 39.0–52.0)
MCHC: 34.8 g/dL (ref 30.0–36.0)
MCV: 100.9 fL — ABNORMAL HIGH (ref 78.0–100.0)
RDW: 14.9 % (ref 11.5–15.5)

## 2011-01-07 LAB — LACTATE DEHYDROGENASE: LDH: 188 U/L (ref 94–250)

## 2011-01-07 LAB — DIFFERENTIAL
Basophils Absolute: 0 10*3/uL (ref 0.0–0.1)
Eosinophils Relative: 2 % (ref 0–5)
Lymphocytes Relative: 83 % — ABNORMAL HIGH (ref 12–46)
Monocytes Absolute: 0.7 10*3/uL (ref 0.1–1.0)

## 2011-01-07 MED ORDER — HEPARIN SOD (PORK) LOCK FLUSH 100 UNIT/ML IV SOLN
500.0000 [IU] | Freq: Once | INTRAVENOUS | Status: AC
  Administered 2011-01-07: 500 [IU] via INTRAVENOUS
  Filled 2011-01-07: qty 5

## 2011-01-07 MED ORDER — SODIUM CHLORIDE 0.9 % IJ SOLN
10.0000 mL | Freq: Once | INTRAMUSCULAR | Status: AC
  Administered 2011-01-07: 10 mL via INTRAVENOUS
  Filled 2011-01-07: qty 10

## 2011-01-07 MED ORDER — SODIUM CHLORIDE 0.9 % IJ SOLN
INTRAMUSCULAR | Status: AC
  Filled 2011-01-07: qty 10

## 2011-01-07 MED ORDER — HEPARIN SOD (PORK) LOCK FLUSH 100 UNIT/ML IV SOLN
INTRAVENOUS | Status: AC
  Filled 2011-01-07: qty 5

## 2011-01-07 NOTE — Progress Notes (Signed)
[name] presented for Portacath access and flush. Proper placement of portacath confirmed by CXR. Portacath located lt chest wall accessed with  H 20 needle. Good blood return present. Portacath flushed with 20ml NS and 500U/74ml Heparin and needle removed intact. Procedure without incident. Patient tolerated procedure well.

## 2011-01-08 LAB — COMPREHENSIVE METABOLIC PANEL
Alkaline Phosphatase: 106
BUN: 17
CO2: 30
Chloride: 104
GFR calc non Af Amer: 48 — ABNORMAL LOW
Glucose, Bld: 113 — ABNORMAL HIGH
Potassium: 4.2
Total Bilirubin: 1
Total Protein: 6

## 2011-01-08 LAB — CBC
HCT: 38.4 — ABNORMAL LOW
Hemoglobin: 13.4
RDW: 14.9 — ABNORMAL HIGH

## 2011-01-08 LAB — DIFFERENTIAL
Basophils Absolute: 0
Basophils Relative: 1
Neutro Abs: 3.1
Neutrophils Relative %: 52

## 2011-01-08 LAB — LACTATE DEHYDROGENASE: LDH: 164

## 2011-01-12 LAB — PROTIME-INR
INR: 1
Prothrombin Time: 13.5

## 2011-01-12 LAB — BASIC METABOLIC PANEL
Calcium: 8.7
Creatinine, Ser: 1
GFR calc Af Amer: >60
GFR calc non Af Amer: >60
Sodium: 138

## 2011-01-12 LAB — CBC
Hemoglobin: 11.9 — ABNORMAL LOW
RBC: 3.45 — ABNORMAL LOW

## 2011-01-12 LAB — APTT: aPTT: 27

## 2011-01-13 ENCOUNTER — Other Ambulatory Visit (HOSPITAL_COMMUNITY): Payer: Medicare Other

## 2011-01-14 LAB — COMPREHENSIVE METABOLIC PANEL
AST: 18
Alkaline Phosphatase: 85
CO2: 24
Chloride: 112
Creatinine, Ser: 0.89
GFR calc Af Amer: >60
GFR calc non Af Amer: >60
Potassium: 3.7
Total Bilirubin: 0.5

## 2011-01-14 LAB — DIFFERENTIAL
Basophils Absolute: 0
Basophils Relative: 1
Eosinophils Absolute: 0.4
Eosinophils Relative: 7 — ABNORMAL HIGH
Lymphocytes Relative: 32
Monocytes Absolute: 0.3

## 2011-01-14 LAB — CBC
MCHC: 34.8
Platelets: 134 — ABNORMAL LOW
RBC: 3.11 — ABNORMAL LOW
WBC: 4.9

## 2011-01-14 LAB — LACTATE DEHYDROGENASE: LDH: 143

## 2011-01-18 ENCOUNTER — Encounter (HOSPITAL_BASED_OUTPATIENT_CLINIC_OR_DEPARTMENT_OTHER): Payer: Medicare Other | Admitting: Oncology

## 2011-01-18 VITALS — BP 113/72 | HR 74 | Temp 97.5°F | Wt 249.6 lb

## 2011-01-18 DIAGNOSIS — C9110 Chronic lymphocytic leukemia of B-cell type not having achieved remission: Secondary | ICD-10-CM

## 2011-01-18 NOTE — Progress Notes (Signed)
[name] Overman, MD, MD [address] [place] [address]  1. CLL  CBC, CBC, CBC, Differential, Differential, Differential, Lactate dehydrogenase, Lactate dehydrogenase, Lactate dehydrogenase, CT Abdomen Pelvis W Contrast, Basic metabolic panel    INTERVAL HISTORY: [name] 65 y.o. male returns for  regular  visit for followup of Recurrent CLL  Patient reports fatigue. He denies any other complaints. He denies any night sweats, fevers, chills. His appetite is strong. No B. symptoms.   The patient and I spent some time going over his CLL diagnosis. I personally reviewed and went over laboratory results with the patient. He understands lab work at this point time remain stable. Specimen time going over his previous lab work and compare to today's lab work. His white blood cell count has decreased over time. His hemoglobin has remained fairly stable.  Platelet count has been decreasing slightly.  The patient does admit to early satiety which is likely secondary to splenomegaly.  If and how the patient going over the plan. He understands that we will obtain lab work monthly and a CT scan of the abdomen and pelvis with contrast in 3 months time. He'll then return in 3 months go over laboratory work and results of CT scan. We will certainly see the patient much sooner if necessary pending his laboratory results.   Past Medical History  Diagnosis Date  . CLL (chronic lymphoblastic leukemia)   . Hodgkin's disease   . Aortic stenosis   . Atrial fibrillation   . Coronary atherosclerosis of native coronary artery   . Hyperlipidemia   . Essential hypertension, benign   . BPH (benign prostatic hypertrophy)   . Elevated WBC count   . Arthritis   . Acid reflux     has HODGKIN'S DISEASE; CLL; HYPERLIPIDEMIA; OBESITY; CORONARY ATHEROSCLEROSIS NATIVE CORONARY ARTERY; AORTIC STENOSIS; ATRIAL FIBRILLATION; FEVER UNSPECIFIED; OTHER DYSPHAGIA; AORTIC VALVE REPLACEMENT, HX OF;  ESSENTIAL HYPERTENSION, BENIGN; NECK PAIN, LEFT; GERD (gastroesophageal reflux disease); and Pneumonia on his problem list.      has no known allergies.  [name] does not currently have medications on file.  Past Surgical History  Procedure Date  . Eye surgery   . 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 Aortic valve replacement 10/09    23mm Magna Pericardial   . Esophagogastroduodenoscopy     scrapping of throat and stretching  . Lithotripsy   . Port-a-cath removal   . Bone marrow aspiration     Denies any headaches, dizziness, double vision, fevers, chills, night sweats, nausea, vomiting, diarrhea, constipation, chest pain, heart palpitations, shortness of breath, blood in stool, black tarry stool, urinary pain, urinary burning, urinary frequency, hematuria.   PHYSICAL EXAMINATION  ECOG PERFORMANCE STATUS: 2 - Symptomatic, <50% confined to bed  Filed Vitals:   01/18/11 1112  BP: 113/72  Pulse: 74  Temp: 97.5 F (36.4 C)    GENERAL:alert, no distress, well nourished, well developed, comfortable, cooperative, ill looking and obese SKIN: skin color, texture, turgor are normal HEAD: Normocephalic EYES: normal EARS: External ears normal OROPHARYNX:mucous membranes are moist  NECK: supple, no adenopathy, no bruits, no JVD, thyroid normal size, non-tender, without nodularity, no stridor, non-tender, trachea midline LYMPH:  no palpable lymphadenopathy, splenomegaly noted  BREAST:not examined LUNGS: clear to auscultation and percussion HEART: regular rate & rhythm, no murmurs, no gallops, S1 normal and S2 normal ABDOMEN:abdomen soft, non-tender, obese, normal bowel sounds and splenomegaly noted BACK: Back symmetric, no curvature.  EXTREMITIES:less then 2 second capillary refill, no joint deformities, effusion, or inflammation, no edema, no skin discoloration, no clubbing, no cyanosis  NEURO: alert & oriented x 3 with fluent speech, no focal motor/sensory  deficits, gait normal    LABORATORY DATA: CBC    Component Value Date/Time   WBC 23.7* 01/07/2011 1409   RBC 3.36* 01/07/2011 1409   HGB 11.8* 01/07/2011 1409   HCT 33.9* 01/07/2011 1409   PLT 67* 01/07/2011 1409   MCV 100.9* 01/07/2011 1409   MCH 35.1* 01/07/2011 1409   MCHC 34.8 01/07/2011 1409   RDW 14.9 01/07/2011 1409   LYMPHSABS 19.7* 01/07/2011 1409   MONOABS 0.7 01/07/2011 1409   EOSABS 0.6 01/07/2011 1409   BASOSABS 0.0 01/07/2011 1409        RADIOGRAPHIC STUDIES:  10/02/2010  *RADIOLOGY REPORT*  Clinical Data: Hodgkin's lymphoma, CLL, follow-up  CT CHEST, ABDOMEN AND PELVIS WITH CONTRAST  Technique: Multidetector CT imaging of the chest, abdomen and  pelvis was performed following the standard protocol during bolus  administration of intravenous contrast. Sagittal and coronal MPR  images reconstructed from axial data set.  Contrast: Dilute oral contrast. 100 ml Omnipaque 300 IV.  Comparison: CT abdomen and pelvis 03/23/2010, PET CT 06/25/2009  CT CHEST  Findings:  Post median sternotomy and AVR.  Scattered atherosclerotic calcifications aorta and coronary  arteries.  Numerous but normal-sized axillary, mediastinal, and hilar lymph  nodes.  Single enlarged azygo esophageal recess node 1.5 x 1.2 cm image 27.  Borderline enlarged right paratracheal node 1.1 x 1.0 cm image 14.  Minimal gynecomastia.  Nonspecific 4 mm lingular nodule, not seen on prior exam.  Minimal infiltrate or atelectasis in the left lower lobe.  Lungs otherwise clear.  No acute osseous abnormalities.  IMPRESSION:  Numerous normal-sized lymph nodes in the chest a single minimally  enlarged azygo esophageal recess node and a borderline enlarged  right paratracheal node.  Nonspecific 4 mm diameter lingular nodule.  Small focus of infiltrate or atelectasis at left lower lobe  CT ABDOMEN AND PELVIS  Findings:  Spleen appears enlarged, 14.4 x 7.6 x 15.5 cm.  No focal abnormalities of  the liver, spleen, pancreas, or adrenal  glands.  Symmetric nephrograms with 2 mm diameter nonobstructing mid right  renal calculus.  Retroaortic left renal vein.  Enlarged aortocaval lymph node 1.6 x 1.1 cm image 68.  Enlarged periportal nodes including 1.8 x 1.3 cm and 2.4 x 1.2 cm  nodes image 55.  Enlarged portocaval lymph node 3.8 x 1.1 cm image 58.  Few additional scattered normal-sized mesenteric and  retroperitoneal nodes.  Normal appendix.  Suspect gallstone at lower gallbladder segment.  Bilateral inguinal hernias.  Stomach decompressed, suboptimally evaluated.  Bowel loops unremarkable.  No mass, free fluid or inflammatory process.  Bones appear demineralized.  IMPRESSION:  Few minimally enlarged abdominal lymph nodes as above.  Splenomegaly.  Tiny nonobstructing right renal calculus.  Bilateral inguinal hernias containing fat.  Suspect cholelithiasis.  Original Report Authenticated By: Lollie Marrow, M.D.    PATHOLOGY: 1. Peripheral blood flow cytometry-chronic with ascitic leukemia, abnormal cells and give population: 76%. Abnormal cells are positive for CD5 CD19 CD20 CD20 1 CD22 CD23 HLA-DR and lambda.    ASSESSMENT:  1. Recurrent chronic lymphocytic leukemia verified by blood  flow cytometry that was on 10/06/2010 and that was evaluated by Dr. Guerry Bruin at [place]. 2. H/O Hodgkin's Disease, mixed cellularity type, Stage IIIB diagnosed in September 2006. 3. history of stage IV CLL  presenting in 2004    PLAN:  1. Lab work monthly: CBC diff, LDH 2. CT abd/pelvis with contrast in 3 months 3. Return in 3 months for follow-up 4. I personally reviewed and went over laboratory results with the patient.    All questions were answered. The patient knows to call the clinic with any problems, questions or concerns. We can certainly see the patient much sooner if necessary.  The patient and plan discussed with Glenford Peers, MD and he is in agreement with the  aforementioned.   Shriley Joffe

## 2011-01-18 NOTE — Patient Instructions (Signed)
[facility]  Discharge Instructions  RECOMMENDATIONS MADE BY THE CONSULTANT AND ANY TEST RESULTS WILL BE SENT TO YOUR REFERRING DOCTOR.       INSTRUCTIONS GIVEN AND DISCUSSED: Lab work monthly. CT Scan in 3 months.  SPECIAL INSTRUCTIONS/FOLLOW-UP: See MD in 3 months after CT Scan.   I acknowledge that I have been informed and understand all the instructions given to me and received a copy. I do not have any more questions at this time, but understand that I may call the Specialty Clinic at [facility] at [phone] during business hours should I have any further questions or need assistance in obtaining follow-up care.    __________________________________________  _____________  __________ Signature of Patient or Authorized Representative            Date                   Time    __________________________________________ Nurse's Signature

## 2011-01-20 ENCOUNTER — Encounter: Payer: Self-pay | Admitting: Family Medicine

## 2011-01-20 ENCOUNTER — Ambulatory Visit (INDEPENDENT_AMBULATORY_CARE_PROVIDER_SITE_OTHER): Payer: Medicare Other | Admitting: Family Medicine

## 2011-01-20 VITALS — BP 92/62 | HR 72 | Temp 98.9°F | Resp 16 | Ht 70.0 in | Wt 246.1 lb

## 2011-01-20 DIAGNOSIS — J40 Bronchitis, not specified as acute or chronic: Secondary | ICD-10-CM

## 2011-01-20 MED ORDER — CEFTRIAXONE SODIUM 1 G IJ SOLR
1.0000 g | Freq: Once | INTRAMUSCULAR | Status: AC
  Administered 2011-01-20: 1 g via INTRAMUSCULAR

## 2011-01-20 MED ORDER — DOXYCYCLINE HYCLATE 100 MG PO TABS: 100.0000 mg | ORAL_TABLET | Freq: Two times a day (BID) | ORAL | Status: AC

## 2011-01-20 NOTE — Progress Notes (Signed)
  Subjective:    Patient ID: [name], male    DOB: 09/17/1945, 65 y.o.   MRN: [id number]  HPI  Feeling very sick for past 3-4 days- More fatigued than usual, cough with minimal production, runny nose, no fever, no chills, +muscle aches, no N/V +flu shot, no SOB, no CP  Took benadryl this AM Has history of recurrent CLL No sick contacts   Review of Systems     Objective:   Physical Exam GEN- NAD, alert and oriented x3, fatigued appearing, vitals noted HEENT- PERRL, EOMI, non injected sclera, MMM, oropharynx clear Neck- Supple,  Nodes- no cervical nodes CVS- RRR, no murmur RESP-Rhonchi in right base, normal WOB, harsh cough, no production during visit EXT- No edema Pulses- Radial, DP- 2+        Assessment & Plan:

## 2011-01-20 NOTE — Patient Instructions (Signed)
I am treating you for an upper respiratory infection. I do not want this to turn into a pneumonia, You have been given a shot of antibiotic- Rocephin Take the rest of the antibiotic- Doxycyline 100mg  twice a day for the next 7 days If you get worse please call before the weekend If you have high fever, difficulty breathing, chest pain, please go to the ER

## 2011-01-20 NOTE — Progress Notes (Deleted)
  Subjective:    Patient ID: [name], male    DOB: April 21, 1945, 65 y.o.   MRN: [id number]  HPI   Cough with mild produc   Review of Systems     Objective:   Physical Exam        Assessment & Plan:

## 2011-01-21 DIAGNOSIS — J40 Bronchitis, not specified as acute or chronic: Secondary | ICD-10-CM | POA: Insufficient documentation

## 2011-01-21 NOTE — Assessment & Plan Note (Signed)
Pt is early on in illness, however with abnormal exam and immunocompromise with leukemia being followed by Onc. Pt stated he has no money for meds- will need to be very cheap or he wont be able to get it. Given a shot of Rocephin 1 gram, based on finances, will follow with Doxycycline. Pt given red flags Currently no cardiorespiratory compromise

## 2011-01-29 ENCOUNTER — Other Ambulatory Visit: Payer: Self-pay | Admitting: Cardiology

## 2011-02-08 ENCOUNTER — Encounter (HOSPITAL_COMMUNITY): Payer: Medicare Other | Attending: Oncology

## 2011-02-08 DIAGNOSIS — C9110 Chronic lymphocytic leukemia of B-cell type not having achieved remission: Secondary | ICD-10-CM | POA: Insufficient documentation

## 2011-02-08 LAB — CBC
Hemoglobin: 11.9 g/dL — ABNORMAL LOW (ref 13.0–17.0)
MCH: 34.6 pg — ABNORMAL HIGH (ref 26.0–34.0)
MCHC: 33.3 g/dL (ref 30.0–36.0)
Platelets: 80 10*3/uL — ABNORMAL LOW (ref 150–400)
RDW: 15.1 % (ref 11.5–15.5)

## 2011-02-08 LAB — DIFFERENTIAL
Basophils Absolute: 0 10*3/uL (ref 0.0–0.1)
Eosinophils Absolute: 0.6 10*3/uL (ref 0.0–0.7)
Lymphocytes Relative: 84 % — ABNORMAL HIGH (ref 12–46)
Monocytes Absolute: 1.4 10*3/uL — ABNORMAL HIGH (ref 0.1–1.0)
Neutrophils Relative %: 9 % — ABNORMAL LOW (ref 43–77)

## 2011-02-08 NOTE — Progress Notes (Signed)
Labs drawn today for cbc/diff,ldh 

## 2011-02-17 ENCOUNTER — Encounter (HOSPITAL_COMMUNITY): Payer: Medicare Other

## 2011-02-25 ENCOUNTER — Encounter (HOSPITAL_BASED_OUTPATIENT_CLINIC_OR_DEPARTMENT_OTHER): Payer: Medicare Other

## 2011-02-25 DIAGNOSIS — Z452 Encounter for adjustment and management of vascular access device: Secondary | ICD-10-CM

## 2011-02-25 DIAGNOSIS — C9110 Chronic lymphocytic leukemia of B-cell type not having achieved remission: Secondary | ICD-10-CM

## 2011-02-25 MED ORDER — SODIUM CHLORIDE 0.9 % IJ SOLN
10.0000 mL | INTRAMUSCULAR | Status: DC | PRN
  Administered 2011-02-25: 10 mL via INTRAVENOUS
  Filled 2011-02-25: qty 10

## 2011-02-25 MED ORDER — HEPARIN SOD (PORK) LOCK FLUSH 100 UNIT/ML IV SOLN
INTRAVENOUS | Status: AC
  Filled 2011-02-25: qty 5

## 2011-02-25 MED ORDER — HEPARIN SOD (PORK) LOCK FLUSH 100 UNIT/ML IV SOLN
500.0000 [IU] | Freq: Once | INTRAVENOUS | Status: AC
  Administered 2011-02-25: 500 [IU] via INTRAVENOUS
  Filled 2011-02-25: qty 5

## 2011-02-25 MED ORDER — SODIUM CHLORIDE 0.9 % IJ SOLN
INTRAMUSCULAR | Status: AC
  Filled 2011-02-25: qty 20

## 2011-02-25 NOTE — Progress Notes (Signed)
Addended by: Hester Mates A on: 02/25/2011 11:31 AM   Modules accepted: Orders

## 2011-02-25 NOTE — Progress Notes (Signed)
Anderson W Bredeson presented for Portacath access and flush. Proper placement of portacath confirmed by CXR. Portacath located right chest wall accessed with  H 20 needle. Good blood return present. Portacath flushed with 20ml NS and 500U/5ml Heparin and needle removed intact. Procedure without incident. Patient tolerated procedure well.   

## 2011-03-04 ENCOUNTER — Encounter: Payer: Self-pay | Admitting: Family Medicine

## 2011-03-05 ENCOUNTER — Encounter: Payer: Self-pay | Admitting: Family Medicine

## 2011-03-10 ENCOUNTER — Other Ambulatory Visit (HOSPITAL_COMMUNITY): Payer: Medicare Other

## 2011-03-10 ENCOUNTER — Encounter: Payer: Self-pay | Admitting: Family Medicine

## 2011-03-10 ENCOUNTER — Ambulatory Visit: Payer: Medicare Other | Admitting: Family Medicine

## 2011-03-11 ENCOUNTER — Encounter (HOSPITAL_COMMUNITY): Payer: Medicare Other | Attending: Oncology

## 2011-03-11 DIAGNOSIS — C9110 Chronic lymphocytic leukemia of B-cell type not having achieved remission: Secondary | ICD-10-CM

## 2011-03-11 LAB — DIFFERENTIAL
Basophils Absolute: 0 10*3/uL (ref 0.0–0.1)
Eosinophils Absolute: 0 10*3/uL (ref 0.0–0.7)
Eosinophils Relative: 0 % (ref 0–5)
Metamyelocytes Relative: 0 %
Myelocytes: 0 %
Neutro Abs: 2.9 10*3/uL (ref 1.7–7.7)
Neutrophils Relative %: 8 % — ABNORMAL LOW (ref 43–77)
Promyelocytes Absolute: 0 %
nRBC: 0 /100 WBC

## 2011-03-11 LAB — CBC
MCH: 35 pg — ABNORMAL HIGH (ref 26.0–34.0)
MCHC: 33 g/dL (ref 30.0–36.0)
MCV: 106.1 fL — ABNORMAL HIGH (ref 78.0–100.0)
Platelets: 75 10*3/uL — ABNORMAL LOW (ref 150–400)
RBC: 3.43 MIL/uL — ABNORMAL LOW (ref 4.22–5.81)

## 2011-03-11 LAB — COMPREHENSIVE METABOLIC PANEL
ALT: 18 U/L (ref 0–53)
Albumin: 3.8 g/dL (ref 3.5–5.2)
Alkaline Phosphatase: 81 U/L (ref 39–117)
Chloride: 105 mEq/L (ref 96–112)
Glucose, Bld: 120 mg/dL — ABNORMAL HIGH (ref 70–99)
Potassium: 5.2 mEq/L — ABNORMAL HIGH (ref 3.5–5.1)
Sodium: 142 mEq/L (ref 135–145)
Total Bilirubin: 0.5 mg/dL (ref 0.3–1.2)
Total Protein: 6.2 g/dL (ref 6.0–8.3)

## 2011-03-11 NOTE — Progress Notes (Signed)
[name] presented for labwork. Labs per MD order drawn via Peripheral Line 24 gauge needle inserted in rt ac  Good blood return present. Procedure without incident.  Needle removed intact. Patient tolerated procedure well.

## 2011-04-05 ENCOUNTER — Other Ambulatory Visit (HOSPITAL_COMMUNITY): Payer: Medicare Other

## 2011-04-06 ENCOUNTER — Ambulatory Visit (HOSPITAL_COMMUNITY)
Admission: RE | Admit: 2011-04-06 | Discharge: 2011-04-06 | Disposition: A | Discharge status: Home / Self Care | Payer: Medicare Other | Source: Ambulatory Visit | Attending: Oncology | Admitting: Oncology

## 2011-04-06 DIAGNOSIS — R161 Splenomegaly, not elsewhere classified: Secondary | ICD-10-CM | POA: Insufficient documentation

## 2011-04-06 DIAGNOSIS — J984 Other disorders of lung: Secondary | ICD-10-CM | POA: Insufficient documentation

## 2011-04-06 DIAGNOSIS — C9110 Chronic lymphocytic leukemia of B-cell type not having achieved remission: Secondary | ICD-10-CM

## 2011-04-06 DIAGNOSIS — K8020 Calculus of gallbladder without cholecystitis without obstruction: Secondary | ICD-10-CM | POA: Insufficient documentation

## 2011-04-06 MED ORDER — IOHEXOL 300 MG/ML  SOLN
100.0000 mL | Freq: Once | INTRAMUSCULAR | Status: AC | PRN
  Administered 2011-04-06: 100 mL via INTRAVENOUS

## 2011-04-07 ENCOUNTER — Other Ambulatory Visit: Payer: Self-pay | Admitting: Cardiology

## 2011-04-08 LAB — HEPATIC FUNCTION PANEL
Bilirubin, Direct: 0.1 mg/dL (ref 0.0–0.3)
Indirect Bilirubin: 0.4 mg/dL (ref 0.0–0.9)
Total Bilirubin: 0.5 mg/dL (ref 0.3–1.2)

## 2011-04-08 LAB — LIPID PANEL: Total CHOL/HDL Ratio: 11.8 Ratio

## 2011-04-09 ENCOUNTER — Encounter (HOSPITAL_COMMUNITY): Payer: Medicare Other | Attending: Oncology | Admitting: Oncology

## 2011-04-09 ENCOUNTER — Encounter (HOSPITAL_COMMUNITY): Payer: Self-pay | Admitting: Oncology

## 2011-04-09 DIAGNOSIS — C9110 Chronic lymphocytic leukemia of B-cell type not having achieved remission: Secondary | ICD-10-CM | POA: Insufficient documentation

## 2011-04-09 DIAGNOSIS — R7309 Other abnormal glucose: Secondary | ICD-10-CM

## 2011-04-09 DIAGNOSIS — C9112 Chronic lymphocytic leukemia of B-cell type in relapse: Secondary | ICD-10-CM

## 2011-04-09 DIAGNOSIS — I878 Other specified disorders of veins: Secondary | ICD-10-CM

## 2011-04-09 DIAGNOSIS — C8190 Hodgkin lymphoma, unspecified, unspecified site: Secondary | ICD-10-CM

## 2011-04-09 DIAGNOSIS — I998 Other disorder of circulatory system: Secondary | ICD-10-CM

## 2011-04-09 MED ORDER — HEPARIN SOD (PORK) LOCK FLUSH 100 UNIT/ML IV SOLN
500.0000 [IU] | Freq: Once | INTRAVENOUS | Status: AC
  Administered 2011-04-09: 500 [IU] via INTRAVENOUS
  Filled 2011-04-09: qty 5

## 2011-04-09 MED ORDER — HEPARIN SOD (PORK) LOCK FLUSH 100 UNIT/ML IV SOLN
INTRAVENOUS | Status: AC
  Administered 2011-04-09: 500 [IU] via INTRAVENOUS
  Filled 2011-04-09: qty 5

## 2011-04-09 MED ORDER — SODIUM CHLORIDE 0.9 % IJ SOLN
10.0000 mL | INTRAMUSCULAR | Status: DC | PRN
  Administered 2011-04-09: 10 mL via INTRAVENOUS
  Filled 2011-04-09: qty 10

## 2011-04-09 MED ORDER — SODIUM CHLORIDE 0.9 % IJ SOLN
INTRAMUSCULAR | Status: AC
  Administered 2011-04-09: 10 mL via INTRAVENOUS
  Filled 2011-04-09: qty 10

## 2011-04-09 NOTE — Progress Notes (Signed)
[name] presented for Portacath access and flush. Proper placement of portacath confirmed by CXR. Portacath located right chest wall accessed with  H 20 needle. Good blood return present. Portacath flushed with 20ml NS and 500U/5ml Heparin and needle removed intact. Procedure without incident. Patient tolerated procedure well.   

## 2011-04-09 NOTE — Patient Instructions (Signed)
[name]  [id number] [birth year]   [facility]  Discharge Instructions  RECOMMENDATIONS MADE BY THE CONSULTANT AND ANY TEST RESULTS WILL BE SENT TO YOUR REFERRING DOCTOR.   EXAM FINDINGS BY MD TODAY AND SIGNS AND SYMPTOMS TO REPORT TO CLINIC OR PRIMARY MD: At some point we will need to treat you for your CLL but not right now.  MEDICATIONS PRESCRIBED: none   INSTRUCTIONS GIVEN AND DISCUSSED: Other :  Report fevers, infections, night sweats, new lumps etc.  SPECIAL INSTRUCTIONS/FOLLOW-UP: Return to Clinic: Port flushes every 6 weeks, blood work in 2 months and to see Dr. [name] after lab results available.   I acknowledge that I have been informed and understand all the instructions given to me and received a copy. I do not have any more questions at this time, but understand that I may call the Specialty Clinic at [facility] at [phone] during business hours should I have any further questions or need assistance in obtaining follow-up care.    __________________________________________  _____________  __________ Signature of Patient or Authorized Representative            Date                   Time    __________________________________________ Nurse's Signature

## 2011-04-09 NOTE — Progress Notes (Signed)
This office note has been dictated.

## 2011-04-10 NOTE — Progress Notes (Signed)
CC:   [name]. Lodema Hong, M.D. Miachel Roux, MD  DIAGNOSES: 1. Recurrent chronic lymphocytic leukemia. 2. History of Hodgkin disease, mixed cellular type, stage IIIB     diagnosed in September 2006, treated with ABVD x6 cycles with     bleomycin being dropped after the 1st several cycles due to     decrease in his DLCO and I substituted VP-16 for that.  He is in a     complete remission from the Hodgkin's. 3. Status post fludarabine, Cytoxan, and rituximab therapy for 3     cycles when he presented with CLL in 2004. 4. Obesity. 5. Insect bites, multiple places on his body. 6. Possible early onset diabetes mellitus with mild elevation in his     sugars lately.  We will check hemoglobin A1c next time he is here. 7. Pan-hypogammaglobulinemia. 8. Severe deconditioning. 9. History of Agent Orange exposure during the [place] War and he     states that just within the last 2 months he has been granted full     disability from the [place]. 10.Coronary artery disease, status post bypass grafting x1 in October     2009. 11.Peripheral vascular disease. 12.Aortic stenosis with aortic valve replacement. 13.Benign prostatic hypertrophy. 14.Hyperlipidemia. 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 16.Circumcision in the past, leaving him with difficulty with a     uniform stream and occasional incontinence. 17.Thrombocytopenia persistent ever since his chemotherapy for his     Hodgkin disease, but getting slightly worse. Cadarius's labs show that his white count is rising.  It is about the same as it was in August, but still slightly higher.  It was 36,700 on the 13th of December and his differential shows 33,000 lymphocytes.  His platelets in December were 75,000, hemoglobin 12 g which is very stable. Total protein was 6.2, LDH 229, which is still within the normal range. His glucose was 120 on the 13th of December and prior to that it was 138 in August.  He has a little rash  underneath both breasts and it looks like early Candida potentially, but he also has pronounced bug bite reactions on his back, abdomen, chest, legs, thighs.  Most of these insect bite-like areas are basically in the trunk.  He does hold a cat in his lap frequently. He has not checked the cat for fleas.  Other than that, he has no B symptomatology.  PHYSICAL EXAMINATION:  Vital Signs:  Today he still has no fever, weight is still quite high at 250 pounds, his BMI is 37.6.  He denies any pain. Blood pressure 106/63 left arm sitting position, pulse 72 and regular, respirations 16 and unlabored.  Skin:  Warm and dry to the touch. Lymph:  He has no obvious adenopathy in the cervical, supraclavicular, infraclavicular, axillary, or inguinal areas.  Abdomen:  I cannot feel distinct splenomegaly, but he is very overweight, but his CT scan clearly showed this to be present with the last CT scan on 04/06/2011 showing enlargement of the spleen compared to the previous one in July. The lymph nodes appeared to be stable.  He also had gallstones.  The rest of his exam:  Lungs:  He had clear lung fields.  Heart:  Did not reveal an S3 gallop.  I did not hear a distinct murmur.  Breasts: He has gynecomastia which is not new or different.  Abdomen:  Obese without obvious hepatomegaly or splenomegaly.  Extremities:  He has no peripheral edema.  So he  is going to need therapy I suspect at some point, but he does not need it yet in my opinion.  So I will see him in 8 weeks, keep a close watch on him, and if his platelets diminish further or if his white count rises and he becomes anemic, then I think we will have to treat him or if he has any B symptomatology.  I will see him sooner if need be.    ______________________________ [name]. [name], MD ESN/MEDQ  D:  04/09/2011  T:  04/10/2011  Job:  [id number]

## 2011-04-13 ENCOUNTER — Ambulatory Visit: Payer: Medicare Other | Admitting: Cardiology

## 2011-04-14 ENCOUNTER — Encounter: Payer: Self-pay | Admitting: Cardiology

## 2011-04-14 ENCOUNTER — Ambulatory Visit (INDEPENDENT_AMBULATORY_CARE_PROVIDER_SITE_OTHER): Payer: Medicare Other | Admitting: Cardiology

## 2011-04-14 VITALS — BP 111/69 | HR 71 | Resp 16 | Ht 69.0 in | Wt 250.0 lb

## 2011-04-14 DIAGNOSIS — E782 Mixed hyperlipidemia: Secondary | ICD-10-CM

## 2011-04-14 DIAGNOSIS — I2510 Atherosclerotic heart disease of native coronary artery without angina pectoris: Secondary | ICD-10-CM

## 2011-04-14 DIAGNOSIS — Z954 Presence of other heart-valve replacement: Secondary | ICD-10-CM

## 2011-04-14 DIAGNOSIS — I10 Essential (primary) hypertension: Secondary | ICD-10-CM

## 2011-04-14 NOTE — Patient Instructions (Signed)
**Note De-Identified [name] Obfuscation** Your physician has recommended you make the following change in your medication: start taking Omega 3 1000 mg twice daily  Your physician recommends that you return for lab work in: 6 months, just before next visit  Your physician recommends that you schedule a follow-up appointment in: 6 months

## 2011-04-14 NOTE — Assessment & Plan Note (Signed)
Recommended adding omega-3 supplements to Crestor. Followup fasting lipid profile and liver function tests for his next visit.

## 2011-04-14 NOTE — Assessment & Plan Note (Signed)
Remains stable on medical therapy. No changes made today. Continue observation. We also discussed exercise and diet.

## 2011-04-14 NOTE — Assessment & Plan Note (Signed)
Blood pressure well-controlled today. 

## 2011-04-14 NOTE — Assessment & Plan Note (Signed)
Stable by echocardiogram from last year.

## 2011-04-14 NOTE — Progress Notes (Signed)
Clinical Summary [name] is a 66 y.o.male presenting for followup. He was seen in August.  He remains relatively stable, denies any significant angina. No change in baseline dyspnea on exertion. States that he is interested in trying to exercise more at the [facility], and we talked about some appropriate aerobic exercises he could consider.  He reports compliance with his medications.  We reviewed his recent lipid numbers. Total cholesterol is actually well controlled, however he has worsening hypertriglyceridemia. We discussed omega-3 supplements. He seems to be reasonably compliant with his Crestor.  Echocardiogram from last year was reviewed.   No Known Allergies  Current Outpatient Prescriptions  Medication Sig Dispense Refill  . acetaminophen (TYLENOL) 500 MG tablet Take 1,000 mg by mouth once as needed. For pain       . acyclovir (ZOVIRAX) 200 MG capsule Take 200 mg by mouth 2 (two) times daily. Take one tablet by mouth twice a day       . aspirin (ASPIRIN LOW DOSE) 81 MG EC tablet Take 81 mg by mouth daily. Take one tablet by mouth daily       . folic acid (FOLVITE) 1 MG tablet Take 1 mg by mouth daily. Take one tablet by mouth once a day       . metoprolol (LOPRESSOR) 50 MG tablet TAKE ONE TABLET BY MOUTH TWICE DAILY  60 tablet  6  . Multiple Vitamin (MULTIVITAMIN) tablet Take 1 tablet by mouth daily.        . OMEGA 3 1000 MG CAPS Take 1,000 mg by mouth 2 (two) times daily.      Marland Kitchen omeprazole (PRILOSEC) 20 MG capsule Take 20 mg by mouth 2 (two) times daily.        . Pseudoephedrine-Ibuprofen (DRISTAN SINUS PO) Take by mouth as needed.      . rosuvastatin (CRESTOR) 20 MG tablet Take 1 tablet (20 mg total) by mouth at bedtime.  168 tablet  0    Past Medical History  Diagnosis Date  . CLL (chronic lymphoblastic leukemia)   . Hodgkin's disease   . Aortic stenosis   . Atrial fibrillation   . Coronary atherosclerosis of native coronary artery   . Hyperlipidemia   . Essential  hypertension, benign   . BPH (benign prostatic hypertrophy)   . Elevated WBC count   . Arthritis   . Acid reflux     Past Surgical History  Procedure Date  . Eye surgery   . 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 Aortic valve replacement 10/09    23mm Magna Pericardial   . Esophagogastroduodenoscopy     scrapping of throat and stretching  . Lithotripsy   . Bone marrow aspiration   . Portacath placement     Family History  Problem Relation Age of Onset  . Heart failure Mother   . Blindness Mother   . Cancer Father     Lung     Social History [name] reports that he has never smoked. He has never used smokeless tobacco. [name] reports that he does not drink alcohol.  Review of Systems No reported palpitations, no orthopnea. Stable appetite. No syncope. Otherwise negative.  Physical Examination Filed Vitals:   04/14/11 1414  BP: 111/69  Pulse: 71  Resp: 16   Morbidly obese male in no acute distress.  HEENT: Conjunctiva and lids normal, oropharynx with poor dentition.  Neck: Supple, no elevated jugular venous pressure or bruits.  Lungs: Clear to auscultation, nonlabored.  Cardiac: Regular rate and rhythm, S4, 2/6 systolic murmur at the base, no S3.  Abdomen: Obese, nontender, bowel sounds present.  Extremities: Venous stasis noted, 1-2+ edema, distal pulses diminished.  Skin: Warm and dry, scattered excoriations.  Musculoskeletal: No gross deformities.  Neuropsychiatric: Alert and oriented x3, affect grossly normal.   Problem List and Plan

## 2011-05-10 ENCOUNTER — Other Ambulatory Visit: Payer: Self-pay | Admitting: Family Medicine

## 2011-05-10 ENCOUNTER — Telehealth: Payer: Self-pay | Admitting: Family Medicine

## 2011-05-10 ENCOUNTER — Encounter: Payer: Self-pay | Admitting: Family Medicine

## 2011-05-10 ENCOUNTER — Ambulatory Visit (INDEPENDENT_AMBULATORY_CARE_PROVIDER_SITE_OTHER): Payer: Medicare Other | Admitting: Family Medicine

## 2011-05-10 ENCOUNTER — Encounter (HOSPITAL_COMMUNITY): Payer: Self-pay | Admitting: Emergency Medicine

## 2011-05-10 ENCOUNTER — Inpatient Hospital Stay: Admission: AD | Admit: 2011-05-10 | Payer: Medicare Other | Source: Ambulatory Visit | Admitting: Internal Medicine

## 2011-05-10 ENCOUNTER — Inpatient Hospital Stay (HOSPITAL_COMMUNITY)
Admission: EM | Admit: 2011-05-10 | Discharge: 2011-05-12 | DRG: 815 | Disposition: A | Discharge status: Home / Self Care | Payer: Medicare Other | Attending: Internal Medicine | Admitting: Internal Medicine

## 2011-05-10 VITALS — BP 132/74 | HR 87 | Temp 99.3°F | Resp 18 | Ht 70.0 in | Wt 253.0 lb

## 2011-05-10 DIAGNOSIS — K219 Gastro-esophageal reflux disease without esophagitis: Secondary | ICD-10-CM | POA: Diagnosis present

## 2011-05-10 DIAGNOSIS — E860 Dehydration: Secondary | ICD-10-CM | POA: Diagnosis present

## 2011-05-10 DIAGNOSIS — M129 Arthropathy, unspecified: Secondary | ICD-10-CM | POA: Diagnosis present

## 2011-05-10 DIAGNOSIS — B9789 Other viral agents as the cause of diseases classified elsewhere: Secondary | ICD-10-CM | POA: Diagnosis present

## 2011-05-10 DIAGNOSIS — R5383 Other fatigue: Secondary | ICD-10-CM

## 2011-05-10 DIAGNOSIS — D61818 Other pancytopenia: Secondary | ICD-10-CM | POA: Diagnosis present

## 2011-05-10 DIAGNOSIS — Z7982 Long term (current) use of aspirin: Secondary | ICD-10-CM

## 2011-05-10 DIAGNOSIS — B349 Viral infection, unspecified: Secondary | ICD-10-CM

## 2011-05-10 DIAGNOSIS — R5381 Other malaise: Secondary | ICD-10-CM

## 2011-05-10 DIAGNOSIS — Z79899 Other long term (current) drug therapy: Secondary | ICD-10-CM

## 2011-05-10 DIAGNOSIS — C9110 Chronic lymphocytic leukemia of B-cell type not having achieved remission: Secondary | ICD-10-CM | POA: Insufficient documentation

## 2011-05-10 DIAGNOSIS — I10 Essential (primary) hypertension: Secondary | ICD-10-CM | POA: Insufficient documentation

## 2011-05-10 DIAGNOSIS — E785 Hyperlipidemia, unspecified: Secondary | ICD-10-CM | POA: Diagnosis present

## 2011-05-10 DIAGNOSIS — Z954 Presence of other heart-valve replacement: Secondary | ICD-10-CM

## 2011-05-10 DIAGNOSIS — Z951 Presence of aortocoronary bypass graft: Secondary | ICD-10-CM

## 2011-05-10 DIAGNOSIS — I2510 Atherosclerotic heart disease of native coronary artery without angina pectoris: Secondary | ICD-10-CM | POA: Insufficient documentation

## 2011-05-10 DIAGNOSIS — I4891 Unspecified atrial fibrillation: Secondary | ICD-10-CM | POA: Insufficient documentation

## 2011-05-10 DIAGNOSIS — C8190 Hodgkin lymphoma, unspecified, unspecified site: Secondary | ICD-10-CM | POA: Insufficient documentation

## 2011-05-10 DIAGNOSIS — R509 Fever, unspecified: Secondary | ICD-10-CM | POA: Insufficient documentation

## 2011-05-10 DIAGNOSIS — D696 Thrombocytopenia, unspecified: Secondary | ICD-10-CM | POA: Diagnosis present

## 2011-05-10 DIAGNOSIS — D72829 Elevated white blood cell count, unspecified: Principal | ICD-10-CM | POA: Diagnosis present

## 2011-05-10 DIAGNOSIS — N400 Enlarged prostate without lower urinary tract symptoms: Secondary | ICD-10-CM | POA: Diagnosis present

## 2011-05-10 LAB — COMPREHENSIVE METABOLIC PANEL
ALT: 17 U/L (ref 0–53)
AST: 19 U/L (ref 0–37)
Alkaline Phosphatase: 83 U/L (ref 39–117)
Calcium: 9.1 mg/dL (ref 8.4–10.5)
Chloride: 103 mEq/L (ref 96–112)
Creat: 1.29 mg/dL (ref 0.50–1.35)
Total Bilirubin: 1.9 mg/dL — ABNORMAL HIGH (ref 0.3–1.2)

## 2011-05-10 LAB — CBC WITH DIFFERENTIAL/PLATELET
Basophils Absolute: 0 10*3/uL (ref 0.0–0.1)
Eosinophils Relative: 0 % (ref 0–5)
HCT: 33.4 % — ABNORMAL LOW (ref 39.0–52.0)
Hemoglobin: 11 g/dL — ABNORMAL LOW (ref 13.0–17.0)
Lymphocytes Relative: 91 % — ABNORMAL HIGH (ref 12–46)
Lymphs Abs: 71.5 10*3/uL — ABNORMAL HIGH (ref 0.7–4.0)
MCV: 106.4 fL — ABNORMAL HIGH (ref 78.0–100.0)
Monocytes Absolute: 0.4 10*3/uL (ref 0.1–1.0)
Neutro Abs: 4.7 10*3/uL (ref 1.7–7.7)
RBC: 3.14 MIL/uL — ABNORMAL LOW (ref 4.22–5.81)
RDW: 16 % — ABNORMAL HIGH (ref 11.5–15.5)
WBC: 78.6 10*3/uL (ref 4.0–10.5)

## 2011-05-10 MED ORDER — SODIUM CHLORIDE 0.9 % IV SOLN
INTRAVENOUS | Status: DC
  Administered 2011-05-10 – 2011-05-12 (×2): via INTRAVENOUS

## 2011-05-10 MED ORDER — OSELTAMIVIR PHOSPHATE 75 MG PO CAPS: 75.0000 mg | ORAL_CAPSULE | Freq: Two times a day (BID) | ORAL | Status: DC

## 2011-05-10 NOTE — Patient Instructions (Signed)
Do not pick up the flu medicine until I call you Get your blood work done, I will call you this evening with results If you have high fever, chest pain, or difficulty breathing go to the ER

## 2011-05-10 NOTE — Telephone Encounter (Signed)
I spoke with pt wife this pm, elevated WBC twice his baseline for CLL, along with fatigue he needs inpatient admission.  I spoke with admitting physician on call, he has accepted pt. Wife to send to [facility] for admission and work-up Dr. [name] to be called in the AM

## 2011-05-10 NOTE — ED Notes (Signed)
Wife states was called by Dr. Jeanice Lim and was told that patient has WBC of 78,000 and was told to come to ER.

## 2011-05-10 NOTE — ED Notes (Signed)
Pt referred here by Dr. Jeanice Lim d/t pt having WBC of 78,000 - pt w/ hx of leukemia. Pt admits to "not feeling well and mild cough" - unsure if pt has had a fever. Pt denies any pain at present, pt in no acute distress at present, resting comfortably in bed. Family at bedside x1.

## 2011-05-10 NOTE — Assessment & Plan Note (Signed)
Reviewed oncology notes, his WBC tend to be 20-30,000. Will obtain stat labs, if considerably above baseline will send for inpatient admission for work-up and oncology consult

## 2011-05-10 NOTE — H&P (Signed)
PCP:  [name], MD, MD Chief Complaint:  Fever and chills and this started today.  HPI:  Patient is a 66 year old Caucasian male with history of CLL, Hodgkin's lymphoma and coronary artery disease was sent to the hospital by her primary care physician because of markedly elevated WBC count. Patient however claimed that he developed fever and chills this morning. This was said to be associated with nausea but denied any vomiting. He denied any chest pain or shortness of breath. He denied any cough. He denied any abdominal discomfort. He denied any diarrhea or hematochezia. He denied any dysuria or hematuria. Fever was said to have persisted and subsequently went to see his primary care doctor. In his PCPs office, he was found to have markedly elevated WBC count, subsequently asked to come to the hospital for further evaluation  Review of Systems:  The patient denies anorexia, fever++, weight loss,, vision loss, decreased hearing, hoarseness, chest pain, syncope, dyspnea on exertion, peripheral edema+, balance deficits, hemoptysis, abdominal pain, melena, hematochezia, severe indigestion/heartburn, hematuria, incontinence, genital sores, muscle weakness, suspicious skin lesions, transient blindness, difficulty walking, depression, unusual weight change, abnormal bleeding, enlarged lymph nodes, angioedema, and breast masses.  Past Medical History:  Past Medical History  Diagnosis Date  . CLL (chronic lymphoblastic leukemia)   . Hodgkin's disease   . Aortic stenosis   . Atrial fibrillation   . Coronary atherosclerosis of native coronary artery   . Hyperlipidemia   . Essential hypertension, benign   . BPH (benign prostatic hypertrophy)   . Elevated WBC count   . Arthritis   . Acid reflux     Past Surgical History  Procedure Date  . Eye surgery   . 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 Aortic valve replacement 10/09    23mm Magna Pericardial   .  Esophagogastroduodenoscopy     scrapping of throat and stretching  . Lithotripsy   . Bone marrow aspiration   . Portacath placement   . Colon surgery     Medications:  Prior to Admission medications   Medication Sig Start Date End Date Taking? Authorizing Provider  acetaminophen (TYLENOL) 500 MG tablet Take 1,000 mg by mouth once as needed. For pain    Yes Historical Provider, MD  acyclovir (ZOVIRAX) 200 MG capsule Take 200 mg by mouth 2 (two) times daily. Take one tablet by mouth twice a day    Yes Historical Provider, MD  aspirin (ASPIRIN LOW DOSE) 81 MG EC tablet Take 81 mg by mouth daily. Take one tablet by mouth daily    Yes Historical Provider, MD  folic acid (FOLVITE) 1 MG tablet Take 1 mg by mouth daily. Take one tablet by mouth once a day    Yes Historical Provider, MD  metoprolol (LOPRESSOR) 50 MG tablet TAKE ONE TABLET BY MOUTH TWICE DAILY 01/29/11  Yes Jonelle Sidle, MD  Multiple Vitamins-Minerals (CENTRUM SILVER ULTRA MENS PO) Take 1 tablet by mouth daily.   Yes Historical Provider, MD  OMEGA 3 1000 MG CAPS Take 1,000 mg by mouth every morning.    Yes Historical Provider, MD  omeprazole (PRILOSEC) 20 MG capsule Take 20 mg by mouth every morning.    Yes Historical Provider, MD  oseltamivir (TAMIFLU) 75 MG capsule Take 1 capsule (75 mg total) by mouth 2 (two) times daily. 05/10/11 05/20/11 Yes Milinda Antis, MD  pseudoephedrine-acetaminophen (TYLENOL SINUS) 30-500 MG TABS Take 2 tablets by mouth once as needed. For sinus congestion  Yes Historical Provider, MD  rosuvastatin (CRESTOR) 20 MG tablet Take 1 tablet (20 mg total) by mouth at bedtime. 12/18/10 12/18/11 Yes [name], MD    Allergies:  No Known Allergies  Social History:   reports that he has never smoked. He has never used smokeless tobacco. He reports that he does not drink alcohol or use illicit drugs.  Family History:  Family History  Problem Relation Age of Onset  . Heart failure Mother   .  Blindness Mother   . Cancer Father     Lung     Physical Exam:  Filed Vitals:   05/10/11 2124  BP: 121/67  Pulse: 77  Temp: 98.2 F (36.8 C)  TempSrc: Oral  Resp: 18  Height: 5\' 10"  (1.778 m)  Weight: 114.76 kg (253 lb)  SpO2: 100%      General: Alert and oriented times three, not in any acute distress, dehydrated  Eyes: PERRLA, pale conjunctiva, scleral anicterus  ENT: Dry oral mucosa, neck supple, no thyromegaly  Lungs: clear to ascultation, no wheeze, no crackles, no use of accessory muscles  Cardiovascular: regular rate and rhythm, no regurgitation, no gallops, no murmurs. No carotid bruits, no JVD  Abdomen: soft with multiple scratch marks, positive BS, non-tender, non-distended, no organomegaly, not an acute abdomen  GU: not examined  Neuro: No lateralizing signs  Musculoskeletal: strength 5/5 all extremities, no clubbing, cyanosis or +1 pedal edema  Skin: Decreased turgor  Psych: appropriate affect  ?  Labs on Admission:   [facility] 05/10/11 1541  NA 140  K 4.3  CL 103  CO2 27  GLUCOSE 113*  BUN 20  CREATININE 1.29  CALCIUM 9.1  MG --  PHOS --     Basename 05/10/11 1541  AST 19  ALT 17  ALKPHOS 83  BILITOT 1.9*  PROT 6.0  ALBUMIN 3.9    No results found for this basename: LIPASE:2,AMYLASE:2 in the last 72 hours   Basename 05/10/11 1541  WBC 78.6*  NEUTROABS 4.7  HGB 11.0*  HCT 33.4*  MCV 106.4*  PLT 72*    No results found for this basename: CKTOTAL:3,CKMB:3,CKMBINDEX:3,TROPONINI:3 in the last 72 hours  No results found for this basename: TSH,T4TOTAL,FREET3,T3FREE,THYROIDAB in the last 72 hours  No results found for this basename: VITAMINB12:2,FOLATE:2,FERRITIN:2,TIBC:2,IRON:2,RETICCTPCT:2 in the last 72 hours  Radiological Exams on Admission:  No results found.  Assessment/Plan  Present on Admission:   Problems: #1 fever #2 chills #3 dehydration #4 pancytopenia  Impression: #1 fever-unknown origin,  questionable sepsis #2 dehydration #3 pancytopenia #4 history of CLL. #5 history of Hodgkin's lymphoma #6 coronary artery disease #7 history of atrial fibrillation #8 hyperlipidemia #9 GERD #10 hypertension.  Plan: #1 admit patient to general medical floor #2 rehydrate patient with IV normal saline #3 start treatment for sepsis with IV Zosyn and vancomycin #4 restart home meds #5 GI prophylaxis with Protonix and DVT prophylaxis with TED hose #6 labs; blood culture x2, CBC CMP and magnesium repeated in a.m. Patient will be evaluated on daily basis.             Talmage Nap                     [phone]

## 2011-05-10 NOTE — Telephone Encounter (Signed)
Seen in office today  

## 2011-05-10 NOTE — Progress Notes (Signed)
  Subjective:    Patient ID: [name], male    DOB: [birth year], 66 y.o.   MRN: [id number]  HPI  Fatigue and muscle aches since this AM. Felt well over the weekend, work up very tired, like all he can do is sleep. Denies fever but has chills, denies N/V, cough , CP or SOB. Admits to scratchy throat but otherwise no other specific complaint.  +sick contact with family member.   Review of Systems   GEN- +fatigue,denies fever, +chills on and off, weight loss,weakness, denies recent illness HEENT- denies eye drainage, change in vision, nasal discharge,+itchy throat CVS- denies chest pain, palpitations RESP- denies SOB, cough, wheeze ABD- denies N/V, denies diarrhea, abd pain GU- denies dysuria, hematuria,  MSK- denies joint pain,+ muscle aches, injury Neuro- +headache,denies dizziness, syncope, seizure activity      Objective:   Physical Exam GEN- NAD, alert and oriented x3, fatigued appearing, low grade temp HEENT- PERRL, EOMI, non injected sclera, MMM, oropharynx clear, nares clear no rhinorrhea TM Clear bilat, no effusion noted Neck- Supple,  Nodes- no cervical nodes CVS- RRR, no murmur RESP-mild dry basilar crackles bilat, normal WOB, no wheeze ABD- NABS, soft, NT, ND EXT- No edema Pulses- Radial, DP- 2+        Assessment & Plan:   Viral syndrome- pt with symptoms less than 24 hours but immunocompromised. Will start tamiflu if CBC and CMET at baseline. Given red flags. At this point he has not declared any bacterial infection.

## 2011-05-11 ENCOUNTER — Encounter (HOSPITAL_COMMUNITY): Payer: Self-pay | Admitting: *Deleted

## 2011-05-11 ENCOUNTER — Inpatient Hospital Stay (HOSPITAL_COMMUNITY): Payer: Medicare Other

## 2011-05-11 LAB — URINALYSIS, ROUTINE W REFLEX MICROSCOPIC
Bilirubin Urine: NEGATIVE
Hgb urine dipstick: NEGATIVE
Nitrite: NEGATIVE
Protein, ur: NEGATIVE mg/dL
Specific Gravity, Urine: <1.005 — ABNORMAL LOW (ref 1.005–1.030)
Urobilinogen, UA: 0.2 mg/dL (ref 0.0–1.0)

## 2011-05-11 LAB — DIFFERENTIAL
Band Neutrophils: 0 % (ref 0–10)
Basophils Absolute: 0 10*3/uL (ref 0.0–0.1)
Basophils Relative: 0 % (ref 0–1)
Lymphocytes Relative: 90 % — ABNORMAL HIGH (ref 12–46)
Lymphs Abs: 41.9 10*3/uL — ABNORMAL HIGH (ref 0.7–4.0)
Monocytes Absolute: 0 10*3/uL — ABNORMAL LOW (ref 0.1–1.0)
Monocytes Relative: 0 % — ABNORMAL LOW (ref 3–12)
Neutro Abs: 4.7 10*3/uL (ref 1.7–7.7)
Neutrophils Relative %: 10 % — ABNORMAL LOW (ref 43–77)

## 2011-05-11 LAB — COMPREHENSIVE METABOLIC PANEL
Albumin: 3.4 g/dL — ABNORMAL LOW (ref 3.5–5.2)
Alkaline Phosphatase: 68 U/L (ref 39–117)
BUN: 17 mg/dL (ref 6–23)
Chloride: 106 mEq/L (ref 96–112)
Creatinine, Ser: 1.13 mg/dL (ref 0.50–1.35)
GFR calc Af Amer: 76 mL/min — ABNORMAL LOW (ref >90)
Glucose, Bld: 122 mg/dL — ABNORMAL HIGH (ref 70–99)
Total Bilirubin: 0.7 mg/dL (ref 0.3–1.2)
Total Protein: 5.4 g/dL — ABNORMAL LOW (ref 6.0–8.3)

## 2011-05-11 LAB — CBC
HCT: 29.4 % — ABNORMAL LOW (ref 39.0–52.0)
Hemoglobin: 9.8 g/dL — ABNORMAL LOW (ref 13.0–17.0)
RBC: 2.81 MIL/uL — ABNORMAL LOW (ref 4.22–5.81)
WBC: 46.6 10*3/uL — ABNORMAL HIGH (ref 4.0–10.5)

## 2011-05-11 LAB — MRSA PCR SCREENING: MRSA by PCR: NEGATIVE

## 2011-05-11 LAB — MAGNESIUM: Magnesium: 2.2 mg/dL (ref 1.5–2.5)

## 2011-05-11 MED ORDER — PANTOPRAZOLE SODIUM 40 MG IV SOLR
40.0000 mg | INTRAVENOUS | Status: DC
  Administered 2011-05-11 – 2011-05-12 (×2): 40 mg via INTRAVENOUS
  Filled 2011-05-11 (×2): qty 40

## 2011-05-11 MED ORDER — FOLIC ACID 1 MG PO TABS
1.0000 mg | ORAL_TABLET | Freq: Every day | ORAL | Status: DC
  Administered 2011-05-11 – 2011-05-12 (×2): 1 mg via ORAL
  Filled 2011-05-11 (×2): qty 1

## 2011-05-11 MED ORDER — OSELTAMIVIR PHOSPHATE 75 MG PO CAPS
75.0000 mg | ORAL_CAPSULE | Freq: Two times a day (BID) | ORAL | Status: DC
  Administered 2011-05-11 – 2011-05-12 (×4): 75 mg via ORAL
  Filled 2011-05-11 (×4): qty 1

## 2011-05-11 MED ORDER — OMEGA 3 1000 MG PO CAPS: 1000.0000 mg | ORAL_CAPSULE | ORAL | Status: DC

## 2011-05-11 MED ORDER — ADULT MULTIVITAMIN W/MINERALS CH
1.0000 | ORAL_TABLET | Freq: Every day | ORAL | Status: DC
  Administered 2011-05-11 – 2011-05-12 (×2): 1 via ORAL
  Filled 2011-05-11 (×2): qty 1

## 2011-05-11 MED ORDER — OMEGA-3-ACID ETHYL ESTERS 1 G PO CAPS
1.0000 g | ORAL_CAPSULE | Freq: Every day | ORAL | Status: DC
  Administered 2011-05-11 – 2011-05-12 (×2): 1 g via ORAL
  Filled 2011-05-11 (×2): qty 1

## 2011-05-11 MED ORDER — ACETAMINOPHEN 500 MG PO TABS: 1000.0000 mg | ORAL_TABLET | ORAL | Status: DC | PRN

## 2011-05-11 MED ORDER — METOPROLOL TARTRATE 50 MG PO TABS
50.0000 mg | ORAL_TABLET | Freq: Two times a day (BID) | ORAL | Status: DC
  Administered 2011-05-11 – 2011-05-12 (×4): 50 mg via ORAL
  Filled 2011-05-11 (×4): qty 1

## 2011-05-11 MED ORDER — ACYCLOVIR 200 MG PO CAPS
ORAL_CAPSULE | ORAL | Status: AC
  Filled 2011-05-11: qty 1

## 2011-05-11 MED ORDER — CENTRUM SILVER ULTRA MENS PO TABS: 1.0000 | ORAL_TABLET | Freq: Every day | ORAL | Status: DC

## 2011-05-11 MED ORDER — ACYCLOVIR 200 MG PO CAPS
200.0000 mg | ORAL_CAPSULE | Freq: Two times a day (BID) | ORAL | Status: DC
Start: 2011-05-11 — End: 2011-05-12
  Administered 2011-05-11 – 2011-05-12 (×4): 200 mg via ORAL
  Filled 2011-05-11 (×11): qty 1

## 2011-05-11 MED ORDER — VANCOMYCIN HCL IN DEXTROSE 1-5 GM/200ML-% IV SOLN
1000.0000 mg | Freq: Once | INTRAVENOUS | Status: AC
  Administered 2011-05-11: 1000 mg via INTRAVENOUS
  Filled 2011-05-11: qty 200

## 2011-05-11 MED ORDER — ACETAMINOPHEN 500 MG PO TABS: 1000.0000 mg | ORAL_TABLET | Freq: Once | ORAL | Status: AC | PRN

## 2011-05-11 MED ORDER — VANCOMYCIN HCL IN DEXTROSE 1-5 GM/200ML-% IV SOLN
INTRAVENOUS | Status: AC
  Filled 2011-05-11: qty 200

## 2011-05-11 MED ORDER — PSEUDOEPHEDRINE HCL 60 MG PO TABS
60.0000 mg | ORAL_TABLET | ORAL | Status: DC | PRN
  Filled 2011-05-11: qty 2

## 2011-05-11 MED ORDER — SODIUM CHLORIDE 0.9 % IV SOLN
1250.0000 mg | Freq: Two times a day (BID) | INTRAVENOUS | Status: DC
  Administered 2011-05-11 – 2011-05-12 (×2): 1250 mg via INTRAVENOUS
  Filled 2011-05-11 (×4): qty 1250

## 2011-05-11 MED ORDER — PSEUDOEPHEDRINE-ACETAMINOPHEN 30-500 MG PO TABS: 2.0000 | ORAL_TABLET | ORAL | Status: DC | PRN

## 2011-05-11 MED ORDER — ROSUVASTATIN CALCIUM 20 MG PO TABS
20.0000 mg | ORAL_TABLET | Freq: Every day | ORAL | Status: DC
  Administered 2011-05-11: 20 mg via ORAL
  Filled 2011-05-11: qty 1

## 2011-05-11 MED ORDER — SODIUM CHLORIDE 0.9 % IJ SOLN
INTRAMUSCULAR | Status: AC
  Administered 2011-05-11: 10:00:00
  Filled 2011-05-11: qty 3

## 2011-05-11 MED ORDER — PIPERACILLIN-TAZOBACTAM 3.375 G IVPB
3.3750 g | Freq: Three times a day (TID) | INTRAVENOUS | Status: DC
  Administered 2011-05-11 – 2011-05-12 (×4): 3.375 g via INTRAVENOUS
  Filled 2011-05-11 (×9): qty 50

## 2011-05-11 NOTE — Plan of Care (Signed)
Problem: Consults Goal: Skin Care Protocol Initiated - if indicated If consults are not indicated, leave blank or document N/A Outcome: Not Progressing Pt was sent by Dr Jeanice Lim R/T [facility] of 78.6 Antibiotics zosyn & vancomycin Rx'd

## 2011-05-11 NOTE — Consult Note (Signed)
ANTIBIOTIC CONSULT NOTE - INITIAL  Pharmacy Consult for Vancomycin Indication: rule out sepsis  No Known Allergies  Patient Measurements: Height: 5\' 10"  (177.8 cm) Weight: 248 lb 10.9 oz (112.8 kg) IBW/kg (Calculated) : 73   Vital Signs: Temp: 97.5 F (36.4 C) (02/12 0444) Temp src: Oral (02/12 0444) BP: 132/77 mmHg (02/12 0444) Pulse Rate: 70  (02/12 0444) Intake/Output from previous day: 02/11 0701 - 02/12 0700 In: 250 [IV Piggyback:250] Out: -  Intake/Output from this shift:    Labs:  [facility] 05/11/11 0519 05/10/11 1541  WBC 46.6* 78.6*  HGB 9.8* 11.0*  PLT 53* 72*  LABCREA -- --  CREATININE 1.13 1.29   Estimated Creatinine Clearance: 79.8 ml/min (by C-G formula based on Cr of 1.13). No results found for this basename: VANCOTROUGH:2,VANCOPEAK:2,VANCORANDOM:2,GENTTROUGH:2,GENTPEAK:2,GENTRANDOM:2,TOBRATROUGH:2,TOBRAPEAK:2,TOBRARND:2,AMIKACINPEAK:2,AMIKACINTROU:2,AMIKACIN:2, in the last 72 hours   Microbiology: Recent Results (from the past 720 hour(s))  CULTURE, BLOOD (ROUTINE X 2)     Status: Normal (Preliminary result)   Collection Time   05/11/11 12:16 AM      Component Value Range Status Comment   Specimen Description PORTA CATH RIGHT DRAWN BY RN   Final    Special Requests     Final    Value: BOTTLES DRAWN AEROBIC AND ANAEROBIC 8CC EACH BOTTLE   Culture PENDING   Incomplete    Report Status PENDING   Incomplete   CULTURE, BLOOD (ROUTINE X 2)     Status: Normal (Preliminary result)   Collection Time   05/11/11 12:22 AM      Component Value Range Status Comment   Specimen Description BLOOD LEFT HAND   Final    Special Requests     Final    Value: BOTTLES DRAWN AEROBIC AND ANAEROBIC 6CC EACH BOTTLE   Culture PENDING   Incomplete    Report Status PENDING   Incomplete    Medical History: Past Medical History  Diagnosis Date  . CLL (chronic lymphoblastic leukemia)   . Hodgkin's disease   . Aortic stenosis   . Atrial fibrillation   . Coronary  atherosclerosis of native coronary artery   . Hyperlipidemia   . Essential hypertension, benign   . BPH (benign prostatic hypertrophy)   . Elevated WBC count   . Arthritis   . Acid reflux    Medications:  Scheduled:    . acyclovir  200 mg Oral BID  . folic acid  1 mg Oral Daily  . metoprolol  50 mg Oral BID  . mulitivitamin with minerals  1 tablet Oral Daily  . omega-3 acid ethyl esters  1 g Oral Daily  . oseltamivir  75 mg Oral BID  . pantoprazole (PROTONIX) IV  40 mg Intravenous Q24H  . piperacillin-tazobactam (ZOSYN)  IV  3.375 g Intravenous Q8H  . rosuvastatin  20 mg Oral QHS  . vancomycin  1,250 mg Intravenous Q12H  . vancomycin  1,000 mg Intravenous Once  . DISCONTD: CENTRUM SILVER ULTRA MENS  1 tablet Mouth/Throat Daily  . DISCONTD: OMEGA 3  1,000 mg Oral BH-q7a   Assessment: Obesity Good renal fxn  Goal of Therapy:  Vancomycin trough level 15-20 mcg/ml Eradicate infection.  Plan: Vancomycin 1250mg  iv q12hrs Check trough at steady state Labs per protocol  Valrie Hart A 05/11/2011,7:54 AM

## 2011-05-11 NOTE — Progress Notes (Signed)
Subjective: Has a mild cough, no abd pain, no vomiting, no diarrhea, no dysuria  Objective: Vital signs in last 24 hours: Temp:  [97.5 F (36.4 C)-99.3 F (37.4 C)] 98.2 F (36.8 C) (02/12 0800) Pulse Rate:  [53-87] 70  (02/12 0800) Resp:  [16-22] 22  (02/12 0800) BP: (103-132)/(50-77) 132/77 mmHg (02/12 0444) SpO2:  [96 %-100 %] 98 % (02/12 0800) Weight:  [112.8 kg (248 lb 10.9 oz)-114.76 kg (253 lb)] 112.8 kg (248 lb 10.9 oz) (02/12 0444) Weight change:  Last BM Date: 05/10/11  Intake/Output from previous day: 02/11 0701 - 02/12 0700 In: 250 [IV Piggyback:250] Out: -  Total I/O In: 360 [P.O.:360] Out: -    Physical Exam: General: Alert, awake, oriented x3, in no acute distress. HEENT: No bruits, no goiter. Heart: Regular rate and rhythm, without murmurs, rubs, gallops. Lungs: Clear to auscultation bilaterally. Abdomen: Soft, nontender, nondistended, positive bowel sounds. Extremities: No clubbing cyanosis or edema with positive pedal pulses. Neuro: Grossly intact, nonfocal.    Lab Results: Basic Metabolic Panel:  Basename 05/11/11 0519 05/10/11 1541  NA 140 140  K 3.6 4.3  CL 106 103  CO2 24 27  GLUCOSE 122* 113*  BUN 17 20  CREATININE 1.13 1.29  CALCIUM 8.6 9.1  MG 2.2 --  PHOS -- --   Liver Function Tests:  Basename 05/11/11 0519 05/10/11 1541  AST 17 19  ALT 15 17  ALKPHOS 68 83  BILITOT 0.7 1.9*  PROT 5.4* 6.0  ALBUMIN 3.4* 3.9   No results found for this basename: LIPASE:2,AMYLASE:2 in the last 72 hours No results found for this basename: AMMONIA:2 in the last 72 hours CBC:  Basename 05/11/11 0519 05/10/11 1541  WBC 46.6* 78.6*  NEUTROABS 4.7 4.7  HGB 9.8* 11.0*  HCT 29.4* 33.4*  MCV 104.6* 106.4*  PLT 53* 72*   Cardiac Enzymes: No results found for this basename: CKTOTAL:3,CKMB:3,CKMBINDEX:3,TROPONINI:3 in the last 72 hours BNP: No results found for this basename: PROBNP:3 in the last 72 hours D-Dimer: No results found for this  basename: DDIMER:2 in the last 72 hours CBG: No results found for this basename: GLUCAP:6 in the last 72 hours Hemoglobin A1C: No results found for this basename: HGBA1C in the last 72 hours Fasting Lipid Panel: No results found for this basename: CHOL,HDL,LDLCALC,TRIG,CHOLHDL,LDLDIRECT in the last 72 hours Thyroid Function Tests: No results found for this basename: TSH,T4TOTAL,FREET4,T3FREE,THYROIDAB in the last 72 hours Anemia Panel: No results found for this basename: VITAMINB12,FOLATE,FERRITIN,TIBC,IRON,RETICCTPCT in the last 72 hours Coagulation: No results found for this basename: LABPROT:2,INR:2 in the last 72 hours Urine Drug Screen: Drugs of Abuse  No results found for this basename: labopia, cocainscrnur, labbenz, amphetmu, thcu, labbarb    Alcohol Level: No results found for this basename: ETH:2 in the last 72 hours Urinalysis: No results found for this basename: COLORURINE:2,APPERANCEUR:2,LABSPEC:2,PHURINE:2,GLUCOSEU:2,HGBUR:2,BILIRUBINUR:2,KETONESUR:2,PROTEINUR:2,UROBILINOGEN:2,NITRITE:2,LEUKOCYTESUR:2 in the last 72 hours  Recent Results (from the past 240 hour(s))  CULTURE, BLOOD (ROUTINE X 2)     Status: Normal (Preliminary result)   Collection Time   05/11/11 12:16 AM      Component Value Range Status Comment   Specimen Description PORTA CATH RIGHT DRAWN BY RN   Final    Special Requests     Final    Value: BOTTLES DRAWN AEROBIC AND ANAEROBIC 8CC EACH BOTTLE   Culture PENDING   Incomplete    Report Status PENDING   Incomplete   CULTURE, BLOOD (ROUTINE X 2)     Status: Normal (Preliminary result)  Collection Time   05/11/11 12:22 AM      Component Value Range Status Comment   Specimen Description BLOOD LEFT HAND   Final    Special Requests     Final    Value: BOTTLES DRAWN AEROBIC AND ANAEROBIC 6CC EACH BOTTLE   Culture PENDING   Incomplete    Report Status PENDING   Incomplete   MRSA PCR SCREENING     Status: Normal   Collection Time   05/11/11  4:42 AM       Component Value Range Status Comment   MRSA by PCR NEGATIVE  NEGATIVE  Final     Studies/Results: No results found.  Medications: Scheduled Meds:   . acyclovir  200 mg Oral BID  . folic acid  1 mg Oral Daily  . metoprolol  50 mg Oral BID  . mulitivitamin with minerals  1 tablet Oral Daily  . omega-3 acid ethyl esters  1 g Oral Daily  . oseltamivir  75 mg Oral BID  . pantoprazole (PROTONIX) IV  40 mg Intravenous Q24H  . piperacillin-tazobactam (ZOSYN)  IV  3.375 g Intravenous Q8H  . rosuvastatin  20 mg Oral QHS  . vancomycin  1,250 mg Intravenous Q12H  . vancomycin  1,000 mg Intravenous Once  . DISCONTD: CENTRUM SILVER ULTRA MENS  1 tablet Mouth/Throat Daily  . DISCONTD: OMEGA 3  1,000 mg Oral BH-q7a   Continuous Infusions:   . sodium chloride 100 mL/hr at 05/10/11 2357   PRN Meds:.acetaminophen, acetaminophen, pseudoephedrine, DISCONTD: pseudoephedrine-acetaminophen  Assessment/Plan:  Active Problems:  HODGKIN'S DISEASE  CLL  CORONARY ATHEROSCLEROSIS NATIVE CORONARY ARTERY  Atrial fibrillation  FEVER UNSPECIFIED  Essential hypertension, benign  Plan:  Patient clinically appears stable.  No recurrence of fever overnight.  Continue antibiotics and follow up cultures.  Also check cxr and urinalysis.  Patient was started on tamilflu as an outpatient, ?underlying viral process  Dehydration is improved with IVF  Leukocytosis/Thrombocytopenia approaching baselines, follow up with Dr. [name] as scheduled.   LOS: 1 day   Shaquille Janes [facility] Pager: [phone] 05/11/2011, 8:35 AM

## 2011-05-11 NOTE — ED Notes (Signed)
Right chest portacath accessed with power port needle using sterile technique.  Waste blood of 10cc drawn from site then additional 10 cc drawn and used for blood culture x 1.  Flushes w/o difficulty with NS 10 ml; biocclusive dressing placed; NS 159ml/h infusing thru site.

## 2011-05-12 LAB — CBC
HCT: 31 % — ABNORMAL LOW (ref 39.0–52.0)
MCH: 35.7 pg — ABNORMAL HIGH (ref 26.0–34.0)
MCV: 105.4 fL — ABNORMAL HIGH (ref 78.0–100.0)
Platelets: 51 10*3/uL — ABNORMAL LOW (ref 150–400)
RBC: 2.94 MIL/uL — ABNORMAL LOW (ref 4.22–5.81)
RDW: 15.5 % (ref 11.5–15.5)

## 2011-05-12 LAB — BASIC METABOLIC PANEL
Calcium: 8.8 mg/dL (ref 8.4–10.5)
Creatinine, Ser: 1.12 mg/dL (ref 0.50–1.35)
GFR calc Af Amer: 77 mL/min — ABNORMAL LOW (ref >90)
GFR calc non Af Amer: 66 mL/min — ABNORMAL LOW (ref >90)
Sodium: 139 mEq/L (ref 135–145)

## 2011-05-12 MED ORDER — HEPARIN SOD (PORK) LOCK FLUSH 100 UNIT/ML IV SOLN
500.0000 [IU] | INTRAVENOUS | Status: DC
  Filled 2011-05-12: qty 5

## 2011-05-12 MED ORDER — HEPARIN SOD (PORK) LOCK FLUSH 100 UNIT/ML IV SOLN: 500.0000 [IU] | INTRAVENOUS | Status: DC | PRN

## 2011-05-12 MED ORDER — PANTOPRAZOLE SODIUM 40 MG PO TBEC: 40.0000 mg | DELAYED_RELEASE_TABLET | Freq: Every day | ORAL | Status: DC

## 2011-05-12 NOTE — Discharge Summary (Signed)
Physician Discharge Summary  Patient ID: [name] MRN: [id number] DOB/AGE: 68-Jun-1947 68 y.o.  Admit date: 05/10/2011 Discharge date: 05/12/2011  Primary Care Physician:  [name], MD, MD   Discharge Diagnoses:    Active Problems:  HODGKIN'S DISEASE  CLL  CORONARY ATHEROSCLEROSIS NATIVE CORONARY ARTERY  Atrial fibrillation  FEVER UNSPECIFIED, likely secondary to viral syndrome  Essential hypertension, benign    Medication List  As of 05/12/2011 11:16 AM   STOP taking these medications         oseltamivir 75 MG capsule      pseudoephedrine-acetaminophen 30-500 MG Tabs         TAKE these medications         acetaminophen 500 MG tablet   Commonly known as: TYLENOL   Take 1,000 mg by mouth once as needed. For pain      acyclovir 200 MG capsule   Commonly known as: ZOVIRAX   Take 200 mg by mouth 2 (two) times daily. Take one tablet by mouth twice a day      ASPIRIN LOW DOSE 81 MG EC tablet   Generic drug: aspirin   Take 81 mg by mouth daily. Take one tablet by mouth daily      CENTRUM SILVER ULTRA MENS PO   Take 1 tablet by mouth daily.      folic acid 1 MG tablet   Commonly known as: FOLVITE   Take 1 mg by mouth daily. Take one tablet by mouth once a day      metoprolol 50 MG tablet   Commonly known as: LOPRESSOR   TAKE ONE TABLET BY MOUTH TWICE DAILY      OMEGA 3 1000 MG Caps   Take 1,000 mg by mouth every morning.      omeprazole 20 MG capsule   Commonly known as: PRILOSEC   Take 20 mg by mouth every morning.      rosuvastatin 20 MG tablet   Commonly known as: CRESTOR   Take 1 tablet (20 mg total) by mouth at bedtime.           Discharge Exam: Blood pressure 130/79, pulse 63, temperature 97 F (36.1 C), temperature source Oral, resp. rate 16, height 5\' 10"  (1.778 m), weight 112.8 kg (248 lb 10.9 oz), SpO2 94.00%. NAD CTA B S1, S2, RRR Soft, NT, BS+ No edema  Disposition and Follow-up:  Follow up with Dr. Lodema Hong in 2  weeks Follow up with Dr. [name] as scheduled  Consults: none   Significant Diagnostic Studies:  Dg Chest 2 View  05/11/2011  *RADIOLOGY REPORT*  Clinical Data: CLL, fever, leukocytosis, cough, hypertension, prior heart surgery and Hodgkin's disease  CHEST - 2 VIEW  Comparison: 11/19/2010  Findings: Right subclavian Port-A-Cath, tip projecting over SVC. Upper normal-sized cardiac silhouette post CABG and AVR. Atherosclerotic calcification aortic arch. Mediastinal contours and pulmonary vascularity normal. Emphysematous and bronchitic changes. No pulmonary infiltrate, pleural effusion, or pneumothorax. Bones demineralized.  IMPRESSION: No acute abnormalities.  Original Report Authenticated By: Lollie Marrow, M.D.    Brief H and P: For complete details please refer to admission H and P, but in brief Patient is a 66 year old Caucasian male with history of CLL, Hodgkin's lymphoma and coronary artery disease was sent to the hospital by her primary care physician because of markedly elevated WBC count. Patient however claimed that he developed fever and chills this morning. This was said to be associated with nausea but denied any vomiting. He denied any  chest pain or shortness of breath. He denied any cough. He denied any abdominal discomfort. He denied any diarrhea or hematochezia. He denied any dysuria or hematuria. Fever was said to have persisted and subsequently went to see his primary care doctor. In his PCPs office, he was found to have markedly elevated WBC count, subsequently asked to come to the hospital for further evaluation     Hospital Course:  Patient was admitted to the hospital with fever/chills and leukocytosis worse than his baseline.  He was started on empiric antibiotics with vancomycin and zosyn.  Infectious work up has been unrevealing.  His blood cultures have shown no growth, urinalysis is negative and cxr shows no infiltrates.  Clinically, the patient does not appear toxic  and actually appears well.  He has not had a fever in almost 48 hours.  He has been rehydrated and his wbc count is returning to near his baseline range.  I have discussed the case with the patient's oncology team, and it is agreed that patient is appropriate to discharge home and follow up with oncology as scheduled.  It is possible that his fever was secondary to a viral syndrome.  In any case, he is clinically improved and is felt safe to discharge home  The remainder of his chronic medical issues have remained stable.  Time spent on Discharge:  Signed: Chavez Rosol [facility] Pager: [phone] 05/12/2011, 11:16 AM

## 2011-05-12 NOTE — Discharge Instructions (Signed)
Viral Infections A viral infection can be caused by different types of viruses.Most viral infections are not serious and resolve on their own. However, some infections may cause severe symptoms and may lead to further complications. SYMPTOMS Viruses can frequently cause:  Minor sore throat.   Aches and pains.   Headaches.   Runny nose.   Different types of rashes.   Watery eyes.   Tiredness.   Cough.   Loss of appetite.   Gastrointestinal infections, resulting in nausea, vomiting, and diarrhea.  These symptoms do not respond to antibiotics because the infection is not caused by bacteria. However, you might catch a bacterial infection following the viral infection. This is sometimes called a "superinfection." Symptoms of such a bacterial infection may include:  Worsening sore throat with pus and difficulty swallowing.   Swollen neck glands.   Chills and a high or persistent fever.   Severe headache.   Tenderness over the sinuses.   Persistent overall ill feeling (malaise), muscle aches, and tiredness (fatigue).   Persistent cough.   Yellow, green, or brown mucus production with coughing.  HOME CARE INSTRUCTIONS   Only take over-the-counter or prescription medicines for pain, discomfort, diarrhea, or fever as directed by your caregiver.   Drink enough water and fluids to keep your urine clear or pale yellow. Sports drinks can provide valuable electrolytes, sugars, and hydration.   Get plenty of rest and maintain proper nutrition. Soups and broths with crackers or rice are fine.  SEEK IMMEDIATE MEDICAL CARE IF:   You have severe headaches, shortness of breath, chest pain, neck pain, or an unusual rash.   You have uncontrolled vomiting, diarrhea, or you are unable to keep down fluids.   You or your child has an oral temperature above 102 F (38.9 C), not controlled by medicine.   Your baby is older than 3 months with a rectal temperature of 102 F (38.9 C) or  higher.   Your baby is 3 months old or younger with a rectal temperature of 100.4 F (38 C) or higher.  MAKE SURE YOU:   Understand these instructions.   Will watch your condition.   Will get help right away if you are not doing well or get worse.  Document Released: 12/23/2004 Document Revised: 11/25/2010 Document Reviewed: 07/20/2010 [employer] Patient Information 2012 [employer]. 

## 2011-05-12 NOTE — Progress Notes (Signed)
Port unaccessed, flushed with heparin per protocol. Site WNL.  Pt given d/c instructions, teachback completed.  Discussed home care with patient and discussed home medications, patient verbalizes understanding. F/U appointments in place (Dr Lodema Hong and Dr [name]) pt states they will keep appointments. Pt is stable at this time. Pt taken to main entrance in wheelchair by staff member.

## 2011-05-12 NOTE — Progress Notes (Signed)
The patient is receiving Protonix by the intravenous route.  Based on criteria approved by the Pharmacy and Therapeutics Committee and the Medical Executive Committee, the medication is being converted to the equivalent oral dose form.  These criteria include: -No Active GI bleeding -Able to tolerate diet of full liquids (or better) or tube feeding -Able to tolerate other medications by the oral or enteral route  If you have any questions about this conversion, please contact the Pharmacy Department (ext [phone]).  Thank you.  S. Anahi Belmar, PharmD  

## 2011-05-16 LAB — CULTURE, BLOOD (ROUTINE X 2)

## 2011-05-18 NOTE — Progress Notes (Signed)
UR Chart Review Completed  

## 2011-05-19 ENCOUNTER — Ambulatory Visit (INDEPENDENT_AMBULATORY_CARE_PROVIDER_SITE_OTHER): Payer: Medicare Other | Admitting: Family Medicine

## 2011-05-19 ENCOUNTER — Encounter: Payer: Self-pay | Admitting: Family Medicine

## 2011-05-19 VITALS — BP 98/62 | HR 71 | Temp 97.9°F | Resp 15 | Ht 70.0 in | Wt 252.8 lb

## 2011-05-19 DIAGNOSIS — I10 Essential (primary) hypertension: Secondary | ICD-10-CM

## 2011-05-19 DIAGNOSIS — C9110 Chronic lymphocytic leukemia of B-cell type not having achieved remission: Secondary | ICD-10-CM

## 2011-05-19 DIAGNOSIS — I4891 Unspecified atrial fibrillation: Secondary | ICD-10-CM

## 2011-05-19 DIAGNOSIS — K219 Gastro-esophageal reflux disease without esophagitis: Secondary | ICD-10-CM

## 2011-05-19 DIAGNOSIS — E785 Hyperlipidemia, unspecified: Secondary | ICD-10-CM

## 2011-05-19 NOTE — Assessment & Plan Note (Signed)
Recent hospitalization with marked elevation of WBC, has appt wioth onc this week, rept cbc and diff tooday

## 2011-05-19 NOTE — Patient Instructions (Addendum)
F/u in 4.5 month  Cbc and diff stat today, copy to Dr [name].  Fasting lipid and hepatic  And tsh in 4 .5 month  Limit your exercise to home and neighborhood now, until you feel less tired, please.  Ensure you keep appt with Oncology as hospital f/u please

## 2011-05-20 ENCOUNTER — Other Ambulatory Visit: Payer: Self-pay | Admitting: *Deleted

## 2011-05-20 DIAGNOSIS — E782 Mixed hyperlipidemia: Secondary | ICD-10-CM

## 2011-05-20 LAB — CBC WITH DIFFERENTIAL/PLATELET
Basophils Absolute: 0 10*3/uL (ref 0.0–0.1)
Eosinophils Relative: 1 % (ref 0–5)
Lymphocytes Relative: 94 % — ABNORMAL HIGH (ref 12–46)
Lymphs Abs: 60 10*3/uL — ABNORMAL HIGH (ref 0.7–4.0)
Neutro Abs: 3.2 10*3/uL (ref 1.7–7.7)
Neutrophils Relative %: 5 % — ABNORMAL LOW (ref 43–77)
Platelets: 76 10*3/uL — ABNORMAL LOW (ref 150–400)
RBC: 3.15 MIL/uL — ABNORMAL LOW (ref 4.22–5.81)
RDW: 15.9 % — ABNORMAL HIGH (ref 11.5–15.5)
WBC: 64.1 10*3/uL — ABNORMAL HIGH (ref 4.0–10.5)

## 2011-05-20 LAB — PATHOLOGIST SMEAR REVIEW

## 2011-05-21 ENCOUNTER — Encounter (HOSPITAL_COMMUNITY): Payer: Medicare Other

## 2011-05-27 ENCOUNTER — Other Ambulatory Visit (HOSPITAL_COMMUNITY): Payer: Self-pay | Admitting: Oncology

## 2011-05-31 NOTE — Assessment & Plan Note (Signed)
Appropriate rate control

## 2011-05-31 NOTE — Assessment & Plan Note (Signed)
Controlled, no change in medication  

## 2011-05-31 NOTE — Progress Notes (Signed)
  Subjective:    Patient ID: [name], male    DOB: [birth year], 66 y.o.   MRN: [id number]  HPI Pt in for f/u for recent hospitalization for elevated WBC with no underlying infectious cause. Pt unfortunately has evidence of recurrent hematologic malignancy, and I believe that this is the likely issue. States he is not as energetic as in the past, tires easily, and is aware that he has recently been told he has recurrent disease, he is however willing to fight for improved health. Denies any recent fever or chills, appetite is good. Denies depression or uncontrolled anxiety, but is understandably concerned about deteriorating health   Review of Systems See HPI Denies recent fever or chills. Denies sinus pressure, nasal congestion, ear pain or sore throat. Denies chest congestion, productive cough or wheezing. Denies chest pains, palpitations and leg swelling Denies abdominal pain, nausea, vomiting,diarrhea or constipation.   Denies dysuria, frequency, hesitancy or incontinence. . Denies skin break down or rash.        Objective:   Physical Exam  Patient alert and oriented and in no cardiopulmonary distress.Ill appearing  HEENT: No facial asymmetry, EOMI, no sinus tenderness,  oropharynx pink and moist.  Neck supple no adenopathy.  Chest: Clear to auscultation bilaterally.  CVS: S1, S2 systolic murmur, no S3.  ABD: Soft non tender. Bowel sounds normal.  Ext: No edema  MS: Adequate though reduced  ROM spine, shoulders, hips and knees.  Skin: Intact, no ulcerations or rash noted.  Psych: Good eye contact, flat  affect. Memory mildly imppaired not anxious but mildly depressed appearing.  CNS: CN 2-12 intact, power, tone and sensation normal throughout.       Assessment & Plan:

## 2011-06-07 ENCOUNTER — Encounter (HOSPITAL_COMMUNITY): Payer: Medicare Other | Attending: Oncology

## 2011-06-07 DIAGNOSIS — C9110 Chronic lymphocytic leukemia of B-cell type not having achieved remission: Secondary | ICD-10-CM | POA: Insufficient documentation

## 2011-06-07 DIAGNOSIS — R5381 Other malaise: Secondary | ICD-10-CM | POA: Insufficient documentation

## 2011-06-07 DIAGNOSIS — R7309 Other abnormal glucose: Secondary | ICD-10-CM

## 2011-06-07 DIAGNOSIS — E162 Hypoglycemia, unspecified: Secondary | ICD-10-CM | POA: Insufficient documentation

## 2011-06-07 DIAGNOSIS — D801 Nonfamilial hypogammaglobulinemia: Secondary | ICD-10-CM | POA: Insufficient documentation

## 2011-06-07 DIAGNOSIS — E669 Obesity, unspecified: Secondary | ICD-10-CM | POA: Insufficient documentation

## 2011-06-07 DIAGNOSIS — R599 Enlarged lymph nodes, unspecified: Secondary | ICD-10-CM | POA: Insufficient documentation

## 2011-06-07 LAB — CBC
MCH: 35 pg — ABNORMAL HIGH (ref 26.0–34.0)
Platelets: 71 10*3/uL — ABNORMAL LOW (ref 150–400)
RBC: 3.06 MIL/uL — ABNORMAL LOW (ref 4.22–5.81)
WBC: 76.2 10*3/uL (ref 4.0–10.5)

## 2011-06-07 LAB — COMPREHENSIVE METABOLIC PANEL
ALT: 19 U/L (ref 0–53)
AST: 22 U/L (ref 0–37)
CO2: 29 mEq/L (ref 19–32)
Calcium: 9.2 mg/dL (ref 8.4–10.5)
GFR calc non Af Amer: 63 mL/min — ABNORMAL LOW (ref >90)
Potassium: 4.1 mEq/L (ref 3.5–5.1)
Sodium: 139 mEq/L (ref 135–145)
Total Protein: 5.9 g/dL — ABNORMAL LOW (ref 6.0–8.3)

## 2011-06-07 LAB — HEMOGLOBIN A1C: Mean Plasma Glucose: 123 mg/dL — ABNORMAL HIGH (ref <117)

## 2011-06-07 LAB — DIFFERENTIAL
Eosinophils Relative: 1 % (ref 0–5)
Lymphocytes Relative: 94 % — ABNORMAL HIGH (ref 12–46)
Monocytes Absolute: 1.5 10*3/uL — ABNORMAL HIGH (ref 0.1–1.0)
Neutrophils Relative %: 3 % — ABNORMAL LOW (ref 43–77)

## 2011-06-07 NOTE — Progress Notes (Signed)
Labs drawn today for cbc/diff,cmp,ldh,IGG,IGA, IgM, and HgAIC

## 2011-06-08 LAB — IGG, IGA, IGM
IgA: 10 mg/dL — ABNORMAL LOW (ref 68–379)
IgM, Serum: 6 mg/dL — ABNORMAL LOW (ref 41–251)

## 2011-06-11 ENCOUNTER — Encounter (HOSPITAL_BASED_OUTPATIENT_CLINIC_OR_DEPARTMENT_OTHER): Payer: Medicare Other | Admitting: Oncology

## 2011-06-11 VITALS — BP 118/74 | HR 76 | Temp 97.3°F | Wt 253.4 lb

## 2011-06-11 DIAGNOSIS — R1900 Intra-abdominal and pelvic swelling, mass and lump, unspecified site: Secondary | ICD-10-CM

## 2011-06-11 DIAGNOSIS — D801 Nonfamilial hypogammaglobulinemia: Secondary | ICD-10-CM

## 2011-06-11 DIAGNOSIS — C9110 Chronic lymphocytic leukemia of B-cell type not having achieved remission: Secondary | ICD-10-CM

## 2011-06-11 NOTE — Patient Instructions (Signed)
[facility]  Discharge Instructions  RECOMMENDATIONS MADE BY THE CONSULTANT AND ANY TEST RESULTS WILL BE SENT TO YOUR REFERRING DOCTOR.   Purchase a good thermometer. Keep a check on your temperature any time you are sick or feel feverish. For any temperature greater than 100.5 call us as soon as possible. Return to clinic in 8 weeks to see MD. Continue port flush appointments every 6 weeks. Lab work as scheduled.   I acknowledge that I have been informed and understand all the instructions given to me and received a copy. I do not have any more questions at this time, but understand that I may call the Specialty Clinic at [facility] at [phone] during business hours should I have any further questions or need assistance in obtaining follow-up care.    __________________________________________  _____________  __________ Signature of Patient or Authorized Representative            Date                   Time    __________________________________________ Nurse's Signature

## 2011-06-11 NOTE — Progress Notes (Signed)
[name] has CLL and is now recurrent. His white count is rising but his hemoglobin is stable and his platelets are low but stable. He has severe hypogammaglobulinemia. He has not been infected recently. He remains obese and severe deconditioning. His past history of Hodgkin's disease is noted,  there has been no recurrence however. He has no B. symptoms at this time. He has no obvious hepatosplenomegaly. His spleen is enlarged on CAT scans. There are also some enlarged lymph nodes within the abdomen on CAT scan. His lungs today are clear he has no peripheral edema of the arm she is a little puffiness of both legs. His Port-A-Cath is present in the right upper chest wall. Bowel sounds are diminished. His heart shows a regular rhythm and rate without S3 gallop.  I think [name] will require chemotherapy in the near future but presently I think we can withhold it for now he will return to see [place] in 8 weeks with labs and a physical exam. If you were to get an infection he would need IVIG potentially

## 2011-06-14 ENCOUNTER — Telehealth: Payer: Self-pay | Admitting: Family Medicine

## 2011-06-15 NOTE — Telephone Encounter (Signed)
Advise him to come in for rapid strep swab if concerned about bacterial infection, but advice given was appropriate. If we over use antibiotics when he needs them they will not work. Pls check with him about fever , yellow drainage or sputum production if the answer is positive to any of these i need to know, and rthe response needs to be documented also

## 2011-06-15 NOTE — Telephone Encounter (Signed)
Pt has leukemia and has sore throat, wants something called into the pharmacy for it. Adivsed salt water gargles [facility]

## 2011-06-16 NOTE — Telephone Encounter (Signed)
No yellow mucus, no fever  or other symptoms than sore throat. Will continue the salt water gargles and call back if not feeling better or wants to come in for NV for rapid strep

## 2011-07-01 ENCOUNTER — Encounter (HOSPITAL_COMMUNITY): Payer: Medicare Other

## 2011-07-05 ENCOUNTER — Encounter (HOSPITAL_COMMUNITY): Payer: Medicare Other | Attending: Oncology

## 2011-07-05 DIAGNOSIS — Z452 Encounter for adjustment and management of vascular access device: Secondary | ICD-10-CM

## 2011-07-05 DIAGNOSIS — C8589 Other specified types of non-Hodgkin lymphoma, extranodal and solid organ sites: Secondary | ICD-10-CM | POA: Insufficient documentation

## 2011-07-05 DIAGNOSIS — C8590 Non-Hodgkin lymphoma, unspecified, unspecified site: Secondary | ICD-10-CM

## 2011-07-05 DIAGNOSIS — C9110 Chronic lymphocytic leukemia of B-cell type not having achieved remission: Secondary | ICD-10-CM

## 2011-07-05 MED ORDER — HEPARIN SOD (PORK) LOCK FLUSH 100 UNIT/ML IV SOLN
INTRAVENOUS | Status: AC
  Filled 2011-07-05: qty 5

## 2011-07-05 MED ORDER — SODIUM CHLORIDE 0.9 % IJ SOLN
10.0000 mL | INTRAMUSCULAR | Status: DC | PRN
  Administered 2011-07-05: 10 mL via INTRAVENOUS
  Filled 2011-07-05: qty 10

## 2011-07-05 MED ORDER — HEPARIN SOD (PORK) LOCK FLUSH 100 UNIT/ML IV SOLN
500.0000 [IU] | Freq: Once | INTRAVENOUS | Status: AC
  Administered 2011-07-05: 500 [IU] via INTRAVENOUS
  Filled 2011-07-05: qty 5

## 2011-07-05 MED ORDER — SODIUM CHLORIDE 0.9 % IJ SOLN
INTRAMUSCULAR | Status: AC
  Filled 2011-07-05: qty 10

## 2011-07-05 NOTE — Progress Notes (Signed)
Earl Gala presented for Portacath access and flush. Proper placement of portacath confirmed by CXR. Portacath located rt chest wall accessed with  H 20 needle. No blood return and flushes easily. Portacath flushed with 20ml NS and 500U/34ml Heparin and needle removed intact. Procedure without incident. Patient tolerated procedure well.

## 2011-08-01 DIAGNOSIS — D72829 Elevated white blood cell count, unspecified: Secondary | ICD-10-CM | POA: Diagnosis present

## 2011-08-01 DIAGNOSIS — D630 Anemia in neoplastic disease: Secondary | ICD-10-CM | POA: Diagnosis present

## 2011-08-01 DIAGNOSIS — L02419 Cutaneous abscess of limb, unspecified: Secondary | ICD-10-CM | POA: Diagnosis present

## 2011-08-01 DIAGNOSIS — N390 Urinary tract infection, site not specified: Secondary | ICD-10-CM | POA: Diagnosis present

## 2011-08-01 DIAGNOSIS — Z9189 Other specified personal risk factors, not elsewhere classified: Secondary | ICD-10-CM

## 2011-08-01 DIAGNOSIS — T451X5A Adverse effect of antineoplastic and immunosuppressive drugs, initial encounter: Secondary | ICD-10-CM | POA: Diagnosis present

## 2011-08-01 DIAGNOSIS — I739 Peripheral vascular disease, unspecified: Secondary | ICD-10-CM | POA: Diagnosis present

## 2011-08-01 DIAGNOSIS — I10 Essential (primary) hypertension: Secondary | ICD-10-CM | POA: Diagnosis present

## 2011-08-01 DIAGNOSIS — Z954 Presence of other heart-valve replacement: Secondary | ICD-10-CM

## 2011-08-01 DIAGNOSIS — I2510 Atherosclerotic heart disease of native coronary artery without angina pectoris: Secondary | ICD-10-CM | POA: Diagnosis present

## 2011-08-01 DIAGNOSIS — R7309 Other abnormal glucose: Secondary | ICD-10-CM | POA: Diagnosis present

## 2011-08-01 DIAGNOSIS — E669 Obesity, unspecified: Secondary | ICD-10-CM | POA: Diagnosis present

## 2011-08-01 DIAGNOSIS — D649 Anemia, unspecified: Secondary | ICD-10-CM | POA: Diagnosis present

## 2011-08-01 DIAGNOSIS — D6959 Other secondary thrombocytopenia: Secondary | ICD-10-CM | POA: Diagnosis present

## 2011-08-01 DIAGNOSIS — Z6836 Body mass index (BMI) 36.0-36.9, adult: Secondary | ICD-10-CM

## 2011-08-01 DIAGNOSIS — C8190 Hodgkin lymphoma, unspecified, unspecified site: Secondary | ICD-10-CM | POA: Diagnosis present

## 2011-08-01 DIAGNOSIS — E785 Hyperlipidemia, unspecified: Secondary | ICD-10-CM | POA: Diagnosis present

## 2011-08-01 DIAGNOSIS — I4891 Unspecified atrial fibrillation: Secondary | ICD-10-CM | POA: Diagnosis present

## 2011-08-01 DIAGNOSIS — R5381 Other malaise: Secondary | ICD-10-CM | POA: Diagnosis present

## 2011-08-01 DIAGNOSIS — D839 Common variable immunodeficiency, unspecified: Secondary | ICD-10-CM | POA: Diagnosis present

## 2011-08-01 DIAGNOSIS — Z951 Presence of aortocoronary bypass graft: Secondary | ICD-10-CM

## 2011-08-01 DIAGNOSIS — A419 Sepsis, unspecified organism: Secondary | ICD-10-CM | POA: Diagnosis present

## 2011-08-01 DIAGNOSIS — C9110 Chronic lymphocytic leukemia of B-cell type not having achieved remission: Secondary | ICD-10-CM | POA: Diagnosis present

## 2011-08-01 DIAGNOSIS — R509 Fever, unspecified: Secondary | ICD-10-CM | POA: Diagnosis present

## 2011-08-01 DIAGNOSIS — R000 Tachycardia, unspecified: Secondary | ICD-10-CM | POA: Diagnosis not present

## 2011-08-01 DIAGNOSIS — N400 Enlarged prostate without lower urinary tract symptoms: Secondary | ICD-10-CM | POA: Diagnosis present

## 2011-08-01 DIAGNOSIS — Z9221 Personal history of antineoplastic chemotherapy: Secondary | ICD-10-CM

## 2011-08-02 ENCOUNTER — Emergency Department (HOSPITAL_COMMUNITY): Payer: Medicare Other

## 2011-08-02 ENCOUNTER — Inpatient Hospital Stay (HOSPITAL_COMMUNITY)
Admission: EM | Admit: 2011-08-02 | Discharge: 2011-08-05 | DRG: 872 | Disposition: A | Discharge status: Home / Self Care | Payer: Medicare Other | Attending: Internal Medicine | Admitting: Internal Medicine

## 2011-08-02 ENCOUNTER — Encounter (HOSPITAL_COMMUNITY): Payer: Self-pay

## 2011-08-02 DIAGNOSIS — D801 Nonfamilial hypogammaglobulinemia: Secondary | ICD-10-CM | POA: Diagnosis present

## 2011-08-02 DIAGNOSIS — L03119 Cellulitis of unspecified part of limb: Secondary | ICD-10-CM

## 2011-08-02 DIAGNOSIS — C9112 Chronic lymphocytic leukemia of B-cell type in relapse: Secondary | ICD-10-CM

## 2011-08-02 DIAGNOSIS — E782 Mixed hyperlipidemia: Secondary | ICD-10-CM | POA: Diagnosis present

## 2011-08-02 DIAGNOSIS — K219 Gastro-esophageal reflux disease without esophagitis: Secondary | ICD-10-CM | POA: Diagnosis present

## 2011-08-02 DIAGNOSIS — D649 Anemia, unspecified: Secondary | ICD-10-CM | POA: Diagnosis present

## 2011-08-02 DIAGNOSIS — D696 Thrombocytopenia, unspecified: Secondary | ICD-10-CM | POA: Diagnosis present

## 2011-08-02 DIAGNOSIS — I10 Essential (primary) hypertension: Secondary | ICD-10-CM | POA: Diagnosis present

## 2011-08-02 DIAGNOSIS — E669 Obesity, unspecified: Secondary | ICD-10-CM | POA: Diagnosis present

## 2011-08-02 DIAGNOSIS — N390 Urinary tract infection, site not specified: Secondary | ICD-10-CM | POA: Diagnosis present

## 2011-08-02 DIAGNOSIS — Z8571 Personal history of Hodgkin lymphoma: Secondary | ICD-10-CM

## 2011-08-02 DIAGNOSIS — I4891 Unspecified atrial fibrillation: Secondary | ICD-10-CM | POA: Insufficient documentation

## 2011-08-02 DIAGNOSIS — E860 Dehydration: Secondary | ICD-10-CM

## 2011-08-02 DIAGNOSIS — R509 Fever, unspecified: Secondary | ICD-10-CM | POA: Diagnosis present

## 2011-08-02 DIAGNOSIS — R7303 Prediabetes: Secondary | ICD-10-CM | POA: Diagnosis present

## 2011-08-02 DIAGNOSIS — C9192 Lymphoid leukemia, unspecified, in relapse: Secondary | ICD-10-CM

## 2011-08-02 DIAGNOSIS — I2510 Atherosclerotic heart disease of native coronary artery without angina pectoris: Secondary | ICD-10-CM | POA: Diagnosis present

## 2011-08-02 DIAGNOSIS — C9110 Chronic lymphocytic leukemia of B-cell type not having achieved remission: Secondary | ICD-10-CM | POA: Diagnosis present

## 2011-08-02 DIAGNOSIS — L02419 Cutaneous abscess of limb, unspecified: Secondary | ICD-10-CM

## 2011-08-02 DIAGNOSIS — R000 Tachycardia, unspecified: Secondary | ICD-10-CM | POA: Diagnosis present

## 2011-08-02 HISTORY — DX: Nonfamilial hypogammaglobulinemia: D80.1

## 2011-08-02 HISTORY — DX: Prediabetes: R73.03

## 2011-08-02 LAB — CBC
Hemoglobin: 10.5 g/dL — ABNORMAL LOW (ref 13.0–17.0)
MCH: 35.4 pg — ABNORMAL HIGH (ref 26.0–34.0)
MCV: 107.9 fL — ABNORMAL HIGH (ref 78.0–100.0)
MCV: 108.6 fL — ABNORMAL HIGH (ref 78.0–100.0)
Platelets: 54 10*3/uL — ABNORMAL LOW (ref 150–400)
Platelets: 63 10*3/uL — ABNORMAL LOW (ref 150–400)
RBC: 2.68 MIL/uL — ABNORMAL LOW (ref 4.22–5.81)
RBC: 2.91 MIL/uL — ABNORMAL LOW (ref 4.22–5.81)
RDW: 15.9 % — ABNORMAL HIGH (ref 11.5–15.5)
WBC: 125.8 10*3/uL (ref 4.0–10.5)

## 2011-08-02 LAB — LACTIC ACID, PLASMA: Lactic Acid, Venous: 0.6 mmol/L (ref 0.5–2.2)

## 2011-08-02 LAB — COMPREHENSIVE METABOLIC PANEL
AST: 28 U/L (ref 0–37)
Albumin: 3.8 g/dL (ref 3.5–5.2)
BUN: 21 mg/dL (ref 6–23)
CO2: 24 mEq/L (ref 19–32)
Calcium: 8.6 mg/dL (ref 8.4–10.5)
Creatinine, Ser: 1.23 mg/dL (ref 0.50–1.35)
Creatinine, Ser: 1.26 mg/dL (ref 0.50–1.35)
GFR calc non Af Amer: 57 mL/min — ABNORMAL LOW (ref >90)
Total Protein: 5.8 g/dL — ABNORMAL LOW (ref 6.0–8.3)

## 2011-08-02 LAB — DIFFERENTIAL
Eosinophils Absolute: 1.3 10*3/uL — ABNORMAL HIGH (ref 0.0–0.7)
Eosinophils Relative: 1 % (ref 0–5)
Metamyelocytes Relative: 0 %
Monocytes Absolute: 1.3 10*3/uL — ABNORMAL HIGH (ref 0.1–1.0)
Monocytes Relative: 1 % — ABNORMAL LOW (ref 3–12)
Neutro Abs: 3.8 10*3/uL (ref 1.7–7.7)
Neutrophils Relative %: 3 % — ABNORMAL LOW (ref 43–77)
nRBC: 0 /100 WBC

## 2011-08-02 LAB — HEPATIC FUNCTION PANEL
ALT: 29 U/L (ref 0–53)
AST: 38 U/L — ABNORMAL HIGH (ref 0–37)
Alkaline Phosphatase: 110 U/L (ref 39–117)
Bilirubin, Direct: 0.2 mg/dL (ref 0.0–0.3)
Total Bilirubin: 0.9 mg/dL (ref 0.3–1.2)

## 2011-08-02 LAB — URINALYSIS, ROUTINE W REFLEX MICROSCOPIC
Ketones, ur: NEGATIVE mg/dL
Nitrite: NEGATIVE
Protein, ur: 30 mg/dL — AB
Urobilinogen, UA: 2 mg/dL — ABNORMAL HIGH (ref 0.0–1.0)

## 2011-08-02 LAB — HEMOGLOBIN A1C: Hgb A1c MFr Bld: 6.3 % — ABNORMAL HIGH (ref <5.7)

## 2011-08-02 LAB — URINE MICROSCOPIC-ADD ON

## 2011-08-02 MED ORDER — ONDANSETRON HCL 4 MG PO TABS: 4.0000 mg | ORAL_TABLET | Freq: Four times a day (QID) | ORAL | Status: DC | PRN

## 2011-08-02 MED ORDER — BISACODYL 5 MG PO TBEC: 5.0000 mg | DELAYED_RELEASE_TABLET | Freq: Every day | ORAL | Status: DC | PRN

## 2011-08-02 MED ORDER — ATORVASTATIN CALCIUM 40 MG PO TABS
40.0000 mg | ORAL_TABLET | Freq: Every day | ORAL | Status: DC
  Administered 2011-08-02 – 2011-08-04 (×3): 40 mg via ORAL
  Filled 2011-08-02 (×3): qty 1

## 2011-08-02 MED ORDER — ACYCLOVIR 200 MG PO CAPS
200.0000 mg | ORAL_CAPSULE | Freq: Two times a day (BID) | ORAL | Status: DC
  Administered 2011-08-02 – 2011-08-05 (×8): 200 mg via ORAL
  Filled 2011-08-02 (×10): qty 1

## 2011-08-02 MED ORDER — PIPERACILLIN-TAZOBACTAM 3.375 G IVPB
3.3750 g | Freq: Three times a day (TID) | INTRAVENOUS | Status: DC
  Filled 2011-08-02: qty 50

## 2011-08-02 MED ORDER — ACETAMINOPHEN 325 MG PO TABS
650.0000 mg | ORAL_TABLET | ORAL | Status: DC | PRN
  Administered 2011-08-02 – 2011-08-04 (×4): 650 mg via ORAL
  Filled 2011-08-02 (×3): qty 2

## 2011-08-02 MED ORDER — DILTIAZEM HCL 100 MG IV SOLR
5.0000 mg/h | INTRAVENOUS | Status: DC
  Filled 2011-08-02: qty 100

## 2011-08-02 MED ORDER — VANCOMYCIN HCL IN DEXTROSE 1-5 GM/200ML-% IV SOLN
1000.0000 mg | Freq: Two times a day (BID) | INTRAVENOUS | Status: DC
  Administered 2011-08-02 – 2011-08-04 (×4): 1000 mg via INTRAVENOUS
  Filled 2011-08-02 (×6): qty 200

## 2011-08-02 MED ORDER — PANTOPRAZOLE SODIUM 40 MG PO TBEC
40.0000 mg | DELAYED_RELEASE_TABLET | Freq: Every day | ORAL | Status: DC
  Administered 2011-08-02 – 2011-08-05 (×4): 40 mg via ORAL
  Filled 2011-08-02 (×5): qty 1

## 2011-08-02 MED ORDER — ENOXAPARIN SODIUM 40 MG/0.4ML ~~LOC~~ SOLN
40.0000 mg | SUBCUTANEOUS | Status: DC
  Administered 2011-08-02: 40 mg via SUBCUTANEOUS
  Filled 2011-08-02: qty 0.4

## 2011-08-02 MED ORDER — VANCOMYCIN HCL IN DEXTROSE 1-5 GM/200ML-% IV SOLN
1000.0000 mg | INTRAVENOUS | Status: AC
  Administered 2011-08-02 (×2): 1000 mg via INTRAVENOUS
  Filled 2011-08-02 (×2): qty 200

## 2011-08-02 MED ORDER — FLEET ENEMA 7-19 GM/118ML RE ENEM: 1.0000 | ENEMA | Freq: Once | RECTAL | Status: AC | PRN

## 2011-08-02 MED ORDER — FOLIC ACID 1 MG PO TABS: 1.0000 mg | ORAL_TABLET | Freq: Every day | ORAL | Status: DC

## 2011-08-02 MED ORDER — SODIUM CHLORIDE 0.9 % IV BOLUS (SEPSIS)
500.0000 mL | Freq: Once | INTRAVENOUS | Status: AC
  Administered 2011-08-02: 500 mL via INTRAVENOUS

## 2011-08-02 MED ORDER — SODIUM CHLORIDE 0.9 % IV BOLUS (SEPSIS): 500.0000 mL | Freq: Once | INTRAVENOUS | Status: AC

## 2011-08-02 MED ORDER — SODIUM CHLORIDE 0.9 % IJ SOLN
INTRAMUSCULAR | Status: AC
  Filled 2011-08-02: qty 6

## 2011-08-02 MED ORDER — LEVOFLOXACIN IN D5W 750 MG/150ML IV SOLN: 750.0000 mg | INTRAVENOUS | Status: DC

## 2011-08-02 MED ORDER — ASPIRIN EC 81 MG PO TBEC: 81.0000 mg | DELAYED_RELEASE_TABLET | Freq: Every day | ORAL | Status: DC

## 2011-08-02 MED ORDER — ATORVASTATIN CALCIUM 40 MG PO TABS: 40.0000 mg | ORAL_TABLET | Freq: Every day | ORAL | Status: DC

## 2011-08-02 MED ORDER — PIPERACILLIN-TAZOBACTAM 3.375 G IVPB
INTRAVENOUS | Status: AC
  Filled 2011-08-02: qty 50

## 2011-08-02 MED ORDER — SODIUM CHLORIDE 0.9 % IV SOLN
INTRAVENOUS | Status: DC
  Administered 2011-08-02 – 2011-08-03 (×3): via INTRAVENOUS
  Filled 2011-08-02 (×8): qty 1000

## 2011-08-02 MED ORDER — SODIUM CHLORIDE 0.9 % IJ SOLN
INTRAMUSCULAR | Status: AC
  Filled 2011-08-02: qty 3

## 2011-08-02 MED ORDER — DIPHENHYDRAMINE HCL 50 MG/ML IJ SOLN
50.0000 mg | Freq: Once | INTRAMUSCULAR | Status: AC
  Administered 2011-08-02: 50 mg via INTRAVENOUS
  Filled 2011-08-02: qty 1

## 2011-08-02 MED ORDER — LEVOFLOXACIN IN D5W 750 MG/150ML IV SOLN
750.0000 mg | Freq: Once | INTRAVENOUS | Status: AC
  Administered 2011-08-02: 750 mg via INTRAVENOUS

## 2011-08-02 MED ORDER — TRAZODONE HCL 50 MG PO TABS: 25.0000 mg | ORAL_TABLET | Freq: Every evening | ORAL | Status: DC | PRN

## 2011-08-02 MED ORDER — METOPROLOL TARTRATE 50 MG PO TABS: 50.0000 mg | ORAL_TABLET | Freq: Two times a day (BID) | ORAL | Status: DC

## 2011-08-02 MED ORDER — VANCOMYCIN HCL IN DEXTROSE 1-5 GM/200ML-% IV SOLN
INTRAVENOUS | Status: AC
  Filled 2011-08-02: qty 400

## 2011-08-02 MED ORDER — SODIUM CHLORIDE 0.9 % IV SOLN: INTRAVENOUS | Status: DC

## 2011-08-02 MED ORDER — PIPERACILLIN-TAZOBACTAM 3.375 G IVPB
3.3750 g | Freq: Once | INTRAVENOUS | Status: AC
  Administered 2011-08-02: 3.375 g via INTRAVENOUS
  Filled 2011-08-02: qty 50

## 2011-08-02 MED ORDER — DEXTROSE 5 % IV SOLN
1.0000 g | Freq: Two times a day (BID) | INTRAVENOUS | Status: DC
  Administered 2011-08-02 – 2011-08-05 (×7): 1 g via INTRAVENOUS
  Filled 2011-08-02 (×9): qty 1

## 2011-08-02 MED ORDER — ACETAMINOPHEN 650 MG RE SUPP: 650.0000 mg | Freq: Four times a day (QID) | RECTAL | Status: DC | PRN

## 2011-08-02 MED ORDER — POTASSIUM CHLORIDE IN NACL 20-0.9 MEQ/L-% IV SOLN
INTRAVENOUS | Status: AC
  Administered 2011-08-02: 1000 mL
  Filled 2011-08-02: qty 1000

## 2011-08-02 MED ORDER — FOLIC ACID 1 MG PO TABS
1.0000 mg | ORAL_TABLET | Freq: Every day | ORAL | Status: DC
  Administered 2011-08-02 – 2011-08-05 (×4): 1 mg via ORAL
  Filled 2011-08-02 (×4): qty 1

## 2011-08-02 MED ORDER — VANCOMYCIN HCL 1000 MG IV SOLR: 1500.0000 mg | INTRAVENOUS | Status: DC

## 2011-08-02 MED ORDER — ONDANSETRON HCL 4 MG/2ML IJ SOLN
4.0000 mg | INTRAMUSCULAR | Status: DC | PRN
  Administered 2011-08-03: 4 mg via INTRAVENOUS
  Filled 2011-08-02: qty 2

## 2011-08-02 MED ORDER — IMMUNE GLOBULIN (HUMAN) 20 GM/200ML IV SOLN
1.0000 g/kg | INTRAVENOUS | Status: AC
  Administered 2011-08-02 – 2011-08-03 (×2): 115 g via INTRAVENOUS
  Filled 2011-08-02 (×2): qty 1150

## 2011-08-02 NOTE — Progress Notes (Addendum)
ANTIBIOTIC CONSULT NOTE - INITIAL  Pharmacy Consult for Vancomycin, Zosyn, Levaquin Indication: sepsis  No Known Allergies  Patient Measurements: Height: 5\' 10"  (177.8 cm) Weight: 252 lb 6.8 oz (114.5 kg) IBW/kg (Calculated) : 73   Vital Signs: Temp: 100.1 F (37.8 C) (05/06 0400) Temp src: Oral (05/06 0400) BP: 85/50 mmHg (05/06 0600) Pulse Rate: 80  (05/06 0600) Intake/Output from previous day: 05/05 0701 - 05/06 0700 In: 911.7 [I.V.:311.7; IV Piggyback:600] Out: 400 [Urine:400] Intake/Output from this shift:    Labs:  Basename 08/02/11 0422 08/02/11 0021  WBC 105.2* 125.8*  HGB 9.5* 10.5*  PLT 54* 63*  LABCREA -- --  CREATININE 1.26 1.23   Estimated Creatinine Clearance: 72.1 ml/min (by C-G formula based on Cr of 1.26). No results found for this basename: VANCOTROUGH:2,VANCOPEAK:2,VANCORANDOM:2,GENTTROUGH:2,GENTPEAK:2,GENTRANDOM:2,TOBRATROUGH:2,TOBRAPEAK:2,TOBRARND:2,AMIKACINPEAK:2,AMIKACINTROU:2,AMIKACIN:2, in the last 72 hours   Microbiology: Recent Results (from the past 720 hour(s))  CULTURE, BLOOD (ROUTINE X 2)     Status: Normal (Preliminary result)   Collection Time   08/02/11  1:47 AM      Component Value Range Status Comment   Specimen Description Blood RIGHT ANTECUBITAL   Final    Special Requests BOTTLES DRAWN AEROBIC AND ANAEROBIC 8CC   Final    Culture PENDING   Incomplete    Report Status PENDING   Incomplete   CULTURE, BLOOD (ROUTINE X 2)     Status: Normal (Preliminary result)   Collection Time   08/02/11  1:51 AM      Component Value Range Status Comment   Specimen Description Blood BLOOD RIGHT HAND   Final    Special Requests BOTTLES DRAWN AEROBIC AND ANAEROBIC 6CC   Final    Culture PENDING   Incomplete    Report Status PENDING   Incomplete   MRSA PCR SCREENING     Status: Normal   Collection Time   08/02/11  3:13 AM      Component Value Range Status Comment   MRSA by PCR NEGATIVE  NEGATIVE  Final     Medical History: Past Medical History   Diagnosis Date  . Aortic stenosis   . Atrial fibrillation   . Coronary atherosclerosis of native coronary artery   . Hyperlipidemia   . Essential hypertension, benign   . BPH (benign prostatic hypertrophy)   . Elevated WBC count   . Arthritis   . Acid reflux   . CLL (chronic lymphoblastic leukemia)   . Hodgkin's disease   . Leukemia     Medications:  Scheduled:    . sodium chloride   Intravenous STAT  . 0.9 % NaCl with KCl 20 mEq / L      . acyclovir  200 mg Oral BID  . aspirin EC  81 mg Oral Daily  . atorvastatin  40 mg Oral q1800  . enoxaparin  40 mg Subcutaneous Q24H  . folic acid  1 mg Oral Daily  . levofloxacin (LEVAQUIN) IV  750 mg Intravenous Once  . metoprolol  50 mg Oral BID  . pantoprazole  40 mg Oral Q1200  . piperacillin-tazobactam (ZOSYN)  IV  3.375 g Intravenous Once  . sodium chloride  500 mL Intravenous Once  . vancomycin  1,000 mg Intravenous Q90 Min   Assessment: Pt with CLL presents with fevers and elevated WBC (last known level =76K in 05/2011) started on empiric broad-spectrum antibiotics with Vanc, Levaquin, and Zoysn. Renal function stable.  Cx data pending.   Goal of Therapy:  Vancomycin trough level 15-20 mcg/ml  Plan:  1) Vancomcyin 1000mg  IV q12h 2) Zoysn 3.375gm IV q8h infused over 4 hrs 3) Levaquin 750mg  IV q24h 4) Vanc trough level at steady state 5) Monitor renal function and patient progress  Kynzlie Hilleary, [facility], PHARMD 08/02/2011,8:10 AM

## 2011-08-02 NOTE — ED Notes (Signed)
Dr. Orvan Falconer at beside to evaluate patient.

## 2011-08-02 NOTE — H&P (Signed)
PCP:   [name], MD, MD   Oncologist:  Glenford Peers M.D.  Chief Complaint:  Fever since yesterday evening  HPI: [name] is an 66 y.o. male.  Past history of chemotherapy for Hodgkin's lymphoma, now with CLL, and hypogammaglobulinemia, last known white count 76,000 in March, has been having fever and chills since this evening, check his temperature to be about 102.5 at about 7 PM, and that's 101.4, shortly before his self-report to arriving to the emergency room at 10 PM. He emergency room patient's true was measured at 99.8, and the hospitalist service was called to assist with management.  Patient denies fever cough or cold, chest pain nausea or vomiting. Denies dysuria or frequency, denies body aches, or skin rash. Denies nosebleeds denies bloody or black stool.   Admits to easy bruising of skin. He lives with his wife and son and 8  cats  Rewiew of Systems:  The patient denies anorexia, fever, weight loss,, vision loss, decreased hearing, hoarseness, chest pain, syncope,  peripheral edema, balance deficits, hemoptysis, abdominal pain, melena, hematochezia, severe indigestion/heartburn, hematuria, incontinence, genital sores, muscle weakness, suspicious skin lesions, transient blindness, difficulty walking, depression, unusual weight change, abnormal bleeding, enlarged lymph nodes, angioedema, and breast masses.   Past Medical History  Diagnosis Date  . CLL (chronic lymphoblastic leukemia)   . Hodgkin's disease   . Aortic stenosis   . Atrial fibrillation   . Coronary atherosclerosis of native coronary artery   . Hyperlipidemia   . Essential hypertension, benign   . BPH (benign prostatic hypertrophy)   . Elevated WBC count   . Arthritis   . Acid reflux   . Leukemia     Past Surgical History  Procedure Date  . Eye surgery   . 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 Aortic valve replacement 10/09    23mm Magna Pericardial   .  Esophagogastroduodenoscopy     scrapping of throat and stretching  . Lithotripsy   . Bone marrow aspiration   . Portacath placement   . Colon surgery     Medications:  HOME MEDS: Prior to Admission medications   Medication Sig Start Date End Date Taking? Authorizing Provider  acetaminophen (TYLENOL) 500 MG tablet Take 1,000 mg by mouth once as needed. For pain    Yes Historical Provider, MD  acyclovir (ZOVIRAX) 200 MG capsule Take 200 mg by mouth 2 (two) times daily. Take one tablet by mouth twice a day    Yes Historical Provider, MD  aspirin (ASPIRIN LOW DOSE) 81 MG EC tablet Take 81 mg by mouth daily. Take one tablet by mouth daily    Yes Historical Provider, MD  folic acid (FOLVITE) 1 MG tablet TAKE ONE TABLET BY MOUTH EVERY DAY 05/27/11  Yes Ellouise Newer, PA  metoprolol (LOPRESSOR) 50 MG tablet TAKE ONE TABLET BY MOUTH TWICE DAILY 01/29/11  Yes Jonelle Sidle, MD  Multiple Vitamins-Minerals (CENTRUM SILVER ULTRA MENS PO) Take 1 tablet by mouth daily.   Yes Historical Provider, MD  omeprazole (PRILOSEC) 20 MG capsule Take 20 mg by mouth every morning.    Yes Historical Provider, MD  rosuvastatin (CRESTOR) 20 MG tablet Take 1 tablet (20 mg total) by mouth at bedtime. 12/18/10 12/18/11 Yes Kerri Perches, MD  OMEGA 3 1000 MG CAPS Take 1,000 mg by mouth every morning.     Historical Provider, MD     Allergies:  No Known Allergies  Social History:  reports that he has never smoked. He has never used smokeless tobacco. He reports that he does not drink alcohol or use illicit drugs.  Family History: Family History  Problem Relation Age of Onset  . Heart failure Mother   . Blindness Mother   . Cancer Father     Lung      Physical Exam: Filed Vitals:   08/02/11 0003 08/02/11 0137  BP: 119/59 100/60  Pulse: 97 85  Temp: 99.6 F (37.6 C) 99.8 F (37.7 C)  TempSrc: Oral Oral  Resp: 20 20  Height: 5\' 10"  (1.778 m)   Weight: 108.863 kg (240 lb)   SpO2: 95% 98%    Blood pressure 100/60, pulse 85, temperature 99.8 F (37.7 C), temperature source Oral, resp. rate 20, height 5\' 10"  (1.778 m), weight 108.863 kg (240 lb), SpO2 98.00%.  GEN:  Pleasant obese Caucasian gentleman lying in the stretcher in no distress; cooperative with exam; smells of cats. PSYCH:  alert and oriented x4; does not appear anxious or depressed; affect is appropriate. HEENT: Mucous membranes pink and anicteric; PERRLA; EOM intact; thick nec thick neck; no thyromegaly or carotid bruit; no JVD; Breasts:: Not examined CHEST WALL: No tenderness CHEST: Normal respiration, clear to auscultation bilaterally HEART: Regular rate and rhythm; no murmurs rubs or gallops BACK:  no CVA tenderness ABDOMEN: Obese, soft non-tender; no masses, no organomegaly, normal abdominal bowel sounds; no pannus; no intertriginous candida. Rectal Exam: Not done EXTREMITIES: No bone or joint deformity; age-appropriate arthropathy of the hands and knees;  trace  edema of legs; no ulcerations. Genitalia: not examined PULSES: 2+ and symmetric SKIN:  moist and warm, occasional ecchymoses right arm no  other rash or ulceration CNS: Cranial nerves 2-12 grossly intact no focal lateralizing neurologic deficit   Labs & Imaging Results for orders placed during the hospital encounter of 08/02/11 (from the past 48 hour(s))  URINALYSIS, ROUTINE W REFLEX MICROSCOPIC     Status: Abnormal   Collection Time   08/02/11 12:20 AM      Component Value Range Comment   Color, Urine YELLOW  YELLOW     APPearance CLEAR  CLEAR     Specific Gravity, Urine 1.015  1.005 - 1.030     pH 6.0  5.0 - 8.0     Glucose, UA NEGATIVE  NEGATIVE (mg/dL)    Hgb urine dipstick TRACE (*) NEGATIVE     Bilirubin Urine NEGATIVE  NEGATIVE     Ketones, ur NEGATIVE  NEGATIVE (mg/dL)    Protein, ur 30 (*) NEGATIVE (mg/dL)    Urobilinogen, UA 2.0 (*) 0.0 - 1.0 (mg/dL)    Nitrite NEGATIVE  NEGATIVE     Leukocytes, UA SMALL (*) NEGATIVE    URINE  MICROSCOPIC-ADD ON     Status: Abnormal   Collection Time   08/02/11 12:20 AM      Component Value Range Comment   Squamous Epithelial / LPF FEW (*) RARE     WBC, UA 0-2  <3 (WBC/hpf)    RBC / HPF 3-6  <3 (RBC/hpf)    Bacteria, UA FEW (*) RARE    CBC     Status: Abnormal   Collection Time   08/02/11 12:21 AM      Component Value Range Comment   WBC 125.8 (*) 4.0 - 10.5 (K/uL)    RBC 2.91 (*) 4.22 - 5.81 (MIL/uL)    Hemoglobin 10.5 (*) 13.0 - 17.0 (g/dL)    HCT 40.9 (*) 81.1 - 52.0 (%)  MCV 107.9 (*) 78.0 - 100.0 (fL)    MCH 36.1 (*) 26.0 - 34.0 (pg)    MCHC 33.4  30.0 - 36.0 (g/dL)    RDW 82.9 (*) 56.2 - 15.5 (%)    Platelets 63 (*) 150 - 400 (K/uL)   DIFFERENTIAL     Status: Abnormal   Collection Time   08/02/11 12:21 AM      Component Value Range Comment   Neutrophils Relative 3 (*) 43 - 77 (%)    Lymphocytes Relative 95 (*) 12 - 46 (%)    Monocytes Relative 1 (*) 3 - 12 (%)    Eosinophils Relative 1  0 - 5 (%)    Basophils Relative 0  0 - 1 (%)    Band Neutrophils 0  0 - 10 (%)    Metamyelocytes Relative 0      Myelocytes 0      Promyelocytes Absolute 0      Blasts 0      nRBC 0  0 (/100 WBC)    Neutro Abs 3.8  1.7 - 7.7 (K/uL)    Lymphs Abs 119.4 (*) 0.7 - 4.0 (K/uL)    Monocytes Absolute 1.3 (*) 0.1 - 1.0 (K/uL)    Eosinophils Absolute 1.3 (*) 0.0 - 0.7 (K/uL)    Basophils Absolute 0.0  0.0 - 0.1 (K/uL)   COMPREHENSIVE METABOLIC PANEL     Status: Abnormal   Collection Time   08/02/11 12:21 AM      Component Value Range Comment   Sodium 136  135 - 145 (mEq/L)    Potassium 4.1  3.5 - 5.1 (mEq/L)    Chloride 101  96 - 112 (mEq/L)    CO2 22  19 - 32 (mEq/L)    Glucose, Bld 150 (*) 70 - 99 (mg/dL)    BUN 21  6 - 23 (mg/dL)    Creatinine, Ser 1.30  0.50 - 1.35 (mg/dL)    Calcium 8.9  8.4 - 10.5 (mg/dL)    Total Protein 5.8 (*) 6.0 - 8.3 (g/dL)    Albumin 3.8  3.5 - 5.2 (g/dL)    AST 37  0 - 37 (U/L)    ALT 29  0 - 53 (U/L)    Alkaline Phosphatase 110  39 - 117 (U/L)     Total Bilirubin 1.0  0.3 - 1.2 (mg/dL)    GFR calc non Af Amer 59 (*) >90 (mL/min)    GFR calc Af Amer 68 (*) >90 (mL/min)    Dg Chest 2 View  08/02/2011  *RADIOLOGY REPORT*  Clinical Data: Weakness, fever and shortness of breath.  CHEST - 2 VIEW  Comparison: Chest radiograph performed 05/11/2011  Findings: The lungs are well-aerated and clear.  There is no evidence of focal opacification, pleural effusion or pneumothorax.  The heart is normal in size; the patient is status post median sternotomy, with evidence of prior CABG.  An aortic valve replacement is noted.  A right subclavian line is noted ending about the mid to distal SVC.  No acute osseous abnormalities are seen.  IMPRESSION: No acute cardiopulmonary process seen.  Original Report Authenticated By: Tonia Ghent, M.D.      Assessment Present on Admission:   .FEVER UNSPECIFIED .Hypogammaglobulinemia, acquired .CLL  .HYPERLIPIDEMIA .OBESITY .CORONARY ATHEROSCLEROSIS NATIVE CORONARY ARTERY  .Essential hypertension, benign .GERD (gastroesophageal reflux disease) .Thrombocytopenia  .Anemia   PLAN: Because of the patient's immune deficiency, and dysfunctional white cells, he needs to be investigated for occult infection.  We'll bring in on observation to start septic workup, and start empiric antibiotics.   Patient does have a Port-A-Cath. We'll consult oncologist in the morning see if they wish to continue treatment and evaluation as an inpatient or outpatient  Despite his thrombocytopenia, will give Lovenox for DVT prophylaxis; but will monitor his platelets Other plans as per orders.   Amiyah Shryock 08/02/2011, 2:53 AM

## 2011-08-02 NOTE — ED Notes (Signed)
Fever onset today, feels weak, denies n/v/d

## 2011-08-02 NOTE — Care Management Note (Addendum)
    Page 1 of 1   08/05/2011     12:06:39 PM   CARE MANAGEMENT NOTE 08/05/2011  Patient:  [name], [name]   Account Number:  [id number]  Date Initiated:  08/02/2011  Documentation initiated by:  [facility]  Subjective/Objective Assessment:   65 YO ADM THROUGH ED FEBRILE/CHILLS IN THE SETTING OF CLL AND PORTACATH.     Action/Plan:   IPTA WITH FAMILY SUPPORT.   Anticipated DC Date:  08/05/2011   Anticipated DC Plan:  HOME/SELF CARE         Choice offered to / List presented to:             Status of service:  Completed, signed off Medicare Important Message given?   (If response is "NO", the following Medicare IM given date fields will be blank) Date Medicare IM given:   Date Additional Medicare IM given:    Discharge Disposition:  HOME/SELF CARE  Per UR Regulation:  Reviewed for med. necessity/level of care/duration of stay  If discussed at Long Length of Stay Meetings, dates discussed:    Comments:  08/05/11 1200 Latavion Halls Leanord Hawking RN BSN CM

## 2011-08-02 NOTE — Progress Notes (Signed)
ANTIBIOTIC CONSULT NOTE - INITIAL  Pharmacy Consult for vancomycin, Zosyn, & Levaquin Indication: FUO in patient with CLL  No Known Allergies  Patient Measurements: Height: 5\' 10"  (177.8 cm) Weight: 240 lb (108.863 kg) IBW/kg (Calculated) : 73  Adjusted Body Weight: 85kg  Vital Signs: Temp: 99.8 F (37.7 C) (05/06 0137) Temp src: Oral (05/06 0137) BP: 100/60 mmHg (05/06 0137) Pulse Rate: 85  (05/06 0137) Intake/Output from previous day:   Intake/Output from this shift:    Labs:  Basename 08/02/11 0021  WBC 125.8*  HGB 10.5*  PLT 63*  LABCREA --  CREATININE 1.23   Estimated Creatinine Clearance: 72 ml/min (by C-G formula based on Cr of 1.23).   Microbiology: No results found for this or any previous visit (from the past 720 hour(s)).  Medical History: Past Medical History  Diagnosis Date  . CLL (chronic lymphoblastic leukemia)   . Hodgkin's disease   . Aortic stenosis   . Atrial fibrillation   . Coronary atherosclerosis of native coronary artery   . Hyperlipidemia   . Essential hypertension, benign   . BPH (benign prostatic hypertrophy)   . Elevated WBC count   . Arthritis   . Acid reflux   . Leukemia     Medications:     Vancomycin 2000mg  x 1 dose    Levquin 750mg  x dose    Zosyn 3.375gm x 1 dose  Assessment:    Coverage for possible urosepsis in compromised patient with CLL and Hx of Hodgkins lymphoma  Goal of Therapy:     Will provide initial doses of 3 antibiotics based on body mass and est. CrCl.  Clinical pharmacist will assess for maintenance regimens in morning.  Plan:  1. Initial dose Of IV Levaquin 750mg  x 1, followed by 2. Initial dose of Zosyn 3.375gm IV over 4hrs x 1, with 3.  Initial simultaneous dose of Vancomycin 2000mg  over 3 hrs   Latish Toutant, Shon Baton 08/02/2011,2:59 AM

## 2011-08-02 NOTE — Progress Notes (Addendum)
Patient had tachycardia into the 160s lasting about 10 minutes. EKG shows tachycardia of unknown type with chronic left bundle branch block. Telemetry looks like A. fib with a rate of about 100. Patient has previous history of a total fibrillation. Tachycardia likely related to infection, but could be related to IVIG which he is currently receiving. Blood pressure is not low. Temperature is 102.4. Patient has received Tylenol. She has no wheezing or chest pain. No rash. I've spoken with oncology who recommends giving IV Benadryl in addition to the Tylenol. Will check another EKG in the morning. Cc time 20 minutes

## 2011-08-02 NOTE — Progress Notes (Signed)
Chart reviewed. Patient admitted after midnight. Blood pressure 85/40. blood cell count a few months ago was 75,000. Patient feels weak. Has erythema and warmth along the medial right thigh. This is the likely etiology of his fever. Will stop Levaquin and Zosyn. Continue vancomycin and add cefepime. I'm concerned about sepsis. Will bolus saline. Change to step down status. Change to inpatient status. Patient will likely need IVIG. Apparently he has a history of hypogammaglobulinemia. Stop Lovenox and aspirin in the setting of thrombocytopenia to avoid bleeding. Sequential compression devices. Check pro calcitonin and lactate acid level. Stop metoprolol. I have spoken to oncology who will consult. Critical care time 30 minutes.

## 2011-08-02 NOTE — Consult Note (Signed)
[facility] Consultation Oncology  Name: [name]      MRN: [id number]    Location: IC09/IC09-01  Date: 08/02/2011 Time:4:29 PM   REFERRING PHYSICIAN:  Crista Curb, MD  REASON FOR CONSULT:   Unspecified fever of unknown etiology in patient with CLL, H/O Hodgkin's Disease, and hypogammaglobulinemia.   HISTORY OF PRESENT ILLNESS:   [name] is a 66 year old Caucasian gentleman who is well known to the [facility] for his recurrent CLL which will likely require treatment in the future and history of Hodgkin's Disease. He is known to have severe hypogammaglobulinemia.  It should be noted that the patient is not under active treatment.   [name] presented to the ED with fevers.  He reports temperatures of 102.5 and 101.4 at home to the ED physician.  He was started on empiric septic protocol including antibiotics.  He is presently on Vancomycin and Cefepime IV.  Since admission his Tmax was 100.2.  He has been hypotensive with the lowest BP of 85/50.  Most recently, BP was 103/58.  The patient reports that yesterday he was doing fine until lunch time when he started to feel feverish with chills, fatigue, and generalized poor feeling.  He checked his temperature a few time yesterday and the temperatures are noted above. He then reported to the ED last night.   Due to hypogammaglobulinemia, we have already ordered IVIg 1 g/kg x 2 doses.  He already has Tylenol ordered.  This can be used to pre-medicate.  Dr. Lendell Caprice contacted me regarding his tachycardia that occurred during the infusion.  He has a history of A-fib.  I recommended IV Benadryl and re-initiation of IVIg when stable.  The patient denies any headaches, dizziness, double vision, night sweats, nausea, vomiting, diarrhea, constipation, abdominal pain, black tarry stool, blood in stool, urinary complaints.   PAST MEDICAL HISTORY:   Past Medical History  Diagnosis Date  . Aortic stenosis   . Atrial  fibrillation   . Coronary atherosclerosis of native coronary artery   . Hyperlipidemia   . Essential hypertension, benign   . BPH (benign prostatic hypertrophy)   . Elevated WBC count   . Arthritis   . Acid reflux   . CLL (chronic lymphoblastic leukemia)   . Hodgkin's disease   . Leukemia     ALLERGIES: No Known Allergies    MEDICATIONS: I have reviewed the patient's current medications.     PAST SURGICAL HISTORY Past Surgical History  Procedure Date  . Eye surgery   . Aortic valve replacement 10/09    23mm Magna Pericardial   . Esophagogastroduodenoscopy     scrapping of throat and stretching  . Lithotripsy   . Bone marrow aspiration   . Portacath placement   . Colon surgery   . Coronary artery bypass graft 10/09    SVG to RCA    FAMILY HISTORY: Family History  Problem Relation Age of Onset  . Heart failure Mother   . Blindness Mother   . Cancer Father     Lung     SOCIAL HISTORY:  reports that he has never smoked. He has never used smokeless tobacco. He reports that he does not drink alcohol or use illicit drugs.  PERFORMANCE STATUS: The patient's performance status is 2 - Symptomatic, <50% confined to bed  PHYSICAL EXAM: Most Recent Vital Signs: Blood pressure 103/58, pulse 79, temperature 100.2 F (37.9 C), temperature source Oral, resp. rate 20, height 5\' 10"  (1.778  m), weight 252 lb 6.8 oz (114.5 kg), SpO2 99.00%. General appearance: alert, cooperative, appears stated age and no distress Head: Normocephalic, without obvious abnormality, atraumatic Lungs: clear to auscultation bilaterally Heart: regular rate and rhythm, S1, S2 normal, no murmur, click, rub or gallop Abdomen: soft, non-tender; bowel sounds normal; no masses,  no organomegaly Extremities: right medial thigh erythema, outlined with marker. Pulses: 2+ and symmetric Skin: Skin color, texture, turgor normal. No rashes or lesions Lymph nodes: no inguinal nodes appreciated Neurologic: Grossly  normal  LABORATORY DATA:  Results for orders placed during the hospital encounter of 08/02/11 (from the past 48 hour(s))  URINALYSIS, ROUTINE W REFLEX MICROSCOPIC     Status: Abnormal   Collection Time   08/02/11 12:20 AM      Component Value Range Comment   Color, Urine YELLOW  YELLOW     APPearance CLEAR  CLEAR     Specific Gravity, Urine 1.015  1.005 - 1.030     pH 6.0  5.0 - 8.0     Glucose, UA NEGATIVE  NEGATIVE (mg/dL)    Hgb urine dipstick TRACE (*) NEGATIVE     Bilirubin Urine NEGATIVE  NEGATIVE     Ketones, ur NEGATIVE  NEGATIVE (mg/dL)    Protein, ur 30 (*) NEGATIVE (mg/dL)    Urobilinogen, UA 2.0 (*) 0.0 - 1.0 (mg/dL)    Nitrite NEGATIVE  NEGATIVE     Leukocytes, UA SMALL (*) NEGATIVE    URINE MICROSCOPIC-ADD ON     Status: Abnormal   Collection Time   08/02/11 12:20 AM      Component Value Range Comment   Squamous Epithelial / LPF FEW (*) RARE     WBC, UA 0-2  <3 (WBC/hpf)    RBC / HPF 3-6  <3 (RBC/hpf)    Bacteria, UA FEW (*) RARE    CBC     Status: Abnormal   Collection Time   08/02/11 12:21 AM      Component Value Range Comment   WBC 125.8 (*) 4.0 - 10.5 (K/uL)    RBC 2.91 (*) 4.22 - 5.81 (MIL/uL)    Hemoglobin 10.5 (*) 13.0 - 17.0 (g/dL)    HCT 96.0 (*) 45.4 - 52.0 (%)    MCV 107.9 (*) 78.0 - 100.0 (fL)    MCH 36.1 (*) 26.0 - 34.0 (pg)    MCHC 33.4  30.0 - 36.0 (g/dL)    RDW 09.8 (*) 11.9 - 15.5 (%)    Platelets 63 (*) 150 - 400 (K/uL)   DIFFERENTIAL     Status: Abnormal   Collection Time   08/02/11 12:21 AM      Component Value Range Comment   Neutrophils Relative 3 (*) 43 - 77 (%)    Lymphocytes Relative 95 (*) 12 - 46 (%)    Monocytes Relative 1 (*) 3 - 12 (%)    Eosinophils Relative 1  0 - 5 (%)    Basophils Relative 0  0 - 1 (%)    Band Neutrophils 0  0 - 10 (%)    Metamyelocytes Relative 0      Myelocytes 0      Promyelocytes Absolute 0      Blasts 0      nRBC 0  0 (/100 WBC)    Neutro Abs 3.8  1.7 - 7.7 (K/uL)    Lymphs Abs 119.4 (*) 0.7 - 4.0  (K/uL)    Monocytes Absolute 1.3 (*) 0.1 - 1.0 (K/uL)  Eosinophils Absolute 1.3 (*) 0.0 - 0.7 (K/uL)    Basophils Absolute 0.0  0.0 - 0.1 (K/uL)   COMPREHENSIVE METABOLIC PANEL     Status: Abnormal   Collection Time   08/02/11 12:21 AM      Component Value Range Comment   Sodium 136  135 - 145 (mEq/L)    Potassium 4.1  3.5 - 5.1 (mEq/L)    Chloride 101  96 - 112 (mEq/L)    CO2 22  19 - 32 (mEq/L)    Glucose, Bld 150 (*) 70 - 99 (mg/dL)    BUN 21  6 - 23 (mg/dL)    Creatinine, Ser 1.61  0.50 - 1.35 (mg/dL)    Calcium 8.9  8.4 - 10.5 (mg/dL)    Total Protein 5.8 (*) 6.0 - 8.3 (g/dL)    Albumin 3.8  3.5 - 5.2 (g/dL)    AST 37  0 - 37 (U/L)    ALT 29  0 - 53 (U/L)    Alkaline Phosphatase 110  39 - 117 (U/L)    Total Bilirubin 1.0  0.3 - 1.2 (mg/dL)    GFR calc non Af Amer 59 (*) >90 (mL/min)    GFR calc Af Amer 68 (*) >90 (mL/min)   HEMOGLOBIN A1C     Status: Abnormal   Collection Time   08/02/11 12:21 AM      Component Value Range Comment   Hemoglobin A1C 6.3 (*) <5.7 (%)    Mean Plasma Glucose 134 (*) <117 (mg/dL)   HEPATIC FUNCTION PANEL     Status: Abnormal   Collection Time   08/02/11 12:21 AM      Component Value Range Comment   Total Protein 6.1  6.0 - 8.3 (g/dL)    Albumin 3.8  3.5 - 5.2 (g/dL)    AST 38 (*) 0 - 37 (U/L)    ALT 29  0 - 53 (U/L)    Alkaline Phosphatase 110  39 - 117 (U/L)    Total Bilirubin 0.9  0.3 - 1.2 (mg/dL)    Bilirubin, Direct 0.2  0.0 - 0.3 (mg/dL)    Indirect Bilirubin 0.7  0.3 - 0.9 (mg/dL)   TSH     Status: Normal   Collection Time   08/02/11 12:21 AM      Component Value Range Comment   TSH 1.490  0.350 - 4.500 (uIU/mL)   CULTURE, BLOOD (ROUTINE X 2)     Status: Normal (Preliminary result)   Collection Time   08/02/11  1:47 AM      Component Value Range Comment   Specimen Description Blood RIGHT ANTECUBITAL      Special Requests BOTTLES DRAWN AEROBIC AND ANAEROBIC 8CC      Culture NO GROWTH <24 HRS      Report Status PENDING     CULTURE,  BLOOD (ROUTINE X 2)     Status: Normal (Preliminary result)   Collection Time   08/02/11  1:51 AM      Component Value Range Comment   Specimen Description Blood BLOOD RIGHT HAND      Special Requests BOTTLES DRAWN AEROBIC AND ANAEROBIC 6CC      Culture NO GROWTH <24 HRS      Report Status PENDING     MRSA PCR SCREENING     Status: Normal   Collection Time   08/02/11  3:13 AM      Component Value Range Comment   MRSA by PCR NEGATIVE  NEGATIVE  CBC     Status: Abnormal   Collection Time   08/02/11  4:22 AM      Component Value Range Comment   WBC 105.2 (*) 4.0 - 10.5 (K/uL)    RBC 2.68 (*) 4.22 - 5.81 (MIL/uL)    Hemoglobin 9.5 (*) 13.0 - 17.0 (g/dL)    HCT 16.1 (*) 09.6 - 52.0 (%)    MCV 108.6 (*) 78.0 - 100.0 (fL)    MCH 35.4 (*) 26.0 - 34.0 (pg)    MCHC 32.6  30.0 - 36.0 (g/dL)    RDW 04.5 (*) 40.9 - 15.5 (%)    Platelets 54 (*) 150 - 400 (K/uL)   COMPREHENSIVE METABOLIC PANEL     Status: Abnormal   Collection Time   08/02/11  4:22 AM      Component Value Range Comment   Sodium 137  135 - 145 (mEq/L)    Potassium 4.0  3.5 - 5.1 (mEq/L)    Chloride 102  96 - 112 (mEq/L)    CO2 24  19 - 32 (mEq/L)    Glucose, Bld 136 (*) 70 - 99 (mg/dL)    BUN 20  6 - 23 (mg/dL)    Creatinine, Ser 8.11  0.50 - 1.35 (mg/dL)    Calcium 8.6  8.4 - 10.5 (mg/dL)    Total Protein 5.5 (*) 6.0 - 8.3 (g/dL)    Albumin 3.3 (*) 3.5 - 5.2 (g/dL)    AST 28  0 - 37 (U/L)    ALT 25  0 - 53 (U/L)    Alkaline Phosphatase 92  39 - 117 (U/L)    Total Bilirubin 1.0  0.3 - 1.2 (mg/dL)    GFR calc non Af Amer 57 (*) >90 (mL/min)    GFR calc Af Amer 66 (*) >90 (mL/min)   PROCALCITONIN     Status: Normal   Collection Time   08/02/11 10:08 AM      Component Value Range Comment   Procalcitonin 2.83     LACTIC ACID, PLASMA     Status: Normal   Collection Time   08/02/11 10:08 AM      Component Value Range Comment   Lactic Acid, Venous 0.6  0.5 - 2.2 (mmol/L)       RADIOGRAPHY: Dg Chest 2 View  08/02/2011   *RADIOLOGY REPORT*  Clinical Data: Weakness, fever and shortness of breath.  CHEST - 2 VIEW  Comparison: Chest radiograph performed 05/11/2011  Findings: The lungs are well-aerated and clear.  There is no evidence of focal opacification, pleural effusion or pneumothorax.  The heart is normal in size; the patient is status post median sternotomy, with evidence of prior CABG.  An aortic valve replacement is noted.  A right subclavian line is noted ending about the mid to distal SVC.  No acute osseous abnormalities are seen.  IMPRESSION: No acute cardiopulmonary process seen.  Original Report Authenticated By: Tonia Ghent, M.D.      ASSESSMENT:  1. Right medial thigh cellulitis 2. Fevers of unknown origin, likely from #1 3. Recurrent chronic lymphocytic leukemia.  4. 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 He is in a complete remission from the Hodgkin's.  5. Status post fludarabine, Cytoxan, and rituximab therapy for 3 cycles when he presented with CLL in 2004.  6.  Obesity.  7. Insect bites, multiple places on his body.  8. Possible early onset diabetes mellitus with mild elevation in his sugars lately. We will check hemoglobin A1c next time he is here.  9. Pan-hypogammaglobulinemia.  10. Severe deconditioning.  11. History of Agent Orange exposure during the [place] War and he states that just within the last 2 months he has been granted full disability from the [place].  12.Coronary artery disease, status post bypass grafting x1 in October 2009.  13.Peripheral vascular disease.  14.Aortic stenosis with aortic valve replacement.  15.Benign prostatic hypertrophy.  16.Hyperlipidemia.  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  18.Circumcision in the past, leaving him with difficulty with a uniform  stream and occasional incontinence.  19.Thrombocytopenia persistent ever since his chemotherapy for his Hodgkin disease, but getting slightly worse.  PLAN:  1. IVIg 1 gram/kg today and tomorrow.  May pre-medicate with Tylenol and Benadryl at hospitalist's discretion 2. Agree with antibiotics  3. Will continue to follow while an inpatient.  More recommendations may follow per Dr. [name].  All questions were answered. The patient knows to call the clinic with any problems, questions or concerns. We can certainly see the patient much sooner if necessary.  The patient and plan discussed with Glenford Peers, MD and he is in agreement with the aforementioned.  Tyronda Vizcarrondo

## 2011-08-02 NOTE — ED Provider Notes (Addendum)
History     CSN: [id number]  Arrival date & time 08/01/11  2351   First MD Initiated Contact with Patient 08/02/11 0003      Chief Complaint  Patient presents with  . Fever    (Consider location/radiation/quality/duration/timing/severity/associated sxs/prior treatment) HPI [name] is a 66 y.o. male with a history of Hodgkin's disease, CLL, CAD, atrial fibrillation, aortic stenosis,who presents to the Emergency Department complaining of fever and chills that began today. States that he has taken his temperature twice this afternoon it was initially 101.4 and the second time was 102. He took Tylenol. He has recently been diagnosed with CLL is a relapse from his Hodgkin's disease of 2006 when he received both chemotherapy and radiation.  PCP Dr. Lodema Hong Hem/Onc Dr. [name]     Past Medical History  Diagnosis Date  . CLL (chronic lymphoblastic leukemia)   . Hodgkin's disease   . Aortic stenosis   . Atrial fibrillation   . Coronary atherosclerosis of native coronary artery   . Hyperlipidemia   . Essential hypertension, benign   . BPH (benign prostatic hypertrophy)   . Elevated WBC count   . Arthritis   . Acid reflux   . Leukemia     Past Surgical History  Procedure Date  . Eye surgery   . 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 Aortic valve replacement 10/09    23mm Magna Pericardial   . Esophagogastroduodenoscopy     scrapping of throat and stretching  . Lithotripsy   . Bone marrow aspiration   . Portacath placement   . Colon surgery     Family History  Problem Relation Age of Onset  . Heart failure Mother   . Blindness Mother   . Cancer Father     Lung     History  Substance Use Topics  . Smoking status: Never Smoker   . Smokeless tobacco: Never Used   Comment: pt denies tobacco use   . Alcohol Use: No     pt denies alcohol       Review of Systems  Constitutional: Positive for fever and chills.       10 Systems reviewed  and are negative for acute change except as noted in the HPI.  HENT: Negative for congestion.   Eyes: Negative for discharge and redness.  Respiratory: Negative for cough and shortness of breath.   Cardiovascular: Negative for chest pain.  Gastrointestinal: Negative for vomiting and abdominal pain.  Musculoskeletal: Negative for back pain.  Skin: Negative for rash.  Neurological: Positive for weakness. Negative for syncope, numbness and headaches.  Psychiatric/Behavioral:       No behavior change.    Allergies  Review of patient's allergies indicates no known allergies.  Home Medications   Current Outpatient Rx  Name Route Sig Dispense Refill  . ACETAMINOPHEN 500 MG PO TABS Oral Take 1,000 mg by mouth once as needed. For pain     . ACYCLOVIR 200 MG PO CAPS Oral Take 200 mg by mouth 2 (two) times daily. Take one tablet by mouth twice a day     . ASPIRIN 81 MG PO TBEC Oral Take 81 mg by mouth daily. Take one tablet by mouth daily     . FOLIC ACID 1 MG PO TABS  TAKE ONE TABLET BY MOUTH EVERY DAY 90 tablet 1  . METOPROLOL TARTRATE 50 MG PO TABS  TAKE ONE TABLET BY MOUTH TWICE DAILY 60 tablet 6  .  CENTRUM SILVER ULTRA MENS PO Oral Take 1 tablet by mouth daily.    Marland Kitchen OMEPRAZOLE 20 MG PO CPDR Oral Take 20 mg by mouth every morning.     Marland Kitchen ROSUVASTATIN CALCIUM 20 MG PO TABS Oral Take 1 tablet (20 mg total) by mouth at bedtime. 168 tablet 0  . OMEGA 3 1000 MG PO CAPS Oral Take 1,000 mg by mouth every morning.       BP 119/59  Pulse 97  Temp(Src) 99.6 F (37.6 C) (Oral)  Resp 20  Ht 5\' 10"  (1.778 m)  Wt 240 lb (108.863 kg)  BMI 34.44 kg/m2  SpO2 95%  Physical Exam  Nursing note and vitals reviewed. Constitutional: He is oriented to person, place, and time.       Awake, alert, nontoxic appearance.  HENT:  Head: Normocephalic and atraumatic.  Right Ear: External ear normal.  Left Ear: External ear normal.  Mouth/Throat: Oropharynx is clear and moist.  Eyes: Right eye exhibits no  discharge. Left eye exhibits no discharge.  Neck: Neck supple. No JVD present.  Cardiovascular: Normal rate and intact distal pulses.  Exam reveals gallop.   No murmur heard. Pulmonary/Chest: Effort normal and breath sounds normal. He exhibits no tenderness.       Port a cath in right chest wall  Abdominal: Soft. There is no tenderness. There is no rebound.  Musculoskeletal: He exhibits no tenderness.       Baseline ROM, no obvious new focal weakness.  Lymphadenopathy:    He has no cervical adenopathy.  Neurological: He is alert and oriented to person, place, and time.       Mental status and motor strength appears baseline for patient and situation.  Skin: No rash noted.  Psychiatric: He has a normal mood and affect.    ED Course  Procedures (including critical care time)  Results for orders placed during the hospital encounter of 08/02/11  URINALYSIS, ROUTINE W REFLEX MICROSCOPIC      Component Value Range   Color, Urine YELLOW  YELLOW    APPearance CLEAR  CLEAR    Specific Gravity, Urine 1.015  1.005 - 1.030    pH 6.0  5.0 - 8.0    Glucose, UA NEGATIVE  NEGATIVE (mg/dL)   Hgb urine dipstick TRACE (*) NEGATIVE    Bilirubin Urine NEGATIVE  NEGATIVE    Ketones, ur NEGATIVE  NEGATIVE (mg/dL)   Protein, ur 30 (*) NEGATIVE (mg/dL)   Urobilinogen, UA 2.0 (*) 0.0 - 1.0 (mg/dL)   Nitrite NEGATIVE  NEGATIVE    Leukocytes, UA SMALL (*) NEGATIVE   CBC      Component Value Range   WBC 125.8 (*) 4.0 - 10.5 (K/uL)   RBC 2.91 (*) 4.22 - 5.81 (MIL/uL)   Hemoglobin 10.5 (*) 13.0 - 17.0 (g/dL)   HCT 04.5 (*) 40.9 - 52.0 (%)   MCV 107.9 (*) 78.0 - 100.0 (fL)   MCH 36.1 (*) 26.0 - 34.0 (pg)   MCHC 33.4  30.0 - 36.0 (g/dL)   RDW 81.1 (*) 91.4 - 15.5 (%)   Platelets 63 (*) 150 - 400 (K/uL)  DIFFERENTIAL      Component Value Range   Neutrophils Relative 3 (*) 43 - 77 (%)   Lymphocytes Relative 95 (*) 12 - 46 (%)   Monocytes Relative 1 (*) 3 - 12 (%)   Eosinophils Relative 1  0 - 5 (%)     Basophils Relative 0  0 - 1 (%)  Band Neutrophils 0  0 - 10 (%)   Metamyelocytes Relative 0     Myelocytes 0     Promyelocytes Absolute 0     Blasts 0     nRBC 0  0 (/100 WBC)   Neutro Abs 3.8  1.7 - 7.7 (K/uL)   Lymphs Abs 119.4 (*) 0.7 - 4.0 (K/uL)   Monocytes Absolute 1.3 (*) 0.1 - 1.0 (K/uL)   Eosinophils Absolute 1.3 (*) 0.0 - 0.7 (K/uL)   Basophils Absolute 0.0  0.0 - 0.1 (K/uL)  COMPREHENSIVE METABOLIC PANEL      Component Value Range   Sodium 136  135 - 145 (mEq/L)   Potassium 4.1  3.5 - 5.1 (mEq/L)   Chloride 101  96 - 112 (mEq/L)   CO2 22  19 - 32 (mEq/L)   Glucose, Bld 150 (*) 70 - 99 (mg/dL)   BUN 21  6 - 23 (mg/dL)   Creatinine, Ser 0.98  0.50 - 1.35 (mg/dL)   Calcium 8.9  8.4 - 11.9 (mg/dL)   Total Protein 5.8 (*) 6.0 - 8.3 (g/dL)   Albumin 3.8  3.5 - 5.2 (g/dL)   AST 37  0 - 37 (U/L)   ALT 29  0 - 53 (U/L)   Alkaline Phosphatase 110  39 - 117 (U/L)   Total Bilirubin 1.0  0.3 - 1.2 (mg/dL)   GFR calc non Af Amer 59 (*) >90 (mL/min)   GFR calc Af Amer 68 (*) >90 (mL/min)  URINE MICROSCOPIC-ADD ON      Component Value Range   Squamous Epithelial / LPF FEW (*) RARE    WBC, UA 0-2  <3 (WBC/hpf)   RBC / HPF 3-6  <3 (RBC/hpf)   Bacteria, UA FEW (*) RARE     Dg Chest 2 View  08/02/2011  *RADIOLOGY REPORT*  Clinical Data: Weakness, fever and shortness of breath.  CHEST - 2 VIEW  Comparison: Chest radiograph performed 05/11/2011  Findings: The lungs are well-aerated and clear.  There is no evidence of focal opacification, pleural effusion or pneumothorax.  The heart is normal in size; the patient is status post median sternotomy, with evidence of prior CABG.  An aortic valve replacement is noted.  A right subclavian line is noted ending about the mid to distal SVC.  No acute osseous abnormalities are seen.  IMPRESSION: No acute cardiopulmonary process seen.  Original Report Authenticated By: Tonia Ghent, M.D.     1:42 AM:  T/C t oDr. Orvan Falconer, hospitalist case  discussed, including:  HPI, pertinent PM/SHx, VS/PE, dx testing, ED course and treatment.  Recommendation to obtain blood cultures. D/C home with follow up Dr. Lodema Hong and Dr. [name].  1:51 AM:  T/C from Dr. Orvan Falconer, hospitalist. He has reviewed the recoords and recommends observation admission with opportunity for Dr. [name] to see him while in the hospital. .  Agreeable to admit for obs.   Requests to write temporary orders,  Med surg bed.  MDM  Patient with history of Hodgkin's disease now with CLL and rising white count. Vision is followed by Dr. [name] who has suggested treatment might begin should white count continued to rise. Patient had fever today at home but has had no fever since arrival in the ER. Hemoglobin is stable, platelets are low but unchanged, chest x-ray is negative, urine is normal. Consultation with Dr. Orvan Falconer, hospitalist. Maryclare Labrador obtain blood cultures which will be pending. Patient to be admitted to obs. Pt stable in ED with no significant deterioration  in condition.The patient appears reasonably stabilized for admission considering the current resources, flow, and capabilities available in the ED at this time, and I doubt any other [facility] requiring further screening and/or treatment in the ED prior to admission. MDM Reviewed: nursing note and vitals Reviewed previous: labs and x-ray Interpretation: labs and x-ray           Nicoletta Dress. Colon Branch, MD 08/02/11 0147  Nicoletta Dress. Colon Branch, MD 08/02/11 2130

## 2011-08-03 DIAGNOSIS — L03039 Cellulitis of unspecified toe: Secondary | ICD-10-CM

## 2011-08-03 DIAGNOSIS — A413 Sepsis due to Hemophilus influenzae: Secondary | ICD-10-CM

## 2011-08-03 DIAGNOSIS — C9112 Chronic lymphocytic leukemia of B-cell type in relapse: Secondary | ICD-10-CM

## 2011-08-03 DIAGNOSIS — D801 Nonfamilial hypogammaglobulinemia: Secondary | ICD-10-CM

## 2011-08-03 DIAGNOSIS — L02619 Cutaneous abscess of unspecified foot: Secondary | ICD-10-CM

## 2011-08-03 DIAGNOSIS — R000 Tachycardia, unspecified: Secondary | ICD-10-CM | POA: Diagnosis present

## 2011-08-03 LAB — CBC
HCT: 26.5 % — ABNORMAL LOW (ref 39.0–52.0)
Hemoglobin: 8.7 g/dL — ABNORMAL LOW (ref 13.0–17.0)
MCH: 35.5 pg — ABNORMAL HIGH (ref 26.0–34.0)
MCHC: 32.8 g/dL (ref 30.0–36.0)
MCV: 108.2 fL — ABNORMAL HIGH (ref 78.0–100.0)
RDW: 16.4 % — ABNORMAL HIGH (ref 11.5–15.5)

## 2011-08-03 LAB — BASIC METABOLIC PANEL
BUN: 19 mg/dL (ref 6–23)
Calcium: 8.3 mg/dL — ABNORMAL LOW (ref 8.4–10.5)
Creatinine, Ser: 1.32 mg/dL (ref 0.50–1.35)
GFR calc Af Amer: 63 mL/min — ABNORMAL LOW (ref >90)
GFR calc non Af Amer: 54 mL/min — ABNORMAL LOW (ref >90)
Glucose, Bld: 125 mg/dL — ABNORMAL HIGH (ref 70–99)
Potassium: 3.9 mEq/L (ref 3.5–5.1)

## 2011-08-03 MED ORDER — POTASSIUM CHLORIDE IN NACL 20-0.9 MEQ/L-% IV SOLN
INTRAVENOUS | Status: DC
  Administered 2011-08-03 – 2011-08-04 (×2): via INTRAVENOUS

## 2011-08-03 MED ORDER — SODIUM CHLORIDE 0.9 % IJ SOLN
INTRAMUSCULAR | Status: AC
  Administered 2011-08-03: 16:00:00
  Filled 2011-08-03: qty 3

## 2011-08-03 MED ORDER — METOPROLOL TARTRATE 25 MG PO TABS
12.5000 mg | ORAL_TABLET | Freq: Two times a day (BID) | ORAL | Status: DC
  Administered 2011-08-03 (×2): 12.5 mg via ORAL
  Filled 2011-08-03 (×2): qty 1

## 2011-08-03 NOTE — Progress Notes (Signed)
Tylenol 650mg  po given prophylactically when IVIG started

## 2011-08-03 NOTE — Progress Notes (Signed)
Subjective: [name] is doing well.  He ate all of his breakfast.  He denies any fevers, chills, or night sweats over night.  He explains that he slept better when he bed was positioned in a more supine position.   He explains that he feels well.  No nausea or vomiting.  Co constipation.   Objective: Vital signs in last 24 hours: Temp:  [98.6 F (37 C)-102.4 F (39.1 C)] 98.7 F (37.1 C) (05/07 0800) Pulse Rate:  [74-143] 86  (05/07 0600) Resp:  [19-31] 24  (05/07 0600) BP: (88-124)/(47-71) 124/63 mmHg (05/07 0600) SpO2:  [94 %-100 %] 98 % (05/07 0600) Weight:  [263 lb 0.1 oz (119.3 kg)] 263 lb 0.1 oz (119.3 kg) (05/07 0500)  Intake/Output from previous day: 05/06 0800 - 05/07 0759 In: 2500 [I.V.:2400; IV Piggyback:100] Out: 1325 [Urine:1325] Intake/Output this shift: Total I/O In: -  Out: 900 [Urine:900]  General appearance: alert, cooperative and no distress Extremities: right medial upper leg erythema/cellulitis appreciated.  It remains within the confines of the outlined marker.  Warm to the touch. Non tender.  Lab Results:   Basename 08/03/11 0444 08/02/11 0422  WBC 64.1* 105.2*  HGB 8.7* 9.5*  HCT 26.5* 29.1*  PLT 42* 54*   BMET  Basename 08/03/11 0444 08/02/11 0422  NA 135 137  K 3.9 4.0  CL 108 102  CO2 21 24  GLUCOSE 125* 136*  BUN 19 20  CREATININE 1.32 1.26  CALCIUM 8.3* 8.6    Studies/Results: Dg Chest 2 View  08/02/2011  *RADIOLOGY REPORT*  Clinical Data: Weakness, fever and shortness of breath.  CHEST - 2 VIEW  Comparison: Chest radiograph performed 05/11/2011  Findings: The lungs are well-aerated and clear.  There is no evidence of focal opacification, pleural effusion or pneumothorax.  The heart is normal in size; the patient is status post median sternotomy, with evidence of prior CABG.  An aortic valve replacement is noted.  A right subclavian line is noted ending about the mid to distal SVC.  No acute osseous abnormalities are seen.  IMPRESSION:  No acute cardiopulmonary process seen.  Original Report Authenticated By: Tonia Ghent, M.D.    Medications: I have reviewed the patient's current medications.  Assessment/Plan: 1. Right medial thigh cellulitis.  He denies any insect bites, including mosquito and spider bites. S/P 1 dose of IVIg on 08/02/2011 and he is scheduled for his second dose today 08/03/2011.  Recommend pre-medication with Tylenol and Benadryl.  Continue antibiotics.  Will continue to follow while an inpatient. 2. Fevers of unknown origin, likely from #1  3. Recurrent chronic lymphocytic leukemia. Not under active treatment. 4. 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 He is in a complete remission from the Hodgkin's.  5. Status post fludarabine, Cytoxan, and rituximab therapy for 3 cycles when he presented with CLL in 2004.  6. Obesity.   7. Possible early onset diabetes mellitus with mild elevation in his sugars lately. We will check hemoglobin A1c next time he is in in the clinic.  8. Pan-hypogammaglobulinemia.  9. Severe deconditioning.  10. History of Agent Orange exposure during the [place] War and he states that just within the last 2 months he has been granted full disability from the [place].  11.Coronary artery disease, status post bypass grafting x1 in October  2009.  12.Peripheral vascular disease.  13.Aortic stenosis with aortic valve replacement.  14.Benign prostatic hypertrophy.  15.Hyperlipidemia.  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  17.Circumcision in the past, leaving him with difficulty with a uniform stream and occasional incontinence.  18.Thrombocytopenia persistent ever since his chemotherapy for his Hodgkin disease, but getting slightly worse.       LOS: 1 day     [name] 08/03/2011

## 2011-08-03 NOTE — Progress Notes (Signed)
ANTIBIOTIC CONSULT NOTE   Pharmacy Consult for Vancomycin and Cefepime Indication: sepsis, fevers  No Known Allergies  Patient Measurements: Height: 5\' 10"  (177.8 cm) Weight: 263 lb 0.1 oz (119.3 kg) IBW/kg (Calculated) : 73   Vital Signs: Temp: 98.7 F (37.1 C) (05/07 0800) Temp src: Oral (05/07 0800) BP: 109/55 mmHg (05/07 0900) Pulse Rate: 89  (05/07 0900) Intake/Output from previous day: 05/06 0701 - 05/07 0700 In: 2600 [I.V.:2500; IV Piggyback:100] Out: 1325 [Urine:1325] Intake/Output from this shift: Total I/O In: 560 [P.O.:360; I.V.:200] Out: 900 [Urine:900]  Labs:  [facility] 08/03/11 0444 08/02/11 0422 08/02/11 0021  WBC 64.1* 105.2* 125.8*  HGB 8.7* 9.5* 10.5*  PLT 42* 54* 63*  LABCREA -- -- --  CREATININE 1.32 1.26 1.23   Estimated Creatinine Clearance: 70.3 ml/min (by C-G formula based on Cr of 1.32). No results found for this basename: VANCOTROUGH:2,VANCOPEAK:2,VANCORANDOM:2,GENTTROUGH:2,GENTPEAK:2,GENTRANDOM:2,TOBRATROUGH:2,TOBRAPEAK:2,TOBRARND:2,AMIKACINPEAK:2,AMIKACINTROU:2,AMIKACIN:2, in the last 72 hours   Microbiology: Recent Results (from the past 720 hour(s))  CULTURE, BLOOD (ROUTINE X 2)     Status: Normal (Preliminary result)   Collection Time   08/02/11  1:47 AM      Component Value Range Status Comment   Specimen Description BLOOD RIGHT ANTECUBITAL   Final    Special Requests BOTTLES DRAWN AEROBIC AND ANAEROBIC 8CC   Final    Culture NO GROWTH 1 DAY   Final    Report Status PENDING   Incomplete   CULTURE, BLOOD (ROUTINE X 2)     Status: Normal (Preliminary result)   Collection Time   08/02/11  1:51 AM      Component Value Range Status Comment   Specimen Description BLOOD RIGHT HAND   Final    Special Requests BOTTLES DRAWN AEROBIC AND ANAEROBIC 6CC   Final    Culture NO GROWTH 1 DAY   Final    Report Status PENDING   Incomplete   MRSA PCR SCREENING     Status: Normal   Collection Time   08/02/11  3:13 AM      Component Value Range Status  Comment   MRSA by PCR NEGATIVE  NEGATIVE  Final    Medical History: Past Medical History  Diagnosis Date  . Aortic stenosis   . Atrial fibrillation   . Coronary atherosclerosis of native coronary artery   . Hyperlipidemia   . Essential hypertension, benign   . BPH (benign prostatic hypertrophy)   . Elevated WBC count   . Arthritis   . Acid reflux   . CLL (chronic lymphoblastic leukemia)   . Hodgkin's disease   . Leukemia    Medications:  Scheduled:     . acyclovir  200 mg Oral BID  . atorvastatin  40 mg Oral q1800  . ceFEPime (MAXIPIME) IV  1 g Intravenous Q12H  . diphenhydrAMINE  50 mg Intravenous Once  . folic acid  1 mg Oral Daily  . IMMUNE GLOBLULIN (HUMAN) IV  1 g/kg Intravenous Q24H  . metoprolol tartrate  12.5 mg Oral BID  . pantoprazole  40 mg Oral Q1200  . sodium chloride  500 mL Intravenous Once  . sodium chloride      . sodium chloride      . vancomycin  1,000 mg Intravenous Q12H   Assessment: Pt with CLL presents with fevers and elevated WBC (last known level =76K in 05/2011) started on empiric broad-spectrum antibiotics with Vanc, Levaquin, and Zoysn. Renal function stable.  Cx data pending. Levaquin and Zosyn subsequently d/c'd and Cefepime started Worsening thrombocytopenia  Goal of Therapy:  Vancomycin trough level 15-20 mcg/ml  Plan:  1) Vancomcyin 1000mg  IV q12h 2) Cefepime 1gm iv q12hrs 3) Vanc trough level tomorrow 4) Monitor renal function and patient progress, CBC  Dael Howland A, PHARMD 08/03/2011,10:42 AM

## 2011-08-03 NOTE — Progress Notes (Signed)
IVIG INFUSION HAS COMPLETED. NO SX OF ADVERSE REACTION. SON VISITING AT BESIDE.

## 2011-08-03 NOTE — Progress Notes (Signed)
Subjective: The patient is feeling some better. He has no complaints of flank pain or pain with urination. He has no complaints of chest pain or shortness of breath.  Objective: Vital signs in last 24 hours: Filed Vitals:   08/03/11 0400 08/03/11 0500 08/03/11 0600 08/03/11 0800  BP: 113/65 118/61 124/63   Pulse: 81 84 86   Temp: 98.8 F (37.1 C)   98.7 F (37.1 C)  TempSrc: Oral   Oral  Resp: 22 22 24    Height:      Weight:  119.3 kg (263 lb 0.1 oz)    SpO2: 98% 97% 98%     Intake/Output Summary (Last 24 hours) at 08/03/11 0852 Last data filed at 08/03/11 1610  Gross per 24 hour  Intake   2500 ml  Output   1925 ml  Net    575 ml    Weight change: 10.437 kg (23 lb 0.1 oz)  Physical exam: General: 66 year-old man lying in bed, in no acute distress. Heart: S1, S2, with a soft systolic murmur. Lungs: Decreased breath sounds in the bases, otherwise clear. Abdomen: Mildly obese, positive bowel sounds, soft, nontender, nondistended. Extremities: No pedal edema. Skin: Large area of cellulitis, mild to moderate erythema on the anterior surface of the right thigh. Nontender.  Lab Results: Basic Metabolic Panel:  Basename 08/03/11 0444 08/02/11 0422  NA 135 137  K 3.9 4.0  CL 108 102  CO2 21 24  GLUCOSE 125* 136*  BUN 19 20  CREATININE 1.32 1.26  CALCIUM 8.3* 8.6  MG -- --  PHOS -- --   Liver Function Tests:  Basename 08/02/11 0422 08/02/11 0021  AST 28 3738*  ALT 25 2929  ALKPHOS 92 110110  BILITOT 1.0 1.00.9  PROT 5.5* 5.8*6.1  ALBUMIN 3.3* 3.83.8   No results found for this basename: LIPASE:2,AMYLASE:2 in the last 72 hours No results found for this basename: AMMONIA:2 in the last 72 hours CBC:  Basename 08/03/11 0444 08/02/11 0422 08/02/11 0021  WBC 64.1* 105.2* --  NEUTROABS -- -- 3.8  HGB 8.7* 9.5* --  HCT 26.5* 29.1* --  MCV 108.2* 108.6* --  PLT 42* 54* --   Cardiac Enzymes: No results found for this basename:  CKTOTAL:3,CKMB:3,CKMBINDEX:3,TROPONINI:3 in the last 72 hours BNP: No results found for this basename: PROBNP:3 in the last 72 hours D-Dimer: No results found for this basename: DDIMER:2 in the last 72 hours CBG: No results found for this basename: GLUCAP:6 in the last 72 hours Hemoglobin A1C:  Basename 08/02/11 0021  HGBA1C 6.3*   Fasting Lipid Panel: No results found for this basename: CHOL,HDL,LDLCALC,TRIG,CHOLHDL,LDLDIRECT in the last 72 hours Thyroid Function Tests:  Basename 08/02/11 0021  TSH 1.490  T4TOTAL --  FREET4 --  T3FREE --  THYROIDAB --   Anemia Panel: No results found for this basename: VITAMINB12,FOLATE,FERRITIN,TIBC,IRON,RETICCTPCT in the last 72 hours Coagulation: No results found for this basename: LABPROT:2,INR:2 in the last 72 hours Urine Drug Screen: Drugs of Abuse  No results found for this basename: labopia, cocainscrnur, labbenz, amphetmu, thcu, labbarb    Alcohol Level: No results found for this basename: ETH:2 in the last 72 hours Urinalysis:  Basename 08/02/11 0020  COLORURINE YELLOW  LABSPEC 1.015  PHURINE 6.0  GLUCOSEU NEGATIVE  HGBUR TRACE*  BILIRUBINUR NEGATIVE  KETONESUR NEGATIVE  PROTEINUR 30*  UROBILINOGEN 2.0*  NITRITE NEGATIVE  LEUKOCYTESUR SMALL*   Misc. Labs:   Micro: Recent Results (from the past 240 hour(s))  CULTURE, BLOOD (ROUTINE X  2)     Status: Normal (Preliminary result)   Collection Time   08/02/11  1:47 AM      Component Value Range Status Comment   Specimen Description Blood RIGHT ANTECUBITAL   Final    Special Requests BOTTLES DRAWN AEROBIC AND ANAEROBIC 8CC   Final    Culture NO GROWTH <24 HRS   Final    Report Status PENDING   Incomplete   CULTURE, BLOOD (ROUTINE X 2)     Status: Normal (Preliminary result)   Collection Time   08/02/11  1:51 AM      Component Value Range Status Comment   Specimen Description Blood BLOOD RIGHT HAND   Final    Special Requests BOTTLES DRAWN AEROBIC AND ANAEROBIC 6CC    Final    Culture NO GROWTH <24 HRS   Final    Report Status PENDING   Incomplete   MRSA PCR SCREENING     Status: Normal   Collection Time   08/02/11  3:13 AM      Component Value Range Status Comment   MRSA by PCR NEGATIVE  NEGATIVE  Final     Studies/Results: Dg Chest 2 View  08/02/2011  *RADIOLOGY REPORT*  Clinical Data: Weakness, fever and shortness of breath.  CHEST - 2 VIEW  Comparison: Chest radiograph performed 05/11/2011  Findings: The lungs are well-aerated and clear.  There is no evidence of focal opacification, pleural effusion or pneumothorax.  The heart is normal in size; the patient is status post median sternotomy, with evidence of prior CABG.  An aortic valve replacement is noted.  A right subclavian line is noted ending about the mid to distal SVC.  No acute osseous abnormalities are seen.  IMPRESSION: No acute cardiopulmonary process seen.  Original Report Authenticated By: Tonia Ghent, M.D.    Medications:  Scheduled:   . acyclovir  200 mg Oral BID  . atorvastatin  40 mg Oral q1800  . ceFEPime (MAXIPIME) IV  1 g Intravenous Q12H  . diphenhydrAMINE  50 mg Intravenous Once  . folic acid  1 mg Oral Daily  . IMMUNE GLOBLULIN (HUMAN) IV  1 g/kg Intravenous Q24H  . pantoprazole  40 mg Oral Q1200  . sodium chloride  500 mL Intravenous Once  . sodium chloride  500 mL Intravenous Once  . sodium chloride      . sodium chloride      . vancomycin  1,000 mg Intravenous Q12H  . DISCONTD: sodium chloride   Intravenous STAT  . DISCONTD: aspirin EC  81 mg Oral Daily  . DISCONTD: atorvastatin  40 mg Oral q1800  . DISCONTD: enoxaparin  40 mg Subcutaneous Q24H  . DISCONTD: folic acid  1 mg Oral Daily  . DISCONTD: levofloxacin (LEVAQUIN) IV  750 mg Intravenous Q24H  . DISCONTD: metoprolol  50 mg Oral BID  . DISCONTD: piperacillin-tazobactam (ZOSYN)  IV  3.375 g Intravenous Q8H  . DISCONTD: vancomycin  1,500 mg Intravenous Q24H   Continuous:   . sodium chloride 0.9 % 1,000 mL  with potassium chloride 20 mEq infusion 100 mL/hr at 08/03/11 0600  . DISCONTD: diltiazem (CARDIZEM) infusion     UJW:[id number], bisacodyl, ondansetron (ZOFRAN) IV, ondansetron, sodium phosphate, traZODone, DISCONTD: acetaminophen  Assessment: Active Problems:  CLL  HYPERLIPIDEMIA  OBESITY  CORONARY ATHEROSCLEROSIS NATIVE CORONARY ARTERY  Atrial fibrillation  FEVER UNSPECIFIED  Essential hypertension, benign  GERD (gastroesophageal reflux disease)  Thrombocytopenia  Hypogammaglobulinemia, acquired  Anemia  Tachyarrhythmia   1. Right lower  extremity cellulitis. He is on vancomycin and cefepime. Will continue. This is likely the source of his fever versus fever from recurrent CLL.  2. Possible early sepsis versus systemic inflammatory response syndrome. On antibiotics above. His blood pressure is better.  3. Leukocytosis secondary to recurrent CLL and hypogammaglobulinemia. He is on IVIG per the oncology team.  4. Thrombocytopenia, secondary to #3. 5. Anemia, secondary to #3.  6. Tachyarrhythmia with a history of atrial fibrillation. Status post IV Cardizem. His heart rate is now controlled. His EKG this morning reveals sinus arrhythmia with first degree AV block and a heart rate of 86 beats per minute.    Plan: 1. Now that his blood pressure is better, will restart small dosing of metoprolol with parameters. 2. Out of bed to the chair. 3. IVIG per the oncology team. 4. Daily CBCs.    LOS: 1 day   Marynell Bies 08/03/2011, 8:52 AM

## 2011-08-04 ENCOUNTER — Encounter (HOSPITAL_COMMUNITY): Payer: Self-pay | Admitting: Internal Medicine

## 2011-08-04 DIAGNOSIS — C9112 Chronic lymphocytic leukemia of B-cell type in relapse: Secondary | ICD-10-CM

## 2011-08-04 DIAGNOSIS — R7303 Prediabetes: Secondary | ICD-10-CM | POA: Diagnosis present

## 2011-08-04 DIAGNOSIS — D801 Nonfamilial hypogammaglobulinemia: Secondary | ICD-10-CM

## 2011-08-04 DIAGNOSIS — D696 Thrombocytopenia, unspecified: Secondary | ICD-10-CM

## 2011-08-04 DIAGNOSIS — L02619 Cutaneous abscess of unspecified foot: Secondary | ICD-10-CM

## 2011-08-04 DIAGNOSIS — L03039 Cellulitis of unspecified toe: Secondary | ICD-10-CM

## 2011-08-04 HISTORY — DX: Prediabetes: R73.03

## 2011-08-04 LAB — URINE CULTURE

## 2011-08-04 LAB — CBC
MCH: 35.8 pg — ABNORMAL HIGH (ref 26.0–34.0)
MCHC: 33.1 g/dL (ref 30.0–36.0)
MCV: 108.1 fL — ABNORMAL HIGH (ref 78.0–100.0)
Platelets: 47 10*3/uL — ABNORMAL LOW (ref 150–400)
RBC: 2.46 MIL/uL — ABNORMAL LOW (ref 4.22–5.81)

## 2011-08-04 LAB — VANCOMYCIN, TROUGH: Vancomycin Tr: 21.9 ug/mL — ABNORMAL HIGH (ref 10.0–20.0)

## 2011-08-04 MED ORDER — VANCOMYCIN HCL 1000 MG IV SOLR
750.0000 mg | Freq: Two times a day (BID) | INTRAVENOUS | Status: DC
  Administered 2011-08-04 – 2011-08-05 (×2): 750 mg via INTRAVENOUS
  Filled 2011-08-04 (×4): qty 750

## 2011-08-04 MED ORDER — LIVING WELL WITH DIABETES BOOK
Freq: Once | Status: AC
  Administered 2011-08-04: 11:00:00
  Filled 2011-08-04: qty 1

## 2011-08-04 MED ORDER — METOPROLOL TARTRATE 25 MG PO TABS
25.0000 mg | ORAL_TABLET | Freq: Two times a day (BID) | ORAL | Status: DC
  Administered 2011-08-04 – 2011-08-05 (×3): 25 mg via ORAL
  Filled 2011-08-04 (×3): qty 1

## 2011-08-04 MED ORDER — ACYCLOVIR 200 MG PO CAPS
ORAL_CAPSULE | ORAL | Status: AC
  Filled 2011-08-04: qty 1

## 2011-08-04 NOTE — Progress Notes (Signed)
Report given to Darel Hong, RN on Dept 300.  Pt will transfer to room 313.  Pt is alert and oriented, VSS; family is at bedside and aware of transfer.   Pt will be taken to room via wheelchair and placed on telemetry by L. Zenda Alpers, NT.

## 2011-08-04 NOTE — Progress Notes (Signed)
ANTIBIOTIC CONSULT NOTE   Pharmacy Consult for Vancomycin and Cefepime Indication: sepsis, fevers  No Known Allergies  Patient Measurements: Height: 5\' 10"  (177.8 cm) Weight: 261 lb 3.9 oz (118.5 kg) IBW/kg (Calculated) : 73   Vital Signs: Temp: 98 F (36.7 C) (05/08 1600) Temp src: Oral (05/08 1600) BP: 136/65 mmHg (05/08 1600) Pulse Rate: 68  (05/08 1600) Intake/Output from previous day: 05/07 0701 - 05/08 0700 In: 6266 [P.O.:1560; I.V.:2070; IV Piggyback:2636] Out: 3050 [Urine:3050] Intake/Output from this shift: Total I/O In: 1380 [P.O.:840; I.V.:490; IV Piggyback:50] Out: 1100 [Urine:1100]  Labs:  Basename 08/04/11 0430 08/03/11 0444 08/02/11 0422 08/02/11 0021  WBC 66.5* 64.1* 105.2* --  HGB 8.8* 8.7* 9.5* --  PLT 47* 42* 54* --  LABCREA -- -- -- --  CREATININE -- 1.32 1.26 1.23   Estimated Creatinine Clearance: 70.1 ml/min (by C-G formula based on Cr of 1.32).  Basename 08/04/11 1509  VANCOTROUGH 21.9*  VANCOPEAK --  Drue Dun --  GENTTROUGH --  GENTPEAK --  GENTRANDOM --  TOBRATROUGH --  TOBRAPEAK --  TOBRARND --  AMIKACINPEAK --  AMIKACINTROU --  AMIKACIN --    Microbiology: Recent Results (from the past 720 hour(s))  URINE CULTURE     Status: Normal   Collection Time   08/02/11 12:20 AM      Component Value Range Status Comment   Specimen Description URINE, CLEAN CATCH   Final    Special Requests IMMUNE:COMPRM   Final    Culture  Setup Time [id number]   Final    Colony Count >=100,000 COLONIES/ML   Final    Culture     Final    Value: GROUP B STREP(S.AGALACTIAE)ISOLATED     Note: TESTING AGAINST S. AGALACTIAE NOT ROUTINELY PERFORMED DUE TO PREDICTABILITY OF AMP/PEN/VAN SUSCEPTIBILITY.   Report Status 08/04/2011 FINAL   Final   CULTURE, BLOOD (ROUTINE X 2)     Status: Normal (Preliminary result)   Collection Time   08/02/11  1:47 AM      Component Value Range Status Comment   Specimen Description BLOOD RIGHT ANTECUBITAL   Final    Special Requests     Final    Value: BOTTLES DRAWN AEROBIC AND ANAEROBIC  AEB=15CC ANA=12CC   Culture NO GROWTH 2 DAYS   Final    Report Status PENDING   Incomplete   CULTURE, BLOOD (ROUTINE X 2)     Status: Normal (Preliminary result)   Collection Time   08/02/11  1:51 AM      Component Value Range Status Comment   Specimen Description BLOOD RIGHT HAND   Final    Special Requests BOTTLES DRAWN AEROBIC AND ANAEROBIC 6CC   Final    Culture NO GROWTH 2 DAYS   Final    Report Status PENDING   Incomplete   MRSA PCR SCREENING     Status: Normal   Collection Time   08/02/11  3:13 AM      Component Value Range Status Comment   MRSA by PCR NEGATIVE  NEGATIVE  Final    Medical History: Past Medical History  Diagnosis Date  . Aortic stenosis   . Atrial fibrillation   . Coronary atherosclerosis of native coronary artery   . Hyperlipidemia   . Essential hypertension, benign   . BPH (benign prostatic hypertrophy)   . Elevated WBC count   . Arthritis   . Acid reflux   . CLL (chronic lymphoblastic leukemia)   . Hodgkin's disease   . Leukemia   .  Pre-diabetes 08/04/2011   Medications:  Scheduled:     . acyclovir  200 mg Oral BID  . atorvastatin  40 mg Oral q1800  . ceFEPime (MAXIPIME) IV  1 g Intravenous Q12H  . folic acid  1 mg Oral Daily  . living well with diabetes book   Does not apply Once  . metoprolol tartrate  25 mg Oral BID  . pantoprazole  40 mg Oral Q1200  . vancomycin  750 mg Intravenous Q12H  . DISCONTD: metoprolol tartrate  12.5 mg Oral BID  . DISCONTD: vancomycin  1,000 mg Intravenous Q12H   Assessment: Pt with CLL presents with fevers and elevated WBC (last known level =76K in 05/2011) started on empiric broad-spectrum antibiotics with Vanc, Levaquin, and Zoysn.  Vancomycin trough level above goal Clearance not as good as predicted  Goal of Therapy:  Vancomycin trough level 15-20 mcg/ml  Plan:  1) reduce Vancomcyin to 750mg  IV q12h 2) continue Cefepime 1gm iv  q12hrs 3) re-check Vanc trough level at steady state 4) Monitor renal function and patient progress, CBC  Tonnia Bardin A, PHARMD 08/04/2011,5:00 PM

## 2011-08-04 NOTE — Evaluation (Signed)
Physical Therapy Evaluation Patient Details Name: [name] MRN: [id number] DOB: Aug 05, 1945 Today's Date: 08/04/2011 Time: 5621-3086 PT Time Calculation (min): 52 min  PT Assessment / Plan / Recommendation Clinical Impression  Pt is very cooperative, tolerated PT eval with no difficulty.  Although he will report general weakness, his strength is good and endurance is functionally WNL.  His gait is stable with no assistive device, but he does feel a bit more secure with a walker.  His biggest problem is the limitation of activity here in the hospital, especially in the ICU setting.  I am going to ask the nursing service to ambulate pt in the hallway bid.  No formal PT should be needed.         PT Assessment  Patent does not need any further PT services    Follow Up Recommendations  No PT follow up    Equipment Recommendations  None recommended by PT    Frequency      Precautions / Restrictions Precautions Precautions: None Restrictions Weight Bearing Restrictions: No   Pertinent Vitals/Pain       Mobility  Bed Mobility Bed Mobility: Supine to Sit;Sitting - Scoot to Edge of Bed;Sit to Supine Supine to Sit: 7: Independent Sitting - Scoot to Edge of Bed: 7: Independent Sit to Supine: 7: Independent Transfers Transfers: Sit to Stand;Stand to Sit Sit to Stand: 7: Independent Stand to Sit: 7: Independent Ambulation/Gait Ambulation/Gait Assistance: 6: Modified independent (Device/Increase time) Ambulation Distance (Feet): 200 Feet Assistive device: None;Rolling walker Ambulation/Gait Assistance Details: used walker part of the time for gait...feels secure with it, but is definately stable without it Gait Pattern: Step-through pattern General Gait Details: increased lateral sway during gait...this is his norm Stairs: Yes Stairs Assistance: 6: Modified independent (Device/Increase time) Stair Management Technique: One rail Right;Step to pattern;Forwards Number of Stairs:  6  (limited by IV line) Wheelchair Mobility Wheelchair Mobility: No    Exercises     PT Goals    Visit Information  Last PT Received On: 08/04/11    Subjective Data  Subjective: feels generally weak Patient Stated Goal: return home   Prior Functioning  Home Living Lives With: Spouse Available Help at Discharge: Family Type of Home: House Home Access: Stairs to enter [employer] of Steps: 2 Entrance Stairs-Rails: None Home Layout: Two level;Full bath on main level;Bed/bath upstairs Alternate Level Stairs-Number of Steps: 12 Alternate Level Stairs-Rails: Right Bathroom Accessibility: Yes How Accessible: Accessible via walker Home Adaptive Equipment: Bedside commode/3-in-1;Walker - rolling;Straight cane Prior Function Level of Independence: Independent Able to Take Stairs?: Yes Driving: Yes Vocation: Retired Musician: No difficulties    Cognition  Overall Cognitive Status: Appears within functional limits for tasks assessed/performed Arousal/Alertness: Awake/alert Orientation Level: Appears intact for tasks assessed Behavior During Session: [facility] for tasks performed    Extremity/Trunk Assessment Right Upper Extremity Assessment RUE ROM/Strength/Tone: Within functional levels RUE Sensation: WFL - Light Touch;WFL - Proprioception RUE Coordination: WFL - gross motor Left Upper Extremity Assessment LUE ROM/Strength/Tone: Within functional levels LUE Sensation: WFL - Light Touch;WFL - Proprioception LUE Coordination: WFL - gross motor Right Lower Extremity Assessment RLE ROM/Strength/Tone: Within functional levels RLE Sensation: WFL - Light Touch;WFL - Proprioception RLE Coordination: WFL - gross motor Left Lower Extremity Assessment LLE ROM/Strength/Tone: Within functional levels LLE Sensation: WFL - Light Touch;WFL - Proprioception LLE Coordination: WFL - gross motor Trunk Assessment Trunk Assessment: Normal   Balance  Balance Balance Assessed: No (WNL by functional observation)  End of Session PT -  End of Session Equipment Utilized During Treatment: Gait belt Activity Tolerance: Patient tolerated treatment well Patient left: in bed;with call bell/phone within reach Nurse Communication: Mobility status   Konrad Penta 08/04/2011, 2:14 PM

## 2011-08-04 NOTE — Progress Notes (Signed)
Subjective: No new complaints, except he feels weak overall.  Objective: Vital signs in last 24 hours: Filed Vitals:   08/04/11 0200 08/04/11 0300 08/04/11 0400 08/04/11 0500  BP: 151/79 150/67 135/59 144/77  Pulse: 82 88 87 90  Temp:   98.5 F (36.9 C)   TempSrc:   Oral   Resp: 22 21 23 22   Height:      Weight:    118.5 kg (261 lb 3.9 oz)  SpO2: 100% 100% 99% 100%    Intake/Output Summary (Last 24 hours) at 08/04/11 0845 Last data filed at 08/04/11 0500  Gross per 24 hour  Intake   5566 ml  Output   2150 ml  Net   3416 ml    Weight change: -0.8 kg (-1 lb 12.2 oz)  Physical exam: General: 66 year-old man lying in bed, in no acute distress. Heart: S1, S2, with a soft systolic murmur. Lungs: Decreased breath sounds in the bases, otherwise clear. Abdomen: Mildly obese, positive bowel sounds, soft, nontender, nondistended. Extremities: No pedal edema. Skin: Large area of cellulitis, mild to moderate erythema on the anterior surface of the right thigh, may be slightly decreased compared to yesterday. Nontender.  Lab Results: Basic Metabolic Panel:  Basename 08/03/11 0444 08/02/11 0422  NA 135 137  K 3.9 4.0  CL 108 102  CO2 21 24  GLUCOSE 125* 136*  BUN 19 20  CREATININE 1.32 1.26  CALCIUM 8.3* 8.6  MG -- --  PHOS -- --   Liver Function Tests:  Basename 08/02/11 0422 08/02/11 0021  AST 28 3738*  ALT 25 2929  ALKPHOS 92 110110  BILITOT 1.0 1.00.9  PROT 5.5* 5.8*6.1  ALBUMIN 3.3* 3.83.8   No results found for this basename: LIPASE:2,AMYLASE:2 in the last 72 hours No results found for this basename: AMMONIA:2 in the last 72 hours CBC:  Basename 08/04/11 0430 08/03/11 0444 08/02/11 0021  WBC 66.5* 64.1* --  NEUTROABS -- -- 3.8  HGB 8.8* 8.7* --  HCT 26.6* 26.5* --  MCV 108.1* 108.2* --  PLT 47* 42* --   Cardiac Enzymes: No results found for this basename: CKTOTAL:3,CKMB:3,CKMBINDEX:3,TROPONINI:3 in the last 72 hours BNP: No results found for  this basename: PROBNP:3 in the last 72 hours D-Dimer: No results found for this basename: DDIMER:2 in the last 72 hours CBG: No results found for this basename: GLUCAP:6 in the last 72 hours Hemoglobin A1C:  Basename 08/02/11 0021  HGBA1C 6.3*   Fasting Lipid Panel: No results found for this basename: CHOL,HDL,LDLCALC,TRIG,CHOLHDL,LDLDIRECT in the last 72 hours Thyroid Function Tests:  Basename 08/03/11 0444  TSH 1.662  T4TOTAL --  FREET4 --  T3FREE --  THYROIDAB --   Anemia Panel: No results found for this basename: VITAMINB12,FOLATE,FERRITIN,TIBC,IRON,RETICCTPCT in the last 72 hours Coagulation: No results found for this basename: LABPROT:2,INR:2 in the last 72 hours Urine Drug Screen: Drugs of Abuse  No results found for this basename: labopia,  cocainscrnur,  labbenz,  amphetmu,  thcu,  labbarb    Alcohol Level: No results found for this basename: ETH:2 in the last 72 hours Urinalysis:  Basename 08/02/11 0020  COLORURINE YELLOW  LABSPEC 1.015  PHURINE 6.0  GLUCOSEU NEGATIVE  HGBUR TRACE*  BILIRUBINUR NEGATIVE  KETONESUR NEGATIVE  PROTEINUR 30*  UROBILINOGEN 2.0*  NITRITE NEGATIVE  LEUKOCYTESUR SMALL*   Misc. Labs:   Micro: Recent Results (from the past 240 hour(s))  URINE CULTURE     Status: Normal   Collection Time   08/02/11 12:20  AM      Component Value Range Status Comment   Specimen Description URINE, CLEAN CATCH   Final    Special Requests IMMUNE:COMPRM   Final    Culture  Setup Time [id number]   Final    Colony Count >=100,000 COLONIES/ML   Final    Culture     Final    Value: GROUP B STREP(S.AGALACTIAE)ISOLATED     Note: TESTING AGAINST S. AGALACTIAE NOT ROUTINELY PERFORMED DUE TO PREDICTABILITY OF AMP/PEN/VAN SUSCEPTIBILITY.   Report Status 08/04/2011 FINAL   Final   CULTURE, BLOOD (ROUTINE X 2)     Status: Normal (Preliminary result)   Collection Time   08/02/11  1:47 AM      Component Value Range Status Comment   Specimen Description  BLOOD RIGHT ANTECUBITAL   Final    Special Requests BOTTLES DRAWN AEROBIC AND ANAEROBIC 8CC   Final    Culture NO GROWTH 1 DAY   Final    Report Status PENDING   Incomplete   CULTURE, BLOOD (ROUTINE X 2)     Status: Normal (Preliminary result)   Collection Time   08/02/11  1:51 AM      Component Value Range Status Comment   Specimen Description BLOOD RIGHT HAND   Final    Special Requests BOTTLES DRAWN AEROBIC AND ANAEROBIC 6CC   Final    Culture NO GROWTH 1 DAY   Final    Report Status PENDING   Incomplete   MRSA PCR SCREENING     Status: Normal   Collection Time   08/02/11  3:13 AM      Component Value Range Status Comment   MRSA by PCR NEGATIVE  NEGATIVE  Final     Studies/Results: No results found.  Medications:  Scheduled:    . acyclovir  200 mg Oral BID  . atorvastatin  40 mg Oral q1800  . ceFEPime (MAXIPIME) IV  1 g Intravenous Q12H  . folic acid  1 mg Oral Daily  . IMMUNE GLOBLULIN (HUMAN) IV  1 g/kg Intravenous Q24H  . metoprolol tartrate  12.5 mg Oral BID  . pantoprazole  40 mg Oral Q1200  . sodium chloride      . vancomycin  1,000 mg Intravenous Q12H   Continuous:    . 0.9 % NaCl with KCl 20 mEq / L 70 mL/hr at 08/04/11 0500  . DISCONTD: sodium chloride 0.9 % 1,000 mL with potassium chloride 20 mEq infusion 70 mL/hr at 08/03/11 1200   NFA:OZHYQMVHQIONG, bisacodyl, ondansetron (ZOFRAN) IV, ondansetron, traZODone  Assessment: Active Problems:  CLL  HYPERLIPIDEMIA  OBESITY  CORONARY ATHEROSCLEROSIS NATIVE CORONARY ARTERY  Atrial fibrillation  FEVER UNSPECIFIED  Essential hypertension, benign  GERD (gastroesophageal reflux disease)  Thrombocytopenia  Hypogammaglobulinemia, acquired  Anemia  Tachyarrhythmia  Pre-diabetes   1. Right lower extremity cellulitis. He is on vancomycin and cefepime. Will continue. This is likely the source of his fever versus fever from recurrent CLL.  2. Possible early sepsis versus systemic inflammatory response syndrome.  On antibiotics above. His blood pressure is better.  3. Leukocytosis secondary to recurrent CLL and hypogammaglobulinemia. He is on IVIG per the oncology team.  4. Thrombocytopenia, secondary to #3. 5. Anemia, secondary to #3.  6. Tachyarrhythmia with a history of atrial fibrillation. Status post IV Cardizem. His heart rate is now controlled. His rhythm is normal sinus rhythm by exam. Metoprolol restart it at a smaller dose.  7. Hypertension. We'll need to titrate metoprolol accordingly.  8. Prediabetes  versus borderline diabetes. His hemoglobin A1c is 6.3.    Plan:  1. Diabetes education. 2. We'll change his diet to a carbohydrate modified diet. 3. We'll increase metoprolol to 25 mg twice a day and then eventually to 50 mg twice a day as needed. 4. Further recommendations per the oncology team. 5. Physical therapy consultation.    LOS: 2 days   Dalana Pfahler 08/04/2011, 8:45 AM

## 2011-08-04 NOTE — Plan of Care (Signed)
Problem: Food- and Nutrition-Related Knowledge Deficit (NB-1.1) Goal: Nutrition education Formal process to instruct or train a patient/client in a skill or to impart knowledge to help patients/clients voluntarily manage or modify food choices and eating behavior to maintain or improve health.  Outcome: Adequate for Discharge Pt provided: Living Well With Diabetes Handbook by [name]. Verbal review included not limited to basics of identifying carbohydrate foods, consistent carbohydrate intake and label reading. Pt says spouse usually purchases and prepares foods. She is not present but will return to review diet recommendations with her if needed.  Dietitian [phone]

## 2011-08-04 NOTE — Progress Notes (Signed)
Inpatient Diabetes Program Recommendations  AACE/ADA: New Consensus Statement on Inpatient Glycemic Control  Target Ranges:  Prepandial:   less than 140 mg/dL      Peak postprandial:   less than 180 mg/dL (1-2 hours)      Critically ill patients:  140 - 180 mg/dL  Pager:  [phone] Hours:  8 am-10pm   Reason for Visit: Pre-Diabetes A1C 6.2 %   Note:  Tried to have conversation with patient. However, patient unarousable.  Left information about free outpatient classes to learn more about diabetes.  Also ordered additional education resources and RD consult when patient more alert.  Will check back with patient at later time.  Thank you for the consult.  Alfredia Client, PhD, RN, MSN

## 2011-08-04 NOTE — Progress Notes (Signed)
Subjective: [name] is doing well. Wife and youngest son at the bedside.  He feels well.  We provided the patient and family with patient education regarding his compromised immune state and the role of IVIg in his care and treatment of this cellulitis.  Good handwashing techniques were discussed and avoidance of infected/sick people.    He understands that we are weary of treating his CLL in the future if needed due to his past treatments and his inability to tolerate his previous treatment due severely diminished counts following chemotherapy.  His platelet counts has never fully recovered as a matter of fact.  He understands that future infections may require IVIg treatments.  It is not given prophylactically.    Objective: Vital signs in last 24 hours: Temp:  [98 F (36.7 C)-100.2 F (37.9 C)] 98 F (36.7 C) (05/08 1600) Pulse Rate:  [68-95] 84  (05/08 1700) Resp:  [15-26] 19  (05/08 1600) BP: (102-151)/(48-85) 112/64 mmHg (05/08 1700) SpO2:  [93 %-100 %] 99 % (05/08 1700) Weight:  [261 lb 3.9 oz (118.5 kg)] 261 lb 3.9 oz (118.5 kg) (05/08 0500)  Intake/Output from previous day: 05/07 0800 - 05/08 0759 In: 6266 [P.O.:1560; I.V.:2070; IV Piggyback:2636] Out: 3050 [Urine:3050] Intake/Output this shift: Total I/O In: 2020 [P.O.:1200; I.V.:770; IV Piggyback:50] Out: 1100 [Urine:1100]  General appearance: alert, cooperative and no distress Extremities: right medial upper leg erythema/cellulitis appreciated.  Much improved.  Less erythematous. Regressed from outlined lines.  Lab Results:   Basename 08/04/11 0430 08/03/11 0444  WBC 66.5* 64.1*  HGB 8.8* 8.7*  HCT 26.6* 26.5*  PLT 47* 42*   BMET  Basename 08/03/11 0444 08/02/11 0422  NA 135 137  K 3.9 4.0  CL 108 102  CO2 21 24  GLUCOSE 125* 136*  BUN 19 20  CREATININE 1.32 1.26  CALCIUM 8.3* 8.6    Studies/Results: No results found.  Medications: I have reviewed the patient's current  medications.  Assessment/Plan: 1. Right medial thigh cellulitis, much improved.  He denies any insect bites, including mosquito and spider bites. S/P 2 doses of IVIg on 08/02/2011 and 08/03/2011.  Continue antibiotics as deemed fit.  Will continue to follow while an inpatient. 2. Fevers of unknown origin, likely from #1  3. Recurrent chronic lymphocytic leukemia. Not under active treatment. 4. Thrombocytopenia, will follow. 5. 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 He is in a complete remission from the Hodgkin's.  6. Status post fludarabine, Cytoxan, and rituximab therapy for 3 cycles when he presented with CLL in 2004.  7. Pan-hypogammaglobulinemia.  8. Severe deconditioning.  9. History of Agent Orange exposure during the [place] War and he states that just within the last 2 months he has been granted full disability from the [place].   10.Thrombocytopenia persistent ever since his chemotherapy for his Hodgkin disease, but getting slightly worse.       LOS: 2 days    [name] 08/04/2011

## 2011-08-05 ENCOUNTER — Encounter (HOSPITAL_COMMUNITY): Payer: Self-pay | Admitting: Internal Medicine

## 2011-08-05 ENCOUNTER — Other Ambulatory Visit (HOSPITAL_COMMUNITY): Payer: Self-pay | Admitting: Oncology

## 2011-08-05 DIAGNOSIS — L02619 Cutaneous abscess of unspecified foot: Secondary | ICD-10-CM

## 2011-08-05 DIAGNOSIS — D801 Nonfamilial hypogammaglobulinemia: Secondary | ICD-10-CM

## 2011-08-05 DIAGNOSIS — D696 Thrombocytopenia, unspecified: Secondary | ICD-10-CM

## 2011-08-05 DIAGNOSIS — L03039 Cellulitis of unspecified toe: Secondary | ICD-10-CM

## 2011-08-05 DIAGNOSIS — N390 Urinary tract infection, site not specified: Secondary | ICD-10-CM | POA: Diagnosis present

## 2011-08-05 DIAGNOSIS — C9112 Chronic lymphocytic leukemia of B-cell type in relapse: Secondary | ICD-10-CM

## 2011-08-05 LAB — CBC
Platelets: 43 10*3/uL — ABNORMAL LOW (ref 150–400)
RBC: 2.47 MIL/uL — ABNORMAL LOW (ref 4.22–5.81)
RDW: 16.3 % — ABNORMAL HIGH (ref 11.5–15.5)
WBC: 63.4 10*3/uL (ref 4.0–10.5)

## 2011-08-05 LAB — BASIC METABOLIC PANEL
Chloride: 106 mEq/L (ref 96–112)
Creatinine, Ser: 1.02 mg/dL (ref 0.50–1.35)
GFR calc Af Amer: 86 mL/min — ABNORMAL LOW (ref >90)
Potassium: 4.2 mEq/L (ref 3.5–5.1)
Sodium: 134 mEq/L — ABNORMAL LOW (ref 135–145)

## 2011-08-05 LAB — GLUCOSE, CAPILLARY: Glucose-Capillary: 138 mg/dL — ABNORMAL HIGH (ref 70–99)

## 2011-08-05 LAB — VITAMIN B12: Vitamin B-12: 426 pg/mL (ref 211–911)

## 2011-08-05 MED ORDER — SODIUM CHLORIDE 0.9 % IJ SOLN
INTRAMUSCULAR | Status: AC
  Filled 2011-08-05: qty 3

## 2011-08-05 MED ORDER — LEVOFLOXACIN 500 MG PO TABS: 500.0000 mg | ORAL_TABLET | Freq: Every day | ORAL | Status: DC

## 2011-08-05 MED ORDER — SODIUM CHLORIDE 0.9 % IJ SOLN
INTRAMUSCULAR | Status: AC
  Administered 2011-08-05: 12:00:00
  Filled 2011-08-05: qty 3

## 2011-08-05 MED ORDER — LEVOFLOXACIN 500 MG PO TABS
500.0000 mg | ORAL_TABLET | Freq: Every day | ORAL | Status: DC
  Administered 2011-08-05: 500 mg via ORAL
  Filled 2011-08-05: qty 1

## 2011-08-05 MED ORDER — SODIUM CHLORIDE 0.9 % IJ SOLN
INTRAMUSCULAR | Status: AC
  Administered 2011-08-05: 10 mL
  Filled 2011-08-05: qty 3

## 2011-08-05 MED ORDER — HEPARIN SOD (PORK) LOCK FLUSH 100 UNIT/ML IV SOLN
500.0000 [IU] | Freq: Once | INTRAVENOUS | Status: DC
  Filled 2011-08-05: qty 5

## 2011-08-05 NOTE — Progress Notes (Signed)
ANTIBIOTIC CONSULT NOTE   Pharmacy Consult for Vancomycin and Cefepime Indication: sepsis, fevers  No Known Allergies  Patient Measurements: Height: 5\' 10"  (177.8 cm) Weight: 254 lb 10.1 oz (115.5 kg) IBW/kg (Calculated) : 73   Vital Signs: Temp: 97.9 F (36.6 C) (05/09 1610) Temp src: Oral (05/09 0614) BP: 135/77 mmHg (05/09 0614) Pulse Rate: 89  (05/09 0614) Intake/Output from previous day: 05/08 0701 - 05/09 0700 In: 3415.2 [P.O.:1540; I.V.:1625.2; IV Piggyback:250] Out: 2925 [Urine:2925] Intake/Output from this shift: Total I/O In: 360 [P.O.:360] Out: 350 [Urine:350]  Labs:  [facility] 08/05/11 0436 08/04/11 0430 08/03/11 0444  WBC 63.4* 66.5* 64.1*  HGB 9.0* 8.8* 8.7*  PLT 43* 47* 42*  LABCREA -- -- --  CREATININE 1.02 -- 1.32   Estimated Creatinine Clearance: 89.5 ml/min (by C-G formula based on Cr of 1.02).  Basename 08/04/11 1509  VANCOTROUGH 21.9*  VANCOPEAK --  Drue Dun --  GENTTROUGH --  GENTPEAK --  GENTRANDOM --  TOBRATROUGH --  TOBRAPEAK --  TOBRARND --  AMIKACINPEAK --  AMIKACINTROU --  AMIKACIN --    Microbiology: Recent Results (from the past 720 hour(s))  URINE CULTURE     Status: Normal   Collection Time   08/02/11 12:20 AM      Component Value Range Status Comment   Specimen Description URINE, CLEAN CATCH   Final    Special Requests IMMUNE:COMPRM   Final    Culture  Setup Time [id number]   Final    Colony Count >=100,000 COLONIES/ML   Final    Culture     Final    Value: GROUP B STREP(S.AGALACTIAE)ISOLATED     Note: TESTING AGAINST S. AGALACTIAE NOT ROUTINELY PERFORMED DUE TO PREDICTABILITY OF AMP/PEN/VAN SUSCEPTIBILITY.   Report Status 08/04/2011 FINAL   Final   CULTURE, BLOOD (ROUTINE X 2)     Status: Normal (Preliminary result)   Collection Time   08/02/11  1:47 AM      Component Value Range Status Comment   Specimen Description BLOOD RIGHT ANTECUBITAL   Final    Special Requests     Final    Value: BOTTLES DRAWN AEROBIC  AND ANAEROBIC  AEB=15CC ANA=12CC   Culture NO GROWTH 2 DAYS   Final    Report Status PENDING   Incomplete   CULTURE, BLOOD (ROUTINE X 2)     Status: Normal (Preliminary result)   Collection Time   08/02/11  1:51 AM      Component Value Range Status Comment   Specimen Description BLOOD RIGHT HAND   Final    Special Requests BOTTLES DRAWN AEROBIC AND ANAEROBIC 6CC   Final    Culture NO GROWTH 2 DAYS   Final    Report Status PENDING   Incomplete   MRSA PCR SCREENING     Status: Normal   Collection Time   08/02/11  3:13 AM      Component Value Range Status Comment   MRSA by PCR NEGATIVE  NEGATIVE  Final    Medical History: Past Medical History  Diagnosis Date  . Aortic stenosis   . Atrial fibrillation   . Coronary atherosclerosis of native coronary artery   . Hyperlipidemia   . Essential hypertension, benign   . BPH (benign prostatic hypertrophy)   . Elevated WBC count   . Arthritis   . Acid reflux   . CLL (chronic lymphoblastic leukemia)   . Hodgkin's disease   . Hypogammaglobulinemia   . Pre-diabetes 08/04/2011   Medications:  Scheduled:     .  acyclovir  200 mg Oral BID  . atorvastatin  40 mg Oral q1800  . ceFEPime (MAXIPIME) IV  1 g Intravenous Q12H  . folic acid  1 mg Oral Daily  . living well with diabetes book   Does not apply Once  . metoprolol tartrate  25 mg Oral BID  . pantoprazole  40 mg Oral Q1200  . vancomycin  750 mg Intravenous Q12H  . DISCONTD: metoprolol tartrate  12.5 mg Oral BID  . DISCONTD: vancomycin  1,000 mg Intravenous Q12H   Assessment: Pt with CLL presents with fevers and elevated WBC (last known level =76K in 05/2011) started on empiric broad-spectrum antibiotics with Vanc, Levaquin, and Zoysn.  Vancomycin trough level above goal, dose modified 5/8 Clearance not as good as predicted  Goal of Therapy:  Vancomycin trough level 15-20 mcg/ml  Plan:  1) reduce Vancomcyin to 750mg  IV q12h 2) continue Cefepime 1gm iv q12hrs 3) re-check Vanc trough  level Friday am 4) Monitor renal function and patient progress, CBC  Kaleea Penner A, PHARMD 08/05/2011,8:43 AM

## 2011-08-05 NOTE — Progress Notes (Signed)
Discharge instructions given to pt. With understanding verbalized, pt. Taken to car via w/c and placed in car.

## 2011-08-05 NOTE — Discharge Summary (Signed)
Physician Discharge Summary  [name] MRN: [id number] DOB/AGE: 1945/09/03 66 y.o.  PCP: [name], MD, MD   Admit date: 08/02/2011 Discharge date: 08/05/2011  Discharge Diagnoses:  1. Sepsis versus systemic inflammatory response syndrome, secondary to right leg cellulitis in the setting of immunosuppression. 2. Hypogammaglobulinemia, chronic. Status post 2 doses of IVIG during the hospital course. 3. Recurrent chronic lymphocytic leukemia. Not under active treatment per oncology. On admission, the patient's white blood cell count was 126 and at the time of discharge, it was 63.4. 4. Chronic thrombocytopenia, persistent ever since chemotherapy for Hodgkin's disease. The patient's platelet count was 43,000 at the time of discharge. 5. Macrocytic anemia, secondary to CLL. 6. Prediabetes, counseling given to be treated with diet alone. His hemoglobin A1c was 6.3. 7. Urinary tract infection, secondary to group B strep, incidental finding given no significant urine WBCs.. 8. Paroxysmal atrial fibrillation with brief rapid ventricular response.    Medication List  As of 08/05/2011 12:20 PM   STOP taking these medications         ibuprofen 200 MG tablet         TAKE these medications         acetaminophen 500 MG tablet   Commonly known as: TYLENOL   Take 1,000 mg by mouth once as needed. For pain      acyclovir 200 MG capsule   Commonly known as: ZOVIRAX   Take 200 mg by mouth 2 (two) times daily. Take one tablet by mouth twice a day      ASPIRIN LOW DOSE 81 MG EC tablet   Generic drug: aspirin   Take 81 mg by mouth daily. Take one tablet by mouth daily      CENTRUM SILVER ULTRA MENS PO   Take 1 tablet by mouth daily.      folic acid 1 MG tablet   Commonly known as: FOLVITE   Take 1 mg by mouth daily.      levofloxacin 500 MG tablet   Commonly known as: LEVAQUIN   Take 1 tablet (500 mg total) by mouth daily. ANTIBODY TO BE TAKEN FOR 5 MORE DAYS.     metoprolol 50 MG tablet   Commonly known as: LOPRESSOR   Take 50 mg by mouth 2 (two) times daily.      OMEGA 3 1000 MG Caps   Take 1,000 mg by mouth every morning.      omeprazole 20 MG capsule   Commonly known as: PRILOSEC   Take 20 mg by mouth every morning.      rosuvastatin 40 MG tablet   Commonly known as: CRESTOR   Take 40 mg by mouth daily.            Discharge Condition: Improved.  Disposition: 01-Home or Self Care   Consults: Dr. [name], the Mr. Jacalyn Lefevre   Significant Diagnostic Studies: Dg Chest 2 View  08/02/2011  *RADIOLOGY REPORT*  Clinical Data: Weakness, fever and shortness of breath.  CHEST - 2 VIEW  Comparison: Chest radiograph performed 05/11/2011  Findings: The lungs are well-aerated and clear.  There is no evidence of focal opacification, pleural effusion or pneumothorax.  The heart is normal in size; the patient is status post median sternotomy, with evidence of prior CABG.  An aortic valve replacement is noted.  A right subclavian line is noted ending about the mid to distal SVC.  No acute osseous abnormalities are seen.  IMPRESSION: No acute cardiopulmonary process seen.  Original  Report Authenticated By: Tonia Ghent, M.D.     Microbiology: Recent Results (from the past 240 hour(s))  URINE CULTURE     Status: Normal   Collection Time   08/02/11 12:20 AM      Component Value Range Status Comment   Specimen Description URINE, CLEAN CATCH   Final    Special Requests IMMUNE:COMPRM   Final    Culture  Setup Time [id number]   Final    Colony Count >=100,000 COLONIES/ML   Final    Culture     Final    Value: GROUP B STREP(S.AGALACTIAE)ISOLATED     Note: TESTING AGAINST S. AGALACTIAE NOT ROUTINELY PERFORMED DUE TO PREDICTABILITY OF AMP/PEN/VAN SUSCEPTIBILITY.   Report Status 08/04/2011 FINAL   Final   CULTURE, BLOOD (ROUTINE X 2)     Status: Normal (Preliminary result)   Collection Time   08/02/11  1:47 AM      Component Value Range Status Comment    Specimen Description BLOOD RIGHT ANTECUBITAL   Final    Special Requests     Final    Value: BOTTLES DRAWN AEROBIC AND ANAEROBIC  AEB=15CC ANA=12CC   Culture NO GROWTH 2 DAYS   Final    Report Status PENDING   Incomplete   CULTURE, BLOOD (ROUTINE X 2)     Status: Normal (Preliminary result)   Collection Time   08/02/11  1:51 AM      Component Value Range Status Comment   Specimen Description BLOOD RIGHT HAND   Final    Special Requests BOTTLES DRAWN AEROBIC AND ANAEROBIC 6CC   Final    Culture NO GROWTH 2 DAYS   Final    Report Status PENDING   Incomplete   MRSA PCR SCREENING     Status: Normal   Collection Time   08/02/11  3:13 AM      Component Value Range Status Comment   MRSA by PCR NEGATIVE  NEGATIVE  Final      Labs: Results for orders placed during the hospital encounter of 08/02/11 (from the past 48 hour(s))  CBC     Status: Abnormal   Collection Time   08/04/11  4:30 AM      Component Value Range Comment   WBC 66.5 (*) 4.0 - 10.5 (K/uL)    RBC 2.46 (*) 4.22 - 5.81 (MIL/uL)    Hemoglobin 8.8 (*) 13.0 - 17.0 (g/dL)    HCT 14.7 (*) 82.9 - 52.0 (%)    MCV 108.1 (*) 78.0 - 100.0 (fL)    MCH 35.8 (*) 26.0 - 34.0 (pg)    MCHC 33.1  30.0 - 36.0 (g/dL)    RDW 56.2 (*) 13.0 - 15.5 (%)    Platelets 47 (*) 150 - 400 (K/uL)   VANCOMYCIN, TROUGH     Status: Abnormal   Collection Time   08/04/11  3:09 PM      Component Value Range Comment   Vancomycin Tr 21.9 (*) 10.0 - 20.0 (ug/mL)   GLUCOSE, CAPILLARY     Status: Abnormal   Collection Time   08/04/11  5:36 PM      Component Value Range Comment   Glucose-Capillary 138 (*) 70 - 99 (mg/dL)    Comment 1 Documented in Chart      Comment 2 Notify RN     CBC     Status: Abnormal   Collection Time   08/05/11  4:36 AM      Component Value Range Comment   WBC  63.4 (*) 4.0 - 10.5 (K/uL)    RBC 2.47 (*) 4.22 - 5.81 (MIL/uL)    Hemoglobin 9.0 (*) 13.0 - 17.0 (g/dL)    HCT 16.1 (*) 09.6 - 52.0 (%)    MCV 107.7 (*) 78.0 - 100.0 (fL)    MCH  36.4 (*) 26.0 - 34.0 (pg)    MCHC 33.8  30.0 - 36.0 (g/dL)    RDW 04.5 (*) 40.9 - 15.5 (%)    Platelets 43 (*) 150 - 400 (K/uL)   BASIC METABOLIC PANEL     Status: Abnormal   Collection Time   08/05/11  4:36 AM      Component Value Range Comment   Sodium 134 (*) 135 - 145 (mEq/L)    Potassium 4.2  3.5 - 5.1 (mEq/L)    Chloride 106  96 - 112 (mEq/L)    CO2 22  19 - 32 (mEq/L)    Glucose, Bld 102 (*) 70 - 99 (mg/dL)    BUN 13  6 - 23 (mg/dL)    Creatinine, Ser 8.11  0.50 - 1.35 (mg/dL)    Calcium 8.8  8.4 - 10.5 (mg/dL)    GFR calc non Af Amer 74 (*) >90 (mL/min)    GFR calc Af Amer 86 (*) >90 (mL/min)   GLUCOSE, CAPILLARY     Status: Normal   Collection Time   08/05/11  6:34 AM      Component Value Range Comment   Glucose-Capillary 99  70 - 99 (mg/dL)    Comment 1 Notify RN        HPI: The patient is a 66 year old man with a past medical history significant for paroxysmal atrial fibrillation, Hodgkin's lymphoma, CLL, and hypogammaglobulinemia, who presented to the emergency department on May sixth, 2013, with a chief complaint of fevers. In the emergency department, his blood pressure was borderline low at 100/60. He was otherwise hemodynamically stable and borderline febrile with temperature of 99.8. His urinalysis revealed 0-2 WBCs, 3-6 RBCs, and a few bacteria. His white blood cell count was 125.8 with an absolute lymphocyte count of 119.Marland Kitchen His hemoglobin was 10.5. His platelet count was 63. His glucose was 150. His chest x-ray revealed no acute cardiopulmonary process. He was admitted for further evaluation and management.  HOSPITAL COURSE: The patient was started on broad-spectrum antibiotic treatment with vancomycin, Zosyn, and Levaquin. The exact etiology of his fever was unknown. Nevertheless, blood cultures and a urine culture were ordered. It was later discovered that he had a cellulitic area on his right side. He denied trauma or any known insect bites. Following admission, his  blood pressure fell into the 80s systolically. There was a concern regarding sepsis or systemic inflammatory response syndrome. Levaquin and Zosyn were discontinued in favor of vancomycin and cefepime only. Because of the hypotension, he was bolused IV fluids and transferred to the step down unit. Lovenox was discontinued in the setting of thrombocytopenia to avoid bleeding. Also, metoprolol was discontinued temporarily because of the hypotension. Procalcitonin and lactic acid levels were ordered for evaluation. His lactic acid level was within normal limits, however, his procalcitonin level was modestly elevated. His TSH was assessed and it was within normal limits at 1.66.  Oncology was consulted for assessment of the need for IVIG in this patient with chronic hypogammaglobulinemia. Mr. Jacalyn Lefevre provided the initial consultation. He recommended IVIG. The patient was subsequently transfused IVIG on 2 occasions. For his other chronic conditions, Mr. Jacalyn Lefevre noted that the patient completed chemotherapy for his  Hodgkin's disease for which he is now in complete remission. He is also status post chemotherapy for CLL, but he is not under current chemotherapy treatment. He noted that the patient's thrombocytopenia has persisted since chemotherapy for his Hodgkin's disease but overall, it appears that his thrombocytopenia has gotten worse.  During the initial infusion of IVIG, the patient became tachycardic. It appeared that he had gone into rapid atrial fibrillation. The patient does have a history of paroxysmal atrial fibrillation. He was treated accordingly with IV Cardizem. Small doses of metoprolol was restarted with parameters to hold it for hypotension. Subsequently, the patient was premedicated with Tylenol and Benadryl prior to the IVIG infusions. His rhythm returned to normal sinus and his rate became controlled shortly thereafter and remained controlled throughout the remainder of the hospitalization. He was  maintained on aspirin for antiplatelet therapy in the setting of paroxysmal atrial fibrillation.  The patient's white blood cell count decreased by approximately half. He became symptomatically improved. The right thigh cellulitis/erythema had decreased significantly but had not completely resolved. He became physically stronger. He was able to ambulate with the physical therapist without any difficulty. The therapist did not recommend any further therapy as the patient was ambulating fine. He maintained an afebrile state throughout the majority of the hospitalization. His blood cultures remained negative. However, his urine culture did grow out 100,000 colonies of group B strep. This was thought to be an incidental finding as his urinalysis was virtually unremarkable. The patient received 4 days of IV antibiotics. He was discharged to home on 5 more days of Levaquin. He will followup with his primary care physician Dr. Lodema Hong and oncologist Dr. [name] in one to 2 weeks, respectively.    Discharge Exam:  Blood pressure 107/69, pulse 74, temperature 98.7 F (37.1 C), temperature source Oral, resp. rate 17, height 5\' 10"  (1.778 m), weight 115.5 kg (254 lb 10.1 oz), SpO2 99.00%.  Lungs: Decreased breath sounds at bases, otherwise clear. Heart: S1, S2, with a soft systolic murmur. Abdomen: Positive bowel sounds, soft, nontender, nondistended. Extremities: Trace of pedal edema bilaterally. Skin: Significant decrease in erythema of the right thigh cellulites area. Nontender.   Discharge Orders    Future Appointments: Provider: Department: Dept Phone: Center:   08/10/2011 2:30 PM Kerri Perches, MD [facility] [phone] [facility]   08/18/2011 11:30 AM [facility] [phone] None   08/20/2011 11:30 AM Randall An, MD [facility] [phone] None   10/14/2011 1:00 PM Kerri Perches, MD [facility] [phone] [facility]     Future Orders Please  Complete By Expires   Diet Carb Modified      Increase activity slowly      Discharge instructions      Comments:   FOLLOW A LOW SUGAR OR CARBOHYDRATE MODIFIED DIET.      Follow-up Information    Follow up with Randall An, MD on 08/20/2011. (AT 11:30 AM)    Contact information:   [address] [place] [address] [phone]       Follow up with [name], MD on 08/10/2011. (AT 2:30 PM)    Contact information:   [address], [address] [place] [address] [phone]          Total discharge time: 45 minutes.  Signed: Sharita Bienaime 08/05/2011, 12:20 PM

## 2011-08-06 NOTE — Progress Notes (Signed)
UR Chart Review Completed  

## 2011-08-07 LAB — CULTURE, BLOOD (ROUTINE X 2)

## 2011-08-10 ENCOUNTER — Encounter: Payer: Self-pay | Admitting: Family Medicine

## 2011-08-10 ENCOUNTER — Ambulatory Visit (INDEPENDENT_AMBULATORY_CARE_PROVIDER_SITE_OTHER): Payer: Medicare Other | Admitting: Family Medicine

## 2011-08-10 VITALS — BP 122/60 | HR 87 | Resp 18 | Ht 70.0 in | Wt 246.1 lb

## 2011-08-10 DIAGNOSIS — I10 Essential (primary) hypertension: Secondary | ICD-10-CM

## 2011-08-10 DIAGNOSIS — L03116 Cellulitis of left lower limb: Secondary | ICD-10-CM

## 2011-08-10 DIAGNOSIS — E785 Hyperlipidemia, unspecified: Secondary | ICD-10-CM

## 2011-08-10 DIAGNOSIS — L02419 Cutaneous abscess of limb, unspecified: Secondary | ICD-10-CM

## 2011-08-10 DIAGNOSIS — R7309 Other abnormal glucose: Secondary | ICD-10-CM

## 2011-08-10 DIAGNOSIS — R7303 Prediabetes: Secondary | ICD-10-CM

## 2011-08-10 DIAGNOSIS — K219 Gastro-esophageal reflux disease without esophagitis: Secondary | ICD-10-CM

## 2011-08-10 DIAGNOSIS — R7301 Impaired fasting glucose: Secondary | ICD-10-CM

## 2011-08-10 DIAGNOSIS — E669 Obesity, unspecified: Secondary | ICD-10-CM

## 2011-08-10 NOTE — Progress Notes (Signed)
  Subjective:    Patient ID: ABB GOBERT, male    DOB: April 07, 1945, 65 y.o.   MRN: [id number]  HPI The PT is here for follow up and re-evaluation of chronic medical conditions, medication management and review of any available recent lab and radiology data.  Preventive health is updated, specifically  Cancer screening and Immunization.   He was recently hospitalized with cellulititis of the right thigh and a WBC over 100, both have significantly improved, and WBC is back to his baseline.   New concern is his dx of prediabetes, which he intend to overcome with lifestyle change, and is doing well with weight loss There are no specific complaints       Review of Systems See HPI Denies recent fever or chills. Denies sinus pressure, nasal congestion, ear pain or sore throat. Denies chest congestion, productive cough or wheezing. Denies chest pains, palpitations and leg swelling Denies abdominal pain, nausea, vomiting,diarrhea or constipation.   Denies dysuria, frequency, hesitancy or incontinence. Denies joint pain, swelling and limitation in mobility. Denies headaches, seizures, numbness, or tingling. Denies depression, anxiety or insomnia. Reports marked improvement in redness of thigh where he had cellulitis        Objective:   Physical Exam Patient alert and oriented and in no cardiopulmonary distress.  HEENT: No facial asymmetry, EOMI, no sinus tenderness,  oropharynx pink and moist.  Neck supple no adenopathy.  Chest: Clear to auscultation bilaterally.  CVS: S1, S2  murmur, no S3.  ABD: Soft non tender. Bowel sounds normal.  Ext: No edema  MS: Adequate ROM spine, shoulders, hips and knees.  Skin: Intact,mild erythema of right inner thigh  Psych: Good eye contact, normal affect. Memory intact not anxious or depressed appearing.  CNS: CN 2-12 intact, power, tone and sensation normal throughout.        Assessment & Plan:

## 2011-08-10 NOTE — Patient Instructions (Addendum)
F/u in 3.5 month  Please call if you need me before  Please work on changing eating by reducing sweets and carbs and losing weight sio that you do not become diabetic  Fasting lipid, cmp and hBA1C in 3.12month before visit.  It is important that you exercise regularly at least 30 minutes 5 times a week. If you develop chest pain, have severe difficulty breathing, or feel very tired, stop exercising immediately and seek medical attention    A healthy diet is rich in fruit, vegetables and whole grains. Poultry fish, nuts and beans are a healthy choice for protein rather then red meat. A low sodium diet and drinking 64 ounces of water daily is generally recommended. Oils and sweet should be limited. Carbohydrates especially for those who are diabetic or overweight, should be limited to 34-45 gram per meal. It is important to eat on a regular schedule, at least 3 times daily. Snacks should be primarily fruits, vegetables or nuts.

## 2011-08-18 ENCOUNTER — Other Ambulatory Visit (HOSPITAL_COMMUNITY): Payer: Medicare Other

## 2011-08-20 ENCOUNTER — Ambulatory Visit (HOSPITAL_COMMUNITY): Payer: Medicare Other | Admitting: Oncology

## 2011-08-23 DIAGNOSIS — L03116 Cellulitis of left lower limb: Secondary | ICD-10-CM | POA: Insufficient documentation

## 2011-08-23 NOTE — Assessment & Plan Note (Signed)
Improved. Pt applauded on succesful weight loss through lifestyle change, and encouraged to continue same. Weight loss goal set for the next several months.  

## 2011-08-23 NOTE — Assessment & Plan Note (Signed)
Improved, hospitalized for 3 days recently for this marked improvement with near complete resolution.

## 2011-08-23 NOTE — Assessment & Plan Note (Signed)
Controlled, no change in medication  

## 2011-08-23 NOTE — Assessment & Plan Note (Signed)
The importance of weight loss and lifestyle change discussed and encouraged

## 2011-08-23 NOTE — Assessment & Plan Note (Signed)
Uncontrolled when last checked, updated labs needed.  Low fat diet discussed and encouraged

## 2011-08-31 ENCOUNTER — Other Ambulatory Visit: Payer: Self-pay | Admitting: Cardiology

## 2011-09-16 ENCOUNTER — Encounter (HOSPITAL_COMMUNITY): Payer: Medicare Other | Attending: Oncology

## 2011-09-16 DIAGNOSIS — C9110 Chronic lymphocytic leukemia of B-cell type not having achieved remission: Secondary | ICD-10-CM | POA: Insufficient documentation

## 2011-09-16 LAB — COMPREHENSIVE METABOLIC PANEL
BUN: 18 mg/dL (ref 6–23)
CO2: 23 mEq/L (ref 19–32)
Chloride: 104 mEq/L (ref 96–112)
Creatinine, Ser: 1.31 mg/dL (ref 0.50–1.35)
GFR calc non Af Amer: 55 mL/min — ABNORMAL LOW (ref >90)
Total Bilirubin: 0.3 mg/dL (ref 0.3–1.2)

## 2011-09-16 LAB — DIFFERENTIAL
Eosinophils Relative: 1 % (ref 0–5)
Lymphs Abs: 148.3 10*3/uL — ABNORMAL HIGH (ref 0.7–4.0)
Monocytes Absolute: 3.1 10*3/uL — ABNORMAL HIGH (ref 0.1–1.0)

## 2011-09-16 LAB — CBC
MCH: 35.4 pg — ABNORMAL HIGH (ref 26.0–34.0)
MCHC: 31.4 g/dL (ref 30.0–36.0)
MCV: 113.1 fL — ABNORMAL HIGH (ref 78.0–100.0)
Platelets: 48 10*3/uL — ABNORMAL LOW (ref 150–400)
RBC: 2.68 MIL/uL — ABNORMAL LOW (ref 4.22–5.81)

## 2011-09-16 LAB — LACTATE DEHYDROGENASE: LDH: 254 U/L — ABNORMAL HIGH (ref 94–250)

## 2011-09-16 MED ORDER — SODIUM CHLORIDE 0.9 % IJ SOLN
INTRAMUSCULAR | Status: AC
  Filled 2011-09-16: qty 20

## 2011-09-16 MED ORDER — HEPARIN SOD (PORK) LOCK FLUSH 100 UNIT/ML IV SOLN
INTRAVENOUS | Status: AC
  Filled 2011-09-16: qty 5

## 2011-09-16 MED ORDER — HEPARIN SOD (PORK) LOCK FLUSH 100 UNIT/ML IV SOLN
500.0000 [IU] | Freq: Once | INTRAVENOUS | Status: AC
  Administered 2011-09-16: 500 [IU] via INTRAVENOUS
  Filled 2011-09-16: qty 5

## 2011-09-16 MED ORDER — SODIUM CHLORIDE 0.9 % IJ SOLN
20.0000 mL | INTRAMUSCULAR | Status: DC | PRN
  Administered 2011-09-16: 20 mL via INTRAVENOUS
  Filled 2011-09-16: qty 20

## 2011-09-16 NOTE — Progress Notes (Signed)
[name] presented for Portacath access and flush. Proper placement of portacath confirmed by CXR. Portacath located right chest wall accessed with  H 20 needle. Good blood return present, patient has to hold breath for blood return.  Specimen drawn for labs.   Portacath flushed with 20ml NS and 500U/39ml Heparin and needle removed intact. Procedure without incident. Patient tolerated procedure well.

## 2011-09-20 ENCOUNTER — Telehealth (HOSPITAL_COMMUNITY): Payer: Self-pay | Admitting: *Deleted

## 2011-09-20 ENCOUNTER — Encounter (HOSPITAL_BASED_OUTPATIENT_CLINIC_OR_DEPARTMENT_OTHER): Payer: Medicare Other | Admitting: Oncology

## 2011-09-20 ENCOUNTER — Telehealth: Payer: Self-pay | Admitting: Cardiology

## 2011-09-20 VITALS — BP 108/64 | HR 72 | Temp 97.6°F | Ht 70.0 in | Wt 239.0 lb

## 2011-09-20 DIAGNOSIS — C9110 Chronic lymphocytic leukemia of B-cell type not having achieved remission: Secondary | ICD-10-CM

## 2011-09-20 LAB — CBC
Hemoglobin: 10 g/dL — ABNORMAL LOW (ref 13.0–17.0)
MCH: 36.2 pg — ABNORMAL HIGH (ref 26.0–34.0)
MCV: 110.9 fL — ABNORMAL HIGH (ref 78.0–100.0)
Platelets: 48 10*3/uL — ABNORMAL LOW (ref 150–400)
RBC: 2.76 MIL/uL — ABNORMAL LOW (ref 4.22–5.81)
WBC: 149.1 10*3/uL (ref 4.0–10.5)

## 2011-09-20 LAB — DIFFERENTIAL
Eosinophils Absolute: 1.2 10*3/uL — ABNORMAL HIGH (ref 0.0–0.7)
Eosinophils Relative: 1 % (ref 0–5)
Lymphocytes Relative: 95 % — ABNORMAL HIGH (ref 12–46)
Lymphs Abs: 141.9 10*3/uL — ABNORMAL HIGH (ref 0.7–4.0)
Monocytes Relative: 3 % (ref 3–12)

## 2011-09-20 NOTE — Progress Notes (Signed)
[name] Overman, MD [address], [address] [place] [address]  1. CLL  CBC, Differential, CBC, Differential, CBC, Differential, CBC, Differential    CURRENT THERAPY:Observation  INTERVAL HISTORY: [name] 66 y.o. male returns for  regular  visit for followup of  Recurrent CLL  I personally reviewed and went over laboratory results with the patient.  His WBC count is elevated to 154.  His platelet count and Hgb remain stable.  We performed a stat CBC diff today and that is pending.   So we spent some time discussing how the patient feels.  He reports he feels very well other than his fatigue which is chronic.  It is no worse today compared to 1 and 3 months ago.  He also reports some sweats, but denies true night sweats.  He explains that his appetite is around 70%.  He is down to 239 lbs compared to 250 in Jan 2013.    I discussed the patient's case with Dr. [name] and he would like to perform weekly lab work x 2 weeks and then have him return.  He mentioned that he wishes to speak with Dr. Erma Heritage at [facility] regarding [name]'s case.  Dr. [name] thinks that he may have to treat the patient in the future and this may prove to be difficult in light of his past treatment for Hodgkin's Disease.   He denies any B symptoms including fevers, chills, true night sweats, appetite loss.  He does report to sweats that are not exclusive to night time that causes the back of his shirt and chest to get wet.  These are not drenching, soaking sweats.   Past Medical History  Diagnosis Date  . Aortic stenosis   . Campath-induced atrial fibrillation   . Coronary atherosclerosis of native coronary artery   . Hyperlipidemia   . Essential hypertension, benign   . BPH (benign prostatic hypertrophy)   . Elevated WBC count   . Arthritis   . Acid reflux   . CLL (chronic lymphoblastic leukemia)   . Hodgkin's disease   . Hypogammaglobulinemia   . Pre-diabetes 08/04/2011    has  HODGKIN'S DISEASE; CLL; HYPERLIPIDEMIA; OBESITY; CORONARY ATHEROSCLEROSIS NATIVE CORONARY ARTERY; AORTIC STENOSIS; Campath-induced atrial fibrillation; FEVER UNSPECIFIED; OTHER DYSPHAGIA; AORTIC VALVE REPLACEMENT, HX OF; Essential hypertension, benign; NECK PAIN, LEFT; GERD (gastroesophageal reflux disease); Thrombocytopenia; Hypogammaglobulinemia, acquired; Anemia; Tachyarrhythmia; Pre-diabetes; UTI (lower urinary tract infection); and Cellulitis of left thigh on his problem list.      has no known allergies.  Mr. Rister does not currently have medications on file.  Past Surgical History  Procedure Date  . Eye surgery   . Aortic valve replacement 10/09    23mm Magna Pericardial   . Esophagogastroduodenoscopy     scrapping of throat and stretching  . Lithotripsy   . Bone marrow aspiration   . Portacath placement   . Colon surgery   . Coronary artery bypass graft 10/09    SVG to RCA    Denies any headaches, dizziness, double vision, fevers, chills, night sweats, nausea, vomiting, diarrhea, constipation, chest pain, heart palpitations, shortness of breath, blood in stool, black tarry stool, urinary pain, urinary burning, urinary frequency, hematuria.   PHYSICAL EXAMINATION  ECOG PERFORMANCE STATUS: 1 - Symptomatic but completely ambulatory  Filed Vitals:   09/20/11 1127  BP: 108/64  Pulse: 72  Temp: 97.6 F (36.4 C)    GENERAL:alert, no distress, well nourished, well developed, comfortable, cooperative, obese and smiling SKIN: skin  color, texture, turgor are normal, no rashes or significant lesions HEAD: Normocephalic, No masses, lesions, tenderness or abnormalities EYES: normal, Conjunctiva are pink and non-injected EARS: External ears normal OROPHARYNX:lips, buccal mucosa, and tongue normal, mucous membranes are moist and poor dentition  NECK: supple, no adenopathy, thyroid normal size, non-tender, without nodularity, no stridor, non-tender, trachea midline LYMPH:  no  palpable lymphadenopathy BREAST:not examined LUNGS: clear to auscultation and percussion HEART: regular rate & rhythm, no murmurs, no gallops, S1 normal and S2 normal ABDOMEN:abdomen soft, non-tender, obese, normal bowel sounds, no masses or organomegaly, difficult to assess for hepatosplenomegaly due to body habitus.  BACK: Back symmetric, no curvature., No CVA tenderness EXTREMITIES:less then 2 second capillary refill, no joint deformities, effusion, or inflammation, no skin discoloration, no clubbing, no cyanosis, positive findings:  edema 1+ ankle and pedal pitting edema.   NEURO: alert & oriented x 3 with fluent speech, no focal motor/sensory deficits, gait normal  LABORATORY DATA: CBC    Component Value Date/Time   WBC 149.1* 09/20/2011 1121   RBC 2.76* 09/20/2011 1121   HGB 10.0* 09/20/2011 1121   HCT 30.6* 09/20/2011 1121   PLT 48* 09/20/2011 1121   MCV 110.9* 09/20/2011 1121   MCH 36.2* 09/20/2011 1121   MCHC 32.7 09/20/2011 1121   RDW 16.4* 09/20/2011 1121   LYMPHSABS 141.9* 09/20/2011 1121   MONOABS 4.2* 09/20/2011 1121   EOSABS 1.2* 09/20/2011 1121   BASOSABS 0.7* 09/20/2011 1121    Results for ADIR, SCHICKER (MRN [id number]) as of 09/20/2011 12:14  Ref. Range 09/16/2011 11:40  WBC Latest Range: 4.0-10.5 K/uL 154.4 (HH)  RBC Latest Range: 4.22-5.81 MIL/uL 2.68 (L)  Hemoglobin Latest Range: 13.0-17.0 g/dL 9.5 (L)  HCT Latest Range: 39.0-52.0 % 30.3 (L)  MCV Latest Range: 78.0-100.0 fL 113.1 (H)  MCH Latest Range: 26.0-34.0 pg 35.4 (H)  MCHC Latest Range: 30.0-36.0 g/dL 78.2  RDW Latest Range: 11.5-15.5 % 16.8 (H)  Platelets Latest Range: 150-400 K/uL 48 (L)  Neutrophils Relative Latest Range: 43-77 % 1 (L)  Lymphocytes Relative Latest Range: 12-46 % 96 (H)  Monocytes Relative Latest Range: 3-12 % 2 (L)  Eosinophils Relative Latest Range: 0-5 % 1  Basophils Relative Latest Range: 0-1 % 0  NEUT# Latest Range: 1.7-7.7 K/uL 1.5 (L)  Lymphocytes Absolute Latest Range: 0.7-4.0  K/uL 148.3 (H)  Monocytes Absolute Latest Range: 0.1-1.0 K/uL 3.1 (H)  Eosinophils Absolute Latest Range: 0.0-0.7 K/uL 1.5 (H)  Basophils Absolute Latest Range: 0.0-0.1 K/uL 0.0  WBC Morphology No range found WHITE COUNT CONFIRMED ON SMEAR  Smear Review No range found PLATELET COUNT CONFIRMED BY SMEAR  Glucose Latest Range: 70-99 mg/dL 956 (H)      ASSESSMENT:  1. Recurrent CLL 2. H/O Hodgkin's disease 3. Fatigue   PLAN:  1. I personally reviewed and went over laboratory results with the patient. 2. Lab work weekly: CBC diff x 2 weeks. 3. Patient informed that he may require treatment for his recurrent CLL. 4. Return in 2 weeks for follow-up.   All questions were answered. The patient knows to call the clinic with any problems, questions or concerns. We can certainly see the patient much sooner if necessary.  The patient and plan discussed with Glenford Peers, MD and he is in agreement with the aforementioned.  Geraldine Sandberg

## 2011-09-20 NOTE — Telephone Encounter (Signed)
**Note De-Identified [name] Obfuscation** Pt. Is advised to have Lipid and LFT's drawn by Friday 6-28, he verbalized understanding. Lab orders faxed to [facility] lab./LV

## 2011-09-20 NOTE — Telephone Encounter (Signed)
CRITICAL VALUE ALERT Critical value received:  WBC=149.1 Date of notification:  09/20/2011 Time of notification: 1300 Critical value read back:  yes Nurse who received alert:  TAR MD notified (1st page):  kefalas

## 2011-09-20 NOTE — Telephone Encounter (Signed)
PT NEEDS TO KNOW IF HE NEEDS LABS DONE BEFORE APPT Monday 7/1 WITH DR MCDOWELL. HE STATES HE HAS HAD TONS DONE BY OTHER DOCTORS IN THE LAST COUPLE WEEKS.[name]

## 2011-09-20 NOTE — Patient Instructions (Addendum)
[facility]  Discharge Instructions [name]  [id number] 1946-02-02 Dr. Glenford Peers  RECOMMENDATIONS MADE BY THE CONSULTANT AND ANY TEST RESULTS WILL BE SENT TO YOUR REFERRING DOCTOR.   EXAM FINDINGS BY MD TODAY AND SIGNS AND SYMPTOMS TO REPORT TO CLINIC OR PRIMARY MD: Concerned over your labwork. White blood cells are rising and platelets are low   INSTRUCTIONS GIVEN AND DISCUSSED: We will do labs weekly   SPECIAL INSTRUCTIONS/FOLLOW-UP: Return to Clinic to see MD in 2 weeks   I acknowledge that I have been informed and understand all the instructions given to me and received a copy. I do not have any more questions at this time, but understand that I may call the Specialty Clinic at [facility] at [phone] during business hours should I have any further questions or need assistance in obtaining follow-up care.    __________________________________________  _____________  __________ Signature of Patient or Authorized Representative            Date                   Time    __________________________________________ Nurse's Signature

## 2011-09-22 ENCOUNTER — Other Ambulatory Visit: Payer: Self-pay | Admitting: Cardiology

## 2011-09-23 LAB — LIPID PANEL
HDL: 16 mg/dL — ABNORMAL LOW (ref >39)
LDL Cholesterol: 64 mg/dL (ref 0–99)
Total CHOL/HDL Ratio: 8.5 Ratio
Triglycerides: 279 mg/dL — ABNORMAL HIGH (ref <150)
VLDL: 56 mg/dL — ABNORMAL HIGH (ref 0–40)

## 2011-09-23 LAB — HEPATIC FUNCTION PANEL
Bilirubin, Direct: 0.1 mg/dL (ref 0.0–0.3)
Indirect Bilirubin: 0.3 mg/dL (ref 0.0–0.9)

## 2011-09-27 ENCOUNTER — Ambulatory Visit (INDEPENDENT_AMBULATORY_CARE_PROVIDER_SITE_OTHER): Payer: Medicare Other | Admitting: Cardiology

## 2011-09-27 ENCOUNTER — Encounter: Payer: Self-pay | Admitting: Cardiology

## 2011-09-27 VITALS — BP 112/70 | HR 60 | Resp 16 | Ht 70.0 in | Wt 241.0 lb

## 2011-09-27 DIAGNOSIS — I4891 Unspecified atrial fibrillation: Secondary | ICD-10-CM

## 2011-09-27 DIAGNOSIS — E785 Hyperlipidemia, unspecified: Secondary | ICD-10-CM

## 2011-09-27 DIAGNOSIS — Z954 Presence of other heart-valve replacement: Secondary | ICD-10-CM

## 2011-09-27 DIAGNOSIS — I2510 Atherosclerotic heart disease of native coronary artery without angina pectoris: Secondary | ICD-10-CM

## 2011-09-27 NOTE — Progress Notes (Signed)
Clinical Summary Mr. Cullens is a 66 y.o.male presenting for followup. He was seen back in January. Interval history includes hospitalization in May with sepsis in the setting of cellulitis. 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 He was treated with IVIG during his hospital stay, had some paroxysmal atrial fibrillation at that time. Fortunately no angina or heart failure symptoms.  Recent lab work showed normal ALT of 29, mildly increased AST of 38, improved triglycerides down to 279, cholesterol 136, HDL 16, LDL 64. 10 he states he has been taking omega-3 supplements with his Crestor.  He reports no significant bleeding problems. He has been treated with aspirin, not anticoagulant therapy, which is certainly reasonable in light of his medical complexity recently. Is being considered for treatment options related to recurrent CLL.   No Known Allergies  Current Outpatient Prescriptions  Medication Sig Dispense Refill  . acetaminophen (TYLENOL) 500 MG tablet Take 1,000 mg by mouth once as needed. For pain       . acyclovir (ZOVIRAX) 200 MG capsule Take 200 mg by mouth 2 (two) times daily. Take one tablet by mouth twice a day       . aspirin (ASPIRIN LOW DOSE) 81 MG EC tablet Take 81 mg by mouth daily. Take one tablet by mouth daily       . folic acid (FOLVITE) 1 MG tablet Take 1 mg by mouth daily.      . metoprolol (LOPRESSOR) 50 MG tablet Take 50 mg by mouth 2 (two) times daily.      . Multiple Vitamins-Minerals (CENTRUM SILVER ULTRA MENS PO) Take 1 tablet by mouth daily.      . OMEGA 3 1000 MG CAPS Take 1,000 mg by mouth every morning.       Marland Kitchen omeprazole (PRILOSEC) 20 MG capsule Take 20 mg by mouth every morning.       . rosuvastatin (CRESTOR) 40 MG tablet Take 40 mg by mouth daily.        Past Medical History  Diagnosis Date  . Aortic stenosis   . Atrial fibrillation   . Coronary atherosclerosis of native coronary artery   . Hyperlipidemia   .  Essential hypertension, benign   . BPH (benign prostatic hypertrophy)   . Elevated WBC count   . Arthritis   . Acid reflux   . CLL (chronic lymphoblastic leukemia)   . Hodgkin's disease   . Hypogammaglobulinemia   . Pre-diabetes 08/04/2011    Past Surgical History  Procedure Date  . Eye surgery   . Aortic valve replacement 10/09    23mm Magna Pericardial   . Esophagogastroduodenoscopy     scrapping of throat and stretching  . Lithotripsy   . Bone marrow aspiration   . Portacath placement   . Colon surgery   . Coronary artery bypass graft 10/09    SVG to RCA    Social History Mr. Husak reports that he has never smoked. He has never used smokeless tobacco. Mr. Frith reports that he does not drink alcohol.  Review of Systems No palpitations, no falls. Stable appetite. Chronic dyspnea exertion is unchanged. Otherwise negative.  Physical Examination Filed Vitals:   09/27/11 1308  BP: 112/70  Pulse: 60  Resp: 16   Morbidly obese male in no acute distress.  HEENT: Conjunctiva and lids normal, oropharynx with poor dentition.  Neck: Supple, no elevated jugular venous pressure or bruits.  Lungs: Clear to auscultation, nonlabored.  Cardiac: Regular rate and rhythm, S4, 2/6  systolic murmur at the base, no S3.  Abdomen: Obese, nontender, bowel sounds present.  Extremities: Venous stasis noted, 1-2+ edema, distal pulses diminished.  Skin: Warm and dry, scattered excoriations.  Musculoskeletal: No gross deformities.  Neuropsychiatric: Alert and oriented x3, affect grossly normal.   Problem List and Plan   CORONARY ATHEROSCLEROSIS NATIVE CORONARY ARTERY Symptomatically stable status post prior SVG to RCA. Continue medical therapy and observation. Patient on aspirin, beta blocker, statin. Left bundle-branch block by ECG.  AORTIC VALVE REPLACEMENT, HX OF Status post bioprosthetic aortic valve replacement. Normal function by echocardiogram within the last 2 years.  Atrial  fibrillation Paroxysmal episodes noted during acute illness in May. He is in sinus rhythm on examination today. Will hold off anticoagulation in light of his medical complexity and recurring CLL which may require additional treatment. Continue aspirin for now.    Jonelle Sidle, M.D., F.A.C.C.

## 2011-09-27 NOTE — Patient Instructions (Addendum)
**Note De-Identified [name] Obfuscation** Your physician recommends that you continue on your current medications as directed. Please refer to the Current Medication list given to you today.  Your physician recommends that you return for lab work in: 6 months (just before next visit)  Your physician recommends that you schedule a follow-up appointment in: 6 months

## 2011-09-27 NOTE — Assessment & Plan Note (Signed)
Paroxysmal episodes noted during acute illness in May. He is in sinus rhythm on examination today. Will hold off anticoagulation in light of his medical complexity and recurring CLL which may require additional treatment. Continue aspirin for now.

## 2011-09-27 NOTE — Assessment & Plan Note (Addendum)
Status post bioprosthetic aortic valve replacement. Normal function by echocardiogram within the last 2 years.

## 2011-09-27 NOTE — Assessment & Plan Note (Addendum)
Symptomatically stable status post prior SVG to RCA. Continue medical therapy and observation. Patient on aspirin, beta blocker, statin. Left bundle-branch block by ECG.

## 2011-09-28 ENCOUNTER — Encounter (HOSPITAL_COMMUNITY): Payer: Medicare Other | Attending: Oncology

## 2011-09-28 ENCOUNTER — Encounter (HOSPITAL_COMMUNITY): Payer: Self-pay | Admitting: Oncology

## 2011-09-28 DIAGNOSIS — C8190 Hodgkin lymphoma, unspecified, unspecified site: Secondary | ICD-10-CM | POA: Insufficient documentation

## 2011-09-28 DIAGNOSIS — C9110 Chronic lymphocytic leukemia of B-cell type not having achieved remission: Secondary | ICD-10-CM

## 2011-09-28 LAB — DIFFERENTIAL
Basophils Absolute: 0.9 10*3/uL — ABNORMAL HIGH (ref 0.0–0.1)
Basophils Relative: 1 % (ref 0–1)
Monocytes Absolute: 3.3 10*3/uL — ABNORMAL HIGH (ref 0.1–1.0)
Neutro Abs: 1.9 10*3/uL (ref 1.7–7.7)
Neutrophils Relative %: 1 % — ABNORMAL LOW (ref 43–77)

## 2011-09-28 LAB — CBC
MCHC: 33.3 g/dL (ref 30.0–36.0)
RDW: 16.6 % — ABNORMAL HIGH (ref 11.5–15.5)

## 2011-09-28 NOTE — Progress Notes (Signed)
CRITICAL VALUE ALERT Critical value received:  WBC 172.6 Date of notification:  09/28/11 Time of notification: 1150 Critical value read back:  yes Nurse who received alert:  Abner Greenspan, RN MD notified:  [name]   09/28/2011 1200 Per Dr. [name], patient needs to be scheduled to be seen in the office next week - he has an existing appointment with T. Jacalyn Lefevre, PA-C for 10/04/11.  No further follow-up taken at this time.

## 2011-09-28 NOTE — Progress Notes (Signed)
Lab draw

## 2011-10-04 ENCOUNTER — Encounter (HOSPITAL_BASED_OUTPATIENT_CLINIC_OR_DEPARTMENT_OTHER): Payer: Medicare Other | Admitting: Oncology

## 2011-10-04 ENCOUNTER — Other Ambulatory Visit (HOSPITAL_COMMUNITY): Payer: Self-pay | Admitting: Oncology

## 2011-10-04 VITALS — BP 88/62 | HR 76 | Temp 98.1°F | Ht 70.0 in | Wt 237.0 lb

## 2011-10-04 DIAGNOSIS — D696 Thrombocytopenia, unspecified: Secondary | ICD-10-CM

## 2011-10-04 DIAGNOSIS — C9110 Chronic lymphocytic leukemia of B-cell type not having achieved remission: Secondary | ICD-10-CM

## 2011-10-04 DIAGNOSIS — C8190 Hodgkin lymphoma, unspecified, unspecified site: Secondary | ICD-10-CM

## 2011-10-04 DIAGNOSIS — D801 Nonfamilial hypogammaglobulinemia: Secondary | ICD-10-CM

## 2011-10-04 MED ORDER — ALLOPURINOL 300 MG PO TABS: 300.0000 mg | ORAL_TABLET | Freq: Every day | ORAL | Status: DC

## 2011-10-04 NOTE — Patient Instructions (Addendum)
[name]  [id number] January 03, 1946 Dr. Glenford Peers [facility]  Discharge Instructions  RECOMMENDATIONS MADE BY THE CONSULTANT AND ANY TEST RESULTS WILL BE SENT TO YOUR REFERRING DOCTOR.   EXAM FINDINGS BY MD TODAY AND SIGNS AND SYMPTOMS TO REPORT TO CLINIC OR PRIMARY MD: exam and discussion per MD and PA.  Plans are to re-scan and start chemotherapy using Bendumustine and Rituxan.    MEDICATIONS PRESCRIBED: Allopurinol - start this when you get it from the pharmacy. Follow label directions  INSTRUCTIONS GIVEN AND DISCUSSED: Other :  We will call you with a date and time for chemotherapy teaching.  SPECIAL INSTRUCTIONS/FOLLOW-UP: Xray Studies Needed 7/10  and Return to Clinic for chemo on 7/17 and to be seen in follow-up in 3 weeks.   I acknowledge that I have been informed and understand all the instructions given to me and received a copy. I do not have any more questions at this time, but understand that I may call the Specialty Clinic at [facility] at [phone] during business hours should I have any further questions or need assistance in obtaining follow-up care.    __________________________________________  _____________  __________ Signature of Patient or Authorized Representative            Date                   Time    __________________________________________ Nurse's Signature

## 2011-10-04 NOTE — Progress Notes (Signed)
[name] Overman, MD [address] [place] [address]  1. CLL  CT Chest W Contrast, CT Abdomen Pelvis W Contrast    CURRENT THERAPY: Observation  INTERVAL HISTORY: [name] 66 y.o. male returns for  regular  visit for followup of  Recurrent chronic lymphocytic leukemia.   I personally reviewed and went over laboratory results with the patient.  His WBC count continues to rise with absolute lymphocytosis.  In light of his WBC count of 172.6, we will embark on therapy.  He is heavily pre-treated before and therefore we will treat with bendamustine/rituxan every 28 days at a 85% dose reduction.  Patient is agreeable to this plan.  Port-a-cath already inserted from the past. Patient education regarding treatment provided.   Patient continues to feel fatigued, but denies any B symptoms. Physical exam does not reveal any adenopathy.   Dr. Mariel [name] has placed a call with Dr. Miachel [name] at [facility] to discuss this patient's case with him.   Past Medical History  Diagnosis Date  . Aortic stenosis   . Atrial fibrillation   . Coronary atherosclerosis of native coronary artery   . Hyperlipidemia   . Essential hypertension, benign   . BPH (benign prostatic hypertrophy)   . Elevated WBC count   . Arthritis   . Acid reflux   . CLL (chronic lymphoblastic leukemia)   . Hodgkin's disease   . Hypogammaglobulinemia   . Pre-diabetes 08/04/2011    has HODGKIN'S DISEASE; CLL; HYPERLIPIDEMIA; OBESITY; CORONARY ATHEROSCLEROSIS NATIVE CORONARY ARTERY; AORTIC STENOSIS; Atrial fibrillation; FEVER UNSPECIFIED; OTHER DYSPHAGIA; AORTIC VALVE REPLACEMENT, HX OF; Essential hypertension, benign; NECK PAIN, LEFT; GERD (gastroesophageal reflux disease); Thrombocytopenia; Hypogammaglobulinemia, acquired; Anemia; Tachyarrhythmia; Pre-diabetes; UTI (lower urinary tract infection); and Cellulitis of left thigh on his problem list.      has no known allergies.  [name] does not  currently have medications on file.  Past Surgical History  Procedure Date  . Eye surgery   . Aortic valve replacement 10/09    23mm Magna Pericardial   . Esophagogastroduodenoscopy     scrapping of throat and stretching  . Lithotripsy   . Bone marrow aspiration   . Portacath placement   . Colon surgery   . Coronary artery bypass graft 10/09    SVG to RCA    Denies any headaches, dizziness, double vision, fevers, chills, night sweats, nausea, vomiting, diarrhea, constipation, chest pain, heart palpitations, shortness of breath, blood in stool, black tarry stool, urinary pain, urinary burning, urinary frequency, hematuria.   PHYSICAL EXAMINATION  ECOG PERFORMANCE STATUS: 1 - Symptomatic but completely ambulatory  Filed Vitals:   10/04/11 1317  BP: 88/62  Pulse: 76  Temp: 98.1 F (36.7 C)    GENERAL:alert, no distress, well nourished, well developed, comfortable, cooperative, obese and flat affect SKIN: skin color, texture, turgor are normal, no rashes or significant lesions HEAD: Normocephalic, No masses, lesions, tenderness or abnormalities EYES: normal, Conjunctiva are pink and non-injected EARS: External ears normal OROPHARYNX:lips, buccal mucosa, and tongue normal and mucous membranes are moist  NECK: supple, no adenopathy, thyroid normal size, non-tender, without nodularity, no stridor, non-tender, trachea midline LYMPH:  no palpable lymphadenopathy BREAST:not examined LUNGS: clear to auscultation and percussion HEART: regular rate & rhythm, no murmurs, no gallops, S1 normal and S2 normal ABDOMEN:abdomen soft, non-tender and normal bowel sounds BACK: Back symmetric, no curvature. EXTREMITIES:less then 2 second capillary refill, no joint deformities, effusion, or inflammation, no skin discoloration, no clubbing, no  cyanosis  NEURO: alert & oriented x 3 with fluent speech, no focal motor/sensory deficits, gait normal   LABORATORY DATA: CBC    Component Value  Date/Time   WBC 172.6* 09/28/2011 1054   RBC 2.70* 09/28/2011 1054   HGB 9.9* 09/28/2011 1054   HCT 29.7* 09/28/2011 1054   PLT 49* 09/28/2011 1054   MCV 110.0* 09/28/2011 1054   MCH 36.7* 09/28/2011 1054   MCHC 33.3 09/28/2011 1054   RDW 16.6* 09/28/2011 1054   LYMPHSABS 165.4* 09/28/2011 1054   MONOABS 3.3* 09/28/2011 1054   EOSABS 1.2* 09/28/2011 1054   BASOSABS 0.9* 09/28/2011 1054      ASSESSMENT:  1. Recurrent chronic lymphocytic leukemia.  2. 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 He is in a complete remission from the Hodgkin's.  3. Status post fludarabine, Cytoxan, and rituximab therapy for 3 cycles when he presented with CLL in 2004.  4. Obesity.  5. Insect bites, multiple places on his body.  6. Possible early onset diabetes mellitus with mild elevation in his sugars lately. We will check hemoglobin A1c next time he is here.  7. Pan-hypogammaglobulinemia.  8. Severe deconditioning.  9. History of Agent Orange exposure during the [place] War and he states that just within the last 2 months he has been granted full disability from the [place].  10.Coronary artery disease, status post bypass grafting x1 in October 2009.  11.Peripheral vascular disease.  12.Aortic stenosis with aortic valve replacement.  13.Benign prostatic hypertrophy.  14.Hyperlipidemia.  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  16.Circumcision in the past, leaving him with difficulty with a uniform stream and occasional incontinence.  17.Thrombocytopenia persistent ever since his chemotherapy for his Hodgkin disease, but getting slightly worse.   PLAN:  1. I personally reviewed and went over laboratory results with the patient. 2. CT CAP with contrast for baseline staging. 3. Lab work: Hepatitis B surface antigen and  Hepatitis B core antigen.  4. Chemotherapy consisting of Bendamustine days 1 and 2 every 28 days to be started next week.  85% dose reduction. 5. Bendamustine chemotherapy plan built. 6. Antibody consisting of Rituxan 500 mg/m2 for the first cycle followed by Rituxan 375 mg/m2 subsequently every 28 days on day 1 of therapy.  7. Antibody plan built. 8. Rx for Allopurinol 300 mg PO daily e-scribed to pharmacy. 9. Port already available on the left side.  10. Patient education regarding chemotherapy.  11. Dr. Mariel [name] has placed a call to Dr. Miachel [name] at [facility] to discuss this patient case and see if a new chemotherapeutic drug is available to him. Return telephone call is pending.  12.  Return in 3 weeks for follow-up.   All questions were answered. The patient knows to call the clinic with any problems, questions or concerns. We can certainly see the patient much sooner if necessary.  The patient and plan discussed with Glenford Peers, MD and he is in agreement with the aforementioned.  I spent 25 minutes counseling the patient face to face. The total time spent in the appointment was 40 minutes.  Anjanette Gilkey

## 2011-10-05 MED ORDER — ONDANSETRON HCL 8 MG PO TABS: ORAL_TABLET | ORAL | Status: DC

## 2011-10-05 MED ORDER — DEXAMETHASONE 4 MG PO TABS: ORAL_TABLET | ORAL | Status: DC

## 2011-10-05 MED ORDER — PROCHLORPERAZINE MALEATE 10 MG PO TABS: 10.0000 mg | ORAL_TABLET | Freq: Four times a day (QID) | ORAL | Status: DC | PRN

## 2011-10-05 MED ORDER — LORAZEPAM 1 MG PO TABS: 1.0000 mg | ORAL_TABLET | ORAL | Status: AC | PRN

## 2011-10-05 NOTE — Patient Instructions (Addendum)
[name]  [id number] [birth year] Dr. Glenford Peers    CHEMOTHERAPY INSTRUCTIONS  Rituxan - Before taking Rituxan you need to take Tylenol 650mg  and Benadryl 50mg  1 hour before the Rituxan. You can take this at home. This reduces your risk of having an allergic reaction to the Rituxan. You will do this each time prior to Rituxan. Side Effects: during infusion - itching, low blood pressure, low oxygen, bronchospasm, rash, trouble breathing - we need to know immediately if any of this happens. The first time you receive this drug, it takes a long time to infuse because we titrate the drug very slowly. With each Rituxan infusion, the likelihood of developing an infusion reaction decreases. You may also experience fever, chills, shaking chills, headaches, muscle aches, nausea, rash, and a low white blood cell count. We need to be sure that you are drinking plenty of fluids - preferably 64oz of decaff fluids/water daily. It is best to start drinking fluids 2 days prior to treatment and for up to 4-5 days after treatment. As your tumor breaks down, it leaves behind uric acid and the extra fluid that you drink helps to flush this out of your body. You will also be on a medication called Allopurinol while taking Rituxan which helps rid your body of the uric acid. It is important that you take this medication daily as prescribed.    Bendamustine - neutropenia (low white blood cell count) - this puts you at risk for infection because your white blood cells are what protect you from infection. Thrombocytopenia - low platelets (platelets are what help your blood to clot), nausea/vomiting.   POTENTIAL SIDE EFFECTS OF TREATMENT: Increased Susceptibility to Infection, Constipation, Changes in Character of Skin and Nails (brittleness, dryness,etc.), Bone Marrow Suppression, Nausea, Sun Sensitivity and Mouth Sores   EDUCATIONAL MATERIALS GIVEN AND  REVIEWED: Chemotherapy and You booklet Specific Instructions Sheets: Rituxan/Bendamustine/dexamethasone, zofran, ativan, compazine, zofran   SELF CARE ACTIVITIES WHILE ON CHEMOTHERAPY: Increase your fluid intake 48 hours prior to treatment and drink at least 2 quarts per day after treatment., No alcohol intake., No aspirin or other medications unless approved by your oncologist., Eat foods that are light and easy to digest., Eat foods at cold or room temperature., No fried, fatty, or spicy foods immediately before or after treatment., Have teeth cleaned professionally before starting treatment. Keep dentures and partial plates clean., Use soft toothbrush and do not use mouthwashes that contain alcohol. Biotene is a good mouthwash that is available at most pharmacies or may be ordered by calling [phone]., Use warm salt water gargles (1 teaspoon salt per 1 quart warm water) before and after meals and at bedtime. Or you may rinse with 2 tablespoons of three -percent hydrogen peroxide mixed in eight ounces of water., Always use sunscreen with SPF (Sun Protection Factor) of 30 or higher., Use your nausea medication as directed to prevent nausea. and Use your stool softener or laxative as directed to prevent constipation.  Please wash your hands for at least 30 seconds using warm soapy water. Handwashing is the #1 way to prevent the spread of germs. Stay away from sick people or people who are getting over a cold. If you develop respiratory systems such as green/yellow mucus production or productive cough or persistent cough let us know and we will see if you need an antibiotic. It is a good idea to keep a pair of gloves on when going into grocery stores/[employer]  to decrease your risk of coming into contact with germs on the carts, etc. Carry alcohol hand gel with you at all times and use it frequently if out in public. All foods need to be cooked thoroughly. No raw foods. No medium or undercooked meats,  eggs. If your food is cooked medium well, it does not need to be hot pink or saturated with bloody liquid at all. Vegetables and fruits need to be washed/rinsed under the faucet with a dish detergent before being consumed. You can eat raw fruits and vegetables unless we tell you otherwise but it would be best if you cooked them or bought frozen. Do not eat off of salad bars or hot bars unless you really trust the cleanliness of the restaurant. If you need dental work, please let Dr. [name] know before you go for your appointment so that we can coordinate the best possible time for you in regards to your chemo regimen. You need to also let your dentist know that you are actively taking chemo. We may need to do labs prior to your dental appointment. We also want your bowels moving at least every other day. If this is not happening, we need to know so that we can get you on a bowel regimen to help you go.   MEDICATIONS: You have been given prescriptions for the following medications:  Tylenol 325mg  tablet. Take 2 tablets 1 hour prior to Rituxan.  Benadryl 25mg  tablet. Take 2 tablets 1 hour prior to Rituxan.  Allopurinol 300mg  tablet. Take 1 tablet daily. This tablet helps to rid your body of the uric acid that is broken down from your tumor. Make sure you take this everyday.  Zofran 8mg  tablet. Starting the day after chemo, take 1 tablet in the am and 1 tablet in the pm for 2 days. Then may take 1 tablet two times a day IF needed for nausea/vomiting.  Dexamethasone 4mg  tablet. Starting the day after chemo, take 2 tablets in the am for 2 days. (take with food) While taking Dexamethasone you may find that you have difficulty sleeping, be irritable, have more energy,  be nervous/jittery. This will pass once you finish taking this medication. You may even turn red in the face, neck, and chest while taking this medication. It too will pass.   Ativan 1mg  tablet. Take 1 tablet every 4 hours IF needed for  nausea/vomiting. This may make you sleepy. Do not drive or operate machinery, climb ladders, etc., while taking this medication. May put tablet under tongue and let it dissolve if you need to.  Compazine 10mg  tablet. Take 1 tablet every 6 hours IF needed for nausea/vomiting.   Over the Counter Medications:  Colace - this is a stool softener. Take 100mg  capsule 2-6 times a day as needed. If you have to take more than 6 capsules of Colace a day call the Cancer Center.  Senna - this is a mild laxative used to treat mild constipation. May take 2 tabs by mouth daily or up to twice a day as needed for mild constipation.  Milk of Magnesia - this is a laxative used to treat moderate to severe constipation. May take 2-4 tablespoons every 8 hours as needed. May increase to 8 tablespoons x 1 dose and if no bowel movement call the Cancer Center.   SYMPTOMS TO REPORT AS SOON AS POSSIBLE AFTER TREATMENT:  FEVER GREATER THAN 100.5 F  CHILLS WITH OR WITHOUT FEVER  NAUSEA AND VOMITING THAT IS NOT CONTROLLED  WITH YOUR NAUSEA MEDICATION  UNUSUAL SHORTNESS OF BREATH  UNUSUAL BRUISING OR BLEEDING  TENDERNESS IN MOUTH AND THROAT WITH OR WITHOUT PRESENCE OF ULCERS  URINARY PROBLEMS  BOWEL PROBLEMS  UNUSUAL RASH    Wear comfortable clothing and clothing appropriate for easy access to any Portacath or PICC line. Let us know if there is anything that we can do to make your therapy better!      I have been informed and understand all of the instructions given to me and have received a copy. I have been instructed to call the clinic [phone] or my family physician as soon as possible for continued medical care, if indicated. I do not have any more questions at this time but understand that I may call the Cancer Center or the Patient Navigator at [phone] during office hours should I have questions or need assistance in obtaining follow-up  care.      _________________________________________      _______________     __________ Signature of Patient or Authorized Representative        Date                            Time      _________________________________________ Nurse's Signature

## 2011-10-06 ENCOUNTER — Other Ambulatory Visit (HOSPITAL_COMMUNITY): Payer: Medicare Other

## 2011-10-06 ENCOUNTER — Ambulatory Visit (HOSPITAL_COMMUNITY)
Admission: RE | Admit: 2011-10-06 | Discharge: 2011-10-06 | Disposition: A | Discharge status: Home / Self Care | Payer: Medicare Other | Source: Ambulatory Visit | Attending: Oncology | Admitting: Oncology

## 2011-10-06 DIAGNOSIS — R918 Other nonspecific abnormal finding of lung field: Secondary | ICD-10-CM | POA: Insufficient documentation

## 2011-10-06 DIAGNOSIS — C9110 Chronic lymphocytic leukemia of B-cell type not having achieved remission: Secondary | ICD-10-CM

## 2011-10-06 MED ORDER — IOHEXOL 300 MG/ML  SOLN
100.0000 mL | Freq: Once | INTRAMUSCULAR | Status: AC | PRN
  Administered 2011-10-06: 100 mL via INTRAVENOUS

## 2011-10-07 ENCOUNTER — Ambulatory Visit (HOSPITAL_COMMUNITY): Payer: Medicare Other

## 2011-10-08 ENCOUNTER — Encounter (HOSPITAL_BASED_OUTPATIENT_CLINIC_OR_DEPARTMENT_OTHER): Payer: Medicare Other

## 2011-10-08 DIAGNOSIS — C9110 Chronic lymphocytic leukemia of B-cell type not having achieved remission: Secondary | ICD-10-CM

## 2011-10-08 NOTE — Progress Notes (Signed)
Bendamustine/Rituxan chemo teaching done and consent signed.   [name] presented for labwork. Labs per MD order drawn via  Peripheral Line 24 gauge needle inserted in rt ac  Good blood return present. Procedure without incident.  Needle removed intact. Patient tolerated procedure well.

## 2011-10-09 LAB — HEPATITIS B SURFACE ANTIGEN: Hepatitis B Surface Ag: NEGATIVE

## 2011-10-11 ENCOUNTER — Telehealth (HOSPITAL_COMMUNITY): Payer: Self-pay | Admitting: *Deleted

## 2011-10-11 NOTE — Telephone Encounter (Signed)
Pt made aware to take Dexamethasone 2 tablets = 8mg  in the am and 2 tablets in the pm the day before Rituxan and the am of Rituxan. Pt verbalized understanding of these instructions.

## 2011-10-13 ENCOUNTER — Encounter (HOSPITAL_BASED_OUTPATIENT_CLINIC_OR_DEPARTMENT_OTHER): Payer: Medicare Other

## 2011-10-13 VITALS — BP 113/58 | HR 82 | Temp 97.6°F | Wt 239.8 lb

## 2011-10-13 DIAGNOSIS — Z5111 Encounter for antineoplastic chemotherapy: Secondary | ICD-10-CM

## 2011-10-13 DIAGNOSIS — C8190 Hodgkin lymphoma, unspecified, unspecified site: Secondary | ICD-10-CM

## 2011-10-13 DIAGNOSIS — E782 Mixed hyperlipidemia: Secondary | ICD-10-CM

## 2011-10-13 DIAGNOSIS — C9110 Chronic lymphocytic leukemia of B-cell type not having achieved remission: Secondary | ICD-10-CM

## 2011-10-13 LAB — DIFFERENTIAL
Basophils Relative: 1 % (ref 0–1)
Eosinophils Absolute: 0 10*3/uL (ref 0.0–0.7)
Monocytes Relative: 2 % — ABNORMAL LOW (ref 3–12)
Neutrophils Relative %: 2 % — ABNORMAL LOW (ref 43–77)
Smear Review: DECREASED

## 2011-10-13 LAB — BASIC METABOLIC PANEL
BUN: 29 mg/dL — ABNORMAL HIGH (ref 6–23)
Calcium: 9 mg/dL (ref 8.4–10.5)
Creatinine, Ser: 1.39 mg/dL — ABNORMAL HIGH (ref 0.50–1.35)
GFR calc non Af Amer: 51 mL/min — ABNORMAL LOW (ref >90)
Glucose, Bld: 189 mg/dL — ABNORMAL HIGH (ref 70–99)

## 2011-10-13 LAB — CBC
Hemoglobin: 8.7 g/dL — ABNORMAL LOW (ref 13.0–17.0)
MCH: 36.4 pg — ABNORMAL HIGH (ref 26.0–34.0)
MCHC: 33.1 g/dL (ref 30.0–36.0)
Platelets: 54 10*3/uL — ABNORMAL LOW (ref 150–400)

## 2011-10-13 MED ORDER — HEPARIN SOD (PORK) LOCK FLUSH 100 UNIT/ML IV SOLN
500.0000 [IU] | Freq: Once | INTRAVENOUS | Status: AC | PRN
  Administered 2011-10-13: 500 [IU]
  Filled 2011-10-13: qty 5

## 2011-10-13 MED ORDER — SODIUM CHLORIDE 0.9 % IJ SOLN
10.0000 mL | INTRAMUSCULAR | Status: DC | PRN
  Administered 2011-10-13: 10 mL
  Filled 2011-10-13: qty 10

## 2011-10-13 MED ORDER — HEPARIN SOD (PORK) LOCK FLUSH 100 UNIT/ML IV SOLN
INTRAVENOUS | Status: AC
  Filled 2011-10-13: qty 55

## 2011-10-13 MED ORDER — SODIUM CHLORIDE 0.9 % IV SOLN: 8.0000 mg | Freq: Once | INTRAVENOUS | Status: DC

## 2011-10-13 MED ORDER — SODIUM CHLORIDE 0.9 % IV SOLN
Freq: Once | INTRAVENOUS | Status: AC
  Administered 2011-10-13: 10:00:00 via INTRAVENOUS

## 2011-10-13 MED ORDER — SODIUM CHLORIDE 0.9 % IV SOLN
Freq: Once | INTRAVENOUS | Status: AC
  Administered 2011-10-13: 8 mg via INTRAVENOUS
  Filled 2011-10-13: qty 4

## 2011-10-13 MED ORDER — DEXAMETHASONE SODIUM PHOSPHATE 10 MG/ML IJ SOLN: 10.0000 mg | Freq: Once | INTRAMUSCULAR | Status: DC

## 2011-10-13 MED ORDER — SODIUM CHLORIDE 0.9 % IV SOLN
500.0000 mg/m2 | Freq: Once | INTRAVENOUS | Status: AC
  Administered 2011-10-13: 1200 mg via INTRAVENOUS
  Filled 2011-10-13: qty 120

## 2011-10-13 MED ORDER — SODIUM CHLORIDE 0.9 % IV SOLN
85.0000 mg/m2 | Freq: Once | INTRAVENOUS | Status: AC
  Administered 2011-10-13: 195 mg via INTRAVENOUS
  Filled 2011-10-13: qty 39

## 2011-10-13 NOTE — Progress Notes (Signed)
Tolerated chemo well. 

## 2011-10-14 ENCOUNTER — Ambulatory Visit: Payer: Medicare Other | Admitting: Family Medicine

## 2011-10-14 ENCOUNTER — Encounter (HOSPITAL_BASED_OUTPATIENT_CLINIC_OR_DEPARTMENT_OTHER): Payer: Medicare Other

## 2011-10-14 VITALS — BP 108/53 | HR 70 | Temp 97.7°F

## 2011-10-14 DIAGNOSIS — C9110 Chronic lymphocytic leukemia of B-cell type not having achieved remission: Secondary | ICD-10-CM

## 2011-10-14 DIAGNOSIS — Z5111 Encounter for antineoplastic chemotherapy: Secondary | ICD-10-CM

## 2011-10-14 MED ORDER — HEPARIN SOD (PORK) LOCK FLUSH 100 UNIT/ML IV SOLN
INTRAVENOUS | Status: AC
  Filled 2011-10-14: qty 5

## 2011-10-14 MED ORDER — SODIUM CHLORIDE 0.9 % IV SOLN
Freq: Once | INTRAVENOUS | Status: AC
  Administered 2011-10-14: 09:00:00 via INTRAVENOUS

## 2011-10-14 MED ORDER — SODIUM CHLORIDE 0.9 % IV SOLN: 8.0000 mg | Freq: Once | INTRAVENOUS | Status: DC

## 2011-10-14 MED ORDER — BENDAMUSTINE HCL (LYOPHILIZED PWD) CHEMO INJECTION 100MG
85.0000 mg/m2 | Freq: Once | INTRAVENOUS | Status: AC
  Administered 2011-10-14: 195 mg via INTRAVENOUS
  Filled 2011-10-14: qty 39

## 2011-10-14 MED ORDER — SODIUM CHLORIDE 0.9 % IJ SOLN
10.0000 mL | INTRAMUSCULAR | Status: DC | PRN
  Filled 2011-10-14: qty 10

## 2011-10-14 MED ORDER — DEXAMETHASONE SODIUM PHOSPHATE 10 MG/ML IJ SOLN: 10.0000 mg | Freq: Once | INTRAMUSCULAR | Status: DC

## 2011-10-14 MED ORDER — ONDANSETRON HCL 4 MG/2ML IJ SOLN
Freq: Once | INTRAMUSCULAR | Status: AC
  Administered 2011-10-14: 8 mg via INTRAVENOUS
  Filled 2011-10-14: qty 4

## 2011-10-14 MED ORDER — HEPARIN SOD (PORK) LOCK FLUSH 100 UNIT/ML IV SOLN
500.0000 [IU] | Freq: Once | INTRAVENOUS | Status: AC | PRN
  Administered 2011-10-14: 500 [IU]
  Filled 2011-10-14: qty 5

## 2011-10-14 MED ORDER — SODIUM CHLORIDE 0.9 % IJ SOLN
INTRAMUSCULAR | Status: AC
  Filled 2011-10-14: qty 20

## 2011-10-20 ENCOUNTER — Encounter (HOSPITAL_BASED_OUTPATIENT_CLINIC_OR_DEPARTMENT_OTHER): Payer: Medicare Other

## 2011-10-20 DIAGNOSIS — C8190 Hodgkin lymphoma, unspecified, unspecified site: Secondary | ICD-10-CM

## 2011-10-20 DIAGNOSIS — C9110 Chronic lymphocytic leukemia of B-cell type not having achieved remission: Secondary | ICD-10-CM

## 2011-10-20 LAB — CBC
HCT: 27.6 % — ABNORMAL LOW (ref 39.0–52.0)
MCHC: 33.7 g/dL (ref 30.0–36.0)
RDW: 16.3 % — ABNORMAL HIGH (ref 11.5–15.5)

## 2011-10-20 NOTE — Progress Notes (Signed)
Labs drawn today for cbc 

## 2011-10-26 ENCOUNTER — Encounter (HOSPITAL_COMMUNITY): Payer: Self-pay | Admitting: Oncology

## 2011-10-26 ENCOUNTER — Encounter (HOSPITAL_BASED_OUTPATIENT_CLINIC_OR_DEPARTMENT_OTHER): Payer: Medicare Other | Admitting: Oncology

## 2011-10-26 ENCOUNTER — Other Ambulatory Visit (HOSPITAL_COMMUNITY): Payer: Self-pay | Admitting: Oncology

## 2011-10-26 ENCOUNTER — Encounter (HOSPITAL_BASED_OUTPATIENT_CLINIC_OR_DEPARTMENT_OTHER): Payer: Medicare Other

## 2011-10-26 VITALS — BP 103/71 | HR 82 | Temp 97.9°F | Wt 228.4 lb

## 2011-10-26 DIAGNOSIS — D801 Nonfamilial hypogammaglobulinemia: Secondary | ICD-10-CM

## 2011-10-26 DIAGNOSIS — D649 Anemia, unspecified: Secondary | ICD-10-CM

## 2011-10-26 DIAGNOSIS — C9110 Chronic lymphocytic leukemia of B-cell type not having achieved remission: Secondary | ICD-10-CM

## 2011-10-26 DIAGNOSIS — E860 Dehydration: Secondary | ICD-10-CM

## 2011-10-26 DIAGNOSIS — C8190 Hodgkin lymphoma, unspecified, unspecified site: Secondary | ICD-10-CM

## 2011-10-26 DIAGNOSIS — Z95828 Presence of other vascular implants and grafts: Secondary | ICD-10-CM

## 2011-10-26 LAB — COMPREHENSIVE METABOLIC PANEL
ALT: 77 U/L — ABNORMAL HIGH (ref 0–53)
AST: 44 U/L — ABNORMAL HIGH (ref 0–37)
Calcium: 8.6 mg/dL (ref 8.4–10.5)
Creatinine, Ser: 1.3 mg/dL (ref 0.50–1.35)
GFR calc Af Amer: 64 mL/min — ABNORMAL LOW (ref >90)
Glucose, Bld: 150 mg/dL — ABNORMAL HIGH (ref 70–99)
Sodium: 138 mEq/L (ref 135–145)
Total Protein: 5.7 g/dL — ABNORMAL LOW (ref 6.0–8.3)

## 2011-10-26 LAB — CBC
MCH: 36.2 pg — ABNORMAL HIGH (ref 26.0–34.0)
MCHC: 34.1 g/dL (ref 30.0–36.0)
Platelets: 39 10*3/uL — ABNORMAL LOW (ref 150–400)
RBC: 2.43 MIL/uL — ABNORMAL LOW (ref 4.22–5.81)

## 2011-10-26 LAB — DIFFERENTIAL
Basophils Absolute: 0 10*3/uL (ref 0.0–0.1)
Eosinophils Relative: 6 % — ABNORMAL HIGH (ref 0–5)
Lymphocytes Relative: 49 % — ABNORMAL HIGH (ref 12–46)
Lymphs Abs: 0.8 10*3/uL (ref 0.7–4.0)
Neutro Abs: 0.6 10*3/uL — ABNORMAL LOW (ref 1.7–7.7)

## 2011-10-26 LAB — PREPARE RBC (CROSSMATCH)

## 2011-10-26 MED ORDER — SULFAMETHOXAZOLE-TRIMETHOPRIM 800-160 MG PO TABS: ORAL_TABLET | ORAL | Status: DC

## 2011-10-26 MED ORDER — SODIUM CHLORIDE 0.9 % IV SOLN
INTRAVENOUS | Status: DC
  Administered 2011-10-26: 1500 mL via INTRAVENOUS

## 2011-10-26 MED ORDER — HEPARIN SOD (PORK) LOCK FLUSH 100 UNIT/ML IV SOLN
500.0000 [IU] | Freq: Once | INTRAVENOUS | Status: AC
  Administered 2011-10-26: 500 [IU] via INTRAVENOUS

## 2011-10-26 MED ORDER — SULFAMETHOXAZOLE-TMP DS 800-160 MG PO TABS
1.0000 | ORAL_TABLET | Freq: Once | ORAL | Status: AC
  Administered 2011-10-26: 1 via ORAL
  Filled 2011-10-26: qty 1

## 2011-10-26 MED ORDER — HEPARIN SOD (PORK) LOCK FLUSH 100 UNIT/ML IV SOLN
INTRAVENOUS | Status: AC
  Filled 2011-10-26: qty 5

## 2011-10-26 NOTE — Patient Instructions (Addendum)
TYWAUN HILTNER  [id number] Mar 18, 1946 Dr. Glenford Peers   [facility]  Discharge Instructions  RECOMMENDATIONS MADE BY THE CONSULTANT AND ANY TEST RESULTS WILL BE SENT TO YOUR REFERRING DOCTOR.   EXAM FINDINGS BY MD TODAY AND SIGNS AND SYMPTOMS TO REPORT TO CLINIC OR PRIMARY MD: We will give you fluids today and blood tomorrow.  Need to go back to taking a full tablet of allopurinol.  We will check your labs weekly to keep a close watch on your blood counts.  MEDICATIONS PRESCRIBED: Bactrim DS take 1 on Mondays, Wednesdays and Fridays.   INSTRUCTIONS GIVEN AND DISCUSSED: Other :  Report fevers, chills, uncontrolled nausea,vomiting or other problems.  SPECIAL INSTRUCTIONS/FOLLOW-UP: Lab work Needed weekly and Return to Clinic tomorrow for blood transfusion the see schedule for follow-up.   I acknowledge that I have been informed and understand all the instructions given to me and received a copy. I do not have any more questions at this time, but understand that I may call the Specialty Clinic at [facility] at [phone] during business hours should I have any further questions or need assistance in obtaining follow-up care.    __________________________________________  _____________  __________ Signature of Patient or Authorized Representative            Date                   Time    __________________________________________ Nurse's Signature

## 2011-10-26 NOTE — Progress Notes (Signed)
[name] Overman, MD [address] [place] [address]  1. CLL  allopurinol (ZYLOPRIM) 300 MG tablet, 0.9 %  sodium chloride infusion, sulfamethoxazole-trimethoprim (BACTRIM DS,SEPTRA DS) 800-160 MG per tablet, 0.9 %  sodium chloride infusion, sodium chloride 0.9 % injection 10 mL, heparin lock flush 100 unit/mL, heparin lock flush 100 unit/mL, sodium chloride 0.9 % injection 3 mL, Prepare RBC, Transfuse RBC, Type and screen    CURRENT THERAPY: S/P 1 cycle of Bendamustine on 10/13/2011  INTERVAL HISTORY: [name] 66 y.o. male returns for  regular  visit for followup of Recurrent chronic lymphocytic leukemia.   Following his first cycle of chemotherapy, his WBC count went from 236.9, to 3.5 one week later and 1.7 two weeks after treatment (today).  His platelet count dropped from 54 to 39.    [name] reports that for 3-4 day he has been getting lightheaded and "swimmy headed" when standing.  He denies any falls.  He reports that he is drinking 3-4 bottles of 16 oz bottles daily.  Postural vitals were hindered due to unsteadiness on feet.  He thought it was his allopurinol that was making him feel weak, so he started taking a 1/2 tablet daily for the past 2-3 days.    STAT lab work was performed including a CBC and diff and metabolic panel.  The metabolic panel is pending, but with his postural hypotension appreciated on lying from sitting, we will give IV fluids. His hemoglobin is down to 8.8 but this is likely concentrated from his dehydration.  Therefore we will give 2 units of PRBCs (irradiated).  This will be prepared today and administered tomorrow.  We will also start Septra DS on Monday/Wednesday/Friday, one tablet on those days.  This Rx was e-scribed to [facility].  We will administer his first dose here at the clinic today while getting IV hydration.  He was informed to go back to a full 300 mg pill of Allopurinol.  He was also encouraged to increase his PO intake.  We will  perform weekly labs and I will see him in 2 weeks before he get his next cycle of chemotherapy which is scheduled for 11/10/2011 to make sure he is back to baseline and his counts have recovered.  We will also decrease his chemotherapy dose by nearly 67% to 65 mg/m2.   He denies any nausea, vomiting, diarrhea.  He feels well other that his lightheadedness with standing.   Past Medical History  Diagnosis Date  . Aortic stenosis   . Atrial fibrillation   . Coronary atherosclerosis of native coronary artery   . Hyperlipidemia   . Essential hypertension, benign   . BPH (benign prostatic hypertrophy)   . Elevated WBC count   . Arthritis   . Acid reflux   . CLL (chronic lymphoblastic leukemia)   . Hodgkin's disease   . Hypogammaglobulinemia   . Pre-diabetes 08/04/2011    has HODGKIN'S DISEASE; CLL; HYPERLIPIDEMIA; OBESITY; CORONARY ATHEROSCLEROSIS NATIVE CORONARY ARTERY; AORTIC STENOSIS; Atrial fibrillation; FEVER UNSPECIFIED; OTHER DYSPHAGIA; AORTIC VALVE REPLACEMENT, HX OF; Essential hypertension, benign; NECK PAIN, LEFT; GERD (gastroesophageal reflux disease); Thrombocytopenia; Hypogammaglobulinemia, acquired; Anemia; Tachyarrhythmia; Pre-diabetes; UTI (lower urinary tract infection); and Cellulitis of left thigh on his problem list.      has no known allergies.  [name] had no medications administered during this visit.  Past Surgical History  Procedure Date  . Eye surgery   . Aortic valve replacement 10/09    23mm Magna Pericardial   .  Esophagogastroduodenoscopy     scrapping of throat and stretching  . Lithotripsy   . Bone marrow aspiration   . Portacath placement   . Colon surgery   . Coronary artery bypass graft 10/09    SVG to RCA    Denies any headaches, dizziness, double vision, fevers, chills, night sweats, nausea, vomiting, diarrhea, constipation, chest pain, heart palpitations, shortness of breath, blood in stool, black tarry stool, urinary pain, urinary burning,  urinary frequency, hematuria.   PHYSICAL EXAMINATION  ECOG PERFORMANCE STATUS: 2 - Symptomatic, <50% confined to bed  Filed Vitals:   10/26/11 1015  BP: 103/71  Pulse: 82  Temp:     GENERAL:alert, no distress, cooperative and ill looking SKIN: skin color, texture, turgor are normal, no rashes or significant lesions, ill appearing HEAD: Normocephalic, No masses, lesions, tenderness or abnormalities EYES: normal, Conjunctiva are pink and non-injected EARS: External ears normal OROPHARYNX:lips, buccal mucosa, and tongue normal and mucous membranes are moist  NECK: supple, no adenopathy, thyroid normal size, non-tender, without nodularity, no stridor, non-tender, trachea midline LYMPH:  no palpable lymphadenopathy BREAST:not examined LUNGS: clear to auscultation and percussion HEART: regular rate & rhythm, no murmurs, no gallops, S1 normal and S2 normal ABDOMEN:abdomen soft, non-tender and normal bowel sounds BACK: Back symmetric, no curvature. EXTREMITIES:less then 2 second capillary refill, no joint deformities, effusion, or inflammation, no clubbing, no cyanosis  NEURO: alert & oriented x 3 with fluent speech, no focal motor/sensory deficits, gait normal   LABORATORY DATA: CBC    Component Value Date/Time   WBC 1.7* 10/26/2011 0956   RBC 2.43* 10/26/2011 0956   HGB 8.8* 10/26/2011 0956   HCT 25.8* 10/26/2011 0956   PLT 39* 10/26/2011 0956   MCV 106.2* 10/26/2011 0956   MCH 36.2* 10/26/2011 0956   MCHC 34.1 10/26/2011 0956   RDW 17.9* 10/26/2011 0956   LYMPHSABS 226.5* 10/13/2011 0932   MONOABS 5.2* 10/13/2011 0932   EOSABS 0.0 10/13/2011 0932   BASOSABS 0.1 10/13/2011 0932       PENDING LABS: diff, and CMET    ASSESSMENT:  1. Recurrent chronic lymphocytic leukemia.  Presently undergoing Bendamustine chemotherapy.  2. History of Hodgkin disease, mixed cellular type, stage IIIB diagnosed in September 2006, treated with ABVD x6 cycles with bleomycin being dropped after the 1st  several cycles due to decrease in his DLCO and I substituted VP-16 for that. He is in a complete remission from the Hodgkin's.  3. Status post fludarabine, Cytoxan, and rituximab therapy for 3 cycles when he presented with CLL in 2004.  4. Obesity.  5. Insect bites, multiple places on his body.  6. Possible early onset diabetes mellitus with mild elevation in his sugars lately. We will check hemoglobin A1c next time he is here.  7. Pan-hypogammaglobulinemia.  8. Severe deconditioning.  9. History of Agent Orange exposure during the [place] War and he states that just within the last 2 months he has been granted full disability from the [place].  10.Coronary artery disease, status post bypass grafting x1 in October 2009.  11.Peripheral vascular disease.  12.Aortic stenosis with aortic valve replacement.  13.Benign prostatic hypertrophy.  14.Hyperlipidemia.  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  16.Circumcision in the past, leaving him with difficulty with a uniform stream and occasional incontinence.  17.Thrombocytopenia persistent ever since his chemotherapy for his Hodgkin disease, but getting slightly worse.  18. Dehydration with orthostatic hypotension 19. Anemia, thrombocytopenia, leukopenia.   PLAN:  1. Septra DS 1 tablet  PO Monday/Wednesday/Friday, escribed to [place], [place].  2. Allopurinol 300 mg daily.  3. 2 units PRBCs irradiated tomorrow 4. Bactrim tablet PO today while in clinic 5. Dose reduction of Bendamustine to 65 mg/m2.  6. Treatment plan altered to reflect #5. 7. NS 1.5 liters today. 8. Lab work weekly: CBC diff, CMET 9. Patient will return in 8/13 before his next cycle of chemotherapy to verify that he fully recovered from his first cycle.  Encouraged PO fluid intake.  I personally reviewed and went over laboratory results with the patient.   All questions were answered. The patient knows to call the clinic with any problems, questions or concerns. We  can certainly see the patient much sooner if necessary.  The patient and plan discussed with Glenford Peers, MD and he is in agreement with the aforementioned.  KEFALAS,THOMAS

## 2011-10-26 NOTE — Addendum Note (Signed)
Addended by: Edythe Lynn A on: 10/26/2011 05:04 PM   Modules accepted: Orders

## 2011-10-26 NOTE — Progress Notes (Signed)
Labs drawn today for cbc 

## 2011-10-26 NOTE — Progress Notes (Signed)
Tolerated well

## 2011-10-26 NOTE — Addendum Note (Signed)
Addended byLeida Lauth on: 10/26/2011 10:55 AM   Modules accepted: Orders

## 2011-10-27 ENCOUNTER — Other Ambulatory Visit (HOSPITAL_COMMUNITY): Payer: Medicare Other

## 2011-10-27 ENCOUNTER — Encounter (HOSPITAL_BASED_OUTPATIENT_CLINIC_OR_DEPARTMENT_OTHER): Payer: Medicare Other

## 2011-10-27 DIAGNOSIS — D649 Anemia, unspecified: Secondary | ICD-10-CM

## 2011-10-27 MED ORDER — HEPARIN SOD (PORK) LOCK FLUSH 100 UNIT/ML IV SOLN
500.0000 [IU] | Freq: Every day | INTRAVENOUS | Status: AC | PRN
  Administered 2011-10-27: 500 [IU]
  Filled 2011-10-27: qty 5

## 2011-10-27 MED ORDER — SODIUM CHLORIDE 0.9 % IJ SOLN
10.0000 mL | INTRAMUSCULAR | Status: AC | PRN
  Administered 2011-10-27: 10 mL
  Filled 2011-10-27: qty 10

## 2011-10-27 MED ORDER — SODIUM CHLORIDE 0.9 % IV SOLN
250.0000 mL | Freq: Once | INTRAVENOUS | Status: AC
  Administered 2011-10-27: 250 mL via INTRAVENOUS

## 2011-10-27 MED ORDER — HEPARIN SOD (PORK) LOCK FLUSH 100 UNIT/ML IV SOLN
INTRAVENOUS | Status: AC
  Filled 2011-10-27: qty 5

## 2011-10-27 NOTE — Progress Notes (Signed)
Tolerated transfusion well. 

## 2011-10-28 ENCOUNTER — Other Ambulatory Visit (HOSPITAL_COMMUNITY): Payer: Medicare Other

## 2011-10-28 LAB — TYPE AND SCREEN: Unit division: 0

## 2011-11-03 ENCOUNTER — Other Ambulatory Visit (HOSPITAL_COMMUNITY): Payer: Self-pay | Admitting: Oncology

## 2011-11-03 ENCOUNTER — Encounter (HOSPITAL_COMMUNITY): Payer: Medicare Other | Attending: Oncology

## 2011-11-03 DIAGNOSIS — C8190 Hodgkin lymphoma, unspecified, unspecified site: Secondary | ICD-10-CM | POA: Insufficient documentation

## 2011-11-03 DIAGNOSIS — C9110 Chronic lymphocytic leukemia of B-cell type not having achieved remission: Secondary | ICD-10-CM | POA: Insufficient documentation

## 2011-11-03 LAB — COMPREHENSIVE METABOLIC PANEL
ALT: 57 U/L — ABNORMAL HIGH (ref 0–53)
AST: 47 U/L — ABNORMAL HIGH (ref 0–37)
Albumin: 3.3 g/dL — ABNORMAL LOW (ref 3.5–5.2)
Alkaline Phosphatase: 105 U/L (ref 39–117)
Chloride: 106 mEq/L (ref 96–112)
Potassium: 3.4 mEq/L — ABNORMAL LOW (ref 3.5–5.1)
Sodium: 140 mEq/L (ref 135–145)
Total Bilirubin: 0.8 mg/dL (ref 0.3–1.2)
Total Protein: 5.9 g/dL — ABNORMAL LOW (ref 6.0–8.3)

## 2011-11-03 LAB — DIFFERENTIAL
Basophils Relative: 2 % — ABNORMAL HIGH (ref 0–1)
Eosinophils Absolute: 0.3 10*3/uL (ref 0.0–0.7)
Lymphs Abs: 0.8 10*3/uL (ref 0.7–4.0)
Monocytes Absolute: 0.2 10*3/uL (ref 0.1–1.0)
Monocytes Relative: 12 % (ref 3–12)

## 2011-11-03 LAB — CBC
HCT: 29.4 % — ABNORMAL LOW (ref 39.0–52.0)
Platelets: 37 10*3/uL — ABNORMAL LOW (ref 150–400)
RDW: 20.7 % — ABNORMAL HIGH (ref 11.5–15.5)
WBC: 1.6 10*3/uL — ABNORMAL LOW (ref 4.0–10.5)

## 2011-11-03 NOTE — Progress Notes (Signed)
Pt notified of neutropenic precautions and to take bactrim daily. Verbalized understanding

## 2011-11-03 NOTE — Progress Notes (Signed)
Labs drawn today for cbc/diff,cmp 

## 2011-11-08 ENCOUNTER — Other Ambulatory Visit (HOSPITAL_COMMUNITY): Payer: Medicare Other

## 2011-11-09 ENCOUNTER — Other Ambulatory Visit (HOSPITAL_COMMUNITY): Payer: Self-pay | Admitting: Oncology

## 2011-11-09 ENCOUNTER — Encounter (HOSPITAL_BASED_OUTPATIENT_CLINIC_OR_DEPARTMENT_OTHER): Payer: Medicare Other | Admitting: Oncology

## 2011-11-09 VITALS — BP 106/62 | HR 78 | Temp 96.8°F | Resp 18 | Wt 229.8 lb

## 2011-11-09 DIAGNOSIS — D801 Nonfamilial hypogammaglobulinemia: Secondary | ICD-10-CM

## 2011-11-09 DIAGNOSIS — C8129 Mixed cellularity classical Hodgkin lymphoma, extranodal and solid organ sites: Secondary | ICD-10-CM

## 2011-11-09 DIAGNOSIS — C9110 Chronic lymphocytic leukemia of B-cell type not having achieved remission: Secondary | ICD-10-CM

## 2011-11-09 DIAGNOSIS — D649 Anemia, unspecified: Secondary | ICD-10-CM

## 2011-11-09 LAB — CBC
MCH: 35.8 pg — ABNORMAL HIGH (ref 26.0–34.0)
MCHC: 34.5 g/dL (ref 30.0–36.0)
MCV: 103.9 fL — ABNORMAL HIGH (ref 78.0–100.0)
Platelets: 78 10*3/uL — ABNORMAL LOW (ref 150–400)

## 2011-11-09 LAB — COMPREHENSIVE METABOLIC PANEL
ALT: 62 U/L — ABNORMAL HIGH (ref 0–53)
AST: 48 U/L — ABNORMAL HIGH (ref 0–37)
CO2: 24 mEq/L (ref 19–32)
Calcium: 9.2 mg/dL (ref 8.4–10.5)
Creatinine, Ser: 1.46 mg/dL — ABNORMAL HIGH (ref 0.50–1.35)
GFR calc Af Amer: 56 mL/min — ABNORMAL LOW (ref >90)
GFR calc non Af Amer: 48 mL/min — ABNORMAL LOW (ref >90)
Glucose, Bld: 132 mg/dL — ABNORMAL HIGH (ref 70–99)
Sodium: 135 mEq/L (ref 135–145)
Total Protein: 6.3 g/dL (ref 6.0–8.3)

## 2011-11-09 LAB — DIFFERENTIAL
Basophils Absolute: 0 10*3/uL (ref 0.0–0.1)
Eosinophils Relative: 5 % (ref 0–5)
Lymphocytes Relative: 43 % (ref 12–46)
Lymphs Abs: 1 10*3/uL (ref 0.7–4.0)
Neutro Abs: 1 10*3/uL — ABNORMAL LOW (ref 1.7–7.7)

## 2011-11-09 NOTE — Progress Notes (Signed)
Earl Gala presented for labwork. Labs per MD order drawn via Peripheral Line 23 gauge needle inserted in left hand.  Good blood return present. Procedure without incident.  Needle removed intact. Patient tolerated procedure well.

## 2011-11-09 NOTE — Patient Instructions (Addendum)
[name]  [id number] December 31, 1945 Dr. Glenford Peers   [facility]  Discharge Instructions  RECOMMENDATIONS MADE BY THE CONSULTANT AND ANY TEST RESULTS WILL BE SENT TO YOUR REFERRING DOCTOR.   EXAM FINDINGS BY MD TODAY AND SIGNS AND SYMPTOMS TO REPORT TO CLINIC OR PRIMARY MD: Exam and discussion by PA.  We will check your pre-chemotherapy blood work today.  If your blood counts are not adequate for treatment tomorrow, we will call your.  MEDICATIONS PRESCRIBED: none   INSTRUCTIONS GIVEN AND DISCUSSED: Other :  Report uncontrolled nausea, vomiting, pain, increased shortness of breath or increased fatigue, fevers, chills, etc.  SPECIAL INSTRUCTIONS/FOLLOW-UP: Lab work Needed today and Return to Clinic in 4 weeks for follow-up.   I acknowledge that I have been informed and understand all the instructions given to me and received a copy. I do not have any more questions at this time, but understand that I may call the Specialty Clinic at [facility] at [phone] during business hours should I have any further questions or need assistance in obtaining follow-up care.    __________________________________________  _____________  __________ Signature of Patient or Authorized Representative            Date                   Time    __________________________________________ Nurse's Signature

## 2011-11-09 NOTE — Progress Notes (Signed)
[name] Overman, MD [address] [place] [address]  1. CLL     CURRENT THERAPY:S/P 1 cycle of Bendamustine on 10/13/2011   INTERVAL HISTORY: [name] 66 y.o. male returns for  regular  visit for followup of Recurrent chronic lymphocytic leukemia.    Tri is doing much better following his PRBC transfusion the other day.  He is not nearly as pale.  He is accompanied by his wife today and she validates that he looks and feels better.  He mentions minimal right trapezius muscle discomfort occasionally and I recommended OTC Tylenol or Advil.  She explains that the pain is not bad at all.   We spent some time discussing his chemotherapeutic regimen.  He will received a reduced dose on subsequent cycles.  His lab work last week does not meet the parameters for  Chemotherapy tomorrow, so labs will be performed today in preparation for chemotherapy tomorrow if treatment parameters are met.   Otherwise, hematologically, [name] is doing well.  ROS questioning is negative including B symptoms.   Past Medical History  Diagnosis Date  . Aortic stenosis   . Atrial fibrillation   . Coronary atherosclerosis of native coronary artery   . Hyperlipidemia   . Essential hypertension, benign   . BPH (benign prostatic hypertrophy)   . Elevated WBC count   . Arthritis   . Acid reflux   . CLL (chronic lymphoblastic leukemia)   . Hodgkin's disease   . Hypogammaglobulinemia   . Pre-diabetes 08/04/2011    has HODGKIN'S DISEASE; CLL; HYPERLIPIDEMIA; OBESITY; CORONARY ATHEROSCLEROSIS NATIVE CORONARY ARTERY; AORTIC STENOSIS; Atrial fibrillation; FEVER UNSPECIFIED; OTHER DYSPHAGIA; AORTIC VALVE REPLACEMENT, HX OF; Essential hypertension, benign; NECK PAIN, LEFT; GERD (gastroesophageal reflux disease); Thrombocytopenia; Hypogammaglobulinemia, acquired; Anemia; Tachyarrhythmia; Pre-diabetes; UTI (lower urinary tract infection); and Cellulitis of left thigh on his problem list.      has  no known allergies.  [name] does not currently have medications on file.  Past Surgical History  Procedure Date  . Eye surgery   . Aortic valve replacement 10/09    23mm Magna Pericardial   . Esophagogastroduodenoscopy     scrapping of throat and stretching  . Lithotripsy   . Bone marrow aspiration   . Portacath placement   . Colon surgery   . Coronary artery bypass graft 10/09    SVG to RCA    Denies any headaches, dizziness, double vision, fevers, chills, night sweats, nausea, vomiting, diarrhea, constipation, chest pain, heart palpitations, shortness of breath, blood in stool, black tarry stool, urinary pain, urinary burning, urinary frequency, hematuria.   PHYSICAL EXAMINATION  ECOG PERFORMANCE STATUS: 2 - Symptomatic, <50% confined to bed  Filed Vitals:   11/09/11 1059  BP: 106/62  Pulse: 78  Temp: 96.8 F (36 C)  Resp: 18    GENERAL:alert, no distress, well nourished, well developed, comfortable, cooperative, obese and smiling SKIN: skin color, texture, turgor are normal, no rashes or significant lesions HEAD: Normocephalic, No masses, lesions, tenderness or abnormalities EYES: normal, Conjunctiva are pink and non-injected EARS: External ears normal OROPHARYNX:lips, buccal mucosa, and tongue normal and mucous membranes are moist  NECK: supple, no adenopathy, thyroid normal size, non-tender, without nodularity, no stridor, non-tender, trachea midline LYMPH:  no palpable lymphadenopathy, not examined BREAST:not examined LUNGS: clear to auscultation and percussion HEART: regular rate & rhythm, no murmurs, no gallops, S1 normal and S2 normal ABDOMEN:abdomen soft, non-tender, obese and normal bowel sounds BACK: Back symmetric, no curvature. EXTREMITIES:less then  2 second capillary refill, no joint deformities, effusion, or inflammation, no edema, no skin discoloration, no clubbing, no cyanosis  NEURO: alert & oriented x 3 with fluent speech, no focal motor/sensory  deficits, gait normal   LABORATORY DATA: CBC    Component Value Date/Time   WBC 1.6* 11/03/2011 0922   RBC 2.81* 11/03/2011 0922   HGB 9.9* 11/03/2011 0922   HCT 29.4* 11/03/2011 0922   PLT 37* 11/03/2011 0922   MCV 104.6* 11/03/2011 0922   MCH 35.2* 11/03/2011 0922   MCHC 33.7 11/03/2011 0922   RDW 20.7* 11/03/2011 0922   LYMPHSABS 0.8 11/03/2011 0922   MONOABS 0.2 11/03/2011 0922   EOSABS 0.3 11/03/2011 0922   BASOSABS 0.0 11/03/2011 0922      Chemistry      Component Value Date/Time   NA 140 11/03/2011 0922   K 3.4* 11/03/2011 0922   CL 106 11/03/2011 0922   CO2 26 11/03/2011 0922   BUN 11 11/03/2011 0922   CREATININE 1.12 11/03/2011 0922   CREATININE 1.29 05/10/2011 1541      Component Value Date/Time   CALCIUM 9.0 11/03/2011 0922   ALKPHOS 105 11/03/2011 0922   AST 47* 11/03/2011 0922   ALT 57* 11/03/2011 0922   BILITOT 0.8 11/03/2011 0922        PENDING LABS: CBC with Diff and CMET    ASSESSMENT:  1. Recurrent chronic lymphocytic leukemia. Presently undergoing Bendamustine chemotherapy. S/P 1 cycle. 2. 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 He is in a complete remission from the Hodgkin's.  3. Status post fludarabine, Cytoxan, and rituximab therapy for 3 cycles when he presented with CLL in 2004.  4. Obesity.  5. Insect bites, multiple places on his body.  6. Possible early onset diabetes mellitus with mild elevation in his sugars lately. We will check hemoglobin A1c next time he is here.  7. Pan-hypogammaglobulinemia.  8. Severe deconditioning.  9. History of Agent Orange exposure during the [place] War and he states that just within the last 2 months he has been granted full disability from the [place].  10.Coronary artery disease, status post bypass grafting x1 in October 2009.  11.Peripheral vascular  disease.  12.Aortic stenosis with aortic valve replacement.  13.Benign prostatic hypertrophy.  14.Hyperlipidemia.  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  16.Circumcision in the past, leaving him with difficulty with a uniform stream and occasional incontinence.  17.Thrombocytopenia persistent ever since his chemotherapy for his Hodgkin disease, but getting slightly worse.  18. Anemia, thrombocytopenia, leukopenia.   PLAN:  1. I personally reviewed and went over laboratory results with the patient. 2. Discussion regarding future chemotherapy being at a reduced dose.  3. Lab work today: CBC diff, CMET 4. Chemotherapy tomorrow as scheduled if treatment parameters met.  5. Return in 4 weeks for follow-up.    All questions were answered. The patient knows to call the clinic with any problems, questions or concerns. We can certainly see the patient much sooner if necessary.  Audriana Aldama

## 2011-11-10 ENCOUNTER — Other Ambulatory Visit: Payer: Self-pay

## 2011-11-10 ENCOUNTER — Emergency Department (HOSPITAL_COMMUNITY)
Admission: EM | Admit: 2011-11-10 | Discharge: 2011-11-11 | Disposition: A | Discharge status: Home / Self Care | Payer: Medicare Other | Attending: Emergency Medicine | Admitting: Emergency Medicine

## 2011-11-10 ENCOUNTER — Encounter (HOSPITAL_COMMUNITY): Payer: Self-pay | Admitting: *Deleted

## 2011-11-10 ENCOUNTER — Emergency Department (HOSPITAL_COMMUNITY): Payer: Medicare Other

## 2011-11-10 ENCOUNTER — Inpatient Hospital Stay (HOSPITAL_COMMUNITY): Payer: Medicare Other

## 2011-11-10 ENCOUNTER — Other Ambulatory Visit (HOSPITAL_COMMUNITY): Payer: Medicare Other

## 2011-11-10 DIAGNOSIS — I4891 Unspecified atrial fibrillation: Secondary | ICD-10-CM | POA: Insufficient documentation

## 2011-11-10 DIAGNOSIS — I2510 Atherosclerotic heart disease of native coronary artery without angina pectoris: Secondary | ICD-10-CM | POA: Insufficient documentation

## 2011-11-10 DIAGNOSIS — Z951 Presence of aortocoronary bypass graft: Secondary | ICD-10-CM | POA: Insufficient documentation

## 2011-11-10 DIAGNOSIS — C8190 Hodgkin lymphoma, unspecified, unspecified site: Secondary | ICD-10-CM | POA: Insufficient documentation

## 2011-11-10 DIAGNOSIS — D649 Anemia, unspecified: Secondary | ICD-10-CM | POA: Insufficient documentation

## 2011-11-10 DIAGNOSIS — M542 Cervicalgia: Secondary | ICD-10-CM | POA: Insufficient documentation

## 2011-11-10 DIAGNOSIS — E785 Hyperlipidemia, unspecified: Secondary | ICD-10-CM | POA: Insufficient documentation

## 2011-11-10 DIAGNOSIS — Z79899 Other long term (current) drug therapy: Secondary | ICD-10-CM | POA: Insufficient documentation

## 2011-11-10 DIAGNOSIS — N289 Disorder of kidney and ureter, unspecified: Secondary | ICD-10-CM | POA: Insufficient documentation

## 2011-11-10 DIAGNOSIS — I10 Essential (primary) hypertension: Secondary | ICD-10-CM | POA: Insufficient documentation

## 2011-11-10 DIAGNOSIS — R51 Headache: Secondary | ICD-10-CM | POA: Insufficient documentation

## 2011-11-10 DIAGNOSIS — N400 Enlarged prostate without lower urinary tract symptoms: Secondary | ICD-10-CM | POA: Insufficient documentation

## 2011-11-10 DIAGNOSIS — M79609 Pain in unspecified limb: Secondary | ICD-10-CM | POA: Insufficient documentation

## 2011-11-10 DIAGNOSIS — Z9221 Personal history of antineoplastic chemotherapy: Secondary | ICD-10-CM | POA: Insufficient documentation

## 2011-11-10 DIAGNOSIS — R209 Unspecified disturbances of skin sensation: Secondary | ICD-10-CM | POA: Insufficient documentation

## 2011-11-10 DIAGNOSIS — C9110 Chronic lymphocytic leukemia of B-cell type not having achieved remission: Secondary | ICD-10-CM | POA: Insufficient documentation

## 2011-11-10 DIAGNOSIS — E860 Dehydration: Secondary | ICD-10-CM | POA: Insufficient documentation

## 2011-11-10 DIAGNOSIS — Z87442 Personal history of urinary calculi: Secondary | ICD-10-CM | POA: Insufficient documentation

## 2011-11-10 DIAGNOSIS — R200 Anesthesia of skin: Secondary | ICD-10-CM

## 2011-11-10 HISTORY — DX: Calculus of kidney: N20.0

## 2011-11-10 LAB — CBC WITH DIFFERENTIAL/PLATELET
Basophils Absolute: 0 10*3/uL (ref 0.0–0.1)
Eosinophils Absolute: 0.2 10*3/uL (ref 0.0–0.7)
Lymphs Abs: 0.8 10*3/uL (ref 0.7–4.0)
MCHC: 34 g/dL (ref 30.0–36.0)
MCV: 104.7 fL — ABNORMAL HIGH (ref 78.0–100.0)
Monocytes Relative: 18 % — ABNORMAL HIGH (ref 3–12)
Neutro Abs: 1 10*3/uL — ABNORMAL LOW (ref 1.7–7.7)
Platelets: 92 10*3/uL — ABNORMAL LOW (ref 150–400)
RDW: 21 % — ABNORMAL HIGH (ref 11.5–15.5)
WBC: 2.4 10*3/uL — ABNORMAL LOW (ref 4.0–10.5)

## 2011-11-10 LAB — COMPREHENSIVE METABOLIC PANEL
ALT: 56 U/L — ABNORMAL HIGH (ref 0–53)
AST: 41 U/L — ABNORMAL HIGH (ref 0–37)
BUN: 23 mg/dL (ref 6–23)
GFR calc non Af Amer: 38 mL/min — ABNORMAL LOW (ref >90)
Glucose, Bld: 104 mg/dL — ABNORMAL HIGH (ref 70–99)
Potassium: 3.8 mEq/L (ref 3.5–5.1)
Total Bilirubin: 0.6 mg/dL (ref 0.3–1.2)

## 2011-11-10 NOTE — ED Notes (Addendum)
Pt was lying on bed, talking on phone, then had cramping sensation lt hand , and vague discomfort in rt arm. Lasting 1 minute..  Supposed to have chemo today , but was cancelled due to low wbc count. Being  tx for leukemia

## 2011-11-10 NOTE — ED Notes (Signed)
CRITICAL VALUE ALERT  Critical value received:  Wbc 2.4  Date of notification:  11/10/11  Time of notification:  2334  Critical value read back:yes  Nurse who received alert:  P.Corene Resnick,rn  MD notified (1st page):  knapp  Time of first page:  2334  MD notified (2nd page):  Time of second page:  Responding MD:  knapp  Time MD responded: 2334

## 2011-11-10 NOTE — ED Provider Notes (Cosign Needed)
History    This chart was scribed for Ward Givens, MD, MD by Smitty Pluck. The patient was seen in room APA03 and the patient's care was started at 10:00PM.   CSN: [id number]  Arrival date & time 11/10/11  2040   First MD Initiated Contact with Patient 11/10/11 2156      Chief Complaint  Patient presents with  . Hand Pain    (Consider location/radiation/quality/duration/timing/severity/associated sxs/prior treatment) The history is provided by the patient and the spouse.   [name] is a 66 y.o. male who presents to the Emergency Department complaining of moderate, cramping sensation in left thumb with numbness in left hand and states he was drawn up under his LIF and he couldn't move it  With  right hand numbness and points to the dorsum of his right hand onset today while laying in bed talking on the phone.. Reports the episode lasted 1- 2  minute. Pt reports that he was ambulatory. Wife denies slurred speech. Pr reports having mild cough,mild  sore throat, rhinorrhea, sneezing, diarrhea (2x today), mild headache and stiffness in neck. Pt started chemotherapy last month. Pt was suppose to have chemotherapy today and tomorrow but he did not have it because his WBC was low. Pt denies fever,dysuria, frequency nausea and vomiting. He has had a mild HA but not right now. Pt denies smoking and drinking alcohol.   Pt is getting Chemotherapy for CLL (Bendamustine)  Oncologist is Dr. [name]   PCP is Dr. Lodema Hong  Past Medical History  Diagnosis Date  . Aortic stenosis   . Atrial fibrillation   . Coronary atherosclerosis of native coronary artery   . Hyperlipidemia   . Essential hypertension, benign   . BPH (benign prostatic hypertrophy)   . Elevated WBC count   . Arthritis   . Acid reflux   . Hypogammaglobulinemia   . Pre-diabetes 08/04/2011  . CLL (chronic lymphoblastic leukemia)   . Hodgkin's disease   . Kidney stones     Past Surgical History  Procedure Date  . Eye  surgery   . Aortic valve replacement 10/09    23mm Magna Pericardial   . Esophagogastroduodenoscopy     scrapping of throat and stretching  . Lithotripsy   . Bone marrow aspiration   . Portacath placement   . 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 Colon surgery     Family History  Problem Relation Age of Onset  . Heart failure Mother   . Blindness Mother   . Cancer Father     Lung     History  Substance Use Topics  . Smoking status: Never Smoker   . Smokeless tobacco: Never Used   Comment: pt denies tobacco use   . Alcohol Use: No     pt denies alcohol    Lives with spouse Lives at home  Review of Systems  Constitutional: Negative for fever.  HENT: Positive for sore throat and rhinorrhea.   Respiratory: Positive for cough.   Gastrointestinal: Positive for diarrhea.  Neurological: Positive for numbness and headaches.  All other systems reviewed and are negative.    Allergies  Review of patient's allergies indicates no known allergies.  Home Medications   Current Outpatient Rx  Name Route Sig Dispense Refill  . ACETAMINOPHEN 325 MG PO TABS Oral Take 650 mg by mouth once. Take 2 tablets (=650mg ) 1 hour prior to Rituxan.    Marland Kitchen ACYCLOVIR 200 MG PO CAPS  Oral Take 200 mg by mouth 2 (two) times daily. Take one tablet by mouth twice a day     . ALLOPURINOL 300 MG PO TABS Oral Take 300 mg by mouth daily. 10/10/11 began taking 1/2 tablet.    . ASPIRIN 81 MG PO TBEC Oral Take 81 mg by mouth daily. Take one tablet by mouth daily     . DEXAMETHASONE 4 MG PO TABS  Starting the day before chemo, take 2 tablets in the am and 2 tablets in the pm. The morning of Rituxan take 2 tablets in the am. Then starting the day after chemo, take 2 tablets in the am x 2 days. Take with food.    Marland Kitchen DIPHENHYDRAMINE HCL 25 MG PO TABS Oral Take 50 mg by mouth. Take 2 tablets (=50mg ) 1 hour prior to Rituxan.    Marland Kitchen FOLIC ACID 1 MG PO TABS Oral Take 1 mg by mouth daily.    Marland Kitchen METOPROLOL  TARTRATE 50 MG PO TABS Oral Take 50 mg by mouth 2 (two) times daily.    . CENTRUM SILVER ULTRA MENS PO Oral Take 1 tablet by mouth every morning.     Marland Kitchen OMEGA 3 1000 MG PO CAPS Oral Take 1,000 mg by mouth every morning.     Marland Kitchen OMEPRAZOLE 20 MG PO CPDR Oral Take 20 mg by mouth every morning.     Marland Kitchen ONDANSETRON HCL 8 MG PO TABS  Take 1 tablet two times a day starting the day after chemo for 2 days. Then take 1 tablet two times a day as needed for nausea or vomiting. 30 tablet 1  . RITUXAN IV Intravenous Inject into the vein every 28 (twenty-eight) days.    Marland Kitchen ROSUVASTATIN CALCIUM 40 MG PO TABS Oral Take 40 mg by mouth daily.    . BENDAMUSTINE CHEMO IV INFUSION Intravenous Inject into the vein once. Every 28 days    . SULFAMETHOXAZOLE-TRIMETHOPRIM 800-160 MG PO TABS Oral Take 1 tablet by mouth daily.    . ACETAMINOPHEN 500 MG PO TABS Oral Take 1,000 mg by mouth once as needed. For pain     . LORAZEPAM 1 MG PO TABS Oral Take 1 tablet (1 mg total) by mouth every 4 (four) hours as needed (Nausea or vomiting). 30 tablet 1  . PROCHLORPERAZINE MALEATE 10 MG PO TABS Oral Take 1 tablet (10 mg total) by mouth every 6 (six) hours as needed (Nausea or vomiting). 30 tablet 1    BP 151/78  Pulse 89  Temp 97.5 F (36.4 C) (Oral)  Resp 20  Ht 5\' 10"  (1.778 m)  Wt 229 lb (103.874 kg)  BMI 32.86 kg/m2  SpO2 100%  Vital signs normal    Physical Exam  Nursing note and vitals reviewed. Constitutional: He is oriented to person, place, and time. He appears well-developed and well-nourished.  Non-toxic appearance. He does not appear ill. No distress.  HENT:  Head: Normocephalic and atraumatic.  Right Ear: External ear normal.  Left Ear: External ear normal.  Nose: Nose normal. No mucosal edema or rhinorrhea.  Mouth/Throat: Oropharynx is clear and moist and mucous membranes are normal. No dental abscesses or uvula swelling.  Eyes: Conjunctivae and EOM are normal. Pupils are equal, round, and reactive to light.    Neck: Normal range of motion and full passive range of motion without pain. Neck supple.       Tender in his cervical spine and left paraspinous muscles  Cardiovascular: Normal rate and regular rhythm.  Exam reveals no gallop and no friction rub.   Murmur heard.  Crescendo systolic murmur is present  Pulmonary/Chest: Effort normal and breath sounds normal. No respiratory distress. He has no wheezes. He has no rhonchi. He has no rales. He exhibits no tenderness and no crepitus.  Abdominal: Soft. Normal appearance and bowel sounds are normal. He exhibits no distension. There is no tenderness. There is no rebound and no guarding.  Musculoskeletal: Normal range of motion. He exhibits no edema and no tenderness.       Moves all extremities well.   Neurological: He is alert and oriented to person, place, and time. He has normal strength. No cranial nerve deficit. Coordination normal.       Grip strength equal No pronator drift  No focal motor weakness in extremities  Skin: Skin is warm, dry and intact. No rash noted. No erythema. No pallor.  Psychiatric: He has a normal mood and affect. His speech is normal and behavior is normal. His mood appears not anxious.    ED Course  Procedures (including critical care time)  Medications  sodium chloride 0.9 % bolus 1,000 mL (not administered)   Pt given 1 liter of fluid for dehydration and some worsening of his baseline renal insuffic.  DIAGNOSTIC STUDIES: Oxygen Saturation is 100% on room air, normal by my interpretation.    COORDINATION OF CARE: 10:11PM EDP discusses pt ED treatment with pt    Results for orders placed during the hospital encounter of 11/10/11  CBC WITH DIFFERENTIAL      Component Value Range   WBC 2.4 (*) 4.0 - 10.5 K/uL   RBC 2.56 (*) 4.22 - 5.81 MIL/uL   Hemoglobin 9.1 (*) 13.0 - 17.0 g/dL   HCT 16.1 (*) 09.6 - 04.5 %   MCV 104.7 (*) 78.0 - 100.0 fL   MCH 35.5 (*) 26.0 - 34.0 pg   MCHC 34.0  30.0 - 36.0 g/dL   RDW  40.9 (*) 81.1 - 15.5 %   Platelets 92 (*) 150 - 400 K/uL   Neutrophils Relative 39 (*) 43 - 77 %   Lymphocytes Relative 34  12 - 46 %   Monocytes Relative 18 (*) 3 - 12 %   Eosinophils Relative 9 (*) 0 - 5 %   Basophils Relative 0  0 - 1 %   Neutro Abs 1.0 (*) 1.7 - 7.7 K/uL   Lymphs Abs 0.8  0.7 - 4.0 K/uL   Monocytes Absolute 0.4  0.1 - 1.0 K/uL   Eosinophils Absolute 0.2  0.0 - 0.7 K/uL   Basophils Absolute 0.0  0.0 - 0.1 K/uL   WBC Morphology WHITE COUNT CONFIRMED ON SMEAR     Smear Review LARGE PLATELETS PRESENT    COMPREHENSIVE METABOLIC PANEL      Component Value Range   Sodium 138  135 - 145 mEq/L   Potassium 3.8  3.5 - 5.1 mEq/L   Chloride 104  96 - 112 mEq/L   CO2 24  19 - 32 mEq/L   Glucose, Bld 104 (*) 70 - 99 mg/dL   BUN 23  6 - 23 mg/dL   Creatinine, Ser 9.14 (*) 0.50 - 1.35 mg/dL   Calcium 9.1  8.4 - 78.2 mg/dL   Total Protein 6.1  6.0 - 8.3 g/dL   Albumin 3.7  3.5 - 5.2 g/dL   AST 41 (*) 0 - 37 U/L   ALT 56 (*) 0 - 53 U/L   Alkaline Phosphatase 106  39 - 117 U/L   Total Bilirubin 0.6  0.3 - 1.2 mg/dL   GFR calc non Af Amer 38 (*) >90 mL/min   GFR calc Af Amer 44 (*) >90 mL/min  TROPONIN I      Component Value Range   Troponin I <0.30  <0.30 ng/mL   Laboratory interpretation all normal except low WBC without neutropenia, anemia (stable), renal insuffic (slightly higher)  UA pending   Dg Chest 2 View  11/11/2011  *RADIOLOGY REPORT*  Clinical Data: Headache, weakness, leukemia  CHEST - 2 VIEW  Comparison: 10/06/2011  Findings: Left subclavian catheter extends to the low SVC. Previous CABG and AVR.  Lungs are clear.  No effusion.  Mild spurring in the mid thoracic spine.  IMPRESSION:  1.  Stable postoperative changes.  No acute disease.  Original Report Authenticated By: Osa Craver, M.D.   Ct Head Wo Contrast Ct Cervical Spine Wo Contrast  11/10/2011  *RADIOLOGY REPORT*  Clinical Data:  headache. Neck stiffness.  History of leukemia.  CT HEAD  WITHOUT CONTRAST CT CERVICAL SPINE WITHOUT CONTRAST  Technique:  Multidetector CT imaging of the head and cervical spine was performed following the standard protocol without intravenous contrast.  Multiplanar CT image reconstructions of the cervical spine were also generated.  Comparison:   None  CT HEAD  Findings: No mass lesion, mass effect, midline shift, hydrocephalus, hemorrhage.  No territorial ischemia or acute infarction.  Intracranial atherosclerosis is present.  Paranasal sinuses and mastoid air cells clear.  No skull fracture.  IMPRESSION: No acute intracranial abnormality.  CT CERVICAL SPINE  Findings: Carotid atherosclerosis.  No cervical spine fracture or dislocation is present.  The partially visualized pacemaker and median sternotomy changes.  Heterotopic/dystrophic calcification adjacent to the lower cervical spinous processes.  C6-C7 predominant cervical spondylosis.  Atlantodental degenerative disease is present.  Prevertebral soft tissues are normal. Calcification of the nuchal ligament.  Visualized lung apices appear within normal limits.  Multilevel cervical spondylosis is present with bilateral uncovertebral spurring.  IMPRESSION: No acute osseous abnormality.  Mild to moderate cervical spondylosis.  Original Report Authenticated By: Andreas Newport, M.D.      Date: 11/10/2011  Rate: 83  Rhythm: normal sinus rhythm  QRS Axis: normal  Intervals: normal  ST/T Wave abnormalities: nonspecific T wave changes  Conduction Disutrbances:nonspecific intraventricular conduction delay  Narrative Interpretation:   Old EKG Reviewed: unchanged from 08/03/2011    1. Numbness in both hands   2. Dehydration   3. Anemia   4. Renal insufficiency     Plan discharge  Devoria Albe, MD, FACEP   MDM    I personally performed the services described in this documentation, which was scribed in my presence. The recorded information has been reviewed and considered.  Devoria Albe, MD,  FACEP    Ward Givens, MD 11/11/11 [phone]

## 2011-11-11 ENCOUNTER — Inpatient Hospital Stay (HOSPITAL_COMMUNITY): Payer: Medicare Other

## 2011-11-11 LAB — URINALYSIS, ROUTINE W REFLEX MICROSCOPIC
Bilirubin Urine: NEGATIVE
Hgb urine dipstick: NEGATIVE
Protein, ur: NEGATIVE mg/dL
Urobilinogen, UA: 0.2 mg/dL (ref 0.0–1.0)

## 2011-11-11 MED ORDER — SODIUM CHLORIDE 0.9 % IV BOLUS (SEPSIS)
1000.0000 mL | Freq: Once | INTRAVENOUS | Status: AC
  Administered 2011-11-11: 1000 mL via INTRAVENOUS

## 2011-11-11 NOTE — ED Provider Notes (Signed)
0030 Assumed care/disposition of patient. He is receiving IVF. Await UA. 0150 UA negative for infection. Concentrated however was obtained before IVF. Patient is feeling better. Discharge home.  Nicoletta Dress. Colon Branch, MD 11/11/11 1610

## 2011-11-15 ENCOUNTER — Other Ambulatory Visit (HOSPITAL_COMMUNITY): Payer: Self-pay | Admitting: Oncology

## 2011-11-16 ENCOUNTER — Inpatient Hospital Stay (HOSPITAL_COMMUNITY): Payer: Medicare Other

## 2011-11-17 ENCOUNTER — Encounter (HOSPITAL_COMMUNITY): Payer: Medicare Other

## 2011-11-17 ENCOUNTER — Encounter (HOSPITAL_BASED_OUTPATIENT_CLINIC_OR_DEPARTMENT_OTHER): Payer: Medicare Other

## 2011-11-17 ENCOUNTER — Other Ambulatory Visit (HOSPITAL_COMMUNITY): Payer: Medicare Other

## 2011-11-17 ENCOUNTER — Other Ambulatory Visit (HOSPITAL_COMMUNITY): Payer: Self-pay | Admitting: Oncology

## 2011-11-17 VITALS — BP 115/65 | HR 83 | Temp 97.8°F | Resp 20 | Wt 233.2 lb

## 2011-11-17 DIAGNOSIS — Z5111 Encounter for antineoplastic chemotherapy: Secondary | ICD-10-CM

## 2011-11-17 DIAGNOSIS — Z5112 Encounter for antineoplastic immunotherapy: Secondary | ICD-10-CM

## 2011-11-17 DIAGNOSIS — C9110 Chronic lymphocytic leukemia of B-cell type not having achieved remission: Secondary | ICD-10-CM

## 2011-11-17 LAB — CBC WITH DIFFERENTIAL/PLATELET
Basophils Absolute: 0 10*3/uL (ref 0.0–0.1)
Eosinophils Relative: 0 % (ref 0–5)
HCT: 28.1 % — ABNORMAL LOW (ref 39.0–52.0)
Lymphocytes Relative: 21 % (ref 12–46)
MCHC: 33.8 g/dL (ref 30.0–36.0)
MCV: 106.4 fL — ABNORMAL HIGH (ref 78.0–100.0)
Monocytes Absolute: 0.3 10*3/uL (ref 0.1–1.0)
RDW: 20.6 % — ABNORMAL HIGH (ref 11.5–15.5)

## 2011-11-17 LAB — BASIC METABOLIC PANEL
BUN: 16 mg/dL (ref 6–23)
CO2: 23 mEq/L (ref 19–32)
GFR calc non Af Amer: 67 mL/min — ABNORMAL LOW (ref >90)
Glucose, Bld: 194 mg/dL — ABNORMAL HIGH (ref 70–99)
Potassium: 4.4 mEq/L (ref 3.5–5.1)

## 2011-11-17 MED ORDER — DEXAMETHASONE SODIUM PHOSPHATE 10 MG/ML IJ SOLN
10.0000 mg | Freq: Once | INTRAMUSCULAR | Status: DC
Start: 2011-11-17 — End: 2011-11-17

## 2011-11-17 MED ORDER — SODIUM CHLORIDE 0.9 % IV SOLN: 8.0000 mg | Freq: Once | INTRAVENOUS | Status: DC

## 2011-11-17 MED ORDER — HEPARIN SOD (PORK) LOCK FLUSH 100 UNIT/ML IV SOLN
INTRAVENOUS | Status: AC
  Filled 2011-11-17: qty 5

## 2011-11-17 MED ORDER — SODIUM CHLORIDE 0.9 % IV SOLN
Freq: Once | INTRAVENOUS | Status: AC
  Administered 2011-11-17: 10:00:00 via INTRAVENOUS

## 2011-11-17 MED ORDER — DIPHENHYDRAMINE HCL 25 MG PO CAPS
ORAL_CAPSULE | ORAL | Status: AC
  Filled 2011-11-17: qty 2

## 2011-11-17 MED ORDER — SODIUM CHLORIDE 0.9 % IV SOLN
66.6667 mg/m2 | Freq: Once | INTRAVENOUS | Status: AC
  Administered 2011-11-17: 155 mg via INTRAVENOUS
  Filled 2011-11-17: qty 31

## 2011-11-17 MED ORDER — HEPARIN SOD (PORK) LOCK FLUSH 100 UNIT/ML IV SOLN
500.0000 [IU] | Freq: Once | INTRAVENOUS | Status: AC | PRN
  Administered 2011-11-17: 500 [IU]
  Filled 2011-11-17: qty 5

## 2011-11-17 MED ORDER — SODIUM CHLORIDE 0.9 % IV SOLN
375.0000 mg/m2 | Freq: Once | INTRAVENOUS | Status: AC
  Administered 2011-11-17: 900 mg via INTRAVENOUS
  Filled 2011-11-17: qty 90

## 2011-11-17 MED ORDER — ACETAMINOPHEN 325 MG PO TABS
ORAL_TABLET | ORAL | Status: AC
  Filled 2011-11-17: qty 2

## 2011-11-17 MED ORDER — ACETAMINOPHEN 325 MG PO TABS
650.0000 mg | ORAL_TABLET | Freq: Once | ORAL | Status: AC
  Administered 2011-11-17: 650 mg via ORAL

## 2011-11-17 MED ORDER — SODIUM CHLORIDE 0.9 % IJ SOLN
INTRAMUSCULAR | Status: AC
  Filled 2011-11-17: qty 10

## 2011-11-17 MED ORDER — ONDANSETRON HCL 4 MG/2ML IJ SOLN
Freq: Once | INTRAMUSCULAR | Status: AC
  Administered 2011-11-17: 8 mg via INTRAVENOUS
  Filled 2011-11-17: qty 4

## 2011-11-17 MED ORDER — DIPHENHYDRAMINE HCL 25 MG PO CAPS
50.0000 mg | ORAL_CAPSULE | Freq: Once | ORAL | Status: AC
  Administered 2011-11-17: 50 mg via ORAL

## 2011-11-17 MED ORDER — SODIUM CHLORIDE 0.9 % IJ SOLN
10.0000 mL | INTRAMUSCULAR | Status: DC | PRN
  Administered 2011-11-17: 10 mL
  Filled 2011-11-17: qty 10

## 2011-11-18 ENCOUNTER — Encounter (HOSPITAL_BASED_OUTPATIENT_CLINIC_OR_DEPARTMENT_OTHER): Payer: Medicare Other

## 2011-11-18 VITALS — BP 107/52 | HR 73 | Temp 97.6°F | Resp 16

## 2011-11-18 DIAGNOSIS — C9110 Chronic lymphocytic leukemia of B-cell type not having achieved remission: Secondary | ICD-10-CM

## 2011-11-18 DIAGNOSIS — Z5111 Encounter for antineoplastic chemotherapy: Secondary | ICD-10-CM

## 2011-11-18 MED ORDER — SODIUM CHLORIDE 0.9 % IV SOLN: 8.0000 mg | Freq: Once | INTRAVENOUS | Status: DC

## 2011-11-18 MED ORDER — HEPARIN SOD (PORK) LOCK FLUSH 100 UNIT/ML IV SOLN
500.0000 [IU] | Freq: Once | INTRAVENOUS | Status: AC | PRN
  Administered 2011-11-18: 500 [IU]
  Filled 2011-11-18: qty 5

## 2011-11-18 MED ORDER — HEPARIN SOD (PORK) LOCK FLUSH 100 UNIT/ML IV SOLN
INTRAVENOUS | Status: AC
  Filled 2011-11-18: qty 5

## 2011-11-18 MED ORDER — SODIUM CHLORIDE 0.9 % IV SOLN
Freq: Once | INTRAVENOUS | Status: AC
  Administered 2011-11-18: 110 mg via INTRAVENOUS
  Filled 2011-11-18: qty 4

## 2011-11-18 MED ORDER — SODIUM CHLORIDE 0.9 % IJ SOLN
INTRAMUSCULAR | Status: AC
  Filled 2011-11-18: qty 10

## 2011-11-18 MED ORDER — BENDAMUSTINE HCL (LYOPHILIZED PWD) CHEMO INJECTION 100MG
66.6667 mg/m2 | Freq: Once | INTRAVENOUS | Status: AC
  Administered 2011-11-18: 155 mg via INTRAVENOUS
  Filled 2011-11-18: qty 31

## 2011-11-18 MED ORDER — SODIUM CHLORIDE 0.9 % IJ SOLN
10.0000 mL | INTRAMUSCULAR | Status: DC | PRN
  Filled 2011-11-18: qty 10

## 2011-11-18 MED ORDER — DEXAMETHASONE SODIUM PHOSPHATE 10 MG/ML IJ SOLN: 10.0000 mg | Freq: Once | INTRAMUSCULAR | Status: DC

## 2011-11-18 MED ORDER — SODIUM CHLORIDE 0.9 % IV SOLN
Freq: Once | INTRAVENOUS | Status: AC
  Administered 2011-11-18: 1000 mL via INTRAVENOUS

## 2011-11-18 NOTE — Progress Notes (Signed)
Earl Gala tolerated infusions well and without incident; verbalizes understanding for follow-up.  No distress noted at time of discharge and patient was discharged home with his wife.  Schedule was reprinted for pt with the dates/times for him to return for Neulasta; pt verbalized understanding.

## 2011-11-19 ENCOUNTER — Encounter (HOSPITAL_BASED_OUTPATIENT_CLINIC_OR_DEPARTMENT_OTHER): Payer: Medicare Other

## 2011-11-19 DIAGNOSIS — C9110 Chronic lymphocytic leukemia of B-cell type not having achieved remission: Secondary | ICD-10-CM

## 2011-11-19 MED ORDER — PEGFILGRASTIM INJECTION 6 MG/0.6ML
6.0000 mg | Freq: Once | SUBCUTANEOUS | Status: AC
  Administered 2011-11-19: 6 mg via SUBCUTANEOUS

## 2011-11-19 MED ORDER — PEGFILGRASTIM INJECTION 6 MG/0.6ML
SUBCUTANEOUS | Status: AC
  Filled 2011-11-19: qty 0.6

## 2011-11-19 NOTE — Progress Notes (Signed)
[name] presents today for injection per MD orders. Neulasta 6mg administered SQ in left Abdomen. Administration without incident. Patient tolerated well.  

## 2011-11-24 ENCOUNTER — Encounter (HOSPITAL_BASED_OUTPATIENT_CLINIC_OR_DEPARTMENT_OTHER): Payer: Medicare Other

## 2011-11-24 ENCOUNTER — Ambulatory Visit: Payer: Medicare Other | Admitting: Family Medicine

## 2011-11-24 DIAGNOSIS — C9110 Chronic lymphocytic leukemia of B-cell type not having achieved remission: Secondary | ICD-10-CM

## 2011-11-24 DIAGNOSIS — C8190 Hodgkin lymphoma, unspecified, unspecified site: Secondary | ICD-10-CM

## 2011-11-24 LAB — CBC
HCT: 30.7 % — ABNORMAL LOW (ref 39.0–52.0)
Hemoglobin: 10.3 g/dL — ABNORMAL LOW (ref 13.0–17.0)
MCV: 108.5 fL — ABNORMAL HIGH (ref 78.0–100.0)
WBC: 4.3 10*3/uL (ref 4.0–10.5)

## 2011-11-24 NOTE — Progress Notes (Signed)
Labs drawn today for cbc 

## 2011-12-01 ENCOUNTER — Encounter (HOSPITAL_COMMUNITY): Payer: Medicare Other | Attending: Oncology

## 2011-12-01 DIAGNOSIS — C8190 Hodgkin lymphoma, unspecified, unspecified site: Secondary | ICD-10-CM | POA: Insufficient documentation

## 2011-12-01 DIAGNOSIS — C9110 Chronic lymphocytic leukemia of B-cell type not having achieved remission: Secondary | ICD-10-CM | POA: Insufficient documentation

## 2011-12-01 LAB — CBC
Hemoglobin: 10.7 g/dL — ABNORMAL LOW (ref 13.0–17.0)
MCH: 36.3 pg — ABNORMAL HIGH (ref 26.0–34.0)
MCV: 108.1 fL — ABNORMAL HIGH (ref 78.0–100.0)
RBC: 2.95 MIL/uL — ABNORMAL LOW (ref 4.22–5.81)

## 2011-12-01 NOTE — Progress Notes (Signed)
Labs drawn today for cbc 

## 2011-12-08 ENCOUNTER — Inpatient Hospital Stay (HOSPITAL_COMMUNITY): Payer: Medicare Other

## 2011-12-08 ENCOUNTER — Other Ambulatory Visit (HOSPITAL_COMMUNITY): Payer: Medicare Other

## 2011-12-09 ENCOUNTER — Inpatient Hospital Stay (HOSPITAL_COMMUNITY): Payer: Medicare Other

## 2011-12-10 ENCOUNTER — Encounter (HOSPITAL_BASED_OUTPATIENT_CLINIC_OR_DEPARTMENT_OTHER): Payer: Medicare Other | Admitting: Oncology

## 2011-12-10 ENCOUNTER — Encounter (HOSPITAL_COMMUNITY): Payer: Self-pay | Admitting: Oncology

## 2011-12-10 VITALS — BP 102/62 | HR 84 | Temp 97.7°F | Resp 18 | Wt 231.0 lb

## 2011-12-10 DIAGNOSIS — D649 Anemia, unspecified: Secondary | ICD-10-CM

## 2011-12-10 DIAGNOSIS — Z8571 Personal history of Hodgkin lymphoma: Secondary | ICD-10-CM

## 2011-12-10 DIAGNOSIS — C9110 Chronic lymphocytic leukemia of B-cell type not having achieved remission: Secondary | ICD-10-CM

## 2011-12-10 DIAGNOSIS — D801 Nonfamilial hypogammaglobulinemia: Secondary | ICD-10-CM

## 2011-12-10 NOTE — Progress Notes (Signed)
[name] Overman, MD [address] [place] [address]  1. CLL     CURRENT THERAPY: S/P 2 cycle of Bendamustine on 10/13/2011   INTERVAL HISTORY: [name] 66 y.o. male returns for  regular  visit for followup of Recurrent chronic lymphocytic leukemia.   Unfortunately, [name]'s wife has been diagnosed with endometrial cancer.  I do not know the details, but she will require chemotherapy intervention.    [name] is doing well.  He remains chronically fatigued.  He rests most of his day.  He denies any B symptoms including fevers, chills, drenching night sweats, unintentional weight loss, early satiety.     He admits to some nausea that is relieved with belching, but he denies any vomiting.  His appetite is 75% he reports.  He reports occasional LUQ abdominal pain without any initiating factor.  It resolves on its own.  It is fleeting.  He denies recognizing any patterns to the discomfort.   ROS questioning is negative.    Past Medical History  Diagnosis Date  . Aortic stenosis   . Atrial fibrillation   . Coronary atherosclerosis of native coronary artery   . Hyperlipidemia   . Essential hypertension, benign   . BPH (benign prostatic hypertrophy)   . Elevated WBC count   . Arthritis   . Acid reflux   . Hypogammaglobulinemia   . Pre-diabetes 08/04/2011  . CLL (chronic lymphoblastic leukemia)   . Hodgkin's disease   . Kidney stones     has HODGKIN'S DISEASE; CLL; HYPERLIPIDEMIA; OBESITY; CORONARY ATHEROSCLEROSIS NATIVE CORONARY ARTERY; AORTIC STENOSIS; Atrial fibrillation; FEVER UNSPECIFIED; OTHER DYSPHAGIA; AORTIC VALVE REPLACEMENT, HX OF; Essential hypertension, benign; NECK PAIN, LEFT; GERD (gastroesophageal reflux disease); Thrombocytopenia; Hypogammaglobulinemia, acquired; Anemia; Tachyarrhythmia; Pre-diabetes; UTI (lower urinary tract infection); and Cellulitis of left thigh on his problem list.      has no known allergies.  [name] does not currently  have medications on file.  Past Surgical History  Procedure Date  . Eye surgery   . Aortic valve replacement 10/09    23mm Magna Pericardial   . Esophagogastroduodenoscopy     scrapping of throat and stretching  . Lithotripsy   . Bone marrow aspiration   . Portacath placement   . 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 Colon surgery     Denies any headaches, dizziness, double vision, fevers, chills, night sweats, vomiting, diarrhea, constipation, chest pain, heart palpitations, shortness of breath, blood in stool, black tarry stool, urinary pain, urinary burning, urinary frequency, hematuria.   PHYSICAL EXAMINATION  ECOG PERFORMANCE STATUS: 2 - Symptomatic, <50% confined to bed  Filed Vitals:   12/10/11 1330  BP: 102/62  Pulse: 84  Temp: 97.7 F (36.5 C)  Resp: 18    GENERAL:alert, no distress, well nourished, well developed, comfortable, cooperative and obese, unkempt.  SKIN: skin color, texture, turgor are normal, no rashes or significant lesions HEAD: Normocephalic, No masses, lesions, tenderness or abnormalities EYES: normal, Conjunctiva are pink and non-injected EARS: External ears normal OROPHARYNX:lips, buccal mucosa, and tongue normal and mucous membranes are moist  NECK: supple, no adenopathy, trachea midline LYMPH:  splenomegaly noted 2-3 cm inferior to costophrenic margin. BREAST:not examined LUNGS: clear to auscultation and percussion HEART: regular rate & rhythm, no murmurs, no gallops, S1 normal and S2 normal ABDOMEN:abdomen soft, non-tender, obese and normal bowel sounds BACK: Back symmetric, no curvature., No CVA tenderness EXTREMITIES:less then 2 second capillary refill, no joint deformities,  effusion, or inflammation, no skin discoloration, no clubbing, no cyanosis  NEURO: alert & oriented x 3 with fluent speech, no focal motor/sensory deficits, gait normal   LABORATORY DATA: CBC    Component Value Date/Time   WBC 2.8* 12/01/2011 1015     RBC 2.95* 12/01/2011 1015   HGB 10.7* 12/01/2011 1015   HCT 31.9* 12/01/2011 1015   PLT 101* 12/01/2011 1015   MCV 108.1* 12/01/2011 1015   MCH 36.3* 12/01/2011 1015   MCHC 33.5 12/01/2011 1015   RDW 19.0* 12/01/2011 1015   LYMPHSABS 1.0 11/17/2011 0922   MONOABS 0.3 11/17/2011 0922   EOSABS 0.0 11/17/2011 0922   BASOSABS 0.0 11/17/2011 0922     ASSESSMENT:  1. Recurrent chronic lymphocytic leukemia. Presently undergoing Bendamustine chemotherapy. S/P 1 cycle.  2. 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 He is in a complete remission from the Hodgkin's.  3. Status post fludarabine, Cytoxan, and rituximab therapy for 3 cycles when he presented with CLL in 2004.  4. Obesity.  5. Insect bites, multiple places on his body.  6. Possible early onset diabetes mellitus with mild elevation in his sugars lately. We will check hemoglobin A1c next time he is here.  7. Pan-hypogammaglobulinemia.  8. Severe deconditioning.  9. History of Agent Orange exposure during the [place] War and he states that just within the last 2 months he has been granted full disability from the [place].  10.Coronary artery disease, status post bypass grafting x1 in October 2009.  11.Peripheral vascular disease.  12.Aortic stenosis with aortic valve replacement.  13.Benign prostatic hypertrophy.  14.Hyperlipidemia.  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  16.Circumcision in the past, leaving him with difficulty with a uniform stream and occasional incontinence.  17.Thrombocytopenia persistent ever since his chemotherapy for his Hodgkin disease, but getting slightly worse.  18. Anemia, thrombocytopenia, leukopenia.   PLAN:  1. I personally reviewed and went over laboratory results with the patient. 2. Pre-chemo lab work scheduled  for 12/14/2011. 3. Cycle 3 scheduled for 12/15/2011 4. We will see what his lab show next week and if he meets the treatment parameters, we will push forward with cycle 3 of chemotherapy.  5. Return in 4 weeks for follow-up.    All questions were answered. The patient knows to call the clinic with any problems, questions or concerns. We can certainly see the patient much sooner if necessary.  The patient and plan discussed with Si Gaul, MD and he is in agreement with the aforementioned.  [name]

## 2011-12-10 NOTE — Patient Instructions (Addendum)
[facility]  Discharge Instructions  RECOMMENDATIONS MADE BY THE CONSULTANT AND ANY TEST RESULTS WILL BE SENT TO YOUR REFERRING DOCTOR.   EXAM FINDINGS BY MD TODAY AND SIGNS AND SYMPTOMS TO REPORT TO CLINIC OR PRIMARY MD: exam and discussion by PA.  MEDICATIONS PRESCRIBED: none   INSTRUCTIONS GIVEN AND DISCUSSED: Other :  Report fevers, chills, shortness of breath, night sweats, etc.  SPECIAL INSTRUCTIONS/FOLLOW-UP: Lab work Needed as scheduled and Return to Clinic as scheduled for chemotherapy and in 4 weeks to be seen in follow-up.   I acknowledge that I have been informed and understand all the instructions given to me and received a copy. I do not have any more questions at this time, but understand that I may call the Specialty Clinic at [facility] at [phone] during business hours should I have any further questions or need assistance in obtaining follow-up care.    __________________________________________  _____________  __________ Signature of Patient or Authorized Representative            Date                   Time    __________________________________________ Nurse's Signature

## 2011-12-14 ENCOUNTER — Encounter (HOSPITAL_BASED_OUTPATIENT_CLINIC_OR_DEPARTMENT_OTHER): Payer: Medicare Other

## 2011-12-14 DIAGNOSIS — C8190 Hodgkin lymphoma, unspecified, unspecified site: Secondary | ICD-10-CM

## 2011-12-14 DIAGNOSIS — C9110 Chronic lymphocytic leukemia of B-cell type not having achieved remission: Secondary | ICD-10-CM

## 2011-12-14 LAB — CBC WITH DIFFERENTIAL/PLATELET
Basophils Absolute: 0 10*3/uL (ref 0.0–0.1)
Basophils Relative: 1 % (ref 0–1)
Eosinophils Absolute: 0.3 10*3/uL (ref 0.0–0.7)
Eosinophils Relative: 6 % — ABNORMAL HIGH (ref 0–5)
HCT: 32.7 % — ABNORMAL LOW (ref 39.0–52.0)
MCH: 35.5 pg — ABNORMAL HIGH (ref 26.0–34.0)
MCHC: 33.3 g/dL (ref 30.0–36.0)
MCV: 106.5 fL — ABNORMAL HIGH (ref 78.0–100.0)
Monocytes Absolute: 0.5 10*3/uL (ref 0.1–1.0)
RDW: 18 % — ABNORMAL HIGH (ref 11.5–15.5)

## 2011-12-14 LAB — COMPREHENSIVE METABOLIC PANEL
ALT: 24 U/L (ref 0–53)
AST: 27 U/L (ref 0–37)
Albumin: 3.7 g/dL (ref 3.5–5.2)
Calcium: 9.4 mg/dL (ref 8.4–10.5)
GFR calc Af Amer: 78 mL/min — ABNORMAL LOW (ref >90)
Glucose, Bld: 106 mg/dL — ABNORMAL HIGH (ref 70–99)
Potassium: 4.3 mEq/L (ref 3.5–5.1)
Sodium: 139 mEq/L (ref 135–145)
Total Protein: 6 g/dL (ref 6.0–8.3)

## 2011-12-14 NOTE — Progress Notes (Signed)
Labs drawn today for cbc/diff,esr,cmp,ldh

## 2011-12-15 ENCOUNTER — Encounter (HOSPITAL_BASED_OUTPATIENT_CLINIC_OR_DEPARTMENT_OTHER): Payer: Medicare Other

## 2011-12-15 VITALS — BP 138/82 | HR 83 | Temp 97.6°F | Resp 20 | Wt 235.0 lb

## 2011-12-15 DIAGNOSIS — C9110 Chronic lymphocytic leukemia of B-cell type not having achieved remission: Secondary | ICD-10-CM

## 2011-12-15 DIAGNOSIS — Z5111 Encounter for antineoplastic chemotherapy: Secondary | ICD-10-CM

## 2011-12-15 MED ORDER — HEPARIN SOD (PORK) LOCK FLUSH 100 UNIT/ML IV SOLN
500.0000 [IU] | Freq: Once | INTRAVENOUS | Status: AC | PRN
  Administered 2011-12-15: 500 [IU]
  Filled 2011-12-15: qty 5

## 2011-12-15 MED ORDER — SODIUM CHLORIDE 0.9 % IV SOLN
Freq: Once | INTRAVENOUS | Status: AC
  Administered 2011-12-15: 8 mg via INTRAVENOUS
  Filled 2011-12-15: qty 4

## 2011-12-15 MED ORDER — SODIUM CHLORIDE 0.9 % IJ SOLN
10.0000 mL | INTRAMUSCULAR | Status: DC | PRN
  Administered 2011-12-15: 10 mL
  Filled 2011-12-15: qty 10

## 2011-12-15 MED ORDER — SODIUM CHLORIDE 0.9 % IV SOLN
Freq: Once | INTRAVENOUS | Status: AC
  Administered 2011-12-15: 09:00:00 via INTRAVENOUS

## 2011-12-15 MED ORDER — DEXAMETHASONE SODIUM PHOSPHATE 10 MG/ML IJ SOLN: 10.0000 mg | Freq: Once | INTRAMUSCULAR | Status: DC

## 2011-12-15 MED ORDER — HEPARIN SOD (PORK) LOCK FLUSH 100 UNIT/ML IV SOLN
INTRAVENOUS | Status: AC
  Filled 2011-12-15: qty 5

## 2011-12-15 MED ORDER — SODIUM CHLORIDE 0.9 % IV SOLN: 8.0000 mg | Freq: Once | INTRAVENOUS | Status: DC

## 2011-12-15 MED ORDER — SODIUM CHLORIDE 0.9 % IV SOLN
66.6667 mg/m2 | Freq: Once | INTRAVENOUS | Status: AC
  Administered 2011-12-15: 155 mg via INTRAVENOUS
  Filled 2011-12-15: qty 31

## 2011-12-15 MED ORDER — SODIUM CHLORIDE 0.9 % IV SOLN
375.0000 mg/m2 | Freq: Once | INTRAVENOUS | Status: AC
  Administered 2011-12-15: 900 mg via INTRAVENOUS
  Filled 2011-12-15: qty 90

## 2011-12-15 NOTE — Progress Notes (Signed)
Tolerated chemo well. 

## 2011-12-16 ENCOUNTER — Encounter (HOSPITAL_BASED_OUTPATIENT_CLINIC_OR_DEPARTMENT_OTHER): Payer: Medicare Other

## 2011-12-16 VITALS — BP 117/72 | HR 76 | Temp 97.4°F | Resp 20 | Wt 240.0 lb

## 2011-12-16 DIAGNOSIS — Z5111 Encounter for antineoplastic chemotherapy: Secondary | ICD-10-CM

## 2011-12-16 DIAGNOSIS — C9110 Chronic lymphocytic leukemia of B-cell type not having achieved remission: Secondary | ICD-10-CM

## 2011-12-16 MED ORDER — SODIUM CHLORIDE 0.9 % IV SOLN
Freq: Once | INTRAVENOUS | Status: AC
  Administered 2011-12-16: 8 mg via INTRAVENOUS
  Filled 2011-12-16: qty 4

## 2011-12-16 MED ORDER — SODIUM CHLORIDE 0.9 % IJ SOLN
10.0000 mL | INTRAMUSCULAR | Status: DC | PRN
  Administered 2011-12-16: 10 mL
  Filled 2011-12-16: qty 10

## 2011-12-16 MED ORDER — SODIUM CHLORIDE 0.9 % IV SOLN
66.6667 mg/m2 | Freq: Once | INTRAVENOUS | Status: AC
  Administered 2011-12-16: 155 mg via INTRAVENOUS
  Filled 2011-12-16: qty 31

## 2011-12-16 MED ORDER — HEPARIN SOD (PORK) LOCK FLUSH 100 UNIT/ML IV SOLN
500.0000 [IU] | Freq: Once | INTRAVENOUS | Status: AC | PRN
  Administered 2011-12-16: 500 [IU]
  Filled 2011-12-16: qty 5

## 2011-12-16 MED ORDER — SODIUM CHLORIDE 0.9 % IV SOLN: 8.0000 mg | Freq: Once | INTRAVENOUS | Status: DC

## 2011-12-16 MED ORDER — HEPARIN SOD (PORK) LOCK FLUSH 100 UNIT/ML IV SOLN
INTRAVENOUS | Status: AC
  Filled 2011-12-16: qty 5

## 2011-12-16 MED ORDER — SODIUM CHLORIDE 0.9 % IV SOLN
Freq: Once | INTRAVENOUS | Status: AC
  Administered 2011-12-16: 11:00:00 via INTRAVENOUS

## 2011-12-16 MED ORDER — DEXAMETHASONE SODIUM PHOSPHATE 10 MG/ML IJ SOLN: 10.0000 mg | Freq: Once | INTRAMUSCULAR | Status: DC

## 2011-12-16 MED ORDER — SODIUM CHLORIDE 0.9 % IJ SOLN
INTRAMUSCULAR | Status: AC
  Filled 2011-12-16: qty 10

## 2011-12-17 ENCOUNTER — Encounter (HOSPITAL_BASED_OUTPATIENT_CLINIC_OR_DEPARTMENT_OTHER): Payer: Medicare Other

## 2011-12-17 VITALS — BP 120/76 | HR 73 | Temp 97.5°F | Resp 20

## 2011-12-17 DIAGNOSIS — C9110 Chronic lymphocytic leukemia of B-cell type not having achieved remission: Secondary | ICD-10-CM

## 2011-12-17 MED ORDER — PEGFILGRASTIM INJECTION 6 MG/0.6ML
SUBCUTANEOUS | Status: AC
  Filled 2011-12-17: qty 0.6

## 2011-12-17 MED ORDER — PEGFILGRASTIM INJECTION 6 MG/0.6ML
6.0000 mg | Freq: Once | SUBCUTANEOUS | Status: AC
  Administered 2011-12-17: 6 mg via SUBCUTANEOUS

## 2011-12-17 NOTE — Progress Notes (Signed)
[name] presents today for injection per MD orders. Neulasta 6mg  administered SQ in left Abdomen. Administration without incident. Patient tolerated well.

## 2012-01-01 ENCOUNTER — Other Ambulatory Visit: Payer: Self-pay | Admitting: Cardiology

## 2012-01-05 ENCOUNTER — Other Ambulatory Visit (HOSPITAL_COMMUNITY): Payer: Self-pay | Admitting: Oncology

## 2012-01-07 ENCOUNTER — Encounter (HOSPITAL_COMMUNITY): Payer: Self-pay | Admitting: Oncology

## 2012-01-07 ENCOUNTER — Encounter (HOSPITAL_COMMUNITY): Payer: Medicare Other | Attending: Oncology | Admitting: Oncology

## 2012-01-07 VITALS — BP 100/65 | HR 78 | Temp 98.4°F | Resp 18 | Wt 230.0 lb

## 2012-01-07 DIAGNOSIS — C9110 Chronic lymphocytic leukemia of B-cell type not having achieved remission: Secondary | ICD-10-CM | POA: Insufficient documentation

## 2012-01-07 MED ORDER — SULFAMETHOXAZOLE-TRIMETHOPRIM 800-160 MG PO TABS: 1.0000 | ORAL_TABLET | Freq: Every day | ORAL | Status: DC

## 2012-01-07 NOTE — Progress Notes (Signed)
[name] Overman, MD [address] [place] [address]  1. CLL     CURRENT THERAPY: S/P 3 cycle of Bendamustine on starting on 10/13/2011   INTERVAL HISTORY: [name] 66 y.o. male returns for  regular  visit for followup of Recurrent chronic lymphocytic leukemia.  He is due for cycle 4 of Bendamustine on 01/12/2012.  I personally reviewed and went over laboratory results with the patient.  [name] reports that he needs cataract removal intervention.  He was told this information before beginning therapy, but he put this on the back-burner since he was having hematologic issues. I have asked him to wait until the completion of chemotherapy.   He also reports that he needs to see a dentist in the future due to his poor dental status.  I provided him education regarding the logistics of seeing a dentist.  I recommended seeing a dentist right before he is due for chemotherapy administration since this is when his counts will be at their highest.   He can do this or he can wait until after chemotherapy administration.  He has opted to wait until the completion of chemotherapy.  Otherwise, complete ROS questioning is negative.   Past Medical History  Diagnosis Date  . Aortic stenosis   . Atrial fibrillation   . Coronary atherosclerosis of native coronary artery   . Hyperlipidemia   . Essential hypertension, benign   . BPH (benign prostatic hypertrophy)   . Elevated WBC count   . Arthritis   . Acid reflux   . Hypogammaglobulinemia   . Pre-diabetes 08/04/2011  . CLL (chronic lymphoblastic leukemia)   . Hodgkin's disease(201)   . Kidney stones     has HODGKIN'S DISEASE; CLL; HYPERLIPIDEMIA; OBESITY; CORONARY ATHEROSCLEROSIS NATIVE CORONARY ARTERY; AORTIC STENOSIS; Atrial fibrillation; FEVER UNSPECIFIED; OTHER DYSPHAGIA; AORTIC VALVE REPLACEMENT, HX OF; Essential hypertension, benign; NECK PAIN, LEFT; GERD (gastroesophageal reflux disease); Thrombocytopenia;  Hypogammaglobulinemia, acquired; Anemia; Tachyarrhythmia; Pre-diabetes; UTI (lower urinary tract infection); and Cellulitis of left thigh on his problem list.      has no known allergies.  [name] had no medications administered during this visit.  Past Surgical History  Procedure Date  . Eye surgery   . Aortic valve replacement 10/09    23mm Magna Pericardial   . Esophagogastroduodenoscopy     scrapping of throat and stretching  . Lithotripsy   . Bone marrow aspiration   . Portacath placement   . 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 Colon surgery     Denies any headaches, dizziness, double vision, fevers, chills, night sweats, nausea, vomiting, diarrhea, constipation, chest pain, heart palpitations, shortness of breath, blood in stool, black tarry stool, urinary pain, urinary burning, urinary frequency, hematuria.   PHYSICAL EXAMINATION  ECOG PERFORMANCE STATUS: 2 - Symptomatic, <50% confined to bed  Filed Vitals:   01/07/12 1200  BP: 100/65  Pulse: 78  Temp: 98.4 F (36.9 C)  Resp: 18   GENERAL:alert, no distress, well nourished, well developed, comfortable, cooperative, obese, smiling and feline urine smell SKIN: skin color, texture, turgor are normal, no rashes or significant lesions HEAD: Normocephalic, No masses, lesions, tenderness or abnormalities EYES: normal, Conjunctiva are pink and non-injected EARS: External ears normal OROPHARYNX:lips, buccal mucosa, and tongue normal and mucous membranes are moist  NECK: supple, no adenopathy, thyroid normal size, non-tender, without nodularity, no stridor, non-tender, trachea midline LYMPH:  no palpable lymphadenopathy BREAST:not examined LUNGS: clear to auscultation and  percussion HEART: regular rate & rhythm, no murmurs, no gallops, S1 normal and S2 normal ABDOMEN:abdomen soft, non-tender, obese and normal bowel sounds BACK: Back symmetric, no curvature., No CVA tenderness EXTREMITIES:less then 2  second capillary refill, no joint deformities, effusion, or inflammation, no edema, no skin discoloration, no clubbing, no cyanosis, multiple ecchymoses  NEURO: alert & oriented x 3 with fluent speech, no focal motor/sensory deficits, gait normal, slow speech    LABORATORY DATA: CBC    Component Value Date/Time   WBC 4.2 12/14/2011 0945   RBC 3.07* 12/14/2011 0945   HGB 10.9* 12/14/2011 0945   HCT 32.7* 12/14/2011 0945   PLT 154 12/14/2011 0945   MCV 106.5* 12/14/2011 0945   MCH 35.5* 12/14/2011 0945   MCHC 33.3 12/14/2011 0945   RDW 18.0* 12/14/2011 0945   LYMPHSABS 2.0 12/14/2011 0945   MONOABS 0.5 12/14/2011 0945   EOSABS 0.3 12/14/2011 0945   BASOSABS 0.0 12/14/2011 0945      Chemistry      Component Value Date/Time   NA 139 12/14/2011 0945   K 4.3 12/14/2011 0945   CL 105 12/14/2011 0945   CO2 25 12/14/2011 0945   BUN 10 12/14/2011 0945   CREATININE 1.10 12/14/2011 0945   CREATININE 1.29 05/10/2011 1541      Component Value Date/Time   CALCIUM 9.4 12/14/2011 0945   ALKPHOS 112 12/14/2011 0945   AST 27 12/14/2011 0945   ALT 24 12/14/2011 0945   BILITOT 0.6 12/14/2011 0945         ASSESSMENT: 1. Recurrent chronic lymphocytic leukemia. Presently undergoing Bendamustine chemotherapy. S/P 3 cycle.  2. 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 He is in a complete remission from the Hodgkin's.  3. Status post fludarabine, Cytoxan, and rituximab therapy for 3 cycles when he presented with CLL in 2004.  4. Obesity.  5. Insect bites, multiple places on his body.  6. Possible early onset diabetes mellitus with mild elevation in his sugars lately. We will check hemoglobin A1c next time he is here.  7. Pan-hypogammaglobulinemia.  8. Severe deconditioning.  9. History of Agent Orange exposure during the  [place] War and he states that just within the last 2 months he has been granted full disability from the [place].  10.Coronary artery disease, status post bypass grafting x1 in October 2009.  11.Peripheral vascular disease.  12.Aortic stenosis with aortic valve replacement.  13.Benign prostatic hypertrophy.  14.Hyperlipidemia.  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  16.Circumcision in the past, leaving him with difficulty with a uniform stream and occasional incontinence.  17.Thrombocytopenia persistent ever since his chemotherapy for his Hodgkin disease, but getting slightly worse.  18. Anemia, thrombocytopenia, leukopenia.   PLAN:  1. I personally reviewed and went over laboratory results with the patient. 2. Pre-chemotherapy lab work before cycle 4 which is scheduled for 01/12/2012. 3. Pre-chemotherapy lab work ordered: CBC diff, CMET, LDH, ESR 4. Discussion regarding logistics of cataract surgery and dental examination. 5. Return in 4 weeks for follow-up.  Will move forward with cycle 4 as scheduled pending lab results.   All questions were answered. The patient knows to call the clinic with any problems, questions or concerns. We can certainly see the patient much sooner if necessary.  KEFALAS,THOMAS

## 2012-01-07 NOTE — Patient Instructions (Addendum)
[facility]  Discharge Instructions  RECOMMENDATIONS MADE BY THE CONSULTANT AND ANY TEST RESULTS WILL BE SENT TO YOUR REFERRING DOCTOR.   EXAM FINDINGS BY MD TODAY AND SIGNS AND SYMPTOMS TO REPORT TO CLINIC OR PRIMARY MD: exam and discussion by PA.  Keep legs elevated when you are sitting down.  MEDICATIONS PRESCRIBED: Refills for Allopurinol and Bactrim e-scribed Follow label directions  INSTRUCTIONS GIVEN AND DISCUSSED: Other :  Report fevers, chills, uncontrolled nausea or vomiting.  SPECIAL INSTRUCTIONS/FOLLOW-UP: Lab work Needed as scheduled and Return to Clinic as scheduled.   I acknowledge that I have been informed and understand all the instructions given to me and received a copy. I do not have any more questions at this time, but understand that I may call the Specialty Clinic at [facility] at [phone] during business hours should I have any further questions or need assistance in obtaining follow-up care.    __________________________________________  _____________  __________ Signature of Patient or Authorized Representative            Date                   Time    __________________________________________ Nurse's Signature

## 2012-01-10 ENCOUNTER — Encounter (HOSPITAL_BASED_OUTPATIENT_CLINIC_OR_DEPARTMENT_OTHER): Payer: Medicare Other

## 2012-01-10 DIAGNOSIS — C9110 Chronic lymphocytic leukemia of B-cell type not having achieved remission: Secondary | ICD-10-CM

## 2012-01-10 LAB — COMPREHENSIVE METABOLIC PANEL
ALT: 23 U/L (ref 0–53)
AST: 26 U/L (ref 0–37)
GFR calc non Af Amer: 65 mL/min — ABNORMAL LOW (ref >90)
Total Bilirubin: 0.6 mg/dL (ref 0.3–1.2)

## 2012-01-10 LAB — CBC WITH DIFFERENTIAL/PLATELET
Basophils Relative: 1 % (ref 0–1)
Eosinophils Absolute: 0.6 10*3/uL (ref 0.0–0.7)
Eosinophils Relative: 16 % — ABNORMAL HIGH (ref 0–5)
HCT: 30.4 % — ABNORMAL LOW (ref 39.0–52.0)
Hemoglobin: 10.2 g/dL — ABNORMAL LOW (ref 13.0–17.0)
Lymphs Abs: 0.9 10*3/uL (ref 0.7–4.0)
MCH: 35.1 pg — ABNORMAL HIGH (ref 26.0–34.0)
MCHC: 33.6 g/dL (ref 30.0–36.0)
MCV: 104.5 fL — ABNORMAL HIGH (ref 78.0–100.0)
Monocytes Absolute: 0.4 10*3/uL (ref 0.1–1.0)
Monocytes Relative: 12 % (ref 3–12)
Neutrophils Relative %: 46 % (ref 43–77)
RBC: 2.91 MIL/uL — ABNORMAL LOW (ref 4.22–5.81)

## 2012-01-10 NOTE — Progress Notes (Signed)
Labs drawn today for cbc/diff,sed rate, smp,ldh

## 2012-01-12 ENCOUNTER — Encounter (HOSPITAL_BASED_OUTPATIENT_CLINIC_OR_DEPARTMENT_OTHER): Payer: Medicare Other

## 2012-01-12 VITALS — BP 128/61 | HR 82 | Temp 97.9°F | Resp 18

## 2012-01-12 DIAGNOSIS — Z5112 Encounter for antineoplastic immunotherapy: Secondary | ICD-10-CM

## 2012-01-12 DIAGNOSIS — Z5111 Encounter for antineoplastic chemotherapy: Secondary | ICD-10-CM

## 2012-01-12 DIAGNOSIS — C9110 Chronic lymphocytic leukemia of B-cell type not having achieved remission: Secondary | ICD-10-CM

## 2012-01-12 MED ORDER — SODIUM CHLORIDE 0.9 % IV SOLN
375.0000 mg/m2 | Freq: Once | INTRAVENOUS | Status: AC
  Administered 2012-01-12: 900 mg via INTRAVENOUS
  Filled 2012-01-12: qty 90

## 2012-01-12 MED ORDER — SODIUM CHLORIDE 0.9 % IV SOLN: 8.0000 mg | Freq: Once | INTRAVENOUS | Status: DC

## 2012-01-12 MED ORDER — HEPARIN SOD (PORK) LOCK FLUSH 100 UNIT/ML IV SOLN
500.0000 [IU] | Freq: Once | INTRAVENOUS | Status: AC | PRN
  Administered 2012-01-12: 500 [IU]
  Filled 2012-01-12: qty 5

## 2012-01-12 MED ORDER — DIPHENHYDRAMINE HCL 25 MG PO CAPS
ORAL_CAPSULE | ORAL | Status: AC
  Filled 2012-01-12: qty 2

## 2012-01-12 MED ORDER — DIPHENHYDRAMINE HCL 25 MG PO CAPS: 50.0000 mg | ORAL_CAPSULE | Freq: Once | ORAL | Status: DC

## 2012-01-12 MED ORDER — SODIUM CHLORIDE 0.9 % IV SOLN
Freq: Once | INTRAVENOUS | Status: AC
  Administered 2012-01-12: 8 mg via INTRAVENOUS
  Filled 2012-01-12: qty 4

## 2012-01-12 MED ORDER — SODIUM CHLORIDE 0.9 % IJ SOLN
10.0000 mL | INTRAMUSCULAR | Status: DC | PRN
  Filled 2012-01-12: qty 10

## 2012-01-12 MED ORDER — DEXAMETHASONE SODIUM PHOSPHATE 10 MG/ML IJ SOLN: 10.0000 mg | Freq: Once | INTRAMUSCULAR | Status: DC

## 2012-01-12 MED ORDER — ACETAMINOPHEN 325 MG PO TABS
ORAL_TABLET | ORAL | Status: AC
  Filled 2012-01-12: qty 2

## 2012-01-12 MED ORDER — SODIUM CHLORIDE 0.9 % IV SOLN
Freq: Once | INTRAVENOUS | Status: AC
  Administered 2012-01-12: 09:00:00 via INTRAVENOUS

## 2012-01-12 MED ORDER — ACETAMINOPHEN 325 MG PO TABS: 650.0000 mg | ORAL_TABLET | Freq: Once | ORAL | Status: DC

## 2012-01-12 MED ORDER — SODIUM CHLORIDE 0.9 % IV SOLN
66.6667 mg/m2 | Freq: Once | INTRAVENOUS | Status: AC
  Administered 2012-01-12: 155 mg via INTRAVENOUS
  Filled 2012-01-12: qty 31

## 2012-01-13 ENCOUNTER — Encounter (HOSPITAL_BASED_OUTPATIENT_CLINIC_OR_DEPARTMENT_OTHER): Payer: Medicare Other

## 2012-01-13 VITALS — BP 107/72 | HR 73 | Temp 98.0°F | Resp 18

## 2012-01-13 DIAGNOSIS — C9110 Chronic lymphocytic leukemia of B-cell type not having achieved remission: Secondary | ICD-10-CM

## 2012-01-13 DIAGNOSIS — Z5111 Encounter for antineoplastic chemotherapy: Secondary | ICD-10-CM

## 2012-01-13 MED ORDER — SODIUM CHLORIDE 0.9 % IV SOLN: 8.0000 mg | Freq: Once | INTRAVENOUS | Status: DC

## 2012-01-13 MED ORDER — SODIUM CHLORIDE 0.9 % IV SOLN
66.6667 mg/m2 | Freq: Once | INTRAVENOUS | Status: AC
  Administered 2012-01-13: 155 mg via INTRAVENOUS
  Filled 2012-01-13: qty 31

## 2012-01-13 MED ORDER — DEXAMETHASONE SODIUM PHOSPHATE 10 MG/ML IJ SOLN: 10.0000 mg | Freq: Once | INTRAMUSCULAR | Status: DC

## 2012-01-13 MED ORDER — SODIUM CHLORIDE 0.9 % IV SOLN
Freq: Once | INTRAVENOUS | Status: AC
  Administered 2012-01-13: 10:00:00 via INTRAVENOUS

## 2012-01-13 MED ORDER — HEPARIN SOD (PORK) LOCK FLUSH 100 UNIT/ML IV SOLN
500.0000 [IU] | Freq: Once | INTRAVENOUS | Status: AC | PRN
  Administered 2012-01-13: 500 [IU]
  Filled 2012-01-13: qty 5

## 2012-01-13 MED ORDER — SODIUM CHLORIDE 0.9 % IV SOLN
Freq: Once | INTRAVENOUS | Status: AC
  Administered 2012-01-13: 8 mg via INTRAVENOUS
  Filled 2012-01-13: qty 4

## 2012-01-13 MED ORDER — HEPARIN SOD (PORK) LOCK FLUSH 100 UNIT/ML IV SOLN
INTRAVENOUS | Status: AC
  Filled 2012-01-13: qty 5

## 2012-01-13 MED ORDER — SODIUM CHLORIDE 0.9 % IJ SOLN
INTRAMUSCULAR | Status: AC
  Filled 2012-01-13: qty 10

## 2012-01-14 ENCOUNTER — Encounter (HOSPITAL_BASED_OUTPATIENT_CLINIC_OR_DEPARTMENT_OTHER): Payer: Medicare Other

## 2012-01-14 VITALS — BP 102/58 | HR 70

## 2012-01-14 DIAGNOSIS — C9110 Chronic lymphocytic leukemia of B-cell type not having achieved remission: Secondary | ICD-10-CM

## 2012-01-14 MED ORDER — PEGFILGRASTIM INJECTION 6 MG/0.6ML
6.0000 mg | Freq: Once | SUBCUTANEOUS | Status: AC
  Administered 2012-01-14: 6 mg via SUBCUTANEOUS

## 2012-01-14 MED ORDER — PEGFILGRASTIM INJECTION 6 MG/0.6ML
SUBCUTANEOUS | Status: AC
  Filled 2012-01-14: qty 0.6

## 2012-01-14 NOTE — Progress Notes (Signed)
Tolerated injection well. 

## 2012-01-17 ENCOUNTER — Observation Stay (HOSPITAL_COMMUNITY)
Admission: EM | Admit: 2012-01-17 | Discharge: 2012-01-18 | Disposition: A | Discharge status: Home / Self Care | Payer: Medicare Other | Attending: Emergency Medicine | Admitting: Emergency Medicine

## 2012-01-17 ENCOUNTER — Encounter (HOSPITAL_COMMUNITY): Payer: Self-pay | Admitting: *Deleted

## 2012-01-17 ENCOUNTER — Emergency Department (HOSPITAL_COMMUNITY): Payer: Medicare Other

## 2012-01-17 DIAGNOSIS — R509 Fever, unspecified: Secondary | ICD-10-CM

## 2012-01-17 DIAGNOSIS — D729 Disorder of white blood cells, unspecified: Secondary | ICD-10-CM | POA: Insufficient documentation

## 2012-01-17 DIAGNOSIS — Z79899 Other long term (current) drug therapy: Secondary | ICD-10-CM | POA: Insufficient documentation

## 2012-01-17 DIAGNOSIS — C9110 Chronic lymphocytic leukemia of B-cell type not having achieved remission: Principal | ICD-10-CM | POA: Insufficient documentation

## 2012-01-17 DIAGNOSIS — D801 Nonfamilial hypogammaglobulinemia: Secondary | ICD-10-CM | POA: Insufficient documentation

## 2012-01-17 DIAGNOSIS — E785 Hyperlipidemia, unspecified: Secondary | ICD-10-CM | POA: Insufficient documentation

## 2012-01-17 DIAGNOSIS — N200 Calculus of kidney: Secondary | ICD-10-CM | POA: Insufficient documentation

## 2012-01-17 DIAGNOSIS — I10 Essential (primary) hypertension: Secondary | ICD-10-CM | POA: Insufficient documentation

## 2012-01-17 DIAGNOSIS — R7309 Other abnormal glucose: Secondary | ICD-10-CM | POA: Insufficient documentation

## 2012-01-17 DIAGNOSIS — Z8571 Personal history of Hodgkin lymphoma: Secondary | ICD-10-CM | POA: Insufficient documentation

## 2012-01-17 DIAGNOSIS — K219 Gastro-esophageal reflux disease without esophagitis: Secondary | ICD-10-CM | POA: Insufficient documentation

## 2012-01-17 DIAGNOSIS — M129 Arthropathy, unspecified: Secondary | ICD-10-CM | POA: Insufficient documentation

## 2012-01-17 DIAGNOSIS — D696 Thrombocytopenia, unspecified: Secondary | ICD-10-CM | POA: Insufficient documentation

## 2012-01-17 DIAGNOSIS — N400 Enlarged prostate without lower urinary tract symptoms: Secondary | ICD-10-CM | POA: Insufficient documentation

## 2012-01-17 DIAGNOSIS — Z9889 Other specified postprocedural states: Secondary | ICD-10-CM | POA: Insufficient documentation

## 2012-01-17 DIAGNOSIS — I4891 Unspecified atrial fibrillation: Secondary | ICD-10-CM | POA: Insufficient documentation

## 2012-01-17 LAB — URINALYSIS, ROUTINE W REFLEX MICROSCOPIC
Glucose, UA: NEGATIVE mg/dL
Hgb urine dipstick: NEGATIVE
Ketones, ur: NEGATIVE mg/dL
Leukocytes, UA: NEGATIVE
Protein, ur: NEGATIVE mg/dL
pH: 6 (ref 5.0–8.0)

## 2012-01-17 LAB — COMPREHENSIVE METABOLIC PANEL
AST: 16 U/L (ref 0–37)
Albumin: 3.1 g/dL — ABNORMAL LOW (ref 3.5–5.2)
BUN: 16 mg/dL (ref 6–23)
Calcium: 8.3 mg/dL — ABNORMAL LOW (ref 8.4–10.5)
Creatinine, Ser: 1.32 mg/dL (ref 0.50–1.35)
Total Protein: 5 g/dL — ABNORMAL LOW (ref 6.0–8.3)

## 2012-01-17 LAB — CBC WITH DIFFERENTIAL/PLATELET
Basophils Absolute: 0 10*3/uL (ref 0.0–0.1)
Basophils Relative: 0 % (ref 0–1)
Eosinophils Absolute: 0.6 10*3/uL (ref 0.0–0.7)
Eosinophils Relative: 5 % (ref 0–5)
HCT: 29.4 % — ABNORMAL LOW (ref 39.0–52.0)
MCH: 36.2 pg — ABNORMAL HIGH (ref 26.0–34.0)
MCHC: 34.4 g/dL (ref 30.0–36.0)
Monocytes Absolute: 0.9 10*3/uL (ref 0.1–1.0)
Neutro Abs: 11.1 10*3/uL — ABNORMAL HIGH (ref 1.7–7.7)
RDW: 16.8 % — ABNORMAL HIGH (ref 11.5–15.5)

## 2012-01-17 MED ORDER — DEXTROSE 5 % IV SOLN
1.0000 g | Freq: Once | INTRAVENOUS | Status: AC
  Administered 2012-01-17: 1 g via INTRAVENOUS
  Filled 2012-01-17 (×2): qty 10

## 2012-01-17 MED ORDER — DEXTROSE 5 % IV SOLN
1.0000 g | Freq: Once | INTRAVENOUS | Status: AC
  Administered 2012-01-17: 1 g via INTRAVENOUS
  Filled 2012-01-17: qty 10

## 2012-01-17 MED ORDER — CEFTRIAXONE SODIUM 1 G IJ SOLR
2.0000 g | Freq: Once | INTRAMUSCULAR | Status: DC
Start: 2012-01-17 — End: 2012-01-17

## 2012-01-17 NOTE — ED Notes (Signed)
Gave patient urinal to collect urine sample

## 2012-01-17 NOTE — ED Notes (Signed)
Fever, onset today, Pt is on chemo,for CLL

## 2012-01-17 NOTE — ED Provider Notes (Signed)
History   This chart was scribed for Jones Skene, MD by Gerlean Ren. This patient was seen in room APA01/APA01 and the patient's care was started at 21:05.   CSN: [id number]  Arrival date & time 01/17/12  2026   First MD Initiated Contact with Patient 01/17/12 2056      Chief Complaint  Patient presents with  . Fever    (Consider location/radiation/quality/duration/timing/severity/associated sxs/prior treatment) The history is provided by the patient. No language interpreter was used.   [name] is a 66 y.o. male with h/o CLL who presents to the Emergency Department complaining of fever as high as 100.9 with associated mild weakness beginning several hours ago.  Last chemotherapy was administered 5 days ago.  Pt denies neck pain, sore throat, visual disturbance, CP, cough, dyspnea, abdominal pain, nausea, emesis, diarrhea, urinary symptoms, back pain, HA, weakness, numbness and rash as associated symptoms.  Pt denies tobacco and alcohol use.     Past Medical History  Diagnosis Date  . Aortic stenosis   . Atrial fibrillation   . Coronary atherosclerosis of native coronary artery   . Hyperlipidemia   . Essential hypertension, benign   . BPH (benign prostatic hypertrophy)   . Elevated WBC count   . Arthritis   . Acid reflux   . Hypogammaglobulinemia   . Pre-diabetes 08/04/2011  . CLL (chronic lymphoblastic leukemia)   . Hodgkin's disease(201)   . Kidney stones     Past Surgical History  Procedure Date  . Eye surgery   . Aortic valve replacement 10/09    23mm Magna Pericardial   . Esophagogastroduodenoscopy     scrapping of throat and stretching  . Lithotripsy   . Bone marrow aspiration   . Portacath placement   . 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 Colon surgery     Family History  Problem Relation Age of Onset  . Heart failure Mother   . Blindness Mother   . Cancer Father     Lung     History  Substance Use Topics  . Smoking  status: Never Smoker   . Smokeless tobacco: Never Used   Comment: pt denies tobacco use   . Alcohol Use: No     pt denies alcohol       Review of Systems REVIEW OF SYSTEMS:   1.) CONSTITUTIONAL: No chills or systemic signs of infection. No recent, unexplained weight changes.   2.) HEENT: No facial pain, sinus congestion or rhinorrhea is reported. Patient is denying any acute visual or hearing deficits. No sore throat or difficulty swallowing.  3.) NECK: No swelling or masses are reported.   4.) PULMONARY: No cough sputum production or shortness of breath was reported.   5.) CARDIAC: No palpitations, chest pain or pressure.   6.) ABDOMINAL: Denies abdominal pain, nausea, vomiting or diarrhea. No Hematochezia or melena.  7.) GENITOURINARY: No burning with urination or frequency. No discharge.  8.) BACK: Denying any flank or CVA tenderness. No specific thoracic or lumbar pain.   9.) EXTREMITIES: Denying any extremity edema pitting or rash.   10.) NEUROLOGIC: Denying any focal or lateralizing neurologic impairments.     11.) SKIN: No rashes, itching  12.) HEME/LYMPH: No easy bruising/bleeding, no lymphadenopathy  Allergies  Review of patient's allergies indicates no known allergies.  Home Medications   Current Outpatient Rx  Name Route Sig Dispense Refill  . ACETAMINOPHEN 325 MG PO TABS Oral Take 650 mg by  mouth once. Take 2 tablets (=650mg ) 1 hour prior to Rituxan.    . ACETAMINOPHEN 500 MG PO TABS Oral Take 1,000 mg by mouth once as needed. For pain     . ACYCLOVIR 200 MG PO CAPS Oral Take 200 mg by mouth 2 (two) times daily. Take one tablet by mouth twice a day     . ALLOPURINOL 300 MG PO TABS  TAKE ONE TABLET BY MOUTH EVERY DAY 30 tablet 1  . ASPIRIN 81 MG PO TBEC Oral Take 81 mg by mouth daily. Take one tablet by mouth daily     . CRESTOR 40 MG PO TABS  TAKE ONE TABLET BY MOUTH EVERY DAY AT BEDTIME 30 tablet 5  . DEXAMETHASONE 4 MG PO TABS  Starting the day before  chemo, take 2 tablets in the am and 2 tablets in the pm. The morning of Rituxan take 2 tablets in the am. Then starting the day after chemo, take 2 tablets in the am x 2 days. Take with food.    Marland Kitchen DIPHENHYDRAMINE HCL 25 MG PO TABS Oral Take 50 mg by mouth. Take 2 tablets (=50mg ) 1 hour prior to Rituxan.    Marland Kitchen FOLIC ACID 1 MG PO TABS Oral Take 1 mg by mouth daily.    Marland Kitchen LORAZEPAM 1 MG PO TABS Oral Take 1 tablet (1 mg total) by mouth every 4 (four) hours as needed (Nausea or vomiting). 30 tablet 1  . METOPROLOL TARTRATE 50 MG PO TABS Oral Take 50 mg by mouth 2 (two) times daily.    . CENTRUM SILVER ULTRA MENS PO Oral Take 1 tablet by mouth every morning.     Marland Kitchen OMEGA 3 1000 MG PO CAPS Oral Take 1,000 mg by mouth every morning.     Marland Kitchen OMEPRAZOLE 20 MG PO CPDR Oral Take 20 mg by mouth every morning.     Marland Kitchen ONDANSETRON HCL 8 MG PO TABS  Take 1 tablet two times a day starting the day after chemo for 2 days. Then take 1 tablet two times a day as needed for nausea or vomiting. 30 tablet 1  . PROCHLORPERAZINE MALEATE 10 MG PO TABS Oral Take 1 tablet (10 mg total) by mouth every 6 (six) hours as needed (Nausea or vomiting). 30 tablet 1  . RITUXAN IV Intravenous Inject into the vein every 28 (twenty-eight) days.    Marland Kitchen BENDAMUSTINE CHEMO IV INFUSION Intravenous Inject into the vein once. Every 28 days    . SULFAMETHOXAZOLE-TRIMETHOPRIM 800-160 MG PO TABS Oral Take 1 tablet by mouth daily. Taking Mondays, Wednesdays and Fridays 30 tablet 1    BP 108/60  Pulse 93  Temp 98.8 F (37.1 C) (Oral)  Resp 20  Ht 5\' 10"  (1.778 m)  Wt 230 lb (104.327 kg)  BMI 33.00 kg/m2  SpO2 97%  Physical Exam  Nursing notes reviewed.  Electronic medical record reviewed. VITAL SIGNS:   Filed Vitals:   01/17/12 2028  BP: 108/60  Pulse: 93  Temp: 98.8 F (37.1 C)  TempSrc: Oral  Resp: 20  Height: 5\' 10"  (1.778 m)  Weight: 230 lb (104.327 kg)  SpO2: 97%   CONSTITUTIONAL: Awake, oriented, appears non-toxic, smells  cats HENT: Atraumatic, normocephalic, oral mucosa pink and moist, airway patent. Nares patent without drainage. External ears normal. EYES: Conjunctiva clear, EOMI, PERRLA NECK: Trachea midline, non-tender, supple CARDIOVASCULAR: Normal heart rate, Normal rhythm, No murmurs, rubs, gallops PULMONARY/CHEST: Clear to auscultation, no rhonchi, wheezes, or rales. Symmetrical breath sounds. Non-tender.  ABDOMINAL: Non-distended, obese, soft, non-tender - no rebound or guarding.  BS normal. NEUROLOGIC: Non-focal, moving all four extremities, no gross sensory or motor deficits. EXTREMITIES: No clubbing, cyanosis, or edema SKIN: Warm, Dry, No erythema, No rash  ED Course  Procedures (including critical care time) DIAGNOSTIC STUDIES: Oxygen Saturation is 97% on room air, adequate by my interpretation.    COORDINATION OF CARE: 21:12- Patient informed of clinical course, understands medical decision-making process, and agrees with plan.  Discussed admittance for further observation.    Labs Reviewed  CBC WITH DIFFERENTIAL - Abnormal; Notable for the following:    WBC 13.1 (*)     RBC 2.79 (*)     Hemoglobin 10.1 (*)     HCT 29.4 (*)     MCV 105.4 (*)     MCH 36.2 (*)     RDW 16.8 (*)     Platelets 79 (*)     Neutrophils Relative 85 (*)     Neutro Abs 11.1 (*)     Lymphocytes Relative 4 (*)     Lymphs Abs 0.5 (*)     All other components within normal limits  COMPREHENSIVE METABOLIC PANEL - Abnormal; Notable for the following:    Glucose, Bld 103 (*)     Calcium 8.3 (*)     Total Protein 5.0 (*)     Albumin 3.1 (*)     GFR calc non Af Amer 54 (*)     GFR calc Af Amer 63 (*)     All other components within normal limits  CULTURE, BLOOD (ROUTINE X 2)  CULTURE, BLOOD (ROUTINE X 2)  URINALYSIS, ROUTINE W REFLEX MICROSCOPIC  POCT LACTIC ACID (LACTATE)  URINE CULTURE   Dg Chest Port 1 View  01/17/2012  *RADIOLOGY REPORT*  Clinical Data: Fever, cough.  PORTABLE CHEST - 1 VIEW   Comparison: 11/10/2011  Findings: Cardiomediastinal contours are unchanged status post median sternotomy and CABG.  Right subclavian approach Port-A-Cath tip projects over the mid SVC.  No pneumothorax.  No pleural effusion or confluent airspace opacity.  No acute osseous finding.  IMPRESSION: Postoperative changes.  No acute process identified.   Original Report Authenticated By: Waneta Martins, M.D.      1. CLL (chronic lymphocytic leukemia)       MDM  NIEKO CLARIN is a 66 y.o. male history of chronic lymphocytic leukemia undergoing chemotherapy also has a history of hypogammaglobulinemia and has been admitted in the past for fevers while on chemotherapy. Do not have any source of fever at this time. Patient does have a mildly elevated white count however his hemodynamics are stable - do not think he is septic at this time. Discussed his case with Dr. Orvan Falconer will admit the patient for observation. Patient's blood cultures have been obtained and he was given one dose of ceftriaxone.  I personally performed the services described in this documentation, which was scribed in my presence. The recorded information has been reviewed and considered. Jones Skene, M.D.           Jones Skene, MD 01/18/12 1610

## 2012-01-18 DIAGNOSIS — D696 Thrombocytopenia, unspecified: Secondary | ICD-10-CM

## 2012-01-18 DIAGNOSIS — R509 Fever, unspecified: Secondary | ICD-10-CM

## 2012-01-18 DIAGNOSIS — C9110 Chronic lymphocytic leukemia of B-cell type not having achieved remission: Secondary | ICD-10-CM

## 2012-01-18 NOTE — ED Notes (Signed)
Discharge instructions reviewed with pt, questions answered. Pt verbalized understanding.  

## 2012-01-18 NOTE — Consult Note (Signed)
[facility] Medical Consultation  [name] ZOX:[id number] DOB: 1945/07/01  PCP: [name], MD   Requesting physician: Jones Skene M.D. 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 Received chemotherapy last Wednesday and Thursday, with Neulasta on Friday Leukocytosis probably related to recent Neulasta Thrombocytopenia, likely related to chemotherapy Multiple chronic medical problems as noted below  Recommendations: Since this gentleman looks relatively well, is not neutropenic, is not currently having a fever, and appears to be having only mild respiratory symptoms source of his possible fever, and since we currently have no hospital beds, recommend blood cultures, and discharge him home for followup with his oncologist. Other alternative would be to admit him for observation and transfer him to [facility], but outpatient observation seems more practical, since he is accompanied by her very attentive son.  Advise patient to get in touch with his oncologist as soon as possible Advise patient to return to the emergency room for any worsening of his condition or persistence of his fever.   HPI: [name] is an 66 y.o. male.   Being managed by the oncologists for a chronic lymphoblastic leukemia. She is recently started again on chemotherapy which appears to be monthly rituximab and bendamustine, followed by Neulasta. He has been told to come to the emergency room if he developed a fever, and checking his temperatures today he has noted temperatures of of 100.9, 100.3, and 100.6. He is also noted a runny nose and a sore throat.  He denies any other new focal symptoms including frequency and dysuria. Denies postnasal drip,  Rewiew of Systems:   All systems negative except as marked bold or noted in the HPI;    Constitutional: Negative for malaise, ;  Eyes: Negative for eye pain, redness and discharge. ;  ENMT: Negative for ear pain, hoarseness, , sinus pressure and sore throat. ;  Cardiovascular: Negative for chest pain, palpitations, diaphoresis, dyspnea and peripheral edema. ;  Respiratory: Negative for cough, hemoptysis, wheezing and stridor. ;  Gastrointestinal: Negative for nausea, vomiting, diarrhea, constipation, abdominal pain, melena, blood in stool, hematemesis, jaundice and rectal bleeding. unusual weight loss..   Genitourinary: Negative for frequency, dysuria, incontinence,flank pain and hematuria; Musculoskeletal: Negative for back pain and neck pain. Negative for swelling and trauma.;  Skin: . Negative for pruritus, rash, abrasions, bruising and skin lesion.; ulcerations; chronic papular lesions from his cat scratches Neuro: Negative for headache, lightheadedness and neck stiffness. Negative for weakness, altered level of consciousness , altered mental status, extremity weakness, burning feet, involuntary movement, seizure and syncope.  Psych: negative for anxiety, depression, insomnia, tearfulness, panic attacks, hallucinations, paranoia, suicidal or homicidal ideation    Past Medical History  Diagnosis Date  . Aortic stenosis   . Atrial fibrillation   . Coronary atherosclerosis of native coronary artery   . Hyperlipidemia   . Essential hypertension, benign   . BPH (benign prostatic hypertrophy)   . Elevated WBC count   . Arthritis   . Acid reflux   . Hypogammaglobulinemia   . Pre-diabetes 08/04/2011  . CLL (chronic lymphoblastic leukemia)   . Hodgkin's disease(201)   . Kidney stones     Past Surgical History  Procedure Date  . Eye surgery   . Aortic valve replacement 10/09    23mm Magna Pericardial   . Esophagogastroduodenoscopy     scrapping of throat and stretching  . Lithotripsy   . Bone  marrow aspiration   . Portacath placement   . 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 Colon surgery     Medications:  HOME MEDS: Prior to Admission medications   Medication Sig Start Date End Date Taking? Authorizing Provider  acetaminophen (TYLENOL) 325 MG tablet Take 650 mg by mouth once. Take 2 tablets (=650mg ) 1 hour prior to Rituxan.   Yes Historical Provider, MD  acyclovir (ZOVIRAX) 200 MG capsule Take 200 mg by mouth 2 (two) times daily. Take one tablet by mouth twice a day    Yes Historical Provider, MD  allopurinol (ZYLOPRIM) 300 MG tablet Take 300 mg by mouth every evening.   Yes Historical Provider, MD  aspirin EC 81 MG tablet Take 81 mg by mouth daily.   Yes Historical Provider, MD  dexamethasone (DECADRON) 4 MG tablet Starting the day before chemo, take 2 tablets in the am and 2 tablets in the pm. The morning of Rituxan take 2 tablets in the am. Then starting the day after chemo, take 2 tablets in the am x 2 days. Take with food. 10/05/11 10/04/12 Yes Randall An, MD  diphenhydrAMINE (BENADRYL) 25 MG tablet Take 50 mg by mouth. Take 2 tablets (=50mg ) 1 hour prior to Rituxan.   Yes Historical Provider, MD  folic acid (FOLVITE) 1 MG tablet Take 1 mg by mouth daily.   Yes Historical Provider, MD  LORazepam (ATIVAN) 1 MG tablet Take 1 tablet (1 mg total) by mouth every 4 (four) hours as needed (Nausea or vomiting). 10/05/11 04/02/12 Yes Randall An, MD  metoprolol (LOPRESSOR) 50 MG tablet Take 50 mg by mouth 2 (two) times daily.   Yes Historical Provider, MD  Multiple Vitamins-Minerals (CENTRUM SILVER ULTRA MENS PO) Take 1 tablet by mouth every morning.    Yes Historical Provider, MD  OMEGA 3 1000 MG CAPS Take 1,000 mg by mouth every morning.    Yes Historical Provider, MD  omeprazole (PRILOSEC) 20 MG capsule Take 20 mg by mouth every morning.    Yes Historical Provider, MD  ondansetron (ZOFRAN) 8 MG tablet Take 1 tablet two times a day starting the day after chemo for 2 days. Then take 1 tablet two times a day as needed for nausea or vomiting.  10/05/11 10/04/12 Yes Randall An, MD  RiTUXimab (RITUXAN IV) Inject into the vein every 28 (twenty-eight) days.   Yes Historical Provider, MD  rosuvastatin (CRESTOR) 40 MG tablet Take 40 mg by mouth every other day. Takes before bedtime   Yes Historical Provider, MD  sodium chloride 0.9 % SOLN 500 mL with bendamustine 100 MG SOLR Inject into the vein once. Every 28 days   Yes Historical Provider, MD  sulfamethoxazole-trimethoprim (BACTRIM DS,SEPTRA DS) 800-160 MG per tablet Take 1 tablet by mouth daily. Taking Mondays, Wednesdays and Fridays 01/07/12  Yes Ellouise Newer, PA  prochlorperazine (COMPAZINE) 10 MG tablet Take 1 tablet (10 mg total) by mouth every 6 (six) hours as needed (Nausea or vomiting). 10/05/11 10/04/12  Randall An, MD     Allergies:  No Known Allergies  Social History:   reports that he has never smoked. He has never used smokeless tobacco. He reports that he does not drink alcohol or use illicit drugs.  Family History: Family History  Problem Relation Age of Onset  . Heart failure Mother   . Blindness Mother   . Cancer Father     Lung  Physical Exam: Filed Vitals:   01/17/12 2028 01/17/12 2352  BP: 108/60   Pulse: 93   Temp: 98.8 F (37.1 C) 98.7 F (37.1 C)  TempSrc: Oral Oral  Resp: 20   Height: 5\' 10"  (1.778 m)   Weight: 104.327 kg (230 lb)   SpO2: 97%    Blood pressure 108/60, pulse 93, temperature 98.7 F (37.1 C), temperature source Oral, resp. rate 20, height 5\' 10"  (1.778 m), weight 104.327 kg (230 lb), SpO2 97.00%.  GEN:  Pleasant obese Caucasian gentleman lying in the stretcher in no acute distress; cooperative with exam PSYCH:  alert and oriented x4; does not appear anxious or depressed; affect is appropriate. HEENT: Mucous membranes pink and anicteric; PERRLA; EOM intact;  nor thyromegaly or carotid bruit; no JVD; his throat is not injected nor inflamed, there is no discharge Breasts:: Not examined CHEST WALL: No  tenderness CHEST: Normal respiration, clear to auscultation bilaterally HEART: Regular rate and rhythm; no murmurs rubs or gallops BACK: No kyphosis or scoliosis; no CVA tenderness ABDOMEN: Obese, soft non-tender; no masses, no organomegaly, normal abdominal bowel sounds; no pannus; no intertriginous candida. Rectal Exam: Not done EXTREMITIES:  age-appropriate arthropathy of the hands and knees; no edema; no ulcerations. Genitalia: not examined PULSES: 2+ and symmetric SKIN: Normal hydration no rash or ulceration other than a papular lesions from his cats jumping up on him CNS: Cranial nerves 2-12 grossly intact no focal lateralizing neurologic deficit   Labs on Admission:  Basic Metabolic Panel:  Lab 01/17/12 1610  NA 135  K 3.5  CL 98  CO2 28  GLUCOSE 103*  BUN 16  CREATININE 1.32  CALCIUM 8.3*  MG --  PHOS --   Liver Function Tests:  Lab 01/17/12 2119  AST 16  ALT 18  ALKPHOS 103  BILITOT 0.4  PROT 5.0*  ALBUMIN 3.1*   No results found for this basename: LIPASE:5,AMYLASE:5 in the last 168 hours No results found for this basename: AMMONIA:5 in the last 168 hours CBC:  Lab 01/17/12 2119  WBC 13.1*  NEUTROABS 11.1*  HGB 10.1*  HCT 29.4*  MCV 105.4*  PLT 79*   Cardiac Enzymes: No results found for this basename: CKTOTAL:5,CKMB:5,CKMBINDEX:5,TROPONINI:5 in the last 168 hours BNP: No components found with this basename: POCBNP:5 CBG: No results found for this basename: GLUCAP:5 in the last 168 hours  Results for orders placed during the hospital encounter of 01/17/12 (from the past 48 hour(s))  CULTURE, BLOOD (ROUTINE X 2)     Status: Normal (Preliminary result)   Collection Time   01/17/12  9:16 PM      Component Value Range Comment   Specimen Description Blood LEFT ARM      Special Requests BOTTLES DRAWN AEROBIC AND ANAEROBIC 8 CC EACH      Culture PENDING      Report Status PENDING     CBC WITH DIFFERENTIAL     Status: Abnormal   Collection Time    01/17/12  9:19 PM      Component Value Range Comment   WBC 13.1 (*) 4.0 - 10.5 K/uL    RBC 2.79 (*) 4.22 - 5.81 MIL/uL    Hemoglobin 10.1 (*) 13.0 - 17.0 g/dL    HCT 96.0 (*) 45.4 - 52.0 %    MCV 105.4 (*) 78.0 - 100.0 fL    MCH 36.2 (*) 26.0 - 34.0 pg    MCHC 34.4  30.0 - 36.0 g/dL    RDW 09.8 (*) 11.9 -  15.5 %    Platelets 79 (*) 150 - 400 K/uL    Neutrophils Relative 85 (*) 43 - 77 %    Neutro Abs 11.1 (*) 1.7 - 7.7 K/uL    Lymphocytes Relative 4 (*) 12 - 46 %    Lymphs Abs 0.5 (*) 0.7 - 4.0 K/uL    Monocytes Relative 7  3 - 12 %    Monocytes Absolute 0.9  0.1 - 1.0 K/uL    Eosinophils Relative 5  0 - 5 %    Eosinophils Absolute 0.6  0.0 - 0.7 K/uL    Basophils Relative 0  0 - 1 %    Basophils Absolute 0.0  0.0 - 0.1 K/uL   COMPREHENSIVE METABOLIC PANEL     Status: Abnormal   Collection Time   01/17/12  9:19 PM      Component Value Range Comment   Sodium 135  135 - 145 mEq/L    Potassium 3.5  3.5 - 5.1 mEq/L    Chloride 98  96 - 112 mEq/L    CO2 28  19 - 32 mEq/L    Glucose, Bld 103 (*) 70 - 99 mg/dL    BUN 16  6 - 23 mg/dL    Creatinine, Ser 0.86  0.50 - 1.35 mg/dL    Calcium 8.3 (*) 8.4 - 10.5 mg/dL    Total Protein 5.0 (*) 6.0 - 8.3 g/dL    Albumin 3.1 (*) 3.5 - 5.2 g/dL    AST 16  0 - 37 U/L    ALT 18  0 - 53 U/L    Alkaline Phosphatase 103  39 - 117 U/L    Total Bilirubin 0.4  0.3 - 1.2 mg/dL    GFR calc non Af Amer 54 (*) >90 mL/min    GFR calc Af Amer 63 (*) >90 mL/min   CULTURE, BLOOD (ROUTINE X 2)     Status: Normal (Preliminary result)   Collection Time   01/17/12  9:20 PM      Component Value Range Comment   Specimen Description BLOOD RIGHT ARM      Special Requests BOTTLES DRAWN AEROBIC AND ANAEROBIC 8 CC EACH      Culture PENDING      Report Status PENDING     URINALYSIS, ROUTINE W REFLEX MICROSCOPIC     Status: Normal   Collection Time   01/17/12 11:46 PM      Component Value Range Comment   Color, Urine YELLOW  YELLOW    APPearance CLEAR  CLEAR     Specific Gravity, Urine 1.025  1.005 - 1.030    pH 6.0  5.0 - 8.0    Glucose, UA NEGATIVE  NEGATIVE mg/dL    Hgb urine dipstick NEGATIVE  NEGATIVE    Bilirubin Urine NEGATIVE  NEGATIVE    Ketones, ur NEGATIVE  NEGATIVE mg/dL    Protein, ur NEGATIVE  NEGATIVE mg/dL    Urobilinogen, UA 1.0  0.0 - 1.0 mg/dL    Nitrite NEGATIVE  NEGATIVE    Leukocytes, UA NEGATIVE  NEGATIVE MICROSCOPIC NOT DONE ON URINES WITH NEGATIVE PROTEIN, BLOOD, LEUKOCYTES, NITRITE, OR GLUCOSE <1000 mg/dL.     Radiological Exams on Admission: Dg Chest Port 1 View  01/17/2012  *RADIOLOGY REPORT*  Clinical Data: Fever, cough.  PORTABLE CHEST - 1 VIEW  Comparison: 11/10/2011  Findings: Cardiomediastinal contours are unchanged status post median sternotomy and CABG.  Right subclavian approach Port-A-Cath tip projects over the mid SVC.  No  pneumothorax.  No pleural effusion or confluent airspace opacity.  No acute osseous finding.  IMPRESSION: Postoperative changes.  No acute process identified.   Original Report Authenticated By: Waneta Martins, M.D.      Code Status: FULL CODE  Family Communication: His son is present at the interview and exam, and assessment and plans were discussed with both him and his son   Time spent in interview he in evaluation and discuss patient and consulting physicians 60 mins  Mardene Lessig Nocturnist [facility] Pager [phone]   01/18/2012, 1:23 AM

## 2012-01-19 LAB — URINE CULTURE
Colony Count: NO GROWTH
Culture: NO GROWTH

## 2012-01-20 ENCOUNTER — Telehealth (HOSPITAL_COMMUNITY): Payer: Self-pay | Admitting: *Deleted

## 2012-01-20 NOTE — Telephone Encounter (Signed)
Message copied by Dennie Maizes on Thu Jan 20, 2012  9:22 AM ------      Message from: [name], ERIC S      Created: Tue Jan 18, 2012  5:59 PM       Just check on cultures in am, so far negative and let's talk in am      ----- Message -----         From: Valentina Shaggy Maxx Pham, RN         Sent: 01/18/2012  12:25 PM           To: Randall An, MD            [name] called, said he went to the ER yesterday. Said he had a fever 100.9 and took two Tylenol and went to the ER said he never had a fever in the ER. Said they were going to keep him but they didn't have any beds, told him he was to follow up with you. He said they gave him IV antibiotics and done blood cultures. His symptoms today are Temp 99 and a sore throat. Would you please look at ER note and tell me what you would like for [name] to do.

## 2012-01-20 NOTE — Telephone Encounter (Signed)
Spoke with pt's wife. Dot reports that [name] is some better. Reports that he continues to run a low grade fever at times. Informed Dot that blood cultures have tested negative so far. Instructed on neutropenic precautions. Instructed to call clinic if any further issues/concerns.

## 2012-01-22 LAB — CULTURE, BLOOD (ROUTINE X 2): Culture: NO GROWTH

## 2012-01-28 ENCOUNTER — Other Ambulatory Visit (HOSPITAL_COMMUNITY): Payer: Self-pay | Admitting: Oncology

## 2012-02-04 ENCOUNTER — Encounter (HOSPITAL_COMMUNITY): Payer: Medicare Other | Attending: Oncology | Admitting: Oncology

## 2012-02-04 ENCOUNTER — Encounter (HOSPITAL_COMMUNITY): Payer: Self-pay | Admitting: Oncology

## 2012-02-04 VITALS — BP 114/69 | HR 81 | Temp 97.4°F | Resp 20 | Wt 229.7 lb

## 2012-02-04 DIAGNOSIS — Z23 Encounter for immunization: Secondary | ICD-10-CM

## 2012-02-04 DIAGNOSIS — C9110 Chronic lymphocytic leukemia of B-cell type not having achieved remission: Secondary | ICD-10-CM | POA: Insufficient documentation

## 2012-02-04 DIAGNOSIS — D801 Nonfamilial hypogammaglobulinemia: Secondary | ICD-10-CM

## 2012-02-04 DIAGNOSIS — Z0000 Encounter for general adult medical examination without abnormal findings: Secondary | ICD-10-CM

## 2012-02-04 DIAGNOSIS — C9111 Chronic lymphocytic leukemia of B-cell type in remission: Secondary | ICD-10-CM

## 2012-02-04 DIAGNOSIS — R609 Edema, unspecified: Secondary | ICD-10-CM | POA: Insufficient documentation

## 2012-02-04 MED ORDER — INFLUENZA VIRUS VACC SPLIT PF IM SUSP
INTRAMUSCULAR | Status: AC
  Filled 2012-02-04: qty 0.5

## 2012-02-04 MED ORDER — INFLUENZA VIRUS VACC SPLIT PF IM SUSP
0.5000 mL | Freq: Once | INTRAMUSCULAR | Status: AC
  Administered 2012-02-04: 0.5 mL via INTRAMUSCULAR

## 2012-02-04 NOTE — Progress Notes (Signed)
[name] Overman, MD [address], [address] [place] [address]  1. CLL     CURRENT THERAPY:S/P 4 cycle of Bendamustine on starting on 10/13/2011 with neulasta support.  INTERVAL HISTORY: [name] 66 y.o. male returns for  regular  visit for followup of Recurrent chronic lymphocytic leukemia.   [name] is doing well.  He reported to the ED a few weeks ago with Fevers.  He was seen in the ED and no fever was documented. A complete workup was performed including blood cultures, chest x-ray, and urinalysis with urine culture. The workup was negative. The patient was released and sent home from the emergency Department.  [name] is doing well. He denies any complaints today. No B. symptomatology appreciated. He does admit to some fatigue which is stable and no worse compared to when he began chemotherapy.  [name] asked her flu shot today we can provide that today to him.   So we'll move on as scheduled with cycle 5 of chemotherapy. Following cycle 6 of chemotherapy administration, we will restage the patient with CT scans of chest abdomen and pelvis. He is agreeable with this plan.  Past Medical History  Diagnosis Date  . Aortic stenosis   . Atrial fibrillation   . Coronary atherosclerosis of native coronary artery   . Hyperlipidemia   . Essential hypertension, benign   . BPH (benign prostatic hypertrophy)   . Elevated WBC count   . Arthritis   . Acid reflux   . Hypogammaglobulinemia   . Pre-diabetes 08/04/2011  . CLL (chronic lymphoblastic leukemia)   . Hodgkin's disease(201)   . Kidney stones     has HODGKIN'S DISEASE; CLL; HYPERLIPIDEMIA; OBESITY; CORONARY ATHEROSCLEROSIS NATIVE CORONARY ARTERY; AORTIC STENOSIS; Atrial fibrillation; FEVER UNSPECIFIED; OTHER DYSPHAGIA; AORTIC VALVE REPLACEMENT, HX OF; Essential hypertension, benign; NECK PAIN, LEFT; GERD (gastroesophageal reflux disease); Thrombocytopenia; Hypogammaglobulinemia, acquired; Anemia; Tachyarrhythmia;  Pre-diabetes; UTI (lower urinary tract infection); and Cellulitis of left thigh on his problem list.      has no known allergies.  Mr. Woo had no medications administered during this visit.  Past Surgical History  Procedure Date  . Eye surgery   . Aortic valve replacement 10/09    23mm Magna Pericardial   . Esophagogastroduodenoscopy     scrapping of throat and stretching  . Lithotripsy   . Bone marrow aspiration   . Portacath placement   . 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 Colon surgery     Denies any headaches, dizziness, double vision, fevers, chills, night sweats, nausea, vomiting, diarrhea, constipation, chest pain, heart palpitations, shortness of breath, blood in stool, black tarry stool, urinary pain, urinary burning, urinary frequency, hematuria.   PHYSICAL EXAMINATION  ECOG PERFORMANCE STATUS: 2 - Symptomatic, <50% confined to bed  Filed Vitals:   02/04/12 1100  BP: 114/69  Pulse: 81  Temp: 97.4 F (36.3 C)  Resp: 20    GENERAL:alert, no distress, well nourished, well developed, comfortable, cooperative, obese, smiling and feline urine smell  SKIN: skin color, texture, turgor are normal, no rashes or significant lesions  HEAD: Normocephalic, No masses, lesions, tenderness or abnormalities  EYES: normal, Conjunctiva are pink and non-injected  EARS: External ears normal  OROPHARYNX:lips, buccal mucosa, and tongue normal and mucous membranes are moist  NECK: supple, no adenopathy, thyroid normal size, non-tender, without nodularity, no stridor, non-tender, trachea midline  LYMPH: no palpable lymphadenopathy  BREAST:not examined  LUNGS: clear to auscultation and percussion  HEART:  regular rate & rhythm, no murmurs, no gallops, S1 normal and S2 normal  ABDOMEN:abdomen soft, non-tender, obese and normal bowel sounds  BACK: Back symmetric, no curvature., No CVA tenderness  EXTREMITIES:less then 2 second capillary refill, no joint deformities,  effusion, or inflammation, no edema, no skin discoloration, no clubbing, no cyanosis, multiple ecchymoses  NEURO: alert & oriented x 3 with fluent speech, no focal motor/sensory deficits, gait normal, slow speech   LABORATORY DATA: CBC    Component Value Date/Time   WBC 13.1* 01/17/2012 2119   RBC 2.79* 01/17/2012 2119   HGB 10.1* 01/17/2012 2119   HCT 29.4* 01/17/2012 2119   PLT 79* 01/17/2012 2119   MCV 105.4* 01/17/2012 2119   MCH 36.2* 01/17/2012 2119   MCHC 34.4 01/17/2012 2119   RDW 16.8* 01/17/2012 2119   LYMPHSABS 0.5* 01/17/2012 2119   MONOABS 0.9 01/17/2012 2119   EOSABS 0.6 01/17/2012 2119   BASOSABS 0.0 01/17/2012 2119      Chemistry      Component Value Date/Time   NA 135 01/17/2012 2119   K 3.5 01/17/2012 2119   CL 98 01/17/2012 2119   CO2 28 01/17/2012 2119   BUN 16 01/17/2012 2119   CREATININE 1.32 01/17/2012 2119   CREATININE 1.29 05/10/2011 1541      Component Value Date/Time   CALCIUM 8.3* 01/17/2012 2119   ALKPHOS 103 01/17/2012 2119   AST 16 01/17/2012 2119   ALT 18 01/17/2012 2119   BILITOT 0.4 01/17/2012 2119        ASSESSMENT:  1. Recurrent chronic lymphocytic leukemia. Presently undergoing Bendamustine chemotherapy. S/P 3 cycle.  2. 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 He is in a complete remission from the Hodgkin's.  3. Status post fludarabine, Cytoxan, and rituximab therapy for 3 cycles when he presented with CLL in 2004.  4. Obesity.  5. Insect bites, multiple places on his body.  6. Possible early onset diabetes mellitus with mild elevation in his sugars lately. We will check hemoglobin A1c next time he is here.  7. Pan-hypogammaglobulinemia.  8. Severe deconditioning.  9. History of Agent Orange exposure during the [place] War and he  states that just within the last 2 months he has been granted full disability from the [place].  10.Coronary artery disease, status post bypass grafting x1 in October 2009.  11.Peripheral vascular disease.  12.Aortic stenosis with aortic valve replacement.  13.Benign prostatic hypertrophy.  14.Hyperlipidemia.  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  16.Circumcision in the past, leaving him with difficulty with a uniform stream and occasional incontinence.    PLAN:  1. I personally reviewed and went over laboratory results with the patient. 2. Cycle 5 as scheduled on 02/09/2012 3. Pre-chemotherapy lab work: CBC diff, CMET, LDH, ESR 4. CT CAP with contrast 14-21 days after cycle 6 of chemotherapy.  5. Flu shot today 6. Return in 1 month for follow-up before cycle 6 of chemotherapy.    All questions were answered. The patient knows to call the clinic with any problems, questions or concerns. We can certainly see the patient much sooner if necessary.  The patient and plan discussed with Glenford Peers, MD and he is in agreement with the aforementioned.   KEFALAS,THOMAS

## 2012-02-04 NOTE — Progress Notes (Signed)
Flu shot administered to right arm; see MAR for details.

## 2012-02-04 NOTE — Patient Instructions (Signed)
[facility]  Discharge Instructions  RECOMMENDATIONS MADE BY THE CONSULTANT AND ANY TEST RESULTS WILL BE SENT TO YOUR REFERRING DOCTOR.   EXAM FINDINGS BY MD TODAY AND SIGNS AND SYMPTOMS TO REPORT TO CLINIC OR PRIMARY MD: Exam findings as discussed with T. Kefalas, PA-C.  SPECIAL INSTRUCTIONS/FOLLOW-UP: 1.  We have you scheduled for pre-chemo labs and chemotherapy for next week. 2.  You will have a CT before Cycle 6 and will be scheduled to see Dr. [name] again at that time.  Please contact us sooner with any questions/concerns.  I acknowledge that I have been informed and understand all the instructions given to me and received a copy. I do not have any more questions at this time, but understand that I may call the Specialty Clinic at [facility] at [phone] during business hours should I have any further questions or need assistance in obtaining follow-up care.    __________________________________________  _____________  __________ Signature of Patient or Authorized Representative            Date                   Time    __________________________________________ Nurse's Signature

## 2012-02-08 ENCOUNTER — Encounter (HOSPITAL_BASED_OUTPATIENT_CLINIC_OR_DEPARTMENT_OTHER): Payer: Medicare Other

## 2012-02-08 DIAGNOSIS — C9110 Chronic lymphocytic leukemia of B-cell type not having achieved remission: Secondary | ICD-10-CM

## 2012-02-08 LAB — COMPREHENSIVE METABOLIC PANEL
ALT: 21 U/L (ref 0–53)
AST: 26 U/L (ref 0–37)
Albumin: 3.4 g/dL — ABNORMAL LOW (ref 3.5–5.2)
Alkaline Phosphatase: 94 U/L (ref 39–117)
Calcium: 9.1 mg/dL (ref 8.4–10.5)
GFR calc Af Amer: 77 mL/min — ABNORMAL LOW (ref >90)
Glucose, Bld: 105 mg/dL — ABNORMAL HIGH (ref 70–99)
Potassium: 3.6 mEq/L (ref 3.5–5.1)
Sodium: 138 mEq/L (ref 135–145)
Total Protein: 5.9 g/dL — ABNORMAL LOW (ref 6.0–8.3)

## 2012-02-08 LAB — CBC WITH DIFFERENTIAL/PLATELET
Basophils Absolute: 0 10*3/uL (ref 0.0–0.1)
Eosinophils Absolute: 0.4 10*3/uL (ref 0.0–0.7)
Eosinophils Relative: 11 % — ABNORMAL HIGH (ref 0–5)
Lymphs Abs: 1.3 10*3/uL (ref 0.7–4.0)
MCH: 34.4 pg — ABNORMAL HIGH (ref 26.0–34.0)
MCV: 101.9 fL — ABNORMAL HIGH (ref 78.0–100.0)
Neutrophils Relative %: 41 % — ABNORMAL LOW (ref 43–77)
Platelets: 89 10*3/uL — ABNORMAL LOW (ref 150–400)
RBC: 3.08 MIL/uL — ABNORMAL LOW (ref 4.22–5.81)
RDW: 16.7 % — ABNORMAL HIGH (ref 11.5–15.5)
WBC: 3.3 10*3/uL — ABNORMAL LOW (ref 4.0–10.5)

## 2012-02-08 NOTE — Progress Notes (Signed)
Labs drawn today for cbc/diff,cmp,ldh,sed rate 

## 2012-02-09 ENCOUNTER — Encounter (HOSPITAL_BASED_OUTPATIENT_CLINIC_OR_DEPARTMENT_OTHER): Payer: Medicare Other

## 2012-02-09 DIAGNOSIS — C9110 Chronic lymphocytic leukemia of B-cell type not having achieved remission: Secondary | ICD-10-CM

## 2012-02-09 MED ORDER — FOLIC ACID 1 MG PO TABS: 1.0000 mg | ORAL_TABLET | Freq: Every day | ORAL | Status: DC

## 2012-02-09 NOTE — Progress Notes (Signed)
Chemo deferred  X 1 week, treatment parameters not met.

## 2012-02-10 ENCOUNTER — Inpatient Hospital Stay (HOSPITAL_COMMUNITY): Payer: Medicare Other

## 2012-02-11 ENCOUNTER — Ambulatory Visit (HOSPITAL_COMMUNITY): Payer: Medicare Other

## 2012-02-15 ENCOUNTER — Encounter (HOSPITAL_BASED_OUTPATIENT_CLINIC_OR_DEPARTMENT_OTHER): Payer: Medicare Other

## 2012-02-15 ENCOUNTER — Telehealth (HOSPITAL_COMMUNITY): Payer: Self-pay

## 2012-02-15 DIAGNOSIS — C9110 Chronic lymphocytic leukemia of B-cell type not having achieved remission: Secondary | ICD-10-CM

## 2012-02-15 LAB — DIFFERENTIAL
Basophils Absolute: 0 10*3/uL (ref 0.0–0.1)
Eosinophils Relative: 7 % — ABNORMAL HIGH (ref 0–5)
Lymphocytes Relative: 31 % (ref 12–46)
Lymphs Abs: 1.2 10*3/uL (ref 0.7–4.0)
Neutro Abs: 2.3 10*3/uL (ref 1.7–7.7)
Neutrophils Relative %: 57 % (ref 43–77)

## 2012-02-15 LAB — COMPREHENSIVE METABOLIC PANEL
ALT: 20 U/L (ref 0–53)
Calcium: 9.1 mg/dL (ref 8.4–10.5)
Creatinine, Ser: 1.05 mg/dL (ref 0.50–1.35)
GFR calc Af Amer: 83 mL/min — ABNORMAL LOW (ref >90)
GFR calc non Af Amer: 71 mL/min — ABNORMAL LOW (ref >90)
Glucose, Bld: 120 mg/dL — ABNORMAL HIGH (ref 70–99)
Sodium: 140 mEq/L (ref 135–145)
Total Protein: 5.5 g/dL — ABNORMAL LOW (ref 6.0–8.3)

## 2012-02-15 LAB — CBC
Hemoglobin: 10.6 g/dL — ABNORMAL LOW (ref 13.0–17.0)
MCH: 34.4 pg — ABNORMAL HIGH (ref 26.0–34.0)
MCHC: 33.5 g/dL (ref 30.0–36.0)
MCV: 102.6 fL — ABNORMAL HIGH (ref 78.0–100.0)

## 2012-02-15 LAB — SEDIMENTATION RATE: Sed Rate: 22 mm/hr — ABNORMAL HIGH (ref 0–16)

## 2012-02-15 LAB — LACTATE DEHYDROGENASE: LDH: 286 U/L — ABNORMAL HIGH (ref 94–250)

## 2012-02-15 LAB — MAGNESIUM: Magnesium: 1.9 mg/dL (ref 1.5–2.5)

## 2012-02-15 NOTE — Progress Notes (Signed)
Labs drawn today for cbc/diff,cmp,ldh,mg,sed rate

## 2012-02-15 NOTE — Telephone Encounter (Signed)
Message copied by Evelena Leyden on Tue Feb 15, 2012  5:10 PM ------      Message from: Ellouise Newer      Created: Tue Feb 15, 2012  4:50 PM       Treat

## 2012-02-15 NOTE — Telephone Encounter (Signed)
Spoke with wife and she will let [name] know that he is to receive chemo as planned.

## 2012-02-16 ENCOUNTER — Encounter (HOSPITAL_BASED_OUTPATIENT_CLINIC_OR_DEPARTMENT_OTHER): Payer: Medicare Other

## 2012-02-16 VITALS — BP 124/70 | HR 76 | Temp 97.4°F | Resp 18

## 2012-02-16 DIAGNOSIS — Z5112 Encounter for antineoplastic immunotherapy: Secondary | ICD-10-CM

## 2012-02-16 DIAGNOSIS — C9110 Chronic lymphocytic leukemia of B-cell type not having achieved remission: Secondary | ICD-10-CM

## 2012-02-16 MED ORDER — HEPARIN SOD (PORK) LOCK FLUSH 100 UNIT/ML IV SOLN
INTRAVENOUS | Status: AC
  Filled 2012-02-16: qty 5

## 2012-02-16 MED ORDER — SODIUM CHLORIDE 0.9 % IV SOLN
Freq: Once | INTRAVENOUS | Status: AC
  Administered 2012-02-16: 8 mg via INTRAVENOUS
  Filled 2012-02-16: qty 4

## 2012-02-16 MED ORDER — SODIUM CHLORIDE 0.9 % IV SOLN
375.0000 mg/m2 | Freq: Once | INTRAVENOUS | Status: AC
  Administered 2012-02-16: 900 mg via INTRAVENOUS
  Filled 2012-02-16: qty 90

## 2012-02-16 MED ORDER — HEPARIN SOD (PORK) LOCK FLUSH 100 UNIT/ML IV SOLN
500.0000 [IU] | Freq: Once | INTRAVENOUS | Status: AC | PRN
  Administered 2012-02-16: 500 [IU]
  Filled 2012-02-16: qty 5

## 2012-02-16 MED ORDER — SODIUM CHLORIDE 0.9 % IV SOLN
Freq: Once | INTRAVENOUS | Status: AC
  Administered 2012-02-16: 11:00:00 via INTRAVENOUS

## 2012-02-16 MED ORDER — SODIUM CHLORIDE 0.9 % IV SOLN
66.6667 mg/m2 | Freq: Once | INTRAVENOUS | Status: AC
  Administered 2012-02-16: 155 mg via INTRAVENOUS
  Filled 2012-02-16: qty 31

## 2012-02-16 MED ORDER — SODIUM CHLORIDE 0.9 % IJ SOLN
10.0000 mL | INTRAMUSCULAR | Status: DC | PRN
  Administered 2012-02-16: 10 mL
  Filled 2012-02-16: qty 10

## 2012-02-16 NOTE — Progress Notes (Signed)
Tolerated chemo well. 

## 2012-02-17 ENCOUNTER — Encounter (HOSPITAL_BASED_OUTPATIENT_CLINIC_OR_DEPARTMENT_OTHER): Payer: Medicare Other

## 2012-02-17 VITALS — BP 142/82 | HR 73 | Temp 97.5°F | Resp 18 | Wt 236.6 lb

## 2012-02-17 DIAGNOSIS — Z5111 Encounter for antineoplastic chemotherapy: Secondary | ICD-10-CM

## 2012-02-17 DIAGNOSIS — C9110 Chronic lymphocytic leukemia of B-cell type not having achieved remission: Secondary | ICD-10-CM

## 2012-02-17 DIAGNOSIS — R700 Elevated erythrocyte sedimentation rate: Secondary | ICD-10-CM

## 2012-02-17 DIAGNOSIS — R609 Edema, unspecified: Secondary | ICD-10-CM

## 2012-02-17 MED ORDER — SODIUM CHLORIDE 0.9 % IJ SOLN
INTRAMUSCULAR | Status: AC
  Filled 2012-02-17: qty 10

## 2012-02-17 MED ORDER — SODIUM CHLORIDE 0.9 % IV SOLN
Freq: Once | INTRAVENOUS | Status: AC
  Administered 2012-02-17: 10:00:00 via INTRAVENOUS

## 2012-02-17 MED ORDER — SODIUM CHLORIDE 0.9 % IV SOLN
66.6667 mg/m2 | Freq: Once | INTRAVENOUS | Status: AC
  Administered 2012-02-17: 155 mg via INTRAVENOUS
  Filled 2012-02-17: qty 31

## 2012-02-17 MED ORDER — FUROSEMIDE 10 MG/ML IJ SOLN
INTRAMUSCULAR | Status: AC
  Filled 2012-02-17: qty 2

## 2012-02-17 MED ORDER — FUROSEMIDE 10 MG/ML IJ SOLN
20.0000 mg | Freq: Once | INTRAMUSCULAR | Status: AC
  Administered 2012-02-17: 20 mg via INTRAVENOUS

## 2012-02-17 MED ORDER — HEPARIN SOD (PORK) LOCK FLUSH 100 UNIT/ML IV SOLN
INTRAVENOUS | Status: AC
  Filled 2012-02-17: qty 5

## 2012-02-17 MED ORDER — ONDANSETRON HCL 40 MG/20ML IJ SOLN
Freq: Once | INTRAMUSCULAR | Status: AC
  Administered 2012-02-17: 8 mg via INTRAVENOUS
  Filled 2012-02-17: qty 4

## 2012-02-17 MED ORDER — SODIUM CHLORIDE 0.9 % IJ SOLN
10.0000 mL | INTRAMUSCULAR | Status: DC | PRN
  Administered 2012-02-17: 10 mL
  Filled 2012-02-17: qty 10

## 2012-02-17 MED ORDER — HEPARIN SOD (PORK) LOCK FLUSH 100 UNIT/ML IV SOLN
500.0000 [IU] | Freq: Once | INTRAVENOUS | Status: AC | PRN
  Administered 2012-02-17: 500 [IU]
  Filled 2012-02-17: qty 5

## 2012-02-18 ENCOUNTER — Encounter (HOSPITAL_BASED_OUTPATIENT_CLINIC_OR_DEPARTMENT_OTHER): Payer: Medicare Other

## 2012-02-18 DIAGNOSIS — C9110 Chronic lymphocytic leukemia of B-cell type not having achieved remission: Secondary | ICD-10-CM

## 2012-02-18 MED ORDER — PEGFILGRASTIM INJECTION 6 MG/0.6ML
SUBCUTANEOUS | Status: AC
  Filled 2012-02-18: qty 0.6

## 2012-02-18 MED ORDER — PEGFILGRASTIM INJECTION 6 MG/0.6ML
6.0000 mg | Freq: Once | SUBCUTANEOUS | Status: AC
  Administered 2012-02-18: 6 mg via SUBCUTANEOUS

## 2012-02-18 NOTE — Progress Notes (Signed)
[name] presents today for injection per MD orders. Neulasta 6mg administered SQ in left Abdomen. Administration without incident. Patient tolerated well.  

## 2012-02-23 ENCOUNTER — Telehealth (HOSPITAL_COMMUNITY): Payer: Self-pay | Admitting: Oncology

## 2012-02-23 ENCOUNTER — Other Ambulatory Visit (HOSPITAL_COMMUNITY): Payer: Self-pay | Admitting: Oncology

## 2012-02-23 DIAGNOSIS — C9110 Chronic lymphocytic leukemia of B-cell type not having achieved remission: Secondary | ICD-10-CM

## 2012-02-23 MED ORDER — SULFAMETHOXAZOLE-TRIMETHOPRIM 800-160 MG PO TABS: 1.0000 | ORAL_TABLET | Freq: Every day | ORAL | Status: DC

## 2012-02-23 NOTE — Telephone Encounter (Signed)
Yes.  Continue Bactrim.  Will send in a new Rx

## 2012-02-27 DIAGNOSIS — I2510 Atherosclerotic heart disease of native coronary artery without angina pectoris: Secondary | ICD-10-CM | POA: Insufficient documentation

## 2012-02-27 DIAGNOSIS — I4891 Unspecified atrial fibrillation: Secondary | ICD-10-CM | POA: Insufficient documentation

## 2012-02-27 DIAGNOSIS — D709 Neutropenia, unspecified: Secondary | ICD-10-CM | POA: Insufficient documentation

## 2012-02-27 DIAGNOSIS — Z7982 Long term (current) use of aspirin: Secondary | ICD-10-CM | POA: Insufficient documentation

## 2012-02-27 DIAGNOSIS — IMO0002 Reserved for concepts with insufficient information to code with codable children: Secondary | ICD-10-CM | POA: Insufficient documentation

## 2012-02-27 DIAGNOSIS — I10 Essential (primary) hypertension: Secondary | ICD-10-CM | POA: Insufficient documentation

## 2012-02-27 DIAGNOSIS — D801 Nonfamilial hypogammaglobulinemia: Secondary | ICD-10-CM | POA: Insufficient documentation

## 2012-02-27 DIAGNOSIS — Q251 Coarctation of aorta: Secondary | ICD-10-CM | POA: Insufficient documentation

## 2012-02-27 DIAGNOSIS — E785 Hyperlipidemia, unspecified: Secondary | ICD-10-CM | POA: Insufficient documentation

## 2012-02-27 DIAGNOSIS — Z87442 Personal history of urinary calculi: Secondary | ICD-10-CM | POA: Insufficient documentation

## 2012-02-27 DIAGNOSIS — Z791 Long term (current) use of non-steroidal anti-inflammatories (NSAID): Secondary | ICD-10-CM | POA: Insufficient documentation

## 2012-02-27 DIAGNOSIS — Z8739 Personal history of other diseases of the musculoskeletal system and connective tissue: Secondary | ICD-10-CM | POA: Insufficient documentation

## 2012-02-27 DIAGNOSIS — C9110 Chronic lymphocytic leukemia of B-cell type not having achieved remission: Secondary | ICD-10-CM | POA: Insufficient documentation

## 2012-02-27 DIAGNOSIS — K219 Gastro-esophageal reflux disease without esophagitis: Secondary | ICD-10-CM | POA: Insufficient documentation

## 2012-02-27 DIAGNOSIS — Z79899 Other long term (current) drug therapy: Secondary | ICD-10-CM | POA: Insufficient documentation

## 2012-02-27 DIAGNOSIS — C8190 Hodgkin lymphoma, unspecified, unspecified site: Secondary | ICD-10-CM | POA: Insufficient documentation

## 2012-02-27 NOTE — ED Notes (Addendum)
Pt c/o fever, is cancer pt. Pt states earlier around 10:45-11 pm his temp was 101.6 and he took 650 mg of tylenol.

## 2012-02-28 ENCOUNTER — Encounter (HOSPITAL_COMMUNITY): Payer: Self-pay | Admitting: *Deleted

## 2012-02-28 ENCOUNTER — Emergency Department (HOSPITAL_COMMUNITY): Payer: Medicare Other

## 2012-02-28 ENCOUNTER — Emergency Department (HOSPITAL_COMMUNITY)
Admission: EM | Admit: 2012-02-28 | Discharge: 2012-02-28 | Disposition: A | Discharge status: Home / Self Care | Payer: Medicare Other | Attending: Emergency Medicine | Admitting: Emergency Medicine

## 2012-02-28 ENCOUNTER — Telehealth (HOSPITAL_COMMUNITY): Payer: Self-pay | Admitting: Oncology

## 2012-02-28 DIAGNOSIS — R509 Fever, unspecified: Secondary | ICD-10-CM

## 2012-02-28 DIAGNOSIS — C8190 Hodgkin lymphoma, unspecified, unspecified site: Secondary | ICD-10-CM

## 2012-02-28 DIAGNOSIS — D709 Neutropenia, unspecified: Secondary | ICD-10-CM

## 2012-02-28 LAB — BASIC METABOLIC PANEL
Calcium: 8.4 mg/dL (ref 8.4–10.5)
GFR calc Af Amer: >90 mL/min (ref >90)
GFR calc non Af Amer: 84 mL/min — ABNORMAL LOW (ref >90)
Glucose, Bld: 119 mg/dL — ABNORMAL HIGH (ref 70–99)
Sodium: 136 mEq/L (ref 135–145)

## 2012-02-28 LAB — CBC WITH DIFFERENTIAL/PLATELET
Band Neutrophils: 0 % (ref 0–10)
Basophils Absolute: 0 10*3/uL (ref 0.0–0.1)
Basophils Relative: 2 % — ABNORMAL HIGH (ref 0–1)
Blasts: 0 %
Eosinophils Absolute: 0 10*3/uL (ref 0.0–0.7)
Eosinophils Relative: 0 % (ref 0–5)
HCT: 26.6 % — ABNORMAL LOW (ref 39.0–52.0)
Hemoglobin: 9.2 g/dL — ABNORMAL LOW (ref 13.0–17.0)
Lymphocytes Relative: 34 % (ref 12–46)
Lymphs Abs: 0.3 10*3/uL — ABNORMAL LOW (ref 0.7–4.0)
MCV: 100 fL (ref 78.0–100.0)
Monocytes Absolute: 0 10*3/uL — ABNORMAL LOW (ref 0.1–1.0)
Monocytes Relative: 4 % (ref 3–12)
RBC: 2.66 MIL/uL — ABNORMAL LOW (ref 4.22–5.81)
WBC: 0.9 10*3/uL — CL (ref 4.0–10.5)

## 2012-02-28 LAB — URINALYSIS, ROUTINE W REFLEX MICROSCOPIC
Bilirubin Urine: NEGATIVE
Hgb urine dipstick: NEGATIVE
Nitrite: NEGATIVE
Protein, ur: NEGATIVE mg/dL
Urobilinogen, UA: 0.2 mg/dL (ref 0.0–1.0)

## 2012-02-28 MED ORDER — SODIUM CHLORIDE 0.9 % IV SOLN
500.0000 mg | Freq: Once | INTRAVENOUS | Status: AC
  Administered 2012-02-28: 500 mg via INTRAVENOUS
  Filled 2012-02-28: qty 500

## 2012-02-28 MED ORDER — CIPROFLOXACIN HCL 500 MG PO TABS: 500.0000 mg | ORAL_TABLET | Freq: Two times a day (BID) | ORAL | Status: DC

## 2012-02-28 NOTE — ED Notes (Signed)
Pt alert & oriented x4, stable gait. Patient given discharge instructions, paperwork & prescription(s). Patient  instructed to stop at the registration desk to finish any additional paperwork. Patient verbalized understanding. Pt left department w/ no further questions. 

## 2012-02-28 NOTE — Telephone Encounter (Signed)
[name] called stating he has experienced mild fevers intermittently x 3-4 days, fatigue, sweats - reports taking Tylenol w/ reduction of fevers.  Self-reported highest temp. Was 101.6.  [name] states he went to the ER overnight.  [name] Bruin, PA-C reviewed ER course - advised [name] to take his Septra as prescribed, in addition to the Cipro rx given by the EDP; needs to return to the ER should fevers return or experiences severe pain, n/v/d, or ShOB.  [name] verbalized understanding of instruction and will see T. Kefalas, PA-C as scheduled in 1 weeks w/ labs just prior to that.

## 2012-02-28 NOTE — ED Provider Notes (Signed)
History     CSN: [id number]  Arrival date & time 02/27/12  2352   First MD Initiated Contact with Patient 02/28/12 0016      Chief Complaint  Patient presents with  . Fever    (Consider location/radiation/quality/duration/timing/severity/associated sxs/prior treatment) HPI Comments: [name] is a 66 y.o. Male who has had a fever tonight. It was a 101.6. He took Tylenol for it. He has felt somewhat achy, the last few days, and warm, but had no documented temperature elevation. He denies cough, shortness of breath, chest pain, change in bowel or urinary habits. There's been no nausea, vomiting, weakness, or dizziness. He had chemotherapy 10 days ago. There are no modifying factors.  The history is provided by the patient.    Past Medical History  Diagnosis Date  . Aortic stenosis   . Atrial fibrillation   . Coronary atherosclerosis of native coronary artery   . Hyperlipidemia   . Essential hypertension, benign   . BPH (benign prostatic hypertrophy)   . Elevated WBC count   . Arthritis   . Acid reflux   . Hypogammaglobulinemia   . Pre-diabetes 08/04/2011  . CLL (chronic lymphoblastic leukemia)   . Hodgkin's disease(201)   . Kidney stones     Past Surgical History  Procedure Date  . Eye surgery   . Aortic valve replacement 10/09    23mm Magna Pericardial   . Esophagogastroduodenoscopy     scrapping of throat and stretching  . Lithotripsy   . Bone marrow aspiration   . Portacath placement   . 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 Colon surgery     Family History  Problem Relation Age of Onset  . Heart failure Mother   . Blindness Mother   . Cancer Father     Lung     History  Substance Use Topics  . Smoking status: Never Smoker   . Smokeless tobacco: Never Used     Comment: pt denies tobacco use   . Alcohol Use: No     Comment: pt denies alcohol       Review of Systems  All other systems reviewed and are  negative.    Allergies  Review of patient's allergies indicates no known allergies.  Home Medications   Current Outpatient Rx  Name  Route  Sig  Dispense  Refill  . ACETAMINOPHEN 325 MG PO TABS   Oral   Take 650 mg by mouth once. Take 2 tablets (=650mg ) 1 hour prior to Rituxan.         Marland Kitchen ACYCLOVIR 200 MG PO CAPS   Oral   Take 200 mg by mouth 2 (two) times daily. Take one tablet by mouth twice a day          . ALLOPURINOL 300 MG PO TABS   Oral   Take 300 mg by mouth every evening.         . ASPIRIN EC 81 MG PO TBEC   Oral   Take 81 mg by mouth daily.         Marland Kitchen DEXAMETHASONE 4 MG PO TABS      Starting the day before chemo, take 2 tablets in the am and 2 tablets in the pm. The morning of Rituxan take 2 tablets in the am. Then starting the day after chemo, take 2 tablets in the am x 2 days. Take with food.         Marland Kitchen  DIPHENHYDRAMINE HCL 25 MG PO TABS   Oral   Take 50 mg by mouth. Take 2 tablets (=50mg ) 1 hour prior to Rituxan.         Marland Kitchen FOLIC ACID 1 MG PO TABS   Oral   Take 1 tablet (1 mg total) by mouth daily.   90 tablet   0   . LORAZEPAM 1 MG PO TABS   Oral   Take 1 tablet (1 mg total) by mouth every 4 (four) hours as needed (Nausea or vomiting).   30 tablet   1   . METOPROLOL TARTRATE 50 MG PO TABS   Oral   Take 50 mg by mouth 2 (two) times daily.         . CENTRUM SILVER ULTRA MENS PO   Oral   Take 1 tablet by mouth every morning.          Marland Kitchen OMEGA 3 1000 MG PO CAPS   Oral   Take 1,000 mg by mouth every morning.          Marland Kitchen OMEPRAZOLE 20 MG PO CPDR   Oral   Take 20 mg by mouth every morning.          Marland Kitchen ONDANSETRON HCL 8 MG PO TABS      Take 1 tablet two times a day starting the day after chemo for 2 days. Then take 1 tablet two times a day as needed for nausea or vomiting.   30 tablet   1   . PROCHLORPERAZINE MALEATE 10 MG PO TABS   Oral   Take 1 tablet (10 mg total) by mouth every 6 (six) hours as needed (Nausea or vomiting).    30 tablet   1   . RITUXAN IV   Intravenous   Inject into the vein every 28 (twenty-eight) days.         Marland Kitchen ROSUVASTATIN CALCIUM 40 MG PO TABS   Oral   Take 40 mg by mouth every other day. Takes before bedtime         . SULFAMETHOXAZOLE-TRIMETHOPRIM 800-160 MG PO TABS   Oral   Take 1 tablet by mouth daily. Taking Mondays, Wednesdays and Fridays   30 tablet   1   . CIPROFLOXACIN HCL 500 MG PO TABS   Oral   Take 1 tablet (500 mg total) by mouth every 12 (twelve) hours.   14 tablet   0   . BENDAMUSTINE CHEMO IV INFUSION   Intravenous   Inject into the vein once. Every 28 days           BP 105/60  Pulse 96  Temp 99.2 F (37.3 C)  Resp 20  Wt 232 lb (105.235 kg)  SpO2 99%  Physical Exam  Nursing note and vitals reviewed. Constitutional: He is oriented to person, place, and time. He appears well-developed and well-nourished.  HENT:  Head: Normocephalic and atraumatic.  Right Ear: External ear normal.  Left Ear: External ear normal.  Eyes: Conjunctivae normal and EOM are normal. Pupils are equal, round, and reactive to light.  Neck: Normal range of motion and phonation normal. Neck supple.  Cardiovascular: Normal rate, regular rhythm, normal heart sounds and intact distal pulses.   Pulmonary/Chest: Effort normal and breath sounds normal. He exhibits no bony tenderness.  Abdominal: Soft. Normal appearance. There is no tenderness.  Musculoskeletal: Normal range of motion.  Neurological: He is alert and oriented to person, place, and time. He has normal strength. No cranial nerve  deficit or sensory deficit. He exhibits normal muscle tone. Coordination normal.  Skin: Skin is warm, dry and intact.  Psychiatric: He has a normal mood and affect. His behavior is normal. Judgment and thought content normal.    ED Course  Procedures (including critical care time)  Emergency department treatment: IV fluids, IV Primaxin  Reevaluation: 04:00- he is alert, and comfortable.  Vital signs are reassuring, and normal. He does not have a elevated temperature on any of the checks here in the emergency department.  Case was discussed with Dr. Vania Rea; the on-call triad the hospitalist. We both feel that the patient is nontoxic and stable for home treatment.  Labs Reviewed  CBC WITH DIFFERENTIAL - Abnormal; Notable for the following:    WBC 0.9 (*)     RBC 2.66 (*)     Hemoglobin 9.2 (*)     HCT 26.6 (*)     MCH 34.6 (*)     RDW 16.3 (*)     Platelets 61 (*)     Basophils Relative 2 (*)     Neutro Abs 0.6 (*)     Lymphs Abs 0.3 (*)     Monocytes Absolute 0.0 (*)     All other components within normal limits  BASIC METABOLIC PANEL - Abnormal; Notable for the following:    Potassium 3.3 (*)     Glucose, Bld 119 (*)     GFR calc non Af Amer 84 (*)     All other components within normal limits  URINALYSIS, ROUTINE W REFLEX MICROSCOPIC  LACTIC ACID, PLASMA  URINE CULTURE  CULTURE, BLOOD (ROUTINE X 2)  CULTURE, BLOOD (ROUTINE X 2)   Dg Chest 2 View  02/28/2012  *RADIOLOGY REPORT*  Clinical Data: Fever.  Malaise.  Diarrhea.  CHEST - 2 VIEW  Comparison: 01/17/2012  Findings: Heart size is normal.  Both lungs are clear.  No evidence of pleural effusion.  No mass or lymphadenopathy identified.  Right-sided Port-A-Cath remains in appropriate position.  The patient has undergone previous CABG and aortic valve replacement.  IMPRESSION: Stable exam.  No active disease.   Original Report Authenticated By: Myles Rosenthal, M.D.    Nursing notes, applicable records and vitals reviewed.  Radiologic Images/Reports reviewed.   1. Neutropenia   2. Fever       MDM  Neutropenic fever without source for infection. Patient needs treatment, or occult infection. He is nontoxic, stable, and can be treated as an outpatient with close followup. He has been pan cultured. He has had initiation of antibiotic treatment in the emergency department.      Plan: Home  Medications- usual, plus Cipro; Home Treatments- Rest, fluids; Recommended follow up- Call Oncology this morning to arrange a follow up appointent            Flint Melter, MD 02/28/12 [phone]

## 2012-02-28 NOTE — ED Notes (Signed)
Pt states he has been feeling a little achy for the past few days. Has been running a low grade temp that does respond to medications. Pt denies any nausea or vomiting. Pt has been having some diarrhea that started w/ the chemo treatment.

## 2012-02-29 LAB — URINE CULTURE: Culture: NO GROWTH

## 2012-03-04 LAB — CULTURE, BLOOD (ROUTINE X 2): Culture: NO GROWTH

## 2012-03-06 ENCOUNTER — Encounter (HOSPITAL_COMMUNITY): Payer: Self-pay | Admitting: Oncology

## 2012-03-06 ENCOUNTER — Encounter (HOSPITAL_BASED_OUTPATIENT_CLINIC_OR_DEPARTMENT_OTHER): Payer: Medicare Other

## 2012-03-06 ENCOUNTER — Encounter (HOSPITAL_COMMUNITY): Payer: Medicare Other | Attending: Oncology | Admitting: Oncology

## 2012-03-06 ENCOUNTER — Other Ambulatory Visit (HOSPITAL_COMMUNITY): Payer: Self-pay

## 2012-03-06 VITALS — BP 103/70 | HR 90 | Temp 97.6°F | Resp 18 | Wt 224.2 lb

## 2012-03-06 DIAGNOSIS — C8190 Hodgkin lymphoma, unspecified, unspecified site: Secondary | ICD-10-CM

## 2012-03-06 DIAGNOSIS — C9110 Chronic lymphocytic leukemia of B-cell type not having achieved remission: Secondary | ICD-10-CM

## 2012-03-06 DIAGNOSIS — R509 Fever, unspecified: Secondary | ICD-10-CM | POA: Insufficient documentation

## 2012-03-06 DIAGNOSIS — Z8571 Personal history of Hodgkin lymphoma: Secondary | ICD-10-CM

## 2012-03-06 LAB — CBC WITH DIFFERENTIAL/PLATELET
Eosinophils Relative: 0 % (ref 0–5)
HCT: 34.9 % — ABNORMAL LOW (ref 39.0–52.0)
Lymphocytes Relative: 58 % — ABNORMAL HIGH (ref 12–46)
Lymphs Abs: 1.2 10*3/uL (ref 0.7–4.0)
MCV: 100 fL (ref 78.0–100.0)
Monocytes Absolute: 0.3 10*3/uL (ref 0.1–1.0)
Monocytes Relative: 16 % — ABNORMAL HIGH (ref 3–12)
RBC: 3.49 MIL/uL — ABNORMAL LOW (ref 4.22–5.81)
WBC: 2 10*3/uL — ABNORMAL LOW (ref 4.0–10.5)

## 2012-03-06 LAB — BASIC METABOLIC PANEL
CO2: 26 mEq/L (ref 19–32)
Calcium: 9.6 mg/dL (ref 8.4–10.5)
Creatinine, Ser: 1.23 mg/dL (ref 0.50–1.35)
Glucose, Bld: 142 mg/dL — ABNORMAL HIGH (ref 70–99)

## 2012-03-06 NOTE — Patient Instructions (Addendum)
[facility] Discharge Instructions  RECOMMENDATIONS MADE BY THE CONSULTANT AND ANY TEST RESULTS WILL BE SENT TO YOUR REFERRING PHYSICIAN.  EXAM FINDINGS BY THE PHYSICIAN TODAY AND SIGNS OR SYMPTOMS TO REPORT TO CLINIC OR PRIMARY PHYSICIAN: exam and discussion by PA.  MEDICATIONS PRESCRIBED:  You can stop the allopurinol after you finish what you have.  INSTRUCTIONS GIVEN AND DISCUSSED: Report fevers, night sweats, uncontrolled nausea, vomiting or shortness of breath.  SPECIAL INSTRUCTIONS/FOLLOW-UP: See Schedule.  Thank you for choosing [facility] to provide your oncology and hematology care.  To afford each patient quality time with our providers, please arrive at least 15 minutes before your scheduled appointment time.  With your help, our goal is to use those 15 minutes to complete the necessary work-up to ensure our physicians have the information they need to help with your evaluation and healthcare recommendations.    Effective January 1st, 2014, we ask that you re-schedule your appointment with our physicians should you arrive 10 or more minutes late for your appointment.  We strive to give you quality time with our providers, and arriving late affects you and other patients whose appointments are after yours.    Again, thank you for choosing [facility].  Our hope is that these requests will decrease the amount of time that you wait before being seen by our physicians.       _____________________________________________________________  I acknowledge that I have been informed and understand all the instructions given to me and received a copy. I do not have anymore questions at this time but understand that I may call the Cancer Center at [facility] at [phone] during business hours should I have any further questions or need assistance in obtaining follow-up care.    __________________________________________  _____________   __________ Signature of Patient or Authorized Representative            Date                   Time    __________________________________________ Nurse's Signature

## 2012-03-06 NOTE — Progress Notes (Signed)
[name] Overman, MD [address] [place] [address]  1. CLL     CURRENT THERAPY: S/P 5 cycles of Bendamustine D 1,2 every 21 days starting on 10/13/2011  INTERVAL HISTORY: [name] 66 y.o. male returns for  regular  visit for followup of Recurrent chronic lymphocytic leukemia.  Unfortunately, [name] had some neutropenic fevers which landed him in the ED.  There was no source for fever identified and he was discharged from the ED.  He feels well now.  He reports sweats when he was having fevers, but denies shaking chills. In light of this, we will reduce the patient's dose to 50% of the original dose.  He was already at a 33.3% dose reduction due being heavily treated before.   [name] wonders if he needs to continue his Allopurinol.  This medication was started to prevent Tumor Lysis syndrome.  Now that his counts are much better, and he does not have a history of Gout, this medication may be held.   [name] November, denies any complaints.  He will move on to cycle 6 of therapy followed by restaging scans.  After this, we will perform lab work every 4 weeks and follow him.     He admits to continued fatigue, but it is stable by his own admission.  Otherwise, complete ROS questioning is negative.    Past Medical History  Diagnosis Date  . Aortic stenosis   . Atrial fibrillation   . Coronary atherosclerosis of native coronary artery   . Hyperlipidemia   . Essential hypertension, benign   . BPH (benign prostatic hypertrophy)   . Elevated WBC count   . Arthritis   . Acid reflux   . Hypogammaglobulinemia   . Pre-diabetes 08/04/2011  . CLL (chronic lymphoblastic leukemia)   . Hodgkin's disease(201)   . Kidney stones     has HODGKIN'S DISEASE; CLL; HYPERLIPIDEMIA; OBESITY; CORONARY ATHEROSCLEROSIS NATIVE CORONARY ARTERY; AORTIC STENOSIS; Atrial fibrillation; FEVER UNSPECIFIED; OTHER DYSPHAGIA; AORTIC VALVE REPLACEMENT, HX OF; Essential hypertension, benign; NECK PAIN, LEFT;  GERD (gastroesophageal reflux disease); Thrombocytopenia; Hypogammaglobulinemia, acquired; Anemia; Tachyarrhythmia; Pre-diabetes; UTI (lower urinary tract infection); and Cellulitis of left thigh on his problem list.      has no known allergies.  [name] had no medications administered during this visit.  Past Surgical History  Procedure Date  . Eye surgery   . Aortic valve replacement 10/09    23mm Magna Pericardial   . Esophagogastroduodenoscopy     scrapping of throat and stretching  . Lithotripsy   . Bone marrow aspiration   . Portacath placement   . 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 Colon surgery     Denies any headaches, dizziness, double vision, fevers, chills, night sweats, nausea, vomiting, diarrhea, constipation, chest pain, heart palpitations, shortness of breath, blood in stool, black tarry stool, urinary pain, urinary burning, urinary frequency, hematuria.   PHYSICAL EXAMINATION  ECOG PERFORMANCE STATUS: 1 - Symptomatic but completely ambulatory  Filed Vitals:   03/06/12 0945  BP: 103/70  Pulse: 90  Temp: 97.6 F (36.4 C)  Resp: 18    GENERAL:alert, no distress, well nourished, well developed, comfortable, cooperative, obese, smiling and malodorous SKIN: skin color, texture, turgor are normal, no rashes or significant lesions HEAD: Normocephalic, No masses, lesions, tenderness or abnormalities EYES: normal, Conjunctiva are pink and non-injected EARS: External ears normal OROPHARYNX:mucous membranes are moist  NECK: supple, no adenopathy, trachea midline LYMPH:  no palpable  lymphadenopathy BREAST:not examined LUNGS: clear to auscultation and percussion HEART: regular rate & rhythm, no murmurs, no gallops, S1 normal and S2 normal ABDOMEN:abdomen soft, non-tender, obese, normal bowel sounds, no masses or organomegaly and no hepatosplenomegaly BACK: Back symmetric, no curvature., No CVA tenderness EXTREMITIES:less then 2 second capillary  refill, no joint deformities, effusion, or inflammation, no edema, no skin discoloration, no clubbing, no cyanosis  NEURO: alert & oriented x 3 with fluent speech, no focal motor/sensory deficits, gait normal    LABORATORY DATA: CBC    Component Value Date/Time   WBC 2.0* 03/06/2012 0927   RBC 3.49* 03/06/2012 0927   HGB 11.7* 03/06/2012 0927   HCT 34.9* 03/06/2012 0927   PLT 94* 03/06/2012 0927   MCV 100.0 03/06/2012 0927   MCH 33.5 03/06/2012 0927   MCHC 33.5 03/06/2012 0927   RDW 16.8* 03/06/2012 0927   LYMPHSABS 1.2 03/06/2012 0927   MONOABS 0.3 03/06/2012 0927   EOSABS 0.0 03/06/2012 0927   BASOSABS 0.1 03/06/2012 0927      Chemistry      Component Value Date/Time   NA 136 02/28/2012 0128   K 3.3* 02/28/2012 0128   CL 102 02/28/2012 0128   CO2 26 02/28/2012 0128   BUN 9 02/28/2012 0128   CREATININE 0.95 02/28/2012 0128   CREATININE 1.29 05/10/2011 1541      Component Value Date/Time   CALCIUM 8.4 02/28/2012 0128   ALKPHOS 85 02/15/2012 1009   AST 23 02/15/2012 1009   ALT 20 02/15/2012 1009   BILITOT 0.5 02/15/2012 1009         ASSESSMENT:  1. Recurrent chronic lymphocytic leukemia. Presently undergoing Bendamustine chemotherapy. S/P 3 cycle.  2. 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 He is in a complete remission from the Hodgkin's.  3. Status post fludarabine, Cytoxan, and rituximab therapy for 3 cycles when he presented with CLL in 2004.  4. Obesity.  5. Insect bites, multiple places on his body.  6. Possible early onset diabetes mellitus with mild elevation in his sugars lately. We will check hemoglobin A1c next time he is here.  7. Pan-hypogammaglobulinemia.  8. Severe deconditioning.  9. History of Agent Orange exposure during the [place] War and he states that just within the  last 2 months he has been granted full disability from the [place].  10.Coronary artery disease, status post bypass grafting x1 in October 2009.  11.Peripheral vascular disease.  12.Aortic stenosis with aortic valve replacement.  13.Benign prostatic hypertrophy.  14.Hyperlipidemia.  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  16.Circumcision in the past, leaving him with difficulty with a uniform stream and occasional incontinence.    PLAN:  1. I personally reviewed and went over laboratory results with the patient. 2. Cycle 6 as scheduled  3. Following cycle 6, will perform monthly labs work consisting of CBC diff, CMET, LDH, ESR 4. Return following CT scans as scheduled on 03/27/2012 for follow-up.  5. He may stop his Allopurinol.  6. Will reduce dose of Bendamustine to 50%.  He was already at a 66.7% dose reduction.   All questions were answered. The patient knows to call the clinic with any problems, questions or concerns. We can certainly see the patient much sooner if necessary.  The patient and plan discussed with Glenford Peers,  MD and he is in agreement with the aforementioned.  Alessandro Griep

## 2012-03-06 NOTE — Progress Notes (Signed)
Labs drawn today for bmp,cbc/diff 

## 2012-03-07 ENCOUNTER — Other Ambulatory Visit (HOSPITAL_COMMUNITY): Payer: Medicare Other

## 2012-03-07 ENCOUNTER — Other Ambulatory Visit (HOSPITAL_COMMUNITY): Payer: Self-pay | Admitting: Oncology

## 2012-03-08 ENCOUNTER — Inpatient Hospital Stay (HOSPITAL_COMMUNITY): Payer: Medicare Other

## 2012-03-09 ENCOUNTER — Telehealth (HOSPITAL_COMMUNITY): Payer: Self-pay

## 2012-03-09 ENCOUNTER — Inpatient Hospital Stay (HOSPITAL_COMMUNITY): Payer: Medicare Other

## 2012-03-10 ENCOUNTER — Ambulatory Visit (HOSPITAL_COMMUNITY): Payer: Medicare Other

## 2012-03-10 NOTE — Telephone Encounter (Signed)
03/09/2012 2:18 PM Phone (Incoming) [name], [name] (Self) [phone] (H) Pt needs to speak to someone about his visit with ophthalmologist.  03/10/12 6:20pm   Unable to reach patient by phone.  Message left for patient to call back./S. Mercy Riding, RN

## 2012-03-15 ENCOUNTER — Encounter (HOSPITAL_BASED_OUTPATIENT_CLINIC_OR_DEPARTMENT_OTHER): Payer: Medicare Other

## 2012-03-15 VITALS — BP 129/79 | HR 78 | Temp 97.4°F | Resp 20 | Wt 228.4 lb

## 2012-03-15 DIAGNOSIS — Z5111 Encounter for antineoplastic chemotherapy: Secondary | ICD-10-CM

## 2012-03-15 DIAGNOSIS — C8190 Hodgkin lymphoma, unspecified, unspecified site: Secondary | ICD-10-CM

## 2012-03-15 DIAGNOSIS — Z5112 Encounter for antineoplastic immunotherapy: Secondary | ICD-10-CM

## 2012-03-15 DIAGNOSIS — C9110 Chronic lymphocytic leukemia of B-cell type not having achieved remission: Secondary | ICD-10-CM

## 2012-03-15 LAB — CBC WITH DIFFERENTIAL/PLATELET
Basophils Absolute: 0 10*3/uL (ref 0.0–0.1)
Basophils Relative: 0 % (ref 0–1)
Eosinophils Absolute: 0 10*3/uL (ref 0.0–0.7)
Eosinophils Relative: 0 % (ref 0–5)
HCT: 29.8 % — ABNORMAL LOW (ref 39.0–52.0)
Hemoglobin: 10 g/dL — ABNORMAL LOW (ref 13.0–17.0)
Lymphocytes Relative: 30 % (ref 12–46)
Lymphs Abs: 0.6 10*3/uL — ABNORMAL LOW (ref 0.7–4.0)
MCH: 33.1 pg (ref 26.0–34.0)
MCHC: 33.6 g/dL (ref 30.0–36.0)
MCV: 98.7 fL (ref 78.0–100.0)
Monocytes Absolute: 0.2 10*3/uL (ref 0.1–1.0)
Monocytes Relative: 9 % (ref 3–12)
Neutro Abs: 1.2 10*3/uL — ABNORMAL LOW (ref 1.7–7.7)
Neutrophils Relative %: 62 % (ref 43–77)
Platelets: 116 10*3/uL — ABNORMAL LOW (ref 150–400)
RBC: 3.02 MIL/uL — ABNORMAL LOW (ref 4.22–5.81)
RDW: 16.9 % — ABNORMAL HIGH (ref 11.5–15.5)
WBC: 2 10*3/uL — ABNORMAL LOW (ref 4.0–10.5)

## 2012-03-15 LAB — BASIC METABOLIC PANEL
BUN: 14 mg/dL (ref 6–23)
Calcium: 8.9 mg/dL (ref 8.4–10.5)
Creatinine, Ser: 1.15 mg/dL (ref 0.50–1.35)
GFR calc non Af Amer: 64 mL/min — ABNORMAL LOW (ref >90)
Glucose, Bld: 188 mg/dL — ABNORMAL HIGH (ref 70–99)
Sodium: 138 mEq/L (ref 135–145)

## 2012-03-15 MED ORDER — SODIUM CHLORIDE 0.9 % IJ SOLN
10.0000 mL | INTRAMUSCULAR | Status: DC | PRN
  Administered 2012-03-15: 10 mL
  Filled 2012-03-15: qty 10

## 2012-03-15 MED ORDER — SODIUM CHLORIDE 0.9 % IV SOLN
50.0000 mg/m2 | Freq: Once | INTRAVENOUS | Status: AC
  Administered 2012-03-15: 115 mg via INTRAVENOUS
  Filled 2012-03-15: qty 23

## 2012-03-15 MED ORDER — SODIUM CHLORIDE 0.9 % IV SOLN
Freq: Once | INTRAVENOUS | Status: AC
  Administered 2012-03-15: 10:00:00 via INTRAVENOUS

## 2012-03-15 MED ORDER — SODIUM CHLORIDE 0.9 % IJ SOLN
INTRAMUSCULAR | Status: AC
  Filled 2012-03-15: qty 10

## 2012-03-15 MED ORDER — HEPARIN SOD (PORK) LOCK FLUSH 100 UNIT/ML IV SOLN
500.0000 [IU] | Freq: Once | INTRAVENOUS | Status: DC | PRN
  Filled 2012-03-15: qty 5

## 2012-03-15 MED ORDER — HEPARIN SOD (PORK) LOCK FLUSH 100 UNIT/ML IV SOLN
INTRAVENOUS | Status: AC
  Filled 2012-03-15: qty 5

## 2012-03-15 MED ORDER — SODIUM CHLORIDE 0.9 % IV SOLN
375.0000 mg/m2 | Freq: Once | INTRAVENOUS | Status: AC
  Administered 2012-03-15: 900 mg via INTRAVENOUS
  Filled 2012-03-15: qty 90

## 2012-03-15 MED ORDER — SODIUM CHLORIDE 0.9 % IV SOLN
Freq: Once | INTRAVENOUS | Status: AC
  Administered 2012-03-15: 8 mg via INTRAVENOUS
  Filled 2012-03-15: qty 4

## 2012-03-16 ENCOUNTER — Encounter (HOSPITAL_BASED_OUTPATIENT_CLINIC_OR_DEPARTMENT_OTHER): Payer: Medicare Other

## 2012-03-16 VITALS — BP 114/74 | HR 71 | Temp 97.6°F | Resp 16

## 2012-03-16 DIAGNOSIS — C9110 Chronic lymphocytic leukemia of B-cell type not having achieved remission: Secondary | ICD-10-CM

## 2012-03-16 DIAGNOSIS — Z5111 Encounter for antineoplastic chemotherapy: Secondary | ICD-10-CM

## 2012-03-16 MED ORDER — HEPARIN SOD (PORK) LOCK FLUSH 100 UNIT/ML IV SOLN
500.0000 [IU] | Freq: Once | INTRAVENOUS | Status: AC | PRN
  Administered 2012-03-16: 500 [IU]
  Filled 2012-03-16: qty 5

## 2012-03-16 MED ORDER — SODIUM CHLORIDE 0.9 % IV SOLN
50.0000 mg/m2 | Freq: Once | INTRAVENOUS | Status: AC
  Administered 2012-03-16: 115 mg via INTRAVENOUS
  Filled 2012-03-16: qty 23

## 2012-03-16 MED ORDER — SODIUM CHLORIDE 0.9 % IJ SOLN
INTRAMUSCULAR | Status: AC
  Filled 2012-03-16: qty 10

## 2012-03-16 MED ORDER — HEPARIN SOD (PORK) LOCK FLUSH 100 UNIT/ML IV SOLN
INTRAVENOUS | Status: AC
  Filled 2012-03-16: qty 5

## 2012-03-16 MED ORDER — SODIUM CHLORIDE 0.9 % IV SOLN
Freq: Once | INTRAVENOUS | Status: AC
  Administered 2012-03-16: 500 mL via INTRAVENOUS

## 2012-03-16 MED ORDER — SODIUM CHLORIDE 0.9 % IV SOLN
Freq: Once | INTRAVENOUS | Status: AC
  Administered 2012-03-16: 8 mg via INTRAVENOUS
  Filled 2012-03-16: qty 4

## 2012-03-16 MED ORDER — SODIUM CHLORIDE 0.9 % IJ SOLN
10.0000 mL | INTRAMUSCULAR | Status: DC | PRN
  Administered 2012-03-16: 10 mL
  Filled 2012-03-16: qty 10

## 2012-03-16 NOTE — Progress Notes (Signed)
[name] rc'd Rituxan and Bendamustine yesterday, and per Samuella Bruin, PA-C patient can receive today's Bendamustine treatment with an ANC of 1.2.

## 2012-03-16 NOTE — Progress Notes (Signed)
Tolerated well

## 2012-03-17 ENCOUNTER — Encounter (HOSPITAL_BASED_OUTPATIENT_CLINIC_OR_DEPARTMENT_OTHER): Payer: Medicare Other

## 2012-03-17 VITALS — BP 119/55 | HR 72

## 2012-03-17 DIAGNOSIS — C9110 Chronic lymphocytic leukemia of B-cell type not having achieved remission: Secondary | ICD-10-CM

## 2012-03-17 MED ORDER — PEGFILGRASTIM INJECTION 6 MG/0.6ML
6.0000 mg | Freq: Once | SUBCUTANEOUS | Status: AC
  Administered 2012-03-17: 6 mg via SUBCUTANEOUS

## 2012-03-17 MED ORDER — PEGFILGRASTIM INJECTION 6 MG/0.6ML
SUBCUTANEOUS | Status: AC
  Filled 2012-03-17: qty 0.6

## 2012-03-17 NOTE — Progress Notes (Signed)
[name] presents today for injection per MD orders. Neulasta 6mg  administered SQ in right Abdomen. Administration without incident. Patient tolerated well.

## 2012-03-27 ENCOUNTER — Ambulatory Visit (HOSPITAL_COMMUNITY)
Admission: RE | Admit: 2012-03-27 | Discharge: 2012-03-27 | Disposition: A | Discharge status: Home / Self Care | Payer: Medicare Other | Source: Ambulatory Visit | Attending: Oncology | Admitting: Oncology

## 2012-03-27 DIAGNOSIS — C9110 Chronic lymphocytic leukemia of B-cell type not having achieved remission: Secondary | ICD-10-CM | POA: Insufficient documentation

## 2012-03-27 DIAGNOSIS — R161 Splenomegaly, not elsewhere classified: Secondary | ICD-10-CM | POA: Insufficient documentation

## 2012-03-27 MED ORDER — IOHEXOL 300 MG/ML  SOLN
100.0000 mL | Freq: Once | INTRAMUSCULAR | Status: AC | PRN
  Administered 2012-03-27: 100 mL via INTRAVENOUS

## 2012-03-28 ENCOUNTER — Telehealth (HOSPITAL_COMMUNITY): Payer: Self-pay

## 2012-03-28 ENCOUNTER — Other Ambulatory Visit: Payer: Self-pay | Admitting: Adult Health

## 2012-03-28 NOTE — Telephone Encounter (Signed)
Patient notified that scan shows significant improvement and results will be discussed in detail on his appointment here on 03/31/12.

## 2012-03-31 ENCOUNTER — Encounter (HOSPITAL_COMMUNITY): Payer: Medicare Other | Attending: Oncology | Admitting: Oncology

## 2012-03-31 VITALS — BP 139/72 | HR 75 | Temp 97.0°F | Resp 20 | Wt 226.2 lb

## 2012-03-31 DIAGNOSIS — I4891 Unspecified atrial fibrillation: Secondary | ICD-10-CM

## 2012-03-31 DIAGNOSIS — D801 Nonfamilial hypogammaglobulinemia: Secondary | ICD-10-CM

## 2012-03-31 DIAGNOSIS — Z8571 Personal history of Hodgkin lymphoma: Secondary | ICD-10-CM

## 2012-03-31 DIAGNOSIS — C9110 Chronic lymphocytic leukemia of B-cell type not having achieved remission: Secondary | ICD-10-CM

## 2012-03-31 DIAGNOSIS — C8190 Hodgkin lymphoma, unspecified, unspecified site: Secondary | ICD-10-CM | POA: Insufficient documentation

## 2012-03-31 NOTE — Progress Notes (Signed)
Problem number 1 recurrent chronic lymphocytic leukemia status post 6 cycles of bendamustine and Rituxan. He has achieved an excellent response with disappearance of all enlarged lymph nodes in his chest and abdomen and pelvis. Even his spleen size has diminished significantly. His spleen was clearly enlarging due to his CLL rather than fatty infiltration of his liver and possible cirrhosis. We are going to try him on maintenance Rituxan every 12 weeks for 2 years in the hopes that we can suppress this. He has no B. symptoms today. He is eating too well. Weight is higher than it should be. Problem #2 history of Hodgkin's disease thus far without any evidence for recurrence. Problem #3 hypogammaglobulinemia secondary to the above 2 diagnoses Problem #4 obesity Problem #5 diabetes mellitus Problem #6 BPH Problem #7 aortic stenosis with atrial fibrillation [name] is doing extremely well right now. I went over his CAT scans with him. They showed no enlarged lymph nodes presently. Even his spleen has shrunk dramatically his blood counts have been doing very well however we did have to dose reduce his bendamustine by 50% at the end of his treatment.  I've discussed the maintenance Rituxan with him and he is willing to proceed. We will do this every 12 weeks. We will have my PA, Jenita Seashore, see him at the end of March and I will see him at the end of June after we do repeat CAT scans at the end of June.

## 2012-03-31 NOTE — Patient Instructions (Addendum)
[facility] Discharge Instructions  RECOMMENDATIONS MADE BY THE CONSULTANT AND ANY TEST RESULTS WILL BE SENT TO YOUR REFERRING PHYSICIAN.  EXAM FINDINGS BY THE PHYSICIAN TODAY AND SIGNS OR SYMPTOMS TO REPORT TO CLINIC OR PRIMARY PHYSICIAN: Exam and discussion by MD.  Your scans were good and we will get you started on maintenance therapy with Rituxan in March.  Blood work will be done every 12 weeks and will start just before you start rituxan in March.  We will repeat your scans in June.  MEDICATIONS PRESCRIBED:  none  INSTRUCTIONS GIVEN AND DISCUSSED: Report fevers, night sweats, recurring infections, etc.  SPECIAL INSTRUCTIONS/FOLLOW-UP: Labs every 12 weeks, start Rituxan maintenance therapy in March and to see PA in 3 months.  Thank you for choosing [facility] to provide your oncology and hematology care.  To afford each patient quality time with our providers, please arrive at least 15 minutes before your scheduled appointment time.  With your help, our goal is to use those 15 minutes to complete the necessary work-up to ensure our physicians have the information they need to help with your evaluation and healthcare recommendations.    Effective January 1st, 2014, we ask that you re-schedule your appointment with our physicians should you arrive 10 or more minutes late for your appointment.  We strive to give you quality time with our providers, and arriving late affects you and other patients whose appointments are after yours.    Again, thank you for choosing [facility].  Our hope is that these requests will decrease the amount of time that you wait before being seen by our physicians.       _____________________________________________________________  I acknowledge that I have been informed and understand all the instructions given to me and received a copy. I do not have anymore questions at this time but understand that I may call  the Cancer Center at [facility] at [phone] during business hours should I have any further questions or need assistance in obtaining follow-up care.

## 2012-04-03 ENCOUNTER — Encounter: Payer: Self-pay | Admitting: Cardiology

## 2012-04-03 ENCOUNTER — Ambulatory Visit (INDEPENDENT_AMBULATORY_CARE_PROVIDER_SITE_OTHER): Payer: Medicare Other | Admitting: Cardiology

## 2012-04-03 VITALS — BP 111/71 | HR 81 | Ht 70.0 in | Wt 226.0 lb

## 2012-04-03 DIAGNOSIS — I4891 Unspecified atrial fibrillation: Secondary | ICD-10-CM

## 2012-04-03 DIAGNOSIS — Z954 Presence of other heart-valve replacement: Secondary | ICD-10-CM

## 2012-04-03 DIAGNOSIS — E785 Hyperlipidemia, unspecified: Secondary | ICD-10-CM

## 2012-04-03 DIAGNOSIS — I2510 Atherosclerotic heart disease of native coronary artery without angina pectoris: Secondary | ICD-10-CM

## 2012-04-03 DIAGNOSIS — I10 Essential (primary) hypertension: Secondary | ICD-10-CM

## 2012-04-03 NOTE — Assessment & Plan Note (Signed)
Normal blood pressure today. 

## 2012-04-03 NOTE — Assessment & Plan Note (Signed)
He has been off Crestor at least 3 months. Followup FLP and LFT. We can decide from there whether we might try a lower dose or different preparation.

## 2012-04-03 NOTE — Assessment & Plan Note (Signed)
He is in sinus rhythm on examination today. Continue aspirin for now with medical complexity and ongoing chemotherapy for CLL.

## 2012-04-03 NOTE — Progress Notes (Signed)
Clinical Summary [name] is a 67 y.o.male presenting for followup. He was seen in July 2013. Interval followup with Dr. [name] noted.  From a cardiac perspective he reports no new symptoms, stable dyspnea on exertion, no angina or palpitations.  He states that he has been compliant with his medications, although did stop Crestor approximately 3 months ago. He has been undergoing chemotherapy, and it is difficult to correlate symptoms with potential etiologies, but he thought that perhaps some of his weakness and intermittent muscle cramping might be related to Crestor. He has not however noted any improvement since being off the medication. No recent panel.  No Known Allergies  Current Outpatient Prescriptions  Medication Sig Dispense Refill  . acetaminophen (TYLENOL) 325 MG tablet Take 650 mg by mouth once. Take 2 tablets (=650mg ) 1 hour prior to Rituxan.      Marland Kitchen acyclovir (ZOVIRAX) 200 MG capsule TAKE ONE CAPSULE BY MOUTH TWICE DAILY  60 capsule  2  . allopurinol (ZYLOPRIM) 300 MG tablet Take 300 mg by mouth every evening.      Marland Kitchen aspirin EC 81 MG tablet Take 81 mg by mouth daily.      . ciprofloxacin (CIPRO) 500 MG tablet       . dexamethasone (DECADRON) 4 MG tablet Starting the day before chemo, take 2 tablets in the am and 2 tablets in the pm. The morning of Rituxan take 2 tablets in the am. Then starting the day after chemo, take 2 tablets in the am x 2 days. Take with food.      Marland Kitchen dexamethasone (DECADRON) 4 MG tablet TAKE TWO TABLETS BY MOUTH EVERY DAY STARTING  THE  DAY  AFTER  CHEMOTHERAPY  FOR  2  DAYS.  TAKE  WITH  FOOD.  30 tablet  0  . diphenhydrAMINE (BENADRYL) 25 MG tablet Take 50 mg by mouth. Take 2 tablets (=50mg ) 1 hour prior to Rituxan.      . folic acid (FOLVITE) 1 MG tablet Take 1 tablet (1 mg total) by mouth daily.  90 tablet  0  . LORazepam (ATIVAN) 1 MG tablet       . metoprolol (LOPRESSOR) 50 MG tablet TAKE ONE TABLET BY MOUTH TWICE DAILY  60 tablet  1  .  Multiple Vitamins-Minerals (CENTRUM SILVER ULTRA MENS PO) Take 1 tablet by mouth every morning.       Marland Kitchen OMEGA 3 1000 MG CAPS Take 1,000 mg by mouth every morning.       Marland Kitchen omeprazole (PRILOSEC) 20 MG capsule Take 20 mg by mouth every morning.       . ondansetron (ZOFRAN) 8 MG tablet Take 1 tablet two times a day starting the day after chemo for 2 days. Then take 1 tablet two times a day as needed for nausea or vomiting.  30 tablet  1  . prochlorperazine (COMPAZINE) 10 MG tablet Take 1 tablet (10 mg total) by mouth every 6 (six) hours as needed (Nausea or vomiting).  30 tablet  1  . RiTUXimab (RITUXAN IV) Inject into the vein every 28 (twenty-eight) days.      . sodium chloride 0.9 % SOLN 500 mL with bendamustine 100 MG SOLR Inject into the vein once. Every 28 days      . rosuvastatin (CRESTOR) 40 MG tablet Take 40 mg by mouth every other day. Takes before bedtime      . sulfamethoxazole-trimethoprim (BACTRIM DS,SEPTRA DS) 800-160 MG per tablet Take 1 tablet by mouth daily. Taking  Mondays, Wednesdays and Fridays  30 tablet  1   No current facility-administered medications for this visit.   Facility-Administered Medications Ordered in Other Visits  Medication Dose Route Frequency Provider Last Rate Last Dose  . sodium chloride 0.9 % injection 10 mL  10 mL Intracatheter PRN Randall An, MD   10 mL at 02/17/12 1019    Past Medical History  Diagnosis Date  . Aortic stenosis   . Atrial fibrillation   . Coronary atherosclerosis of native coronary artery   . Hyperlipidemia   . Essential hypertension, benign   . BPH (benign prostatic hypertrophy)   . Elevated WBC count   . Arthritis   . Acid reflux   . Hypogammaglobulinemia   . Pre-diabetes 08/04/2011  . CLL (chronic lymphoblastic leukemia)   . Hodgkin's disease(201)   . Kidney stones     Past Surgical History  Procedure Date  . Eye surgery   . Aortic valve replacement 10/09    23mm Magna Pericardial   . Esophagogastroduodenoscopy       scrapping of throat and stretching  . Lithotripsy   . Bone marrow aspiration   . Portacath placement   . 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 Colon surgery     Social History [name] reports that he has never smoked. He has never used smokeless tobacco. [name] reports that he does not drink alcohol.  Review of Systems Negative except as outlined above.  Physical Examination Filed Vitals:   04/03/12 1321  BP: 111/71  Pulse: 81   Filed Weights   04/03/12 1321  Weight: 226 lb (102.513 kg)   No acute distress.  HEENT: Conjunctiva and lids normal, oropharynx with poor dentition.  Neck: Supple, no elevated jugular venous pressure or bruits.  Lungs: Clear to auscultation, nonlabored.  Cardiac: Regular rate and rhythm, S4, 2/6 systolic murmur at the base, no S3.  Abdomen: Obese, nontender, bowel sounds present.  Extremities: Venous stasis noted, 1-2+ edema, distal pulses diminished.  Skin: Warm and dry, scattered excoriations.  Musculoskeletal: No gross deformities.  Neuropsychiatric: Alert and oriented x3, affect grossly normal.  Problem List and Plan   AORTIC VALVE REPLACEMENT, HX OF Stable exam, status post pericardial AVR. Continue observation.  Atrial fibrillation He is in sinus rhythm on examination today. Continue aspirin for now with medical complexity and ongoing chemotherapy for CLL.  CORONARY ATHEROSCLEROSIS NATIVE CORONARY ARTERY No active angina symptoms.  Essential hypertension, benign Normal blood pressure today.  HYPERLIPIDEMIA He has been off Crestor at least 3 months. Followup FLP and LFT. We can decide from there whether we might try a lower dose or different preparation.    Jonelle Sidle, M.D., F.A.C.C.

## 2012-04-03 NOTE — Assessment & Plan Note (Signed)
No active angina symptoms. 

## 2012-04-03 NOTE — Patient Instructions (Addendum)
Your physician recommends that you schedule a follow-up appointment in: 6 months with SM  Your physician recommends that you return for lab work in: within 2 WEEKS Slips given

## 2012-04-03 NOTE — Assessment & Plan Note (Signed)
Stable exam, status post pericardial AVR. Continue observation.

## 2012-04-07 ENCOUNTER — Other Ambulatory Visit: Payer: Self-pay | Admitting: Cardiology

## 2012-04-07 LAB — LIPID PANEL
HDL: 25 mg/dL — ABNORMAL LOW (ref >39)
Total CHOL/HDL Ratio: 10.7 Ratio

## 2012-04-08 LAB — HEPATIC FUNCTION PANEL
AST: 23 U/L (ref 0–37)
Albumin: 3.8 g/dL (ref 3.5–5.2)
Total Bilirubin: 0.7 mg/dL (ref 0.3–1.2)

## 2012-04-10 ENCOUNTER — Encounter: Payer: Self-pay | Admitting: *Deleted

## 2012-04-10 ENCOUNTER — Telehealth: Payer: Self-pay | Admitting: Family Medicine

## 2012-04-10 DIAGNOSIS — R7301 Impaired fasting glucose: Secondary | ICD-10-CM

## 2012-04-10 DIAGNOSIS — Z1211 Encounter for screening for malignant neoplasm of colon: Secondary | ICD-10-CM

## 2012-04-10 MED ORDER — ROSUVASTATIN CALCIUM 10 MG PO TABS: 10.0000 mg | ORAL_TABLET | Freq: Every day | ORAL | Status: DC

## 2012-04-10 NOTE — Telephone Encounter (Signed)
Pls call pt, he needs HBA1C , was prediabetic in March 2013, and also a pSa none isince 2014. Let him know I see where he has had labs with cardiology but he needs these  Also encourage him to make alppt for annual wellness after her gets the labs ,

## 2012-04-10 NOTE — Addendum Note (Signed)
Addended by: Derry Lory A on: 04/10/2012 03:20 PM   Modules accepted: Orders

## 2012-04-12 NOTE — Telephone Encounter (Signed)
Called patient and left message for them to return call at the office   

## 2012-04-14 ENCOUNTER — Encounter: Payer: Self-pay | Admitting: *Deleted

## 2012-04-18 NOTE — Addendum Note (Signed)
Addended by: Abner Greenspan on: 04/18/2012 01:08 PM   Modules accepted: Orders

## 2012-04-18 NOTE — Telephone Encounter (Signed)
Labs ordered and mailed to pt with note to have drawn

## 2012-05-02 ENCOUNTER — Encounter (HOSPITAL_COMMUNITY): Payer: Medicare Other | Attending: Oncology | Admitting: Oncology

## 2012-05-02 ENCOUNTER — Encounter (HOSPITAL_COMMUNITY): Payer: Self-pay | Admitting: Oncology

## 2012-05-02 VITALS — BP 113/62 | HR 79 | Temp 97.9°F | Resp 18 | Wt 224.0 lb

## 2012-05-02 DIAGNOSIS — C9110 Chronic lymphocytic leukemia of B-cell type not having achieved remission: Secondary | ICD-10-CM

## 2012-05-02 DIAGNOSIS — S025XXA Fracture of tooth (traumatic), initial encounter for closed fracture: Secondary | ICD-10-CM | POA: Insufficient documentation

## 2012-05-02 DIAGNOSIS — K121 Other forms of stomatitis: Secondary | ICD-10-CM

## 2012-05-02 DIAGNOSIS — K089 Disorder of teeth and supporting structures, unspecified: Secondary | ICD-10-CM

## 2012-05-02 DIAGNOSIS — K1370 Unspecified lesions of oral mucosa: Secondary | ICD-10-CM | POA: Insufficient documentation

## 2012-05-02 LAB — CBC WITH DIFFERENTIAL/PLATELET
Eosinophils Relative: 0 % (ref 0–5)
HCT: 31.9 % — ABNORMAL LOW (ref 39.0–52.0)
Lymphocytes Relative: 45 % (ref 12–46)
Lymphs Abs: 0.6 10*3/uL — ABNORMAL LOW (ref 0.7–4.0)
Neutro Abs: 0.4 10*3/uL — ABNORMAL LOW (ref 1.7–7.7)
Neutrophils Relative %: 24 % — ABNORMAL LOW (ref 43–77)
Platelets: 117 10*3/uL — ABNORMAL LOW (ref 150–400)
WBC: 1.5 10*3/uL — ABNORMAL LOW (ref 4.0–10.5)

## 2012-05-02 MED ORDER — AMOXICILLIN-POT CLAVULANATE 875-125 MG PO TABS: 1.0000 | ORAL_TABLET | Freq: Two times a day (BID) | ORAL | Status: DC

## 2012-05-02 NOTE — Progress Notes (Signed)
[name] is here reporting some tooth/gum pain.  He does not have a dentist.  I was asked to see him.  He reports that the pain started over the weekend.  He denies any trauma.  It is tender to eat.  He denies any fevers or chills, but reports "I feel fever in it."  BP 113/62  Pulse 79  Temp 97.9 F (36.6 C) (Oral)  Resp 18  Wt 224 lb (101.606 kg) Gen: Mild edema on right side of face at jaw. Oral: Poor dentition.  Gum disease noted.  Right, lower #3 or 4 tooth broken with an associated 0.5 cm ulcer that is deep and erythematous at the site of broken tooth.  Assessment: 1. Right lower tooth fracture with sharp edge 2. Right, lower buccal lesion with erythema  Plan: 1. Augmentin BID x 7 days 2. CBC diff today 3. Patient needs to see a Dentist about this.  The tooth likely needs pulled and this takes precedence at this time.  Patient in agreement with the aforementioned.   Patient and plan discussed with Dr. Madlyn [name]

## 2012-05-02 NOTE — Patient Instructions (Addendum)
[facility] Discharge Instructions  RECOMMENDATIONS MADE BY THE CONSULTANT AND ANY TEST RESULTS WILL BE SENT TO YOUR REFERRING PHYSICIAN.  EXAM FINDINGS BY THE PHYSICIAN TODAY AND SIGNS OR SYMPTOMS TO REPORT TO CLINIC OR PRIMARY PHYSICIAN: Exam and discussion by PA.  You need to see a dentist and get the tooth taken care of.  We will check your blood work today to make sure your labs are ok for dental work.  MEDICATIONS PRESCRIBED:  Augmentin take 1 twice daily for 7 days.    SPECIAL INSTRUCTIONS/FOLLOW-UP: Keep scheduled appointments.  Thank you for choosing [facility] to provide your oncology and hematology care.  To afford each patient quality time with our providers, please arrive at least 15 minutes before your scheduled appointment time.  With your help, our goal is to use those 15 minutes to complete the necessary work-up to ensure our physicians have the information they need to help with your evaluation and healthcare recommendations.    Effective January 1st, 2014, we ask that you re-schedule your appointment with our physicians should you arrive 10 or more minutes late for your appointment.  We strive to give you quality time with our providers, and arriving late affects you and other patients whose appointments are after yours.    Again, thank you for choosing [facility].  Our hope is that these requests will decrease the amount of time that you wait before being seen by our physicians.       _____________________________________________________________  Should you have questions after your visit to [facility], please contact our office at [phone] between the hours of 8:30 a.m. and 5:00 p.m.  Voicemails left after 4:30 p.m. will not be returned until the following business day.  For prescription refill requests, have your pharmacy contact our office with your prescription refill request.

## 2012-05-02 NOTE — Progress Notes (Signed)
[name] presented for labwork. Labs per MD order drawn via Peripheral Line 23 gauge needle inserted in Right AC  Good blood return present. Procedure without incident.  Needle removed intact. Patient tolerated procedure well.

## 2012-05-04 ENCOUNTER — Ambulatory Visit (INDEPENDENT_AMBULATORY_CARE_PROVIDER_SITE_OTHER): Payer: Medicare Other | Admitting: Family Medicine

## 2012-05-04 ENCOUNTER — Encounter: Payer: Self-pay | Admitting: Family Medicine

## 2012-05-04 VITALS — BP 108/72 | HR 78 | Resp 18 | Ht 70.0 in | Wt 221.1 lb

## 2012-05-04 DIAGNOSIS — K219 Gastro-esophageal reflux disease without esophagitis: Secondary | ICD-10-CM

## 2012-05-04 DIAGNOSIS — E669 Obesity, unspecified: Secondary | ICD-10-CM

## 2012-05-04 DIAGNOSIS — R7309 Other abnormal glucose: Secondary | ICD-10-CM

## 2012-05-04 DIAGNOSIS — E785 Hyperlipidemia, unspecified: Secondary | ICD-10-CM

## 2012-05-04 DIAGNOSIS — R7303 Prediabetes: Secondary | ICD-10-CM

## 2012-05-04 DIAGNOSIS — K044 Acute apical periodontitis of pulpal origin: Secondary | ICD-10-CM

## 2012-05-04 DIAGNOSIS — K047 Periapical abscess without sinus: Secondary | ICD-10-CM

## 2012-05-04 NOTE — Patient Instructions (Addendum)
Annual wellness in July, call if you need me before  You DO look much better.  I will attempt to get some crestor 10 mg samples for you  Please take entire course of antibiotics for the tooth, and  I trust you will be able to have the dental work you need soon  We will add HBa1C and Vit D to recent labs at hematology and will contact you if they are abnormal

## 2012-05-12 DIAGNOSIS — K029 Dental caries, unspecified: Secondary | ICD-10-CM | POA: Insufficient documentation

## 2012-05-12 NOTE — Progress Notes (Signed)
  Subjective:    Patient ID: [name], male    DOB: 1945-07-24, 67 y.o.   MRN: [id number]  HPI Pt in for re evaqluation. He looks better than when last seen approx 8 months ago. He has recently completed aggressive chemo for relapsedleukemia with great success. Generally he feels better. In the past several days he has developed acute pain and swelling of the right lower jaw and has been started on augmentin through oncology, cost of dental cafre ois a concern, but he undoubtedly needs this  Lipids are again very high, hopes to be able to buy recommended medication next week, this also is costly so I will attempt to help with samples if possible   Review of Systems See HPI Denies recent fever or chills. Denies sinus pressure, nasal congestion, ear pain or sore throat. Denies chest congestion, productive cough or wheezing. Denies chest pains, palpitations and leg swelling Denies abdominal pain, nausea, vomiting,diarrhea or constipation.   Denies dysuria, frequency, hesitancy or incontinence. Denies joint pain, swelling and limitation in mobility. Denies headaches, seizures, numbness, or tingling. Denies depression, anxiety or insomnia. Denies skin break down or rash.        Objective:   Physical Exam  Patient alert and oriented and in no cardiopulmonary distress.Pt in pain with swollen , tender, right lower jaw  HEENT: , EOMI, no sinus tenderness,  oropharynx pink and moist.  Neck supple right anterior cerviocal adenopathy.  Chest: Clear to auscultation bilaterally.  CVS: S1, S2 no murmurs, no S3.  ABD: Soft non tender. Bowel sounds normal.  Ext: No edema  MS: Adequate ROM spine, shoulders, hips and knees.  Skin: Intact, no ulcerations or rash noted.  Psych: Good eye contact, normal affect. Memory intact not anxious or depressed appearing.  CNS: CN 2-12 intact, power, tone and sensation normal throughout.       Assessment & Plan:

## 2012-05-12 NOTE — Assessment & Plan Note (Signed)
Patient educated about the importance of limiting  Carbohydrate intake , the need to commit to daily physical activity for a minimum of 30 minutes , and to commit weight loss. The fact that changes in all these areas will reduce or eliminate all together the development of diabetes is stressed.   Updated lab needed 

## 2012-05-12 NOTE — Assessment & Plan Note (Signed)
Acute innfection with swelling of right lower jaw, currently on antibiotics needs dental care asap

## 2012-05-12 NOTE — Assessment & Plan Note (Signed)
Uncontrolled, will attempt to obtain samples for pt of crestor

## 2012-05-12 NOTE — Assessment & Plan Note (Signed)
Improved. Pt applauded on succesful weight loss through lifestyle change, and encouraged to continue same. Weight loss goal set for the next several months.  

## 2012-05-12 NOTE — Assessment & Plan Note (Signed)
Controlled, no change in medication  

## 2012-06-06 ENCOUNTER — Other Ambulatory Visit: Payer: Self-pay | Admitting: Adult Health

## 2012-06-06 ENCOUNTER — Other Ambulatory Visit (HOSPITAL_COMMUNITY): Payer: Self-pay | Admitting: Oncology

## 2012-06-19 ENCOUNTER — Ambulatory Visit (INDEPENDENT_AMBULATORY_CARE_PROVIDER_SITE_OTHER): Payer: Medicare Other | Admitting: Family Medicine

## 2012-06-19 ENCOUNTER — Encounter: Payer: Self-pay | Admitting: Family Medicine

## 2012-06-19 VITALS — BP 120/80 | HR 81 | Temp 97.9°F | Resp 16 | Ht 70.0 in | Wt 219.0 lb

## 2012-06-19 DIAGNOSIS — E785 Hyperlipidemia, unspecified: Secondary | ICD-10-CM

## 2012-06-19 DIAGNOSIS — Z1211 Encounter for screening for malignant neoplasm of colon: Secondary | ICD-10-CM

## 2012-06-19 DIAGNOSIS — E669 Obesity, unspecified: Secondary | ICD-10-CM

## 2012-06-19 DIAGNOSIS — I10 Essential (primary) hypertension: Secondary | ICD-10-CM

## 2012-06-19 DIAGNOSIS — K649 Unspecified hemorrhoids: Secondary | ICD-10-CM | POA: Insufficient documentation

## 2012-06-19 MED ORDER — HYDROCORTISONE ACE-PRAMOXINE 1-1 % RE FOAM: 1.0000 | Freq: Two times a day (BID) | RECTAL | Status: DC

## 2012-06-19 NOTE — Progress Notes (Signed)
  Subjective:    Patient ID: [name], male    DOB: Jun 02, 1945, 67 y.o.   MRN: [id number]  HPI 3 day h/o paiful anal swelling no blood in stool, no excessive straining at stool Otherwise has been doing well, has ahd problems with hemorhoids in the past   Review of Systems See HPI Denies recent fever or chills. Denies sinus pressure, nasal congestion, ear pain or sore throat. Denies chest congestion, productive cough or wheezing. Denies chest pains, palpitations and leg swelling Denies dysuria, frequency, hesitancy or incontinence. Denies joint pain, swelling and limitation in mobility. Denies headaches, seizures, numbness, or tingling. Denies depression, anxiety or insomnia. Denies skin break down or rash.        Objective:   Physical Exam  Patient alert and oriented and in no cardiopulmonary distress.  HEENT: No facial asymmetry, EOMI, no sinus tenderness,  oropharynx pink and moist.  Neck supple no adenopathy.  Chest: Clear to auscultation bilaterally.  CVS: S1, S2 no murmurs, no S3.  ABD: Soft non tender. Bowel sounds normal. Rectal: positive hemmorhoids, heme negative stool Ext: No edema  MS: Adequate ROM spine, shoulders, hips and knees.  Skin: Intact, no ulcerations or rash noted.  Psych: Good eye contact, normal affect. Memory intact not anxious or depressed appearing.  CNS: CN 2-12 intact, power, tone and sensation normal throughout.       Assessment & Plan:

## 2012-06-19 NOTE — Patient Instructions (Addendum)
F/u as before,pls call if you need me before  Medication prescribed for you to insert twice daily  For 5 to 7 days. You have hemmorhoids  Keep bowel movements regular and do not strain at stool

## 2012-06-23 ENCOUNTER — Encounter (HOSPITAL_COMMUNITY): Payer: Medicare Other | Attending: Oncology

## 2012-06-23 DIAGNOSIS — C9110 Chronic lymphocytic leukemia of B-cell type not having achieved remission: Secondary | ICD-10-CM | POA: Insufficient documentation

## 2012-06-23 LAB — COMPREHENSIVE METABOLIC PANEL
CO2: 27 mEq/L (ref 19–32)
Calcium: 9.3 mg/dL (ref 8.4–10.5)
Chloride: 102 mEq/L (ref 96–112)
Creatinine, Ser: 1.36 mg/dL — ABNORMAL HIGH (ref 0.50–1.35)
GFR calc Af Amer: 60 mL/min — ABNORMAL LOW (ref >90)
GFR calc non Af Amer: 52 mL/min — ABNORMAL LOW (ref >90)
Glucose, Bld: 95 mg/dL (ref 70–99)
Total Bilirubin: 0.5 mg/dL (ref 0.3–1.2)

## 2012-06-23 LAB — CBC WITH DIFFERENTIAL/PLATELET
Eosinophils Relative: 16 % — ABNORMAL HIGH (ref 0–5)
HCT: 34.4 % — ABNORMAL LOW (ref 39.0–52.0)
Hemoglobin: 11.6 g/dL — ABNORMAL LOW (ref 13.0–17.0)
Lymphocytes Relative: 40 % (ref 12–46)
Lymphs Abs: 0.9 10*3/uL (ref 0.7–4.0)
MCV: 93.2 fL (ref 78.0–100.0)
Monocytes Absolute: 0.3 10*3/uL (ref 0.1–1.0)
Monocytes Relative: 13 % — ABNORMAL HIGH (ref 3–12)
RBC: 3.69 MIL/uL — ABNORMAL LOW (ref 4.22–5.81)
WBC: 2.3 10*3/uL — ABNORMAL LOW (ref 4.0–10.5)

## 2012-06-23 NOTE — Progress Notes (Signed)
Labs drawn today for cbc/diff,cmp 

## 2012-06-26 NOTE — Assessment & Plan Note (Signed)
Uncontrolled Hyperlipidemia:Low fat diet discussed and encouraged.  Updated lab next visit

## 2012-06-26 NOTE — Assessment & Plan Note (Signed)
Acute flare. Pt ed re need fo soft stool, adequate water intake. Topial prep to promote shrinkage Pt advised to avoid straining and pushing

## 2012-06-26 NOTE — Assessment & Plan Note (Signed)
Improved. Pt applauded on succesful weight loss through lifestyle change, and encouraged to continue same. Weight loss goal set for the next several months.  

## 2012-06-26 NOTE — Assessment & Plan Note (Signed)
Controlled, no change in medication DASH diet and commitment to daily physical activity for a minimum of 30 minutes discussed and encouraged, as a part of hypertension management. The importance of attaining a healthy weight is also discussed.  

## 2012-06-27 LAB — HEMOCCULT GUIAC POC 1CARD (OFFICE)

## 2012-06-27 NOTE — Addendum Note (Signed)
Addended by: Abner Greenspan on: 06/27/2012 10:37 AM   Modules accepted: Orders

## 2012-07-05 ENCOUNTER — Other Ambulatory Visit (HOSPITAL_COMMUNITY): Payer: Self-pay | Admitting: Oncology

## 2012-07-05 DIAGNOSIS — C9110 Chronic lymphocytic leukemia of B-cell type not having achieved remission: Secondary | ICD-10-CM

## 2012-07-05 DIAGNOSIS — C8190 Hodgkin lymphoma, unspecified, unspecified site: Secondary | ICD-10-CM

## 2012-07-05 DIAGNOSIS — D801 Nonfamilial hypogammaglobulinemia: Secondary | ICD-10-CM

## 2012-07-05 MED ORDER — ACYCLOVIR 200 MG PO CAPS: 200.0000 mg | ORAL_CAPSULE | Freq: Two times a day (BID) | ORAL | Status: DC

## 2012-07-13 ENCOUNTER — Encounter (HOSPITAL_COMMUNITY): Payer: Medicare Other | Attending: Oncology | Admitting: Oncology

## 2012-07-13 VITALS — BP 118/68 | HR 80 | Temp 97.7°F | Resp 18 | Wt 223.2 lb

## 2012-07-13 DIAGNOSIS — C8190 Hodgkin lymphoma, unspecified, unspecified site: Secondary | ICD-10-CM

## 2012-07-13 DIAGNOSIS — C9110 Chronic lymphocytic leukemia of B-cell type not having achieved remission: Secondary | ICD-10-CM

## 2012-07-13 DIAGNOSIS — E876 Hypokalemia: Secondary | ICD-10-CM | POA: Insufficient documentation

## 2012-07-13 NOTE — Progress Notes (Signed)
[name], MD [address], [address] [place] [address]  CLL  HODGKIN'S DISEASE  CURRENT THERAPY: To start maintenance Rituxan every 90 days  INTERVAL HISTORY: [name] 67 y.o. male returns for  regular  visit for followup of Recurrent chronic lymphocytic leukemia. S/P 6 cycles of Bendamustine/Rituxan chemotherapy from 10/13/2011- 03/17/2012.   Truth has not yet started the maintenance Rituxan program which will include Rituxan every 90 days. We'll start that next week in 07/18/2012. He will take that for 2 years total.  Jencarlo had to reschedule his oral surgeon appointment that was suppose to be today do to his wife requiring an appointment in [facility] for followup of her cancer care. This appointment is now rescheduled for 08/03/2012. As a result, we'll push forward with maintenance Rituxan.  We spent some time discussing the risks, benefits, alternatives, and side effects of Rituxan therapy. He is agreeable to this plan.  We'll perform prechemotherapy laboratory work as mentioned below.  Hematologically, the patient denies any complaints of ROS questioning is negative.  Past Medical History  Diagnosis Date  . Aortic stenosis   . Atrial fibrillation   . Coronary atherosclerosis of native coronary artery   . Hyperlipidemia   . Essential hypertension, benign   . BPH (benign prostatic hypertrophy)   . Elevated WBC count   . Arthritis   . Acid reflux   . Hypogammaglobulinemia   . Pre-diabetes 08/04/2011  . CLL (chronic lymphoblastic leukemia)   . Hodgkin's disease(201)   . Kidney stones     has HODGKIN'S DISEASE; CLL; HYPERLIPIDEMIA; OBESITY; CORONARY ATHEROSCLEROSIS NATIVE CORONARY ARTERY; AORTIC STENOSIS; Atrial fibrillation; OTHER DYSPHAGIA; AORTIC VALVE REPLACEMENT, HX OF; Essential hypertension, benign; GERD (gastroesophageal reflux disease); Thrombocytopenia; Hypogammaglobulinemia, acquired; Anemia; Tachyarrhythmia; Pre-diabetes; UTI (lower urinary  tract infection); Dental infection; and Piles (hemorrhoids) on his problem list.     has No Known Allergies.  Mr. Ferrebee had no medications administered during this visit.  Past Surgical History  Procedure Laterality Date  . Eye surgery    . 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 Esophagogastroduodenoscopy      scrapping of throat and stretching  . Lithotripsy    . Bone marrow aspiration    . Portacath placement    . 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 Colon surgery      Denies any headaches, dizziness, double vision, fevers, chills, night sweats, nausea, vomiting, diarrhea, constipation, chest pain, heart palpitations, shortness of breath, blood in stool, black tarry stool, urinary pain, urinary burning, urinary frequency, hematuria.   PHYSICAL EXAMINATION  ECOG PERFORMANCE STATUS: 1 - Symptomatic but completely ambulatory  Filed Vitals:   07/13/12 1012  BP: 118/68  Pulse: 80  Temp: 97.7 F (36.5 C)  Resp: 18    GENERAL:alert, no distress, well nourished, well developed, comfortable, cooperative, obese and smiling SKIN: skin color, texture, turgor are normal, no rashes or significant lesions HEAD: Normocephalic, No masses, lesions, tenderness or abnormalities EYES: normal, Conjunctiva are pink and non-injected EARS: External ears normal OROPHARYNX:mucous membranes are moist  NECK: supple, no adenopathy, thyroid normal size, non-tender, without nodularity, no stridor, non-tender, trachea midline LYMPH:  no palpable lymphadenopathy, no hepatosplenomegaly BREAST:not examined LUNGS: clear to auscultation and percussion HEART: regular rate & rhythm, no gallops, S1 normal and S2 normal, 2/6 systolic murmur that is musical in nature and is best heard at the LSB 2nd and 5th ICS. ABDOMEN:abdomen soft,  non-tender, obese, normal bowel sounds, no masses or organomegaly and no hepatosplenomegaly BACK: Back symmetric, no  curvature. EXTREMITIES:less then 2 second capillary refill, no joint deformities, effusion, or inflammation, no skin discoloration, no clubbing, no cyanosis  NEURO: alert & oriented x 3 with fluent speech, no focal motor/sensory deficits, gait normal   LABORATORY DATA: CBC    Component Value Date/Time   WBC 2.3* 06/23/2012 1012   RBC 3.69* 06/23/2012 1012   HGB 11.6* 06/23/2012 1012   HCT 34.4* 06/23/2012 1012   PLT 126* 06/23/2012 1012   MCV 93.2 06/23/2012 1012   MCH 31.4 06/23/2012 1012   MCHC 33.7 06/23/2012 1012   RDW 16.7* 06/23/2012 1012   LYMPHSABS 0.9 06/23/2012 1012   MONOABS 0.3 06/23/2012 1012   EOSABS 0.4 06/23/2012 1012   BASOSABS 0.0 06/23/2012 1012      Chemistry      Component Value Date/Time   NA 139 06/23/2012 1012   K 3.7 06/23/2012 1012   CL 102 06/23/2012 1012   CO2 27 06/23/2012 1012   BUN 10 06/23/2012 1012   CREATININE 1.36* 06/23/2012 1012   CREATININE 1.29 05/10/2011 1541      Component Value Date/Time   CALCIUM 9.3 06/23/2012 1012   ALKPHOS 110 06/23/2012 1012   AST 31 06/23/2012 1012   ALT 29 06/23/2012 1012   BILITOT 0.5 06/23/2012 1012        ASSESSMENT:  1. Recurrent chronic lymphocytic leukemia. S/P 6 cycles of Bendamustine/Rituxan chemotherapy from 10/13/2011- 03/17/2012.  2. 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 He is in a complete remission from the Hodgkin's.  3. Status post fludarabine, Cytoxan, and rituximab therapy for 3 cycles when he presented with CLL in 2004.  4. Obesity.  5. Insect bites, multiple places on his body.  6. Possible early onset diabetes mellitus with mild elevation in his sugars lately. We will check hemoglobin A1c next time he is here.  7. Pan-hypogammaglobulinemia.  8. Severe deconditioning.  9. History of Agent Orange exposure  during the [place] War and he states that just within the last 2 months he has been granted full disability from the [place].  10.Coronary artery disease, status post bypass grafting x1 in October 2009.  11.Peripheral vascular disease.  12.Aortic stenosis with aortic valve replacement.  13.Benign prostatic hypertrophy.  14.Hyperlipidemia.  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  16.Circumcision in the past, leaving him with difficulty with a uniform stream and occasional incontinence.   Patient Active Problem List  Diagnosis  . HODGKIN'S DISEASE  . CLL  . HYPERLIPIDEMIA  . OBESITY  . CORONARY ATHEROSCLEROSIS NATIVE CORONARY ARTERY  . AORTIC STENOSIS  . Atrial fibrillation  . OTHER DYSPHAGIA  . AORTIC VALVE REPLACEMENT, HX OF  . Essential hypertension, benign  . GERD (gastroesophageal reflux disease)  . Thrombocytopenia  . Hypogammaglobulinemia, acquired  . Anemia  . Tachyarrhythmia  . Pre-diabetes  . UTI (lower urinary tract infection)  . Dental infection  . Piles (hemorrhoids)      PLAN:  1. I personally reviewed and went over laboratory results with the patient. 2. Rituxan every 90 days  3. Discussed the risks, benefits, alternatives, and side effects to maintenance Rituxan every 90 days. 4. We will start Rituxan maintenance on 07/18/2012. 5. Patient will followup with oral  surgeon as scheduled on 08/03/2012 5. Pre-chemo labs: CBC diff, CMET, LDH 6. CT scans as scheduled in June 2014. 7. Return as scheduled in June 20 14th on CT scans for followup.  All questions were answered. The patient knows to call the clinic with any problems, questions or concerns. We can certainly see the patient much sooner if necessary.  Patient and plan will be discussed with Dr. [name] within the next 24 hours.   KEFALAS,THOMAS

## 2012-07-13 NOTE — Patient Instructions (Addendum)
.  [facility] Discharge Instructions  RECOMMENDATIONS MADE BY THE CONSULTANT AND ANY TEST RESULTS WILL BE SENT TO YOUR REFERRING PHYSICIAN.  EXAM FINDINGS BY THE PHYSICIAN TODAY AND SIGNS OR SYMPTOMS TO REPORT TO CLINIC OR PRIMARY PHYSICIAN: We will start maintenance therapy with Rituxan and you will take that for 2 years- one treatment every 12 weeks  MEDICATIONS PRESCRIBED:  Take your tylenol and benadryl before you come  INSTRUCTIONS GIVEN AND DISCUSSED: Ct scans in June as scheduled  SPECIAL INSTRUCTIONS/FOLLOW-UP: [place] as scheduled in June  Thank you for choosing [facility] to provide your oncology and hematology care.  To afford each patient quality time with our providers, please arrive at least 15 minutes before your scheduled appointment time.  With your help, our goal is to use those 15 minutes to complete the necessary work-up to ensure our physicians have the information they need to help with your evaluation and healthcare recommendations.    Effective January 1st, 2014, we ask that you re-schedule your appointment with our physicians should you arrive 10 or more minutes late for your appointment.  We strive to give you quality time with our providers, and arriving late affects you and other patients whose appointments are after yours.    Again, thank you for choosing [facility].  Our hope is that these requests will decrease the amount of time that you wait before being seen by our physicians.       _____________________________________________________________  Should you have questions after your visit to [facility], please contact our office at [phone] between the hours of 8:30 a.m. and 5:00 p.m.  Voicemails left after 4:30 p.m. will not be returned until the following business day.  For prescription refill requests, have your pharmacy contact our office with your prescription refill request.

## 2012-07-18 ENCOUNTER — Encounter (HOSPITAL_BASED_OUTPATIENT_CLINIC_OR_DEPARTMENT_OTHER): Payer: Medicare Other

## 2012-07-18 VITALS — BP 145/62 | HR 70 | Temp 97.8°F | Resp 20 | Wt 221.6 lb

## 2012-07-18 DIAGNOSIS — E876 Hypokalemia: Secondary | ICD-10-CM

## 2012-07-18 DIAGNOSIS — Z5112 Encounter for antineoplastic immunotherapy: Secondary | ICD-10-CM

## 2012-07-18 DIAGNOSIS — C9110 Chronic lymphocytic leukemia of B-cell type not having achieved remission: Secondary | ICD-10-CM

## 2012-07-18 LAB — CBC WITH DIFFERENTIAL/PLATELET
Basophils Absolute: 0 10*3/uL (ref 0.0–0.1)
Eosinophils Relative: 16 % — ABNORMAL HIGH (ref 0–5)
HCT: 28.5 % — ABNORMAL LOW (ref 39.0–52.0)
Hemoglobin: 9.9 g/dL — ABNORMAL LOW (ref 13.0–17.0)
Lymphocytes Relative: 30 % (ref 12–46)
MCV: 92.5 fL (ref 78.0–100.0)
Monocytes Absolute: 0.3 10*3/uL (ref 0.1–1.0)
Monocytes Relative: 21 % — ABNORMAL HIGH (ref 3–12)
Neutro Abs: 0.5 10*3/uL — ABNORMAL LOW (ref 1.7–7.7)
RDW: 17.1 % — ABNORMAL HIGH (ref 11.5–15.5)
WBC: 1.6 10*3/uL — ABNORMAL LOW (ref 4.0–10.5)

## 2012-07-18 LAB — COMPREHENSIVE METABOLIC PANEL
AST: 19 U/L (ref 0–37)
BUN: 8 mg/dL (ref 6–23)
CO2: 27 mEq/L (ref 19–32)
Calcium: 8.8 mg/dL (ref 8.4–10.5)
Chloride: 104 mEq/L (ref 96–112)
Creatinine, Ser: 1.1 mg/dL (ref 0.50–1.35)
GFR calc Af Amer: 78 mL/min — ABNORMAL LOW (ref >90)
GFR calc non Af Amer: 67 mL/min — ABNORMAL LOW (ref >90)
Glucose, Bld: 98 mg/dL (ref 70–99)
Total Bilirubin: 0.6 mg/dL (ref 0.3–1.2)

## 2012-07-18 LAB — LACTATE DEHYDROGENASE: LDH: 250 U/L (ref 94–250)

## 2012-07-18 MED ORDER — SODIUM CHLORIDE 0.9 % IV SOLN
Freq: Once | INTRAVENOUS | Status: AC
  Administered 2012-07-18: 10:00:00 via INTRAVENOUS

## 2012-07-18 MED ORDER — HEPARIN SOD (PORK) LOCK FLUSH 100 UNIT/ML IV SOLN
INTRAVENOUS | Status: AC
  Filled 2012-07-18: qty 5

## 2012-07-18 MED ORDER — SODIUM CHLORIDE 0.9 % IV SOLN
500.0000 mg/m2 | Freq: Once | INTRAVENOUS | Status: AC
  Administered 2012-07-18: 1100 mg via INTRAVENOUS
  Filled 2012-07-18: qty 110

## 2012-07-18 MED ORDER — HEPARIN SOD (PORK) LOCK FLUSH 100 UNIT/ML IV SOLN
500.0000 [IU] | Freq: Once | INTRAVENOUS | Status: AC | PRN
  Administered 2012-07-18: 500 [IU]
  Filled 2012-07-18: qty 5

## 2012-07-18 NOTE — Progress Notes (Signed)
Tolerated well

## 2012-07-28 ENCOUNTER — Encounter (HOSPITAL_COMMUNITY): Payer: Medicare Other | Attending: Oncology

## 2012-07-28 DIAGNOSIS — E876 Hypokalemia: Secondary | ICD-10-CM | POA: Insufficient documentation

## 2012-07-28 DIAGNOSIS — C8190 Hodgkin lymphoma, unspecified, unspecified site: Secondary | ICD-10-CM

## 2012-07-28 DIAGNOSIS — C9110 Chronic lymphocytic leukemia of B-cell type not having achieved remission: Secondary | ICD-10-CM | POA: Insufficient documentation

## 2012-07-28 LAB — POTASSIUM: Potassium: 4.3 mEq/L (ref 3.5–5.1)

## 2012-07-28 MED ORDER — SULFAMETHOXAZOLE-TRIMETHOPRIM 800-160 MG PO TABS: 1.0000 | ORAL_TABLET | Freq: Every day | ORAL | Status: DC

## 2012-07-28 NOTE — Progress Notes (Signed)
Labs drawn today for K

## 2012-08-07 ENCOUNTER — Other Ambulatory Visit (HOSPITAL_COMMUNITY): Payer: Self-pay | Admitting: Oncology

## 2012-08-13 ENCOUNTER — Emergency Department (HOSPITAL_COMMUNITY)
Admission: EM | Admit: 2012-08-13 | Discharge: 2012-08-13 | Disposition: A | Discharge status: Home / Self Care | Payer: Medicare Other | Attending: Emergency Medicine | Admitting: Emergency Medicine

## 2012-08-13 ENCOUNTER — Encounter (HOSPITAL_COMMUNITY): Payer: Self-pay | Admitting: *Deleted

## 2012-08-13 DIAGNOSIS — K1370 Unspecified lesions of oral mucosa: Secondary | ICD-10-CM | POA: Insufficient documentation

## 2012-08-13 DIAGNOSIS — K0889 Other specified disorders of teeth and supporting structures: Secondary | ICD-10-CM

## 2012-08-13 DIAGNOSIS — I10 Essential (primary) hypertension: Secondary | ICD-10-CM | POA: Insufficient documentation

## 2012-08-13 DIAGNOSIS — R5381 Other malaise: Secondary | ICD-10-CM | POA: Insufficient documentation

## 2012-08-13 DIAGNOSIS — Z862 Personal history of diseases of the blood and blood-forming organs and certain disorders involving the immune mechanism: Secondary | ICD-10-CM | POA: Insufficient documentation

## 2012-08-13 DIAGNOSIS — Z856 Personal history of leukemia: Secondary | ICD-10-CM | POA: Insufficient documentation

## 2012-08-13 DIAGNOSIS — E785 Hyperlipidemia, unspecified: Secondary | ICD-10-CM | POA: Insufficient documentation

## 2012-08-13 DIAGNOSIS — N400 Enlarged prostate without lower urinary tract symptoms: Secondary | ICD-10-CM | POA: Insufficient documentation

## 2012-08-13 DIAGNOSIS — K089 Disorder of teeth and supporting structures, unspecified: Secondary | ICD-10-CM | POA: Insufficient documentation

## 2012-08-13 DIAGNOSIS — R630 Anorexia: Secondary | ICD-10-CM | POA: Insufficient documentation

## 2012-08-13 DIAGNOSIS — Z79899 Other long term (current) drug therapy: Secondary | ICD-10-CM | POA: Insufficient documentation

## 2012-08-13 DIAGNOSIS — Z951 Presence of aortocoronary bypass graft: Secondary | ICD-10-CM | POA: Insufficient documentation

## 2012-08-13 DIAGNOSIS — Z7982 Long term (current) use of aspirin: Secondary | ICD-10-CM | POA: Insufficient documentation

## 2012-08-13 DIAGNOSIS — Z87442 Personal history of urinary calculi: Secondary | ICD-10-CM | POA: Insufficient documentation

## 2012-08-13 DIAGNOSIS — I2510 Atherosclerotic heart disease of native coronary artery without angina pectoris: Secondary | ICD-10-CM | POA: Insufficient documentation

## 2012-08-13 DIAGNOSIS — Z8679 Personal history of other diseases of the circulatory system: Secondary | ICD-10-CM | POA: Insufficient documentation

## 2012-08-13 NOTE — ED Provider Notes (Signed)
History     CSN: [id number]  Arrival date & time 08/13/12  1330   First MD Initiated Contact with Patient 08/13/12 1345      Chief Complaint  Patient presents with  . Dental Pain     Patient is a 67 y.o. male presenting with tooth pain. The history is provided by the patient and a relative.  Dental PainThe primary symptoms include mouth pain. Primary symptoms do not include fever or shortness of breath. The symptoms began more than 1 week ago. The symptoms are worsening. The symptoms occur constantly.  Additional symptoms include: fatigue. Additional symptoms do not include: drooling.    Past Medical History  Diagnosis Date  . Aortic stenosis   . Atrial fibrillation   . Coronary atherosclerosis of native coronary artery   . Hyperlipidemia   . Essential hypertension, benign   . BPH (benign prostatic hypertrophy)   . Elevated WBC count   . Arthritis   . Acid reflux   . Hypogammaglobulinemia   . Pre-diabetes 08/04/2011  . CLL (chronic lymphoblastic leukemia)   . Hodgkin's disease(201)   . Kidney stones     Past Surgical History  Procedure Laterality Date  . Eye surgery    . 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 Esophagogastroduodenoscopy      scrapping of throat and stretching  . Lithotripsy    . Bone marrow aspiration    . Portacath placement    . 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 Colon surgery      Family History  Problem Relation Age of Onset  . Heart failure Mother   . Blindness Mother   . Cancer Father     Lung     History  Substance Use Topics  . Smoking status: Never Smoker   . Smokeless tobacco: Never Used     Comment: pt denies tobacco use   . Alcohol Use: No     Comment: pt denies alcohol       Review of Systems  Constitutional: Positive for appetite change and fatigue. Negative for fever.  HENT: Negative for drooling.   Respiratory: Negative for shortness of breath.   Cardiovascular: Negative  for chest pain.    Allergies  Review of patient's allergies indicates no known allergies.  Home Medications   Current Outpatient Rx  Name  Route  Sig  Dispense  Refill  . acyclovir (ZOVIRAX) 200 MG capsule   Oral   Take 1 capsule (200 mg total) by mouth 2 (two) times daily.   60 capsule   2   . amoxicillin (AMOXIL) 500 MG capsule   Oral   Take 500 mg by mouth 3 (three) times daily.         Marland Kitchen aspirin EC 81 MG tablet   Oral   Take 81 mg by mouth daily.         . diphenhydrAMINE (BENADRYL) 25 MG tablet   Oral   Take 50 mg by mouth. Take 2 tablets (=50mg ) 1 hour prior to Rituxan.         . folic acid (FOLVITE) 1 MG tablet   Oral   Take 1 mg by mouth daily.         Marland Kitchen HYDROcodone-acetaminophen (NORCO/VICODIN) 5-325 MG per tablet   Oral   Take 1 tablet by mouth every 8 (eight) hours as needed for pain.         Marland Kitchen  LORazepam (ATIVAN) 1 MG tablet      1 mg every 8 (eight) hours as needed (nausea/vomiting).          . metoprolol (LOPRESSOR) 50 MG tablet   Oral   Take 50 mg by mouth 2 (two) times daily.         . Multiple Vitamins-Minerals (CENTRUM SILVER ULTRA MENS PO)   Oral   Take 1 tablet by mouth every morning.          Marland Kitchen OMEGA 3 1000 MG CAPS   Oral   Take 1,000 mg by mouth every morning.          Marland Kitchen omeprazole (PRILOSEC) 20 MG capsule   Oral   Take 20 mg by mouth every morning.          . ondansetron (ZOFRAN) 8 MG tablet      Take 1 tablet two times a day starting the day after chemo for 2 days. Then take 1 tablet two times a day as needed for nausea or vomiting.   30 tablet   1   . RiTUXimab (RITUXAN IV)   Intravenous   Inject into the vein every 28 (twenty-eight) days.         . rosuvastatin (CRESTOR) 10 MG tablet   Oral   Take 1 tablet (10 mg total) by mouth daily.   90 tablet   3   . sulfamethoxazole-trimethoprim (BACTRIM DS,SEPTRA DS) 800-160 MG per tablet   Oral   Take 1 tablet by mouth daily. Taking Mondays, Wednesdays and  Fridays   30 tablet   0   . acetaminophen (TYLENOL) 325 MG tablet   Oral   Take 650 mg by mouth once. Take 2 tablets (=650mg ) 1 hour prior to Rituxan.           BP 98/67  Pulse 89  Temp(Src) 99 F (37.2 C) (Oral)  Resp 17  Ht 5\' 10"  (1.778 m)  Wt 210 lb (95.255 kg)  BMI 30.13 kg/m2  SpO2 99%  Physical Exam CONSTITUTIONAL: Well developed/well nourished HEAD AND FACE: Normocephalic/atraumatic EYES: EOMI/PERRL ENMT: Mucous membranes moist.  Poor dentition.  No trismus.  No focal abscess noted. NECK: supple no meningeal signs LUNGS: Lungs are clear to auscultation bilaterally, no apparent distress ABDOMEN: soft, nontender, no rebound or guarding NEURO: Pt is awake/alert, moves all extremitiesx4, he is ambulatory EXTREMITIES:full ROM SKIN: warm, color normal  ED Course  Procedures  1. Pain, dental     Pt reports continued dental pain.  He is already on amoxicillin.  Also, he has pain meds at home but has not taken them today due to making him feel "loopy" He has had no fever or vomiting.  He reports mild fatigue and decreased appetite due to dental pain.  He already has appt early in June for dental extraction.  He has h/o leukemia but he feels this is at his baseline and not acutely changed  MDM  Nursing notes including past medical history and social history reviewed and considered in documentation Previous records reviewed and considered         Joya Gaskins, MD 08/13/12 1506

## 2012-08-13 NOTE — ED Notes (Addendum)
Pt c/o toothache that started "awhile back", is scheduled to see oral surgeon June 3rd. But pt states that the pain is too bad and it hurts for him to eat. Pt states that he is currently taking antibiotics and pain medication for his teeth.

## 2012-08-15 ENCOUNTER — Other Ambulatory Visit (HOSPITAL_COMMUNITY): Payer: Self-pay | Admitting: *Deleted

## 2012-08-15 DIAGNOSIS — C9110 Chronic lymphocytic leukemia of B-cell type not having achieved remission: Secondary | ICD-10-CM

## 2012-08-16 ENCOUNTER — Encounter (HOSPITAL_BASED_OUTPATIENT_CLINIC_OR_DEPARTMENT_OTHER): Payer: Medicare Other

## 2012-08-16 DIAGNOSIS — C9110 Chronic lymphocytic leukemia of B-cell type not having achieved remission: Secondary | ICD-10-CM

## 2012-08-16 DIAGNOSIS — C8190 Hodgkin lymphoma, unspecified, unspecified site: Secondary | ICD-10-CM

## 2012-08-16 LAB — BASIC METABOLIC PANEL WITH GFR
BUN: 13 mg/dL (ref 6–23)
CO2: 26 meq/L (ref 19–32)
Calcium: 9.2 mg/dL (ref 8.4–10.5)
Chloride: 96 meq/L (ref 96–112)
Creatinine, Ser: 1.19 mg/dL (ref 0.50–1.35)
GFR calc Af Amer: 71 mL/min — ABNORMAL LOW
GFR calc non Af Amer: 61 mL/min — ABNORMAL LOW
Glucose, Bld: 123 mg/dL — ABNORMAL HIGH (ref 70–99)
Potassium: 3.9 meq/L (ref 3.5–5.1)
Sodium: 133 meq/L — ABNORMAL LOW (ref 135–145)

## 2012-08-16 LAB — CBC WITH DIFFERENTIAL/PLATELET
Basophils Absolute: 0 10*3/uL (ref 0.0–0.1)
Basophils Relative: 1 % (ref 0–1)
HCT: 33.7 % — ABNORMAL LOW (ref 39.0–52.0)
Lymphocytes Relative: 41 % (ref 12–46)
MCHC: 33.8 g/dL (ref 30.0–36.0)
Monocytes Absolute: 0.3 10*3/uL (ref 0.1–1.0)
Neutro Abs: 0.4 10*3/uL — ABNORMAL LOW (ref 1.7–7.7)
Neutrophils Relative %: 29 % — ABNORMAL LOW (ref 43–77)
Platelets: 104 10*3/uL — ABNORMAL LOW (ref 150–400)
RDW: 16.1 % — ABNORMAL HIGH (ref 11.5–15.5)
WBC: 1.5 10*3/uL — ABNORMAL LOW (ref 4.0–10.5)

## 2012-08-16 NOTE — Progress Notes (Signed)
Labs drawn today for cbc/diff,bmp 

## 2012-08-18 ENCOUNTER — Other Ambulatory Visit (HOSPITAL_COMMUNITY): Payer: Self-pay | Admitting: Oncology

## 2012-08-18 ENCOUNTER — Telehealth (HOSPITAL_COMMUNITY): Payer: Self-pay | Admitting: Oncology

## 2012-08-18 DIAGNOSIS — C9110 Chronic lymphocytic leukemia of B-cell type not having achieved remission: Secondary | ICD-10-CM

## 2012-08-18 DIAGNOSIS — C8190 Hodgkin lymphoma, unspecified, unspecified site: Secondary | ICD-10-CM

## 2012-08-18 MED ORDER — FIRST-DUKES MOUTHWASH MT SUSP: 5.0000 mL | Freq: Four times a day (QID) | OROMUCOSAL | Status: DC | PRN

## 2012-08-26 ENCOUNTER — Emergency Department (HOSPITAL_COMMUNITY)
Admission: EM | Admit: 2012-08-26 | Discharge: 2012-08-26 | Disposition: A | Discharge status: Home / Self Care | Payer: Medicare Other | Attending: Emergency Medicine | Admitting: Emergency Medicine

## 2012-08-26 ENCOUNTER — Encounter (HOSPITAL_COMMUNITY): Payer: Self-pay | Admitting: *Deleted

## 2012-08-26 DIAGNOSIS — I10 Essential (primary) hypertension: Secondary | ICD-10-CM | POA: Insufficient documentation

## 2012-08-26 DIAGNOSIS — Z7982 Long term (current) use of aspirin: Secondary | ICD-10-CM | POA: Insufficient documentation

## 2012-08-26 DIAGNOSIS — C9110 Chronic lymphocytic leukemia of B-cell type not having achieved remission: Secondary | ICD-10-CM | POA: Insufficient documentation

## 2012-08-26 DIAGNOSIS — I4891 Unspecified atrial fibrillation: Secondary | ICD-10-CM | POA: Insufficient documentation

## 2012-08-26 DIAGNOSIS — Z862 Personal history of diseases of the blood and blood-forming organs and certain disorders involving the immune mechanism: Secondary | ICD-10-CM | POA: Insufficient documentation

## 2012-08-26 DIAGNOSIS — Z79899 Other long term (current) drug therapy: Secondary | ICD-10-CM | POA: Insufficient documentation

## 2012-08-26 DIAGNOSIS — R6889 Other general symptoms and signs: Secondary | ICD-10-CM | POA: Insufficient documentation

## 2012-08-26 DIAGNOSIS — Z87448 Personal history of other diseases of urinary system: Secondary | ICD-10-CM | POA: Insufficient documentation

## 2012-08-26 DIAGNOSIS — R079 Chest pain, unspecified: Secondary | ICD-10-CM | POA: Insufficient documentation

## 2012-08-26 DIAGNOSIS — Z8639 Personal history of other endocrine, nutritional and metabolic disease: Secondary | ICD-10-CM | POA: Insufficient documentation

## 2012-08-26 DIAGNOSIS — E785 Hyperlipidemia, unspecified: Secondary | ICD-10-CM | POA: Insufficient documentation

## 2012-08-26 DIAGNOSIS — K219 Gastro-esophageal reflux disease without esophagitis: Secondary | ICD-10-CM | POA: Insufficient documentation

## 2012-08-26 DIAGNOSIS — R002 Palpitations: Secondary | ICD-10-CM | POA: Insufficient documentation

## 2012-08-26 DIAGNOSIS — A691 Other Vincent's infections: Secondary | ICD-10-CM

## 2012-08-26 DIAGNOSIS — Z8679 Personal history of other diseases of the circulatory system: Secondary | ICD-10-CM | POA: Insufficient documentation

## 2012-08-26 DIAGNOSIS — R011 Cardiac murmur, unspecified: Secondary | ICD-10-CM | POA: Insufficient documentation

## 2012-08-26 DIAGNOSIS — C8198 Hodgkin lymphoma, unspecified, lymph nodes of multiple sites: Secondary | ICD-10-CM | POA: Insufficient documentation

## 2012-08-26 DIAGNOSIS — M129 Arthropathy, unspecified: Secondary | ICD-10-CM | POA: Insufficient documentation

## 2012-08-26 DIAGNOSIS — Z951 Presence of aortocoronary bypass graft: Secondary | ICD-10-CM | POA: Insufficient documentation

## 2012-08-26 DIAGNOSIS — Z87442 Personal history of urinary calculi: Secondary | ICD-10-CM | POA: Insufficient documentation

## 2012-08-26 DIAGNOSIS — I2510 Atherosclerotic heart disease of native coronary artery without angina pectoris: Secondary | ICD-10-CM | POA: Insufficient documentation

## 2012-08-26 LAB — CBC WITH DIFFERENTIAL/PLATELET
Basophils Absolute: 0 10*3/uL (ref 0.0–0.1)
Eosinophils Relative: 9 % — ABNORMAL HIGH (ref 0–5)
Lymphocytes Relative: 23 % (ref 12–46)
Lymphs Abs: 0.3 10*3/uL — ABNORMAL LOW (ref 0.7–4.0)
Neutrophils Relative %: 41 % — ABNORMAL LOW (ref 43–77)
Platelets: 107 10*3/uL — ABNORMAL LOW (ref 150–400)
RBC: 3.59 MIL/uL — ABNORMAL LOW (ref 4.22–5.81)
RDW: 15.8 % — ABNORMAL HIGH (ref 11.5–15.5)
WBC: 1.4 10*3/uL — CL (ref 4.0–10.5)

## 2012-08-26 LAB — BASIC METABOLIC PANEL
CO2: 25 mEq/L (ref 19–32)
Calcium: 8.7 mg/dL (ref 8.4–10.5)
GFR calc non Af Amer: 64 mL/min — ABNORMAL LOW (ref >90)
Glucose, Bld: 112 mg/dL — ABNORMAL HIGH (ref 70–99)
Potassium: 3.5 mEq/L (ref 3.5–5.1)
Sodium: 132 mEq/L — ABNORMAL LOW (ref 135–145)

## 2012-08-26 MED ORDER — CLINDAMYCIN HCL 150 MG PO CAPS
300.0000 mg | ORAL_CAPSULE | Freq: Once | ORAL | Status: AC
  Administered 2012-08-26: 300 mg via ORAL
  Filled 2012-08-26: qty 2

## 2012-08-26 MED ORDER — CLINDAMYCIN HCL 150 MG PO CAPS: 150.0000 mg | ORAL_CAPSULE | Freq: Three times a day (TID) | ORAL | Status: DC

## 2012-08-26 MED ORDER — ONDANSETRON 4 MG PO TBDP
4.0000 mg | ORAL_TABLET | Freq: Once | ORAL | Status: AC
  Administered 2012-08-26: 4 mg via ORAL
  Filled 2012-08-26: qty 1

## 2012-08-26 MED ORDER — LIDOCAINE VISCOUS 2 % MT SOLN
20.0000 mL | Freq: Once | OROMUCOSAL | Status: AC
  Administered 2012-08-26: 20 mL via OROMUCOSAL
  Filled 2012-08-26: qty 30

## 2012-08-26 MED ORDER — SODIUM CHLORIDE 0.9 % IV SOLN
Freq: Once | INTRAVENOUS | Status: AC
  Administered 2012-08-26: 11:00:00 via INTRAVENOUS

## 2012-08-26 MED ORDER — NYSTATIN 100000 UNIT/ML MT SUSP: OROMUCOSAL | Status: DC

## 2012-08-26 NOTE — ED Provider Notes (Signed)
Pt presents with worsening pain in the mouth and throat - on Chemo for CA - magic mouthwash with insufficient relief.  On exam has several discrete apthous lesions along the gums, the lips and a large white patch on the posterior palate.  Otherwise in no acute distress no trisnus and no swelling.  ANUG likely - relatively immunocompromised - nystatin wash, clinda - f/u closely.  Airway intact.  Medical screening examination/treatment/procedure(s) were conducted as a shared visit with non-physician practitioner(s) and myself.  I personally evaluated the patient during the encounter    Vida Roller, MD 08/26/12 1031

## 2012-08-26 NOTE — ED Notes (Signed)
CRITICAL VALUE ALERT  Critical value received:  WBC 1.4  Date of notification:  08/26/2012  Time of notification:  1056  Critical value read back:yes  Nurse who received alert:  Tarri Glenn RN  PA notified (1st page):  Ivery Quale PA  Time of first page:  1056  MD notified (2nd page):  Time of second page:  Responding PA:  Ivery Quale PA  Time MD responded:  [phone]

## 2012-08-26 NOTE — ED Provider Notes (Signed)
History     CSN: [id number]  Arrival date & time 08/26/12  4098   First MD Initiated Contact with Patient 08/26/12 [phone]      Chief Complaint  Patient presents with  . Mouth Lesions    (Consider location/radiation/quality/duration/timing/severity/associated sxs/prior treatment) HPI Comments: Patient is a 67 year old male who unfortunately suffers from chronic lymphocytic leukemia, Hodgkin's disease, aortic stenosis, atrial fibrillation, coronary artery disease, who has been treated with chemotherapy. The last dose was approximately 3 weeks ago. The patient's wife is the primary historian, as patient is having problems verbalizing due to his mouth pain. The wife states that the patient has not been healing well for approximately 3 weeks, with only drinking an occasional boost a meal or cold small bowel were. The patient is experiencing lesions of the mouth as well as soreness of the mouth. There's been no fever. Has been no nausea vomiting reported. The patient is scheduled to have the remaining teeth of the lower jaw removed on Tuesday, June 3. The patient and the patient's wife are concerned for possible dehydration. And also to see if another medication could be used to help him with this oral infection. He has tried Magic mouthwash, but this has been non- effective.  Patient is a 67 y.o. male presenting with mouth sores. The history is provided by the patient.  Mouth Lesions Associated symptoms: no neck pain     Past Medical History  Diagnosis Date  . Aortic stenosis   . Atrial fibrillation   . Coronary atherosclerosis of native coronary artery   . Hyperlipidemia   . Essential hypertension, benign   . BPH (benign prostatic hypertrophy)   . Elevated WBC count   . Arthritis   . Acid reflux   . Hypogammaglobulinemia   . Pre-diabetes 08/04/2011  . CLL (chronic lymphoblastic leukemia)   . Hodgkin's disease   . Kidney stones     Past Surgical History  Procedure Laterality Date  .  Eye surgery    . 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 Esophagogastroduodenoscopy      scrapping of throat and stretching  . Lithotripsy    . Bone marrow aspiration    . Portacath placement    . 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 Colon surgery      Family History  Problem Relation Age of Onset  . Heart failure Mother   . Blindness Mother   . Cancer Father     Lung     History  Substance Use Topics  . Smoking status: Never Smoker   . Smokeless tobacco: Never Used     Comment: pt denies tobacco use   . Alcohol Use: No     Comment: pt denies alcohol       Review of Systems  Constitutional: Negative for activity change.       All ROS Neg except as noted in HPI  HENT: Positive for mouth sores and dental problem. Negative for nosebleeds and neck pain.   Eyes: Negative for photophobia and discharge.  Respiratory: Negative for cough, shortness of breath and wheezing.   Cardiovascular: Positive for chest pain and palpitations.  Gastrointestinal: Negative for abdominal pain and blood in stool.  Genitourinary: Negative for dysuria, frequency and hematuria.  Musculoskeletal: Positive for arthralgias. Negative for back pain.  Skin: Negative.   Neurological: Negative for dizziness, seizures and speech difficulty.  Psychiatric/Behavioral: Negative for hallucinations and confusion.  Allergies  Review of patient's allergies indicates no known allergies.  Home Medications   Current Outpatient Rx  Name  Route  Sig  Dispense  Refill  . acetaminophen (TYLENOL) 325 MG tablet   Oral   Take 650 mg by mouth once. Take 2 tablets (=650mg ) 1 hour prior to Rituxan.         Marland Kitchen acyclovir (ZOVIRAX) 200 MG capsule   Oral   Take 1 capsule (200 mg total) by mouth 2 (two) times daily.   60 capsule   2   . amoxicillin (AMOXIL) 500 MG capsule   Oral   Take 500 mg by mouth 3 (three) times daily.         Marland Kitchen aspirin EC 81 MG tablet   Oral    Take 81 mg by mouth daily.         . Diphenhyd-Hydrocort-Nystatin (FIRST-DUKES MOUTHWASH) SUSP   Mouth/Throat   Use as directed 5 mLs in the mouth or throat 4 (four) times daily as needed (Thrush).   300 mL   1   . diphenhydrAMINE (BENADRYL) 25 MG tablet   Oral   Take 50 mg by mouth. Take 2 tablets (=50mg ) 1 hour prior to Rituxan.         . folic acid (FOLVITE) 1 MG tablet   Oral   Take 1 mg by mouth daily.         Marland Kitchen HYDROcodone-acetaminophen (NORCO/VICODIN) 5-325 MG per tablet   Oral   Take 1 tablet by mouth every 8 (eight) hours as needed for pain.         Marland Kitchen LORazepam (ATIVAN) 1 MG tablet      1 mg every 8 (eight) hours as needed (nausea/vomiting).          . metoprolol (LOPRESSOR) 50 MG tablet   Oral   Take 50 mg by mouth 2 (two) times daily.         . Multiple Vitamins-Minerals (CENTRUM SILVER ULTRA MENS PO)   Oral   Take 1 tablet by mouth every morning.          Marland Kitchen OMEGA 3 1000 MG CAPS   Oral   Take 1,000 mg by mouth every morning.          Marland Kitchen omeprazole (PRILOSEC) 20 MG capsule   Oral   Take 20 mg by mouth every morning.          . ondansetron (ZOFRAN) 8 MG tablet      Take 1 tablet two times a day starting the day after chemo for 2 days. Then take 1 tablet two times a day as needed for nausea or vomiting.   30 tablet   1   . RiTUXimab (RITUXAN IV)   Intravenous   Inject into the vein every 28 (twenty-eight) days.         . rosuvastatin (CRESTOR) 10 MG tablet   Oral   Take 1 tablet (10 mg total) by mouth daily.   90 tablet   3   . sulfamethoxazole-trimethoprim (BACTRIM DS,SEPTRA DS) 800-160 MG per tablet   Oral   Take 1 tablet by mouth daily. Taking Mondays, Wednesdays and Fridays   30 tablet   0     BP 124/72  Pulse 81  Temp(Src) 99.5 F (37.5 C) (Oral)  Resp 16  SpO2 99%  Physical Exam  Nursing note and vitals reviewed. Constitutional: He is oriented to person, place, and time. He appears well-developed and  well-nourished.  Non-toxic  appearance.  HENT:  Head: Normocephalic.  Right Ear: Tympanic membrane and external ear normal.  Left Ear: Tympanic membrane and external ear normal.  Patient has most of the teeth removed over the upper gum. The teeth of the lower gum are in poor dentition. There are multiple ulcers of the buccal mucosa of the lip as well as the common (lower). The tone shows a thrush-like appearance. There is a white patch of the right posterior pharynx with increased redness around this area. The uvula is in the midline. And the airway is patent. There is increase ulceration in between the teeth of the lower. There is some mild to moderate halitosis present.  Eyes: EOM and lids are normal. Pupils are equal, round, and reactive to light.  Neck: Normal range of motion. Neck supple. Carotid bruit is not present.  Cardiovascular: Normal rate, regular rhythm, intact distal pulses and normal pulses.  Exam reveals no gallop and no friction rub.   Murmur heard. Pulmonary/Chest: Breath sounds normal. No respiratory distress.  Well-healed surgical scar from aortic valve surgery. Port-A-Cath access of the right. There is symmetric rise and fall of the chest.  Abdominal: Soft. Bowel sounds are normal. There is no tenderness. There is no guarding.  Musculoskeletal: Normal range of motion. He exhibits no edema and no tenderness.  Lymphadenopathy:       Head (right side): No submandibular adenopathy present.       Head (left side): No submandibular adenopathy present.    He has no cervical adenopathy.  Neurological: He is alert and oriented to person, place, and time. He has normal strength. No cranial nerve deficit or sensory deficit.  Skin: Skin is warm and dry.  Psychiatric: He has a normal mood and affect. His speech is normal.    ED Course  Procedures (including critical care time)  Labs Reviewed  CBC WITH DIFFERENTIAL  BASIC METABOLIC PANEL   No results found.   No diagnosis  found.    MDM  I have reviewed nursing notes, vital signs, and all appropriate lab and imaging results for this patient. Patient is a 67 year old male who unfortunately suffers from chronic lymphoblastic leukemia and Hodgkin's disease. The patient has been on chemotherapy, with the last dose being approximately 3 weeks ago. He has some dental issues of the teeth of the lower jaw. He is scheduled to have these removed on June 2. He now however has lesions of the lip, gum, and back of the pharynx. These are interfering with his eating and there was concern about dehydration.  Pt seen with me by Dr Hyacinth Meeker.  Patient was treated with a 500 mL IV bolus with some improvement. A basic metabolic panel revealed a slightly low sodium of 132 a low chloride of 95 and a slightly elevated glucose of 112, otherwise within normal limits. A complete blood count revealed a white blood cell count to be very low at 1.4, however this is consistent with where the patient has been over the last several emergency department area visits. The red blood cell count was low at 3.59, the hemoglobin is low at 11, the hematocrit is low at 31.5, the platelets are low at 107,000.  The patient's blood pressure remained somewhat low at 91 systolic. The patient received the second 500 mL bolus and after this the systolic pressures up to 119. The patient seems to be feeling better and even ate a little here in the emergency department. The patient's wife says he seems to be more  awake and alert and his color seems to be better.  The patient will be discharged home with instructions to increase fluids. He will be placed on clindamycin for the possible infections ongoing on in the mouth. He will also be given nystatin mouthwash for the thrush type areas noted on the tongue. He is to see his cancer specialist for additional evaluation next week. The patient and the patient's wife have been encouraged to return immediately if any changes,  problems, or concerns.       Kathie Dike, PA-C 08/26/12 1321

## 2012-08-26 NOTE — ED Notes (Addendum)
Pt presents to er with c/o mouth being sore, pt states that he has been having problems with his teeth making his mouth sore, has been receiving chemo, was placed on "magic mouthwash" with no improvement. Family member concerned that pt is not eating or drinking that much over the past three weeks due to the mouth problems,

## 2012-08-26 NOTE — ED Provider Notes (Signed)
Medical screening examination/treatment/procedure(s) were conducted as a shared visit with non-physician practitioner(s) and myself.  I personally evaluated the patient during the encounter  Please see my separate respective documentation pertaining to this patient encounter   Vida Roller, MD 08/26/12 [phone]

## 2012-08-27 HISTORY — PX: DENTAL SURGERY: SHX609

## 2012-08-28 ENCOUNTER — Telehealth (HOSPITAL_COMMUNITY): Payer: Self-pay

## 2012-08-28 ENCOUNTER — Other Ambulatory Visit (HOSPITAL_COMMUNITY): Payer: Self-pay

## 2012-08-28 DIAGNOSIS — C8190 Hodgkin lymphoma, unspecified, unspecified site: Secondary | ICD-10-CM

## 2012-08-28 DIAGNOSIS — R531 Weakness: Secondary | ICD-10-CM

## 2012-08-28 DIAGNOSIS — C9110 Chronic lymphocytic leukemia of B-cell type not having achieved remission: Secondary | ICD-10-CM

## 2012-08-28 NOTE — Telephone Encounter (Signed)
Message copied by Evelena Leyden on Mon Aug 28, 2012  6:33 PM ------      Message from: [name], ERIC S      Created: Mon Aug 28, 2012  5:34 PM       Needs LDH,ESR,CMET,TSH, and B12 and folic acid levels in am      And VS ------

## 2012-08-28 NOTE — Telephone Encounter (Signed)
Spoke with Dot.  She will bring [name] in for blood work after he's seen by Designer, industrial/product.

## 2012-08-28 NOTE — Telephone Encounter (Signed)
Call from patient's wife states "[name] is not doing well.  He went to the ED yesterday because his mouth and throat is so sore.  He's not eating or drinking much at all and is very weak. The ED doctor stopped his bactrim and put him on clindamycin and nystatin mouthwash.  Just want to know if there is anything else we can do.  He's lost a lot of weight and is so weak he can't hardly stand up.  Just want to know if there is anything we can do to help him get better?'

## 2012-08-29 ENCOUNTER — Other Ambulatory Visit (HOSPITAL_COMMUNITY): Payer: Medicare Other

## 2012-08-30 ENCOUNTER — Encounter (HOSPITAL_COMMUNITY): Payer: Medicare Other | Attending: Oncology

## 2012-08-30 ENCOUNTER — Encounter (HOSPITAL_BASED_OUTPATIENT_CLINIC_OR_DEPARTMENT_OTHER): Payer: Medicare Other | Admitting: Oncology

## 2012-08-30 DIAGNOSIS — K044 Acute apical periodontitis of pulpal origin: Secondary | ICD-10-CM

## 2012-08-30 DIAGNOSIS — Z87898 Personal history of other specified conditions: Secondary | ICD-10-CM

## 2012-08-30 DIAGNOSIS — D839 Common variable immunodeficiency, unspecified: Secondary | ICD-10-CM | POA: Insufficient documentation

## 2012-08-30 DIAGNOSIS — R531 Weakness: Secondary | ICD-10-CM

## 2012-08-30 DIAGNOSIS — K047 Periapical abscess without sinus: Secondary | ICD-10-CM

## 2012-08-30 DIAGNOSIS — C8190 Hodgkin lymphoma, unspecified, unspecified site: Secondary | ICD-10-CM

## 2012-08-30 DIAGNOSIS — C9110 Chronic lymphocytic leukemia of B-cell type not having achieved remission: Secondary | ICD-10-CM

## 2012-08-30 DIAGNOSIS — J392 Other diseases of pharynx: Secondary | ICD-10-CM

## 2012-08-30 DIAGNOSIS — R5381 Other malaise: Secondary | ICD-10-CM | POA: Insufficient documentation

## 2012-08-30 DIAGNOSIS — D801 Nonfamilial hypogammaglobulinemia: Secondary | ICD-10-CM

## 2012-08-30 DIAGNOSIS — D709 Neutropenia, unspecified: Secondary | ICD-10-CM

## 2012-08-30 LAB — COMPREHENSIVE METABOLIC PANEL
ALT: 57 U/L — ABNORMAL HIGH (ref 0–53)
Albumin: 3.4 g/dL — ABNORMAL LOW (ref 3.5–5.2)
Alkaline Phosphatase: 182 U/L — ABNORMAL HIGH (ref 39–117)
BUN: 16 mg/dL (ref 6–23)
Chloride: 96 mEq/L (ref 96–112)
Potassium: 3.7 mEq/L (ref 3.5–5.1)
Sodium: 134 mEq/L — ABNORMAL LOW (ref 135–145)
Total Bilirubin: 0.9 mg/dL (ref 0.3–1.2)
Total Protein: 6.2 g/dL (ref 6.0–8.3)

## 2012-08-30 MED ORDER — PREDNISONE 20 MG PO TABS: ORAL_TABLET | ORAL | Status: DC

## 2012-08-30 MED ORDER — PEGFILGRASTIM INJECTION 6 MG/0.6ML
SUBCUTANEOUS | Status: AC
  Filled 2012-08-30: qty 0.6

## 2012-08-30 MED ORDER — PEGFILGRASTIM INJECTION 6 MG/0.6ML
6.0000 mg | Freq: Once | SUBCUTANEOUS | Status: AC
  Administered 2012-08-30: 6 mg via SUBCUTANEOUS

## 2012-08-30 NOTE — Patient Instructions (Addendum)
[facility] Discharge Instructions  RECOMMENDATIONS MADE BY THE CONSULTANT AND ANY TEST RESULTS WILL BE SENT TO YOUR REFERRING PHYSICIAN.  EXAM FINDINGS BY THE PHYSICIAN TODAY AND SIGNS OR SYMPTOMS TO REPORT TO CLINIC OR PRIMARY PHYSICIAN: Exam and discussion by MD.  Think your weight loss and mouth ulcers are due to your infected gums.  Will give you a neulasta injection to see if it will raise your white blood counts which will help fight infection.  MD is also stopping your chemotherapy for now.  MEDICATIONS PRESCRIBED:  Prednisone 20 mg take 4 pills daily for 4 days, then 3 pills daily for 4 days then 2 daily for 4 days then 1 daily for 4 days.  INSTRUCTIONS GIVEN AND DISCUSSED: Report fevers, continued weight loss or other problems  SPECIAL INSTRUCTIONS/FOLLOW-UP: As scheduled.  Thank you for choosing [facility] to provide your oncology and hematology care.  To afford each patient quality time with our providers, please arrive at least 15 minutes before your scheduled appointment time.  With your help, our goal is to use those 15 minutes to complete the necessary work-up to ensure our physicians have the information they need to help with your evaluation and healthcare recommendations.    Effective January 1st, 2014, we ask that you re-schedule your appointment with our physicians should you arrive 10 or more minutes late for your appointment.  We strive to give you quality time with our providers, and arriving late affects you and other patients whose appointments are after yours.    Again, thank you for choosing [facility].  Our hope is that these requests will decrease the amount of time that you wait before being seen by our physicians.       _____________________________________________________________  Should you have questions after your visit to [facility], please contact our office at [phone] between the hours  of 8:30 a.m. and 5:00 p.m.  Voicemails left after 4:30 p.m. will not be returned until the following business day.  For prescription refill requests, have your pharmacy contact our office with your prescription refill request.

## 2012-08-30 NOTE — Progress Notes (Signed)
[name] presented for labwork in the presence of weight loss and weakness. Labs per MD order drawn via Peripheral Line 25 gauge needle inserted in rt ac.  Good blood return present. Procedure without incident.  Needle removed intact. Patient tolerated procedure well.

## 2012-08-30 NOTE — Progress Notes (Signed)
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 He has been on maintenance rituximab.  #2 history of Hodgkin's disease status post chemotherapy years ago as well.  #3 poor dental hygiene with recent removal of 2 infected teeth and severe aphthous stomatitis bringing him in today. He has lost 20+ pounds in the last month. He cannot eat because his mouth is so sore.  He remains neutropenic. He is not having fevers or chills. He was placed on clindamycin after visit to the ED. He had been treated with some Augmentin prior to this teeth being removed.  He looks weak and tired and chronically ill today. He is not febrile. Lungs are clear. Respirations 18 and unlabored. He has no palpable lymphadenopathy in the cervical, supraclavicular, infraclavicular, axillary or inguinal areas. He has no obvious hepatosplenomegaly. Abdomen is flat and nontender. Heart shows a regular rhythm and rate with a grade 1/6 systolic ejection murmur. He has Port-A-Cath. It is intact. He has no arm edema he has puffiness of his ankles. He is alert and oriented. He has severe large aphthous ulcers right posterior pharynx at the tonsillar pillar, left tonsillar pillar upper lips, lower lips. The largest lesion is almost 2 cm across.  I am wondering whether this may be from maintenance rituximab versus stress from his wife's illness. She is with him today and she has recurrent metastatic uterine cancer.  Her prognosis is terrible and he is very worried about her.  I think we need to put the Rituxan on hold for right now, treat him with steroids for the next 16 days with a taper from 80 mg a day for 4 days eventually 20 mg a day for 4 days and then stop it. He is not better we need to hear from him. I want to give him Neulasta to boost his white cell count because his lower gums still look irritated.

## 2012-08-31 LAB — TSH: TSH: 2.358 u[IU]/mL (ref 0.350–4.500)

## 2012-08-31 LAB — FOLATE: Folate: >20 ng/mL

## 2012-09-20 ENCOUNTER — Other Ambulatory Visit (HOSPITAL_COMMUNITY): Payer: Self-pay | Admitting: Oncology

## 2012-09-21 ENCOUNTER — Other Ambulatory Visit (HOSPITAL_COMMUNITY): Payer: Medicare Other

## 2012-09-21 ENCOUNTER — Encounter (HOSPITAL_BASED_OUTPATIENT_CLINIC_OR_DEPARTMENT_OTHER): Payer: Medicare Other

## 2012-09-21 ENCOUNTER — Ambulatory Visit (HOSPITAL_COMMUNITY)
Admission: RE | Admit: 2012-09-21 | Discharge: 2012-09-21 | Disposition: A | Discharge status: Home / Self Care | Payer: Medicare Other | Source: Ambulatory Visit | Attending: Oncology | Admitting: Oncology

## 2012-09-21 ENCOUNTER — Ambulatory Visit (HOSPITAL_COMMUNITY): Payer: Medicare Other

## 2012-09-21 DIAGNOSIS — K8020 Calculus of gallbladder without cholecystitis without obstruction: Secondary | ICD-10-CM | POA: Insufficient documentation

## 2012-09-21 DIAGNOSIS — Z951 Presence of aortocoronary bypass graft: Secondary | ICD-10-CM | POA: Insufficient documentation

## 2012-09-21 DIAGNOSIS — C9110 Chronic lymphocytic leukemia of B-cell type not having achieved remission: Secondary | ICD-10-CM

## 2012-09-21 DIAGNOSIS — R161 Splenomegaly, not elsewhere classified: Secondary | ICD-10-CM | POA: Insufficient documentation

## 2012-09-21 DIAGNOSIS — C8190 Hodgkin lymphoma, unspecified, unspecified site: Secondary | ICD-10-CM

## 2012-09-21 LAB — CBC WITH DIFFERENTIAL/PLATELET
Basophils Relative: 1 % (ref 0–1)
HCT: 31.6 % — ABNORMAL LOW (ref 39.0–52.0)
Hemoglobin: 10.7 g/dL — ABNORMAL LOW (ref 13.0–17.0)
Lymphocytes Relative: 37 % (ref 12–46)
Lymphs Abs: 0.5 10*3/uL — ABNORMAL LOW (ref 0.7–4.0)
Monocytes Absolute: 0.2 10*3/uL (ref 0.1–1.0)
Monocytes Relative: 15 % — ABNORMAL HIGH (ref 3–12)
Neutro Abs: 0.7 10*3/uL — ABNORMAL LOW (ref 1.7–7.7)
Neutrophils Relative %: 48 % (ref 43–77)
RBC: 3.4 MIL/uL — ABNORMAL LOW (ref 4.22–5.81)
WBC: 1.4 10*3/uL — CL (ref 4.0–10.5)

## 2012-09-21 LAB — COMPREHENSIVE METABOLIC PANEL
ALT: 28 U/L (ref 0–53)
Alkaline Phosphatase: 106 U/L (ref 39–117)
CO2: 26 mEq/L (ref 19–32)
Chloride: 100 mEq/L (ref 96–112)
GFR calc Af Amer: 65 mL/min — ABNORMAL LOW (ref >90)
Glucose, Bld: 105 mg/dL — ABNORMAL HIGH (ref 70–99)
Potassium: 3.7 mEq/L (ref 3.5–5.1)
Sodium: 137 mEq/L (ref 135–145)
Total Bilirubin: 0.7 mg/dL (ref 0.3–1.2)
Total Protein: 5.6 g/dL — ABNORMAL LOW (ref 6.0–8.3)

## 2012-09-21 MED ORDER — IOHEXOL 300 MG/ML  SOLN
100.0000 mL | Freq: Once | INTRAMUSCULAR | Status: AC | PRN
  Administered 2012-09-21: 100 mL via INTRAVENOUS

## 2012-09-21 NOTE — Progress Notes (Signed)
CRITICAL VALUE ALERT  Critical value received:  WBC 1.4  Date of notification:  09/21/12  Time of notification:  1150  Critical value read back:yes  Nurse who received alert:  Tomasita Morrow RN  MESSAGE forwarded to [facility]

## 2012-09-21 NOTE — Progress Notes (Signed)
Labs drawn today for cbc/diff,cmp,ldh 

## 2012-09-25 ENCOUNTER — Encounter (HOSPITAL_COMMUNITY): Payer: Self-pay | Admitting: Oncology

## 2012-09-25 ENCOUNTER — Encounter (HOSPITAL_BASED_OUTPATIENT_CLINIC_OR_DEPARTMENT_OTHER): Payer: Medicare Other | Admitting: Oncology

## 2012-09-25 VITALS — BP 97/60 | HR 84 | Temp 97.6°F | Resp 18 | Wt 199.4 lb

## 2012-09-25 DIAGNOSIS — C9110 Chronic lymphocytic leukemia of B-cell type not having achieved remission: Secondary | ICD-10-CM

## 2012-09-25 MED ORDER — HEPARIN SOD (PORK) LOCK FLUSH 100 UNIT/ML IV SOLN
INTRAVENOUS | Status: AC
  Filled 2012-09-25: qty 5

## 2012-09-25 MED ORDER — SODIUM CHLORIDE 0.9 % IJ SOLN
10.0000 mL | INTRAMUSCULAR | Status: DC | PRN
  Administered 2012-09-25: 10 mL via INTRAVENOUS
  Filled 2012-09-25: qty 10

## 2012-09-25 MED ORDER — HEPARIN SOD (PORK) LOCK FLUSH 100 UNIT/ML IV SOLN
500.0000 [IU] | Freq: Once | INTRAVENOUS | Status: AC
  Administered 2012-09-25: 500 [IU] via INTRAVENOUS
  Filled 2012-09-25: qty 5

## 2012-09-25 NOTE — Progress Notes (Signed)
Daivd W Osinski presented for Portacath access and flush. Proper placement of portacath confirmed by CXR. Portacath located right chest wall accessed with  H 20 needle. Good blood return present. Portacath flushed with 20ml NS and 500U/5ml Heparin and needle removed intact. Procedure without incident. Patient tolerated procedure well.   

## 2012-09-25 NOTE — Patient Instructions (Addendum)
[facility] Discharge Instructions  RECOMMENDATIONS MADE BY THE CONSULTANT AND ANY TEST RESULTS WILL BE SENT TO YOUR REFERRING PHYSICIAN.  EXAM FINDINGS BY THE PHYSICIAN TODAY AND SIGNS OR SYMPTOMS TO REPORT TO CLINIC OR PRIMARY PHYSICIAN: exam and discussion by MD.  Bonita Quin are doing much better.  We will give you a break from therapy.  MEDICATIONS PRESCRIBED:  none  INSTRUCTIONS GIVEN AND DISCUSSED: Report fevers, infections, night sweats or other problems.  SPECIAL INSTRUCTIONS/FOLLOW-UP: Follow-up in 6 weeks with port flush and to see PA.  Thank you for choosing [facility] to provide your oncology and hematology care.  To afford each patient quality time with our providers, please arrive at least 15 minutes before your scheduled appointment time.  With your help, our goal is to use those 15 minutes to complete the necessary work-up to ensure our physicians have the information they need to help with your evaluation and healthcare recommendations.    Effective January 1st, 2014, we ask that you re-schedule your appointment with our physicians should you arrive 10 or more minutes late for your appointment.  We strive to give you quality time with our providers, and arriving late affects you and other patients whose appointments are after yours.    Again, thank you for choosing [facility].  Our hope is that these requests will decrease the amount of time that you wait before being seen by our physicians.       _____________________________________________________________  Should you have questions after your visit to [facility], please contact our office at [phone] between the hours of 8:30 a.m. and 5:00 p.m.  Voicemails left after 4:30 p.m. will not be returned until the following business day.  For prescription refill requests, have your pharmacy contact our office with your prescription refill request.

## 2012-09-25 NOTE — Progress Notes (Signed)
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 He was then placed on maintenance Rituxan and had one cycle. That is presently on hold. He was however diagnosed with CLL many years ago prior to the diagnosis of Hodgkin's disease. At the time of the diagnosis of Hodgkin's disease there was no evidence for CLL at that time.  #2 history of Hodgkin's disease since the status post chemotherapy years ago #3 severe stomatitis which has now resolved status post therapy.  #4 weakness and fatigue #5 poor dental hygiene with removal of many teeth in the past.  Oncology review of systems is otherwise noncontributory except for a slightly different taste in his mouth.  He has started having a better appetite. His mouth pain is much improved. There are no oral ulcers to see. He has no palpable adenopathy. He has no palpable hepatosplenomegaly. His lungs are clear. His heart shows a regular rhythm and rate without murmur rub or gallop. His abdomen is soft and nontender. Bowel sounds are diminished but present. Gynecomastia remains present bilaterally. He has no arm edema or leg edema.  His counts are still somewhat low but better.  I think we need to continue to hold the Rituxan. He needs to return to be seen in 6 weeks.

## 2012-10-03 ENCOUNTER — Other Ambulatory Visit (HOSPITAL_COMMUNITY): Payer: Self-pay | Admitting: Oncology

## 2012-10-03 DIAGNOSIS — D801 Nonfamilial hypogammaglobulinemia: Secondary | ICD-10-CM

## 2012-10-03 DIAGNOSIS — C8190 Hodgkin lymphoma, unspecified, unspecified site: Secondary | ICD-10-CM

## 2012-10-03 DIAGNOSIS — C9110 Chronic lymphocytic leukemia of B-cell type not having achieved remission: Secondary | ICD-10-CM

## 2012-10-03 MED ORDER — ACYCLOVIR 200 MG PO CAPS: 200.0000 mg | ORAL_CAPSULE | Freq: Two times a day (BID) | ORAL | Status: DC

## 2012-10-05 ENCOUNTER — Ambulatory Visit (INDEPENDENT_AMBULATORY_CARE_PROVIDER_SITE_OTHER): Payer: Medicare Other | Admitting: Family Medicine

## 2012-10-05 ENCOUNTER — Encounter: Payer: Self-pay | Admitting: Family Medicine

## 2012-10-05 VITALS — BP 104/60 | HR 77 | Resp 15 | Ht 70.0 in | Wt 206.1 lb

## 2012-10-05 DIAGNOSIS — Z0000 Encounter for general adult medical examination without abnormal findings: Secondary | ICD-10-CM

## 2012-10-05 DIAGNOSIS — E785 Hyperlipidemia, unspecified: Secondary | ICD-10-CM

## 2012-10-05 MED ORDER — ROSUVASTATIN CALCIUM 20 MG PO TABS: 20.0000 mg | ORAL_TABLET | Freq: Every day | ORAL | Status: DC

## 2012-10-05 NOTE — Progress Notes (Signed)
Subjective:    Patient ID: [name], male    DOB: 1946-02-06, 67 y.o.   MRN: [id number]  HPI  Preventive Screening-Counseling & Management   Patient present here today for a Medicare annual wellness visit.   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 She is going for a 2nd opinion out of state next month Over the past two weeks, have you felt down, depressed or hopeless? No  Over the past two weeks, have you felt little interest or pleasure in doing things? No  Have you lost interest or pleasure in daily life? No  Do you often feel hopeless? No  Do you cry easily over simple problems? No   Activities of Daily Living  In your present state of health, do you have any difficulty performing the following activities?  Driving?: yes but drives short disatnces without fatigue Managing money?: yes, wife takes care of bill payments Feeding yourself?:No Getting from bed to chair?:yes unsteady  Climbing a flight of stairs?:yes gets tired Preparing food and eating?:No Bathing or showering?:No Getting dressed?:No Getting to the toilet?:No Using the toilet?:has arm supports to help him to left himself off the commode Moving around from place to place?: feels unsteady when he walks initially  Fall  Risk Assessment In the past year have you fallen or had a near fall?:No Are you currently taking any medications that make you dizzy?:No   Hearing Difficulties: No Do you often ask people to speak up or repeat themselves?:yes Do you experience ringing or noises in your ears?:No Do you have difficulty understanding soft or whispered voices?:yes  Cognitive Testing  Alert? Yes Normal Appearance?Yes  Oriented to person? Yes Place? Yes  Time? Yes  Displays appropriate judgment?Yes  Can read the correct time from a watch face? yes Are you having problems remembering things?No  Advanced Directives have been discussed with the patient?Yes , full code   List the Names of Other Physician/Practitioners you currently use: Oncology, [facility] cardiology   Indicate any recent Medical Services you may have received from other than [facility] providers in the past year (date may be approximate).   Assessment:    Annual Wellness Exam   Plan:    During the course of the visit the patient was educated and counseled about appropriate screening and preventive services including:  A healthy diet is rich in fruit, vegetables and whole grains. Poultry fish, nuts and beans are a healthy choice for protein rather then red meat. A low sodium diet and drinking 64 ounces of water daily is generally recommended. Oils and sweet should be limited. Carbohydrates especially for those who are diabetic or overweight, should be limited to 30-45 gram per meal. It is important to eat on a regular schedule, at least 3 times daily. Snacks  should be primarily fruits, vegetables or nuts. It is important that you exercise regularly at least 30 minutes 5 times a week. If you develop chest pain, have severe difficulty breathing, or feel very tired, stop exercising immediately and seek medical attention  Immunization reviewed and updated. Cancer screening reviewed and updated    Patient Instructions (the written plan) was given  to the patient.  Medicare Attestation  I have personally reviewed:  The patient's medical and social history  Their use of alcohol, tobacco or illicit drugs  Their current medications and supplements  The patient's functional ability including ADLs,fall risks, home safety risks, cognitive, and hearing and visual impairment  Diet and physical activities  Evidence for depression or mood disorders  The patient's weight, height, BMI, and visual acuity have been recorded in the chart. I have made referrals, counseling, and provided education to the patient based on review of the above and I have provided the patient with a written personalized care plan for preventive services.     Review of Systems     Objective:   Physical Exam        Assessment & Plan:

## 2012-10-05 NOTE — Patient Instructions (Addendum)
F/u in 4 month, call if you need me before   Be careful not to fall   Continue to eat soft foods , so that you can swallow safely  Start crestor 20 mg Monday, Wednesday, Friday and Sunday, you will get samples to last   Fasting lipid and cmp in 4 month before next visit

## 2012-10-09 ENCOUNTER — Other Ambulatory Visit (HOSPITAL_COMMUNITY): Payer: Self-pay | Admitting: Oncology

## 2012-10-09 ENCOUNTER — Telehealth (HOSPITAL_COMMUNITY): Payer: Self-pay | Admitting: Oncology

## 2012-10-09 DIAGNOSIS — C8190 Hodgkin lymphoma, unspecified, unspecified site: Secondary | ICD-10-CM

## 2012-10-09 DIAGNOSIS — C9110 Chronic lymphocytic leukemia of B-cell type not having achieved remission: Secondary | ICD-10-CM

## 2012-10-09 MED ORDER — SULFAMETHOXAZOLE-TRIMETHOPRIM 800-160 MG PO TABS: 1.0000 | ORAL_TABLET | ORAL | Status: DC

## 2012-10-10 ENCOUNTER — Inpatient Hospital Stay (HOSPITAL_COMMUNITY): Payer: Medicare Other

## 2012-10-23 ENCOUNTER — Encounter: Payer: Self-pay | Admitting: Family Medicine

## 2012-10-23 ENCOUNTER — Ambulatory Visit (INDEPENDENT_AMBULATORY_CARE_PROVIDER_SITE_OTHER): Payer: Medicare Other | Admitting: Family Medicine

## 2012-10-23 VITALS — BP 118/66 | HR 78 | Resp 18 | Ht 70.0 in | Wt 213.1 lb

## 2012-10-23 DIAGNOSIS — H547 Unspecified visual loss: Secondary | ICD-10-CM

## 2012-10-23 DIAGNOSIS — H00039 Abscess of eyelid unspecified eye, unspecified eyelid: Secondary | ICD-10-CM

## 2012-10-23 DIAGNOSIS — L03213 Periorbital cellulitis: Secondary | ICD-10-CM

## 2012-10-23 DIAGNOSIS — I10 Essential (primary) hypertension: Secondary | ICD-10-CM

## 2012-10-23 MED ORDER — CEFTRIAXONE SODIUM 1 G IJ SOLR
500.0000 mg | Freq: Once | INTRAMUSCULAR | Status: AC
  Administered 2012-10-23: 500 mg via INTRAMUSCULAR

## 2012-10-23 MED ORDER — DOXYCYCLINE HYCLATE 100 MG PO TABS: 100.0000 mg | ORAL_TABLET | Freq: Two times a day (BID) | ORAL | Status: AC

## 2012-10-23 NOTE — Assessment & Plan Note (Signed)
Rocephin 500mg  IM, followed by antibiotic course

## 2012-10-23 NOTE — Patient Instructions (Addendum)
You have cellulitis around the right eye, as well as significant loss of vision on screening  Rocephin 500mg  is administered in the office and doxycyline an antibiotic is prescribed for 1 weeks, start today  You need to see an eye specialist urgently due to vision loss and we are referring you, stop at checkout for appointment information please  F/U  In 10 days

## 2012-10-29 DIAGNOSIS — Z0000 Encounter for general adult medical examination without abnormal findings: Secondary | ICD-10-CM | POA: Insufficient documentation

## 2012-10-29 NOTE — Progress Notes (Signed)
  Subjective:    Patient ID: [name], male    DOB: [birth year], 67 y.o.   MRN: [id number]  HPI  C/o 1 day h/o acute swelling , redness and pain with reduced vision in right eye. No h/o trauma first episode. Has also noted increased watering of the eye  Review of Systems See HPI Denies recent fever or chills. Denies sinus pressure, nasal congestion, ear pain or sore throat. Denies chest congestion, productive cough or wheezing. Denies chest pains, palpitations and leg swelling  Denies depression, uncontrolled  anxiety or insomnia. Denies skin break down or rash.        Objective:   Physical Exam Patient alert and oriented and in no cardiopulmonary distress.  HEENT: No facial asymmetry, EOMI, no sinus tenderness,  oropharynx pink and moist.  Neck supple no adenopathy.periorbital tissue surrounding right eye is red and swollen, in particular the upper lid which is partially occluding the eye, reduced vision on screening . Mild conjunctival injection of right eye, no discharge from eye  Chest: Clear to auscultation bilaterally.  CVS: S1, S2 systolic  murmur, no S3.  ABD: Soft non tender. Bowel sounds normal.  Ext: No edema  MPsych: Good eye contact, normal affect.  not anxious or depressed appearing.         Assessment & Plan:

## 2012-10-29 NOTE — Assessment & Plan Note (Signed)
Controlled, no change in medication  

## 2012-10-29 NOTE — Assessment & Plan Note (Signed)
Annual wellness completed as documented. Pt is limited in mobility die to arthritis as well as fatigue. He is limited in ability to take care of his finances and relies on his spouse who unfortunately is now dealing with recurrent cancer as well Mental health is as good as can be expected, he is relying on his faith to cope with health challenges End of life issues discussed and he is a full code

## 2012-10-29 NOTE — Assessment & Plan Note (Signed)
Urgent referral to opthalmology, pt left office and went directly to eye specialist

## 2012-11-02 ENCOUNTER — Ambulatory Visit (INDEPENDENT_AMBULATORY_CARE_PROVIDER_SITE_OTHER): Payer: Medicare Other | Admitting: Family Medicine

## 2012-11-02 ENCOUNTER — Encounter: Payer: Self-pay | Admitting: Family Medicine

## 2012-11-02 VITALS — BP 110/78 | HR 76 | Resp 15 | Ht 70.0 in | Wt 204.1 lb

## 2012-11-02 DIAGNOSIS — R221 Localized swelling, mass and lump, neck: Secondary | ICD-10-CM | POA: Insufficient documentation

## 2012-11-02 DIAGNOSIS — Z125 Encounter for screening for malignant neoplasm of prostate: Secondary | ICD-10-CM

## 2012-11-02 DIAGNOSIS — R220 Localized swelling, mass and lump, head: Secondary | ICD-10-CM

## 2012-11-02 DIAGNOSIS — E669 Obesity, unspecified: Secondary | ICD-10-CM

## 2012-11-02 DIAGNOSIS — L03213 Periorbital cellulitis: Secondary | ICD-10-CM

## 2012-11-02 DIAGNOSIS — R7309 Other abnormal glucose: Secondary | ICD-10-CM

## 2012-11-02 DIAGNOSIS — H547 Unspecified visual loss: Secondary | ICD-10-CM

## 2012-11-02 DIAGNOSIS — R7303 Prediabetes: Secondary | ICD-10-CM

## 2012-11-02 DIAGNOSIS — H00039 Abscess of eyelid unspecified eye, unspecified eyelid: Secondary | ICD-10-CM

## 2012-11-02 DIAGNOSIS — I10 Essential (primary) hypertension: Secondary | ICD-10-CM

## 2012-11-02 DIAGNOSIS — E785 Hyperlipidemia, unspecified: Secondary | ICD-10-CM

## 2012-11-02 NOTE — Progress Notes (Signed)
  Subjective:    Patient ID: [name], male    DOB: 08-Feb-1946, 67 y.o.   MRN: [id number]  HPI Pt in for f/u right periorbital cellulitis with markedly reduced vision, as well as his chronic problems. Redness and swelling around eye have completely cleared, vsiion is still poor, and he states he was told by his optalmologist that the cataract in the eye is worsening and he may need surgery. C/o hard swelling under chin for several months, states at times tender   Review of Systems See HPI Denies recent fever or chills. Denies sinus pressure, nasal congestion, ear pain or sore throat. Denies chest congestion, productive cough or wheezing. Denies chest pains, palpitations and leg swelling Denies abdominal pain, nausea, vomiting,diarrhea or constipation.   Denies dysuria, frequency, hesitancy or incontinence. Denies joint pain, swelling and limitation in mobility. Denies headaches, seizures, numbness, or tingling. Denies depression, uncontrolled anxiety or insomnia. Denies skin break down or rash.        Objective:   Physical Exam  Patient alert and oriented and in no cardiopulmonary distress.  HEENT: No facial asymmetry, EOMI, no sinus tenderness,  oropharynx pink and moist.  Neck supple no adenopathy.No swelling or erythema around right eye Nodule under chin, hard, diameter approx 3.5 cm  Chest: Clear to auscultation bilaterally.  CVS: S1, S2 no murmurs, no S3.  ABD: Soft non tender. Bowel sounds normal.  Ext: No edema  MS: Adequate ROM spine, shoulders, hips and knees.  Skin: Intact, no ulcerations or rash noted.  Psych: Good eye contact, normal affect. Memory intact not anxious or depressed appearing.  CNS: CN 2-12 intact, power, tone and sensation normal throughout.       Assessment & Plan:

## 2012-11-02 NOTE — Patient Instructions (Addendum)
F/u as before, pls call if you need to see me sooner  Inflammation and infection of skin around right eye is cleared up totally  You are being referred for an ultrasound of your neck to evaluate the swelling

## 2012-11-03 ENCOUNTER — Ambulatory Visit (HOSPITAL_COMMUNITY)
Admission: RE | Admit: 2012-11-03 | Discharge: 2012-11-03 | Disposition: A | Discharge status: Home / Self Care | Payer: Medicare Other | Source: Ambulatory Visit | Attending: Family Medicine | Admitting: Family Medicine

## 2012-11-03 DIAGNOSIS — R221 Localized swelling, mass and lump, neck: Secondary | ICD-10-CM

## 2012-11-03 DIAGNOSIS — R229 Localized swelling, mass and lump, unspecified: Secondary | ICD-10-CM | POA: Insufficient documentation

## 2012-11-05 NOTE — Assessment & Plan Note (Signed)
Resolved following oral antibiotic course

## 2012-11-05 NOTE — Assessment & Plan Note (Signed)
Updated lab needed Patient educated about the importance of limiting  Carbohydrate intake , the need to commit to daily physical activity for a minimum of 30 minutes , and to commit weight loss. The fact that changes in all these areas will reduce or eliminate all together the development of diabetes is stressed.    

## 2012-11-05 NOTE — Assessment & Plan Note (Signed)
Uncontrolled, now compliant with meds. Updated lab next visit Hyperlipidemia:Low fat diet discussed and encouraged.

## 2012-11-05 NOTE — Assessment & Plan Note (Signed)
Palpable nodule under neck, will order [place] and refer for ENT to follow

## 2012-11-05 NOTE — Assessment & Plan Note (Signed)
Improved. Pt applauded on succesful weight loss through lifestyle change, and encouraged to continue same. Weight loss goal set for the next several months.  

## 2012-11-06 ENCOUNTER — Encounter (HOSPITAL_BASED_OUTPATIENT_CLINIC_OR_DEPARTMENT_OTHER): Payer: Medicare Other

## 2012-11-06 ENCOUNTER — Encounter: Payer: Self-pay | Admitting: Family Medicine

## 2012-11-06 ENCOUNTER — Telehealth: Payer: Self-pay | Admitting: Family Medicine

## 2012-11-06 ENCOUNTER — Encounter (HOSPITAL_COMMUNITY): Payer: Medicare Other | Attending: Oncology

## 2012-11-06 VITALS — BP 114/73 | HR 74 | Temp 97.5°F | Resp 20 | Wt 205.6 lb

## 2012-11-06 DIAGNOSIS — C9110 Chronic lymphocytic leukemia of B-cell type not having achieved remission: Secondary | ICD-10-CM

## 2012-11-06 DIAGNOSIS — D61818 Other pancytopenia: Secondary | ICD-10-CM

## 2012-11-06 DIAGNOSIS — Z452 Encounter for adjustment and management of vascular access device: Secondary | ICD-10-CM

## 2012-11-06 DIAGNOSIS — C8190 Hodgkin lymphoma, unspecified, unspecified site: Secondary | ICD-10-CM

## 2012-11-06 LAB — COMPREHENSIVE METABOLIC PANEL
Albumin: 3.4 g/dL — ABNORMAL LOW (ref 3.5–5.2)
BUN: 13 mg/dL (ref 6–23)
Calcium: 8.8 mg/dL (ref 8.4–10.5)
Chloride: 102 mEq/L (ref 96–112)
Creatinine, Ser: 1.12 mg/dL (ref 0.50–1.35)
Total Bilirubin: 0.4 mg/dL (ref 0.3–1.2)
Total Protein: 5.3 g/dL — ABNORMAL LOW (ref 6.0–8.3)

## 2012-11-06 LAB — CBC WITH DIFFERENTIAL/PLATELET
Basophils Absolute: 0 10*3/uL (ref 0.0–0.1)
Eosinophils Absolute: 0 10*3/uL (ref 0.0–0.7)
HCT: 31.9 % — ABNORMAL LOW (ref 39.0–52.0)
Lymphs Abs: 0.7 10*3/uL (ref 0.7–4.0)
MCH: 32.7 pg (ref 26.0–34.0)
MCHC: 32.9 g/dL (ref 30.0–36.0)
MCV: 99.4 fL (ref 78.0–100.0)
Monocytes Absolute: 0.3 10*3/uL (ref 0.1–1.0)
Neutro Abs: 0.3 10*3/uL — ABNORMAL LOW (ref 1.7–7.7)
RDW: 17.1 % — ABNORMAL HIGH (ref 11.5–15.5)

## 2012-11-06 LAB — LACTATE DEHYDROGENASE: LDH: 231 U/L (ref 94–250)

## 2012-11-06 MED ORDER — HEPARIN SOD (PORK) LOCK FLUSH 100 UNIT/ML IV SOLN
INTRAVENOUS | Status: AC
  Filled 2012-11-06: qty 5

## 2012-11-06 MED ORDER — HEPARIN SOD (PORK) LOCK FLUSH 100 UNIT/ML IV SOLN
500.0000 [IU] | Freq: Once | INTRAVENOUS | Status: AC
  Administered 2012-11-06: 500 [IU] via INTRAVENOUS
  Filled 2012-11-06: qty 5

## 2012-11-06 MED ORDER — SODIUM CHLORIDE 0.9 % IJ SOLN
10.0000 mL | INTRAMUSCULAR | Status: DC | PRN
  Administered 2012-11-06: 10 mL via INTRAVENOUS
  Filled 2012-11-06: qty 10

## 2012-11-06 NOTE — Progress Notes (Signed)
[name] presented for Portacath access and flush. Proper placement of portacath confirmed by CXR. Portacath located rt  chest wall accessed with  H 20 needle. Good blood return present. Portacath flushed with 20ml NS and 500U/5ml Heparin and needle removed intact. Procedure without incident. Patient tolerated procedure well.   

## 2012-11-06 NOTE — Progress Notes (Signed)
CRITICAL VALUE ALERT Critical value received:  WBC 1.3 Date of notification:  11/06/2012  Time of notification: 1640 Critical value read back:  yes Nurse who received alert:  Micheal Sheen, Blair Hailey, RN MD notified (1st page):  Dr. Lorre Nick.  Per MD, pt to remain on neutropenic precautions.  Earl Gala contacted and message left on answering machine about neutropenic precautions and requesting call back to confirm receipt of message.

## 2012-11-06 NOTE — Telephone Encounter (Signed)
No recent labs noted that were not performed by specialty clinic.

## 2012-11-06 NOTE — Telephone Encounter (Signed)
Error

## 2012-11-06 NOTE — Patient Instructions (Addendum)
[facility] Discharge Instructions  RECOMMENDATIONS MADE BY THE CONSULTANT AND ANY TEST RESULTS WILL BE SENT TO YOUR REFERRING PHYSICIAN.  EXAM FINDINGS BY THE PHYSICIAN TODAY AND SIGNS OR SYMPTOMS TO REPORT TO CLINIC OR PRIMARY PHYSICIAN:   Return in 4 weeks to see Covering Provider September 8 @ 10:30  We are planning on starting Rituxan the day afer you see the Covering Provider.    Thank you for choosing [facility] to provide your oncology and hematology care.  To afford each patient quality time with our providers, please arrive at least 15 minutes before your scheduled appointment time.  With your help, our goal is to use those 15 minutes to complete the necessary work-up to ensure our physicians have the information they need to help with your evaluation and healthcare recommendations.    Effective January 1st, 2014, we ask that you re-schedule your appointment with our physicians should you arrive 10 or more minutes late for your appointment.  We strive to give you quality time with our providers, and arriving late affects you and other patients whose appointments are after yours.    Again, thank you for choosing [facility].  Our hope is that these requests will decrease the amount of time that you wait before being seen by our physicians.       _____________________________________________________________  Should you have questions after your visit to [facility], please contact our office at [phone] between the hours of 8:30 a.m. and 5:00 p.m.  Voicemails left after 4:30 p.m. will not be returned until the following business day.  For prescription refill requests, have your pharmacy contact our office with your prescription refill request.

## 2012-11-06 NOTE — Progress Notes (Signed)
[facility] OFFICE PROGRESS NOTE  PCP: [name], MD [address] [place] [address] DIAGNOSIS   HX RECURRENT CLL, S/P TX   PANCYTOPENIA, RESOLVED STOMATITIS ,   CURRENT THERAPY:  INTERVAL HISTORY: [name] 67 y.o. male returns for    MEDICAL HISTORY: Past Medical History  Diagnosis Date  . Aortic stenosis   . Atrial fibrillation   . Coronary atherosclerosis of native coronary artery   . Hyperlipidemia   . Essential hypertension, benign   . BPH (benign prostatic hypertrophy)   . Elevated WBC count   . Arthritis   . Acid reflux   . Hypogammaglobulinemia   . Pre-diabetes 08/04/2011  . CLL (chronic lymphoblastic leukemia)   . Hodgkin's disease   . Kidney stones     SURGICAL HISTORY:  Past Surgical History  Procedure Laterality Date  . Eye surgery    . 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 Esophagogastroduodenoscopy      scrapping of throat and stretching  . Lithotripsy    . Bone marrow aspiration    . Portacath placement    . 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 Colon surgery    . Dental surgery  08/2012  . Multiple tooth extractions        PROBLEM LIST : has HODGKIN'S DISEASE; CLL; HYPERLIPIDEMIA; OBESITY; CORONARY ATHEROSCLEROSIS NATIVE CORONARY ARTERY; AORTIC STENOSIS; Atrial fibrillation; OTHER DYSPHAGIA; AORTIC VALVE REPLACEMENT, HX OF; Essential hypertension, benign; GERD (gastroesophageal reflux disease); Thrombocytopenia; Hypogammaglobulinemia, acquired; Anemia; Tachyarrhythmia; Pre-diabetes; Dental infection; Piles (hemorrhoids); Periorbital cellulitis of right eye; Reduced vision; Routine general medical examination at a health care facility; and Neck mass on his problem list.    ALLERGIES:  has No Known Allergies.  MEDICATIONS: Current outpatient prescriptions:acyclovir (ZOVIRAX) 200 MG capsule, Take 1 capsule (200 mg total) by mouth 2 (two) times daily., Disp: 60 capsule,  Rfl: 2;  aspirin EC 81 MG tablet, Take 81 mg by mouth daily., Disp: , Rfl: ;  folic acid (FOLVITE) 1 MG tablet, Take 1 mg by mouth daily., Disp: , Rfl: ;  HYDROcodone-acetaminophen (NORCO/VICODIN) 5-325 MG per tablet, Take 1 tablet by mouth every 8 (eight) hours as needed for pain., Disp: , Rfl:  metoprolol (LOPRESSOR) 50 MG tablet, Take 50 mg by mouth 2 (two) times daily., Disp: , Rfl: ;  Multiple Vitamins-Minerals (CENTRUM SILVER ULTRA MENS PO), Take 1 tablet by mouth every morning. , Disp: , Rfl: ;  OMEGA 3 1000 MG CAPS, Take 1,000 mg by mouth daily. , Disp: , Rfl: ;  omeprazole (PRILOSEC) 20 MG capsule, Take 20 mg by mouth every morning. , Disp: , Rfl:  rosuvastatin (CRESTOR) 20 MG tablet, Take 1 tablet (20 mg total) by mouth at bedtime., Disp: 30 tablet, Rfl: 11 No current facility-administered medications for this visit. Facility-Administered Medications Ordered in Other Visits: sodium chloride 0.9 % injection 10 mL, 10 mL, Intracatheter, PRN, Randall An, MD, 10 mL at 02/17/12 1019;  sodium chloride 0.9 % injection 10 mL, 10 mL, Intravenous, PRN, Ellouise Newer, PA-C, 10 mL at 11/06/12 1213    REVIEW OF SYSTEMS:  No headache no dizziness no fever chills or sweats not cough. He is recently developed a nodule in his neck in the midline he had an ultrasound done and has an appointment with ENT for evaluation no abdominal pain no bone pain  PHYSICAL EXAMINATION: ECOG PERFORMANCE STATUS: 1 -  Symptomatic but completely ambulatory  Filed Vitals:   11/06/12 1138  BP: 114/73  Pulse: 74  Temp: 97.5 F (36.4 C)  Resp: 20    GENERAL: No acute distress.  SKIN:  No rashes or bruising  EYES: Sclera and conjuntiva clear  ENT: In the neck in the midline is a small  1 cm hard fixed nodule LYMPH: No palpable lymphadenopathy neck, supraclavicular submandibular axilla  LUNGS: Clear to auscultation, no crackles or wheezes or rhonchi HEART: Regular rate & rhythm,   ABDOMEN: Abdomen soft,  non-tender, , no masses or organomegaly   MSK: No deformity, no tenderness over spine, no acutely hot or inflammed joints EXTREMITIES: No lower ext edema NEURO: Alert & oriented, no gross focal weakness. PSYCH  Cooperative, mood/affect normal   LABORATORY DATA: See labs,  Polys 300, hgb 10.5, plts 115, met panel ok  RADIOGRAPHIC STUDIES: No results found.   ASSESSMENT: Clinically stable, some fatigue, some residual soreness in the mouth leading certain spicy foods. Otherwise the significant stomatitis and ulcers have resolved. His by mouth intake is okay. Most notably he has persistent pancytopenia. This appears to be following Rituxan and bendamustine. With regard to other possible causes, the time courses compelling that this is a chemotherapy effect, he is on acyclovir but has been on that for a long time, don't expect that he has hypersplenism, no evidence of hypothyroid or B12. He has a lesion in the neck, right in the midline, to me is on like a typical lymph node of Hodgkin's disease or CLL, I think more likely this will be a cyst, potentially could be a new  malignancy or head and neck carcinoma   PLAN: Patient has an ENT appointment. I will discuss with ENT physician regarding the pancytopenia in case biopsies done or if this influences antibiotic choice. Continue to follow CBC. Can check a B12 and thyroid studies with next lab check   All questions were answered. The patient knows to call the clinic with any problems, questions or concerns. We can certainly see the patient much sooner if necessary.     Marin Roberts, MD 11/06/2012 1:27 PM

## 2012-11-23 ENCOUNTER — Ambulatory Visit (INDEPENDENT_AMBULATORY_CARE_PROVIDER_SITE_OTHER): Payer: Medicare Other | Admitting: Otolaryngology

## 2012-11-23 DIAGNOSIS — D487 Neoplasm of uncertain behavior of other specified sites: Secondary | ICD-10-CM

## 2012-11-23 DIAGNOSIS — R599 Enlarged lymph nodes, unspecified: Secondary | ICD-10-CM

## 2012-11-29 ENCOUNTER — Encounter (HOSPITAL_BASED_OUTPATIENT_CLINIC_OR_DEPARTMENT_OTHER): Payer: Self-pay | Admitting: *Deleted

## 2012-11-29 NOTE — Progress Notes (Signed)
Pt has lymphoma and last chemo 3 months-wbc 1.3 11/06/12-will see if anesth wants cbc-wife states he is doing well-no resp problems-just tired-she is also doing chemo and has PAC-will call her back about labs

## 2012-11-30 NOTE — Progress Notes (Signed)
Reviewed notes and labs with dr fitzgerald-does not need to repeat cbc

## 2012-12-04 ENCOUNTER — Ambulatory Visit (HOSPITAL_COMMUNITY): Payer: Medicare Other

## 2012-12-04 ENCOUNTER — Ambulatory Visit (HOSPITAL_COMMUNITY): Payer: Medicare Other | Admitting: Oncology

## 2012-12-05 ENCOUNTER — Encounter (HOSPITAL_BASED_OUTPATIENT_CLINIC_OR_DEPARTMENT_OTHER): Payer: Self-pay | Admitting: *Deleted

## 2012-12-05 ENCOUNTER — Ambulatory Visit (HOSPITAL_BASED_OUTPATIENT_CLINIC_OR_DEPARTMENT_OTHER)
Admission: RE | Admit: 2012-12-05 | Discharge: 2012-12-05 | Disposition: A | Discharge status: Home / Self Care | Payer: Medicare Other | Source: Ambulatory Visit | Attending: Otolaryngology | Admitting: Otolaryngology

## 2012-12-05 ENCOUNTER — Ambulatory Visit (HOSPITAL_BASED_OUTPATIENT_CLINIC_OR_DEPARTMENT_OTHER): Payer: Medicare Other | Admitting: Anesthesiology

## 2012-12-05 ENCOUNTER — Encounter (HOSPITAL_BASED_OUTPATIENT_CLINIC_OR_DEPARTMENT_OTHER)
Admission: RE | Disposition: A | Discharge status: Home / Self Care | Payer: Self-pay | Source: Ambulatory Visit | Attending: Otolaryngology

## 2012-12-05 ENCOUNTER — Encounter (HOSPITAL_BASED_OUTPATIENT_CLINIC_OR_DEPARTMENT_OTHER): Payer: Self-pay | Admitting: Anesthesiology

## 2012-12-05 ENCOUNTER — Other Ambulatory Visit: Payer: Self-pay

## 2012-12-05 DIAGNOSIS — K219 Gastro-esophageal reflux disease without esophagitis: Secondary | ICD-10-CM | POA: Insufficient documentation

## 2012-12-05 DIAGNOSIS — R221 Localized swelling, mass and lump, neck: Secondary | ICD-10-CM

## 2012-12-05 DIAGNOSIS — I359 Nonrheumatic aortic valve disorder, unspecified: Secondary | ICD-10-CM | POA: Insufficient documentation

## 2012-12-05 DIAGNOSIS — C9110 Chronic lymphocytic leukemia of B-cell type not having achieved remission: Secondary | ICD-10-CM | POA: Insufficient documentation

## 2012-12-05 DIAGNOSIS — L980 Pyogenic granuloma: Secondary | ICD-10-CM | POA: Insufficient documentation

## 2012-12-05 DIAGNOSIS — C8190 Hodgkin lymphoma, unspecified, unspecified site: Secondary | ICD-10-CM | POA: Insufficient documentation

## 2012-12-05 DIAGNOSIS — N400 Enlarged prostate without lower urinary tract symptoms: Secondary | ICD-10-CM | POA: Insufficient documentation

## 2012-12-05 DIAGNOSIS — Z9221 Personal history of antineoplastic chemotherapy: Secondary | ICD-10-CM | POA: Insufficient documentation

## 2012-12-05 DIAGNOSIS — Z951 Presence of aortocoronary bypass graft: Secondary | ICD-10-CM | POA: Insufficient documentation

## 2012-12-05 DIAGNOSIS — M129 Arthropathy, unspecified: Secondary | ICD-10-CM | POA: Insufficient documentation

## 2012-12-05 DIAGNOSIS — Z79899 Other long term (current) drug therapy: Secondary | ICD-10-CM | POA: Insufficient documentation

## 2012-12-05 DIAGNOSIS — E785 Hyperlipidemia, unspecified: Secondary | ICD-10-CM | POA: Insufficient documentation

## 2012-12-05 DIAGNOSIS — Z954 Presence of other heart-valve replacement: Secondary | ICD-10-CM | POA: Insufficient documentation

## 2012-12-05 DIAGNOSIS — I10 Essential (primary) hypertension: Secondary | ICD-10-CM | POA: Insufficient documentation

## 2012-12-05 DIAGNOSIS — I4891 Unspecified atrial fibrillation: Secondary | ICD-10-CM | POA: Insufficient documentation

## 2012-12-05 DIAGNOSIS — D759 Disease of blood and blood-forming organs, unspecified: Secondary | ICD-10-CM | POA: Insufficient documentation

## 2012-12-05 DIAGNOSIS — I2510 Atherosclerotic heart disease of native coronary artery without angina pectoris: Secondary | ICD-10-CM | POA: Insufficient documentation

## 2012-12-05 DIAGNOSIS — D801 Nonfamilial hypogammaglobulinemia: Secondary | ICD-10-CM | POA: Insufficient documentation

## 2012-12-05 HISTORY — DX: Presence of dental prosthetic device (complete) (partial): Z97.2

## 2012-12-05 HISTORY — DX: Unspecified hearing loss, unspecified ear: H91.90

## 2012-12-05 HISTORY — PX: MASS EXCISION: SHX2000

## 2012-12-05 HISTORY — DX: Presence of spectacles and contact lenses: Z97.3

## 2012-12-05 LAB — POCT I-STAT, CHEM 8
BUN: 7 mg/dL (ref 6–23)
Chloride: 104 mEq/L (ref 96–112)
Creatinine, Ser: 1.1 mg/dL (ref 0.50–1.35)
Sodium: 141 mEq/L (ref 135–145)
TCO2: 26 mmol/L (ref 0–100)

## 2012-12-05 SURGERY — EXCISION MASS
Anesthesia: General | Site: Neck | Wound class: Clean

## 2012-12-05 MED ORDER — FENTANYL CITRATE 0.05 MG/ML IJ SOLN
50.0000 ug | INTRAMUSCULAR | Status: DC | PRN
  Administered 2012-12-05: 50 ug via INTRAVENOUS

## 2012-12-05 MED ORDER — HYDROMORPHONE HCL PF 1 MG/ML IJ SOLN: 0.2500 mg | INTRAMUSCULAR | Status: DC | PRN

## 2012-12-05 MED ORDER — EPHEDRINE SULFATE 50 MG/ML IJ SOLN
INTRAMUSCULAR | Status: DC | PRN
  Administered 2012-12-05 (×3): 10 mg via INTRAVENOUS

## 2012-12-05 MED ORDER — ONDANSETRON HCL 4 MG/2ML IJ SOLN: 4.0000 mg | Freq: Once | INTRAMUSCULAR | Status: DC | PRN

## 2012-12-05 MED ORDER — OXYCODONE HCL 5 MG PO TABS
5.0000 mg | ORAL_TABLET | Freq: Once | ORAL | Status: AC | PRN
  Administered 2012-12-05: 5 mg via ORAL

## 2012-12-05 MED ORDER — PROPOFOL 10 MG/ML IV BOLUS
INTRAVENOUS | Status: DC | PRN
  Administered 2012-12-05: 150 mg via INTRAVENOUS

## 2012-12-05 MED ORDER — MIDAZOLAM HCL 2 MG/2ML IJ SOLN: 1.0000 mg | INTRAMUSCULAR | Status: DC | PRN

## 2012-12-05 MED ORDER — LACTATED RINGERS IV SOLN
INTRAVENOUS | Status: DC
  Administered 2012-12-05: 08:00:00 via INTRAVENOUS
  Administered 2012-12-05: 10 mL/h via INTRAVENOUS

## 2012-12-05 MED ORDER — HYDROCODONE-ACETAMINOPHEN 5-325 MG PO TABS: 1.0000 | ORAL_TABLET | ORAL | Status: DC | PRN

## 2012-12-05 MED ORDER — MEPERIDINE HCL 25 MG/ML IJ SOLN: 6.2500 mg | INTRAMUSCULAR | Status: DC | PRN

## 2012-12-05 MED ORDER — LIDOCAINE-EPINEPHRINE 1 %-1:100000 IJ SOLN
INTRAMUSCULAR | Status: DC | PRN
  Administered 2012-12-05: 2 mL

## 2012-12-05 MED ORDER — OXYCODONE HCL 5 MG/5ML PO SOLN: 5.0000 mg | Freq: Once | ORAL | Status: AC | PRN

## 2012-12-05 MED ORDER — AMOXICILLIN 875 MG PO TABS: 875.0000 mg | ORAL_TABLET | Freq: Two times a day (BID) | ORAL | Status: AC

## 2012-12-05 MED ORDER — LIDOCAINE HCL (CARDIAC) 20 MG/ML IV SOLN
INTRAVENOUS | Status: DC | PRN
  Administered 2012-12-05: 100 mg via INTRAVENOUS

## 2012-12-05 SURGICAL SUPPLY — 72 items
ADH SKN CLS APL DERMABOND .7 (GAUZE/BANDAGES/DRESSINGS) ×1
APL SKNCLS STERI-STRIP NONHPOA (GAUZE/BANDAGES/DRESSINGS)
BENZOIN TINCTURE PRP APPL 2/3 (GAUZE/BANDAGES/DRESSINGS) IMPLANT
BLADE SURG 15 STRL LF DISP TIS (BLADE) ×1 IMPLANT
BLADE SURG 15 STRL SS (BLADE) ×2
CANISTER SUCTION 1200CC (MISCELLANEOUS) ×1 IMPLANT
CLEANER CAUTERY TIP 5X5 PAD (MISCELLANEOUS) IMPLANT
CLIP TI MEDIUM 6 (CLIP) IMPLANT
CLIP TI WIDE RED SMALL 6 (CLIP) IMPLANT
CLOTH BEACON ORANGE TIMEOUT ST (SAFETY) ×2 IMPLANT
CORDS BIPOLAR (ELECTRODE) IMPLANT
COVER MAYO STAND STRL (DRAPES) ×2 IMPLANT
COVER TABLE BACK 60X90 (DRAPES) ×2 IMPLANT
DECANTER SPIKE VIAL GLASS SM (MISCELLANEOUS) ×1 IMPLANT
DERMABOND ADVANCED (GAUZE/BANDAGES/DRESSINGS) ×1
DERMABOND ADVANCED .7 DNX12 (GAUZE/BANDAGES/DRESSINGS) IMPLANT
DRAIN JACKSON RD 7FR 3/32 (WOUND CARE) IMPLANT
DRAIN PENROSE 1/4X12 LTX STRL (WOUND CARE) IMPLANT
DRAIN TLS ROUND 10FR (DRAIN) IMPLANT
DRAPE U-SHAPE 76X120 STRL (DRAPES) ×2 IMPLANT
ELECT COATED BLADE 2.86 ST (ELECTRODE) ×2 IMPLANT
ELECT NDL BLADE 2-5/6 (NEEDLE) IMPLANT
ELECT NEEDLE BLADE 2-5/6 (NEEDLE) ×2 IMPLANT
ELECT PAIRED SUBDERMAL (MISCELLANEOUS)
ELECT REM PT RETURN 9FT ADLT (ELECTROSURGICAL) ×2
ELECTRODE PAIRED SUBDERMAL (MISCELLANEOUS) IMPLANT
ELECTRODE REM PT RTRN 9FT ADLT (ELECTROSURGICAL) ×1 IMPLANT
EVACUATOR SILICONE 100CC (DRAIN) IMPLANT
GAUZE SPONGE 4X4 12PLY STRL LF (GAUZE/BANDAGES/DRESSINGS) IMPLANT
GAUZE SPONGE 4X4 16PLY XRAY LF (GAUZE/BANDAGES/DRESSINGS) IMPLANT
GLOVE BIO SURGEON STRL SZ7.5 (GLOVE) ×2 IMPLANT
GLOVE SURG SS PI 7.0 STRL IVOR (GLOVE) ×1 IMPLANT
GOWN PREVENTION PLUS XLARGE (GOWN DISPOSABLE) ×2 IMPLANT
HEMOSTAT SURGICEL .5X2 ABSORB (HEMOSTASIS) IMPLANT
LOCATOR NERVE 3 VOLT (DISPOSABLE) IMPLANT
NDL HYPO 25X1 1.5 SAFETY (NEEDLE) IMPLANT
NEEDLE 27GAX1X1/2 (NEEDLE) ×2 IMPLANT
NEEDLE HYPO 25X1 1.5 SAFETY (NEEDLE) IMPLANT
NS IRRIG 1000ML POUR BTL (IV SOLUTION) ×2 IMPLANT
PACK BASIN DAY SURGERY FS (CUSTOM PROCEDURE TRAY) ×2 IMPLANT
PAD CLEANER CAUTERY TIP 5X5 (MISCELLANEOUS)
PENCIL BUTTON HOLSTER BLD 10FT (ELECTRODE) ×2 IMPLANT
PIN SAFETY STERILE (MISCELLANEOUS) IMPLANT
PROBE NERVBE PRASS .33 (MISCELLANEOUS) IMPLANT
SLEEVE SCD COMPRESS KNEE MED (MISCELLANEOUS) ×1 IMPLANT
SPONGE GAUZE 2X2 8PLY STRL LF (GAUZE/BANDAGES/DRESSINGS) IMPLANT
STAPLER VISISTAT 35W (STAPLE) IMPLANT
STRIP CLOSURE SKIN 1/4X4 (GAUZE/BANDAGES/DRESSINGS) IMPLANT
SUCTION FRAZIER TIP 10 FR DISP (SUCTIONS) IMPLANT
SUT CHROMIC 3 0 PS 2 (SUTURE) IMPLANT
SUT CHROMIC 4 0 P 3 18 (SUTURE) IMPLANT
SUT ETHILON 3 0 PS 1 (SUTURE) IMPLANT
SUT ETHILON 4 0 PS 2 18 (SUTURE) IMPLANT
SUT ETHILON 5 0 P 3 18 (SUTURE)
SUT NYLON ETHILON 5-0 P-3 1X18 (SUTURE) IMPLANT
SUT PLAIN 5 0 P 3 18 (SUTURE) IMPLANT
SUT PROLENE 4 0 P 3 18 (SUTURE) IMPLANT
SUT PROLENE 5 0 P 3 (SUTURE) IMPLANT
SUT SILK 3 0 TIES 17X18 (SUTURE)
SUT SILK 3-0 18XBRD TIE BLK (SUTURE) IMPLANT
SUT SILK 4 0 TIES 17X18 (SUTURE) ×1 IMPLANT
SUT VIC AB 3-0 FS2 27 (SUTURE) IMPLANT
SUT VIC AB 4-0 P-3 18XBRD (SUTURE) IMPLANT
SUT VIC AB 4-0 P3 18 (SUTURE)
SUT VIC AB 4-0 RB1 27 (SUTURE)
SUT VIC AB 4-0 RB1 27X BRD (SUTURE) IMPLANT
SUT VICRYL 4-0 PS2 18IN ABS (SUTURE) ×1 IMPLANT
SYR BULB 3OZ (MISCELLANEOUS) IMPLANT
SYR CONTROL 10ML LL (SYRINGE) ×2 IMPLANT
TOWEL OR 17X24 6PK STRL BLUE (TOWEL DISPOSABLE) ×2 IMPLANT
TRAY DSU PREP LF (CUSTOM PROCEDURE TRAY) ×2 IMPLANT
TUBE CONNECTING 20X1/4 (TUBING) ×1 IMPLANT

## 2012-12-05 NOTE — Op Note (Signed)
[name], [name]              ACCOUNT NO.:  [id number]  MEDICAL RECORD NO.:  [id number]  LOCATION:                                 FACILITY:  PHYSICIAN:  Newman Pies, MD            DATE OF BIRTH:  January 12, 1946  DATE OF PROCEDURE:  12/05/2012 DATE OF DISCHARGE:                              OPERATIVE REPORT   SURGEON:  Newman Pies, MD  PREOPERATIVE DIAGNOSIS:  Neck mass.  POSTOPERATIVE DIAGNOSIS:  Neck mass.  PROCEDURE PERFORMED:  Excisional biopsy of neck mass.  ANESTHESIA:  Laryngeal mask anesthesia.  COMPLICATIONS:  None.  ESTIMATED BLOOD LOSS:  Minimal.  INDICATION FOR PROCEDURE:  The patient is a 67 year old male with a submandibular mass.  According to the patient, the mass is nontender to touch.  He recently underwent an ultrasound evaluation of his neck.  It shows a 12 mm nodule.  The differential diagnoses include an abnormal lymph node, a sebaceous cyst, epidermal inclusion cyst, other nonspecific complex or solid lesion.  The patient has a history of chronic lymphocytic leukemia, and Hodgkin disease.  He was treated with chemotherapy.  Based on the above findings, the decision was made for the patient to undergo excision of the neck mass for definitive histologic diagnosis.  The risks, benefits, alternatives, and details of the procedure were discussed with the patient.  Questions were invited and answered.  Informed consent was obtained.  DESCRIPTION OF PROCEDURE:  The patient was taken to the operating room and placed supine on the operating table.  General anesthesia was administered by the anesthesiologist via laryngeal mass.  The patient was positioned and prepped and draped in a standard fashion for a neck mass excision.  A 1% lidocaine with 1:100,000 epinephrine was injected over the mass.  The mass measures approximately 1 cm in size.  A horizontal incision was made over the left level 2 neck.  The incision was carried down past the platysma muscles.   Inferior and superiorly based subplatysmal flaps were elevated in a standard fashion.  At this time, a 1 cm mass was noted to be adherent to the floor of mouth muscles.  The tissue surrounding the mass was significantly fibrotic. Careful dissection was then carried out around the mass, dissecting the mass free from the surrounding muscles and the fatty tissues.  Multiple vessels were identified and ligated.  The entire mass was then dissected free from the floor of mouth musculare.  The entire mass was sent as a fresh specimen to the Pathology Department.  The surgical site was then copiously irrigated.  Hemostasis was achieved.  The incision was closed in layers with 4-0 Vicryl and Dermabond.  The care of the patient was turned over to the anesthesiologist.  The patient was awakened from anesthesia without difficulty.  He was extubated and transferred to the recovery room in good condition.  OPERATIVE FINDINGS:  A 1 cm mass is noted within the left submandibular area.  The mass was noted to be deep to the platysma muscles, and tightly adhered to the muscles of the floor of mouth.  The entire mass was carefully dissected and sent to the Pathology Department.  SPECIMEN:  Neck mass.  FOLLOWUP CARE:  The patient will be discharged home once he is awake and alert.  He will be placed on Vicodin 1 tablet p.o. q.4 hours p.r.n. pain and amoxicillin 875 mg p.o. b.i.d. for 5 days.  The patient will follow up in my office in 1 week.     Newman Pies, MD     ST/MEDQ  D:  12/05/2012  T:  12/05/2012  Job:  [id number]  cc:   Milus Mallick. Lodema Hong, M.D.

## 2012-12-05 NOTE — Anesthesia Preprocedure Evaluation (Signed)
Anesthesia Evaluation  Patient identified by MRN, date of birth, ID band Patient awake    Reviewed: Allergy & Precautions, H&P , NPO status , Patient's Chart, lab work & pertinent test results  Airway Mallampati: I TM Distance: >3 FB Neck ROM: Full    Dental   Pulmonary          Cardiovascular hypertension, Pt. on medications + CAD and + CABG + dysrhythmias + Valvular Problems/Murmurs AS     Neuro/Psych    GI/Hepatic GERD-  Medicated and Controlled,  Endo/Other    Renal/GU      Musculoskeletal   Abdominal   Peds  Hematology  (+) Blood dyscrasia, ,   Anesthesia Other Findings   Reproductive/Obstetrics                           Anesthesia Physical Anesthesia Plan  ASA: III  Anesthesia Plan: General   Post-op Pain Management:    Induction: Intravenous  Airway Management Planned: Oral ETT  Additional Equipment:   Intra-op Plan:   Post-operative Plan: Extubation in OR  Informed Consent: I have reviewed the patients History and Physical, chart, labs and discussed the procedure including the risks, benefits and alternatives for the proposed anesthesia with the patient or authorized representative who has indicated his/her understanding and acceptance.     Plan Discussed with: CRNA and Surgeon  Anesthesia Plan Comments:         Anesthesia Quick Evaluation

## 2012-12-05 NOTE — Anesthesia Postprocedure Evaluation (Signed)
Anesthesia Post Note  Patient: [name]  Procedure(s) Performed: Procedure(s) (LRB): EXCISION MIDLINE NECK MASS (N/A)  Anesthesia type: general  Patient location: PACU  Post pain: Pain level controlled  Post assessment: Patient's Cardiovascular Status Stable  Last Vitals:  Filed Vitals:   12/05/12 1300  BP: 124/55  Pulse: 71  Temp: 36.6 C  Resp: 20    Post vital signs: Reviewed and stable  Level of consciousness: sedated  Complications: No apparent anesthesia complications

## 2012-12-05 NOTE — Transfer of Care (Signed)
Immediate Anesthesia Transfer of Care Note  Patient: [name]  Procedure(s) Performed: Procedure(s): EXCISION MIDLINE NECK MASS (N/A)  Patient Location: PACU  Anesthesia Type:General  Level of Consciousness: awake and alert   Airway & Oxygen Therapy: Patient Spontanous Breathing and Patient connected to face mask oxygen  Post-op Assessment: Report given to PACU RN and Post -op Vital signs reviewed and stable  Post vital signs: Reviewed and stable  Complications: No apparent anesthesia complications

## 2012-12-05 NOTE — H&P (Signed)
H&P Update  Pt's original H&P dated 11/23/12 reviewed and placed in chart (to be scanned).  I personally examined the patient today.  No change in health. Proceed with excision of neck mass.

## 2012-12-05 NOTE — Brief Op Note (Signed)
12/05/2012  10:51 AM  PATIENT:  [name]  67 y.o. male  PRE-OPERATIVE DIAGNOSIS:  NECK MASS  POST-OPERATIVE DIAGNOSIS:  NECK MASS  PROCEDURE:   Excisional biopsy of neck mass  SURGEON:  Surgeon(s) and Role:    * Sui W Kalah Pflum, MD - Primary  PHYSICIAN ASSISTANT:   ASSISTANTS: none   ANESTHESIA:   general  EBL:  Total I/O In: 800 [I.V.:800] Out: -   BLOOD ADMINISTERED:none  DRAINS: none   LOCAL MEDICATIONS USED:  LIDOCAINE   SPECIMEN:  Source of Specimen:  Neck mass  DISPOSITION OF SPECIMEN:  PATHOLOGY  COUNTS:  YES  TOURNIQUET:  * No tourniquets in log *  DICTATION: .Other Dictation: Dictation Number [id number]  PLAN OF CARE: Discharge to home after PACU  PATIENT DISPOSITION:  PACU - hemodynamically stable.   Delay start of Pharmacological VTE agent (>24hrs) due to surgical blood loss or risk of bleeding: not applicable

## 2012-12-06 ENCOUNTER — Encounter (HOSPITAL_BASED_OUTPATIENT_CLINIC_OR_DEPARTMENT_OTHER): Payer: Self-pay | Admitting: Otolaryngology

## 2012-12-08 ENCOUNTER — Telehealth (HOSPITAL_COMMUNITY): Payer: Self-pay

## 2012-12-08 ENCOUNTER — Encounter (HOSPITAL_COMMUNITY): Payer: Self-pay

## 2012-12-08 ENCOUNTER — Encounter (HOSPITAL_COMMUNITY): Payer: Medicare Other | Attending: Hematology and Oncology

## 2012-12-08 VITALS — BP 133/71 | HR 64 | Temp 98.0°F | Resp 20 | Wt 213.2 lb

## 2012-12-08 DIAGNOSIS — C8589 Other specified types of non-Hodgkin lymphoma, extranodal and solid organ sites: Secondary | ICD-10-CM

## 2012-12-08 DIAGNOSIS — C9112 Chronic lymphocytic leukemia of B-cell type in relapse: Secondary | ICD-10-CM

## 2012-12-08 DIAGNOSIS — C9110 Chronic lymphocytic leukemia of B-cell type not having achieved remission: Secondary | ICD-10-CM

## 2012-12-08 LAB — CBC WITH DIFFERENTIAL/PLATELET
Basophils Absolute: 0 10*3/uL (ref 0.0–0.1)
Eosinophils Absolute: 0.2 10*3/uL (ref 0.0–0.7)
Lymphs Abs: 0.5 10*3/uL — ABNORMAL LOW (ref 0.7–4.0)
MCHC: 32.9 g/dL (ref 30.0–36.0)
MCV: 96.9 fL (ref 78.0–100.0)
Monocytes Relative: 25 % — ABNORMAL HIGH (ref 3–12)
Platelets: 97 10*3/uL — ABNORMAL LOW (ref 150–400)
RDW: 15.5 % (ref 11.5–15.5)
WBC: 1.1 10*3/uL — CL (ref 4.0–10.5)

## 2012-12-08 LAB — COMPREHENSIVE METABOLIC PANEL
ALT: 27 U/L (ref 0–53)
Albumin: 3.5 g/dL (ref 3.5–5.2)
Alkaline Phosphatase: 114 U/L (ref 39–117)
BUN: 9 mg/dL (ref 6–23)
Chloride: 103 mEq/L (ref 96–112)
GFR calc Af Amer: 69 mL/min — ABNORMAL LOW (ref >90)
Glucose, Bld: 106 mg/dL — ABNORMAL HIGH (ref 70–99)
Potassium: 4.1 mEq/L (ref 3.5–5.1)
Total Bilirubin: 0.4 mg/dL (ref 0.3–1.2)

## 2012-12-08 NOTE — Progress Notes (Signed)
[facility] [phone]   [phone]  OFFICE PROGRESS NOTE  [name], MD [address] [place] [address]  DIAGNOSIS:  1. Relapses CLL. Stage IV. 2. History of history of Hodgkin's lymphoma mixed cellularity type  INTERVAL HISTORY:  This gentleman is a 67 year old man diagnosed with stage IV CLL in 2004 and Hodgkin's lymphoma  in September of 2006 stage III B.  He was treated with 6 cycles of ABVD (Bleomycin was dropped through the first cycle due to decreasing DLCO and replaced with VP-16) According to his records his CLL was treated with 3 cycles of FCR. Since then he was followed and was felt to be without evidence of recurrence for a while. Most recently  He was treated with Bendamustine/Rituxan from 10/13/2011 through 03/17/2012 for his CLL. He was put on maintenance Rituxan and received only one treatment on 07/18/2012.   Since finishing treatment patient's blood counts have now recovered. And that his 'energy' is '60%' and his appetite is '70%' and that he is eating several small meals in a day.  He reports occasional shortness of breath which is unchanged.  He denies night sweats or fever. His weight is stable.  He recently had excisional biopsy of neck mass by Dr Suszanne Conners on 12/05/2012. He reports pain  and swelling around the OpSite and  otherwise no new problems. Pathology shows; BENIGN SOFT TISSUE WITH NECROTIZING GRANULOMATOUS INFLAMMATION and NEGATIVE FOR MALIGNANCY.  He was accompanied by his wife who is also a cancer patient.  MEDICAL HISTORY: Past Medical History  Diagnosis Date  . Aortic stenosis   . Atrial fibrillation   . Coronary atherosclerosis of native coronary artery   . Hyperlipidemia   . Essential hypertension, benign   . BPH (benign prostatic hypertrophy)   . Elevated WBC count   . Arthritis   . Acid reflux   . Hypogammaglobulinemia   . Pre-diabetes 08/04/2011  . CLL (chronic lymphoblastic leukemia)    . Hodgkin's disease   . Kidney stones   . Wears glasses   . Wears dentures   . HOH (hard of hearing)     ALLERGIES:  has No Known Allergies.  MEDICATIONS:  Current Outpatient Prescriptions  Medication Sig Dispense Refill  . acyclovir (ZOVIRAX) 200 MG capsule Take 1 capsule (200 mg total) by mouth 2 (two) times daily.  60 capsule  2  . amoxicillin (AMOXIL) 875 MG tablet Take 1 tablet (875 mg total) by mouth 2 (two) times daily.  10 tablet  0  . aspirin EC 81 MG tablet Take 81 mg by mouth daily.      . folic acid (FOLVITE) 1 MG tablet Take 1 mg by mouth daily.      Marland Kitchen HYDROcodone-acetaminophen (NORCO/VICODIN) 5-325 MG per tablet Take 1 tablet by mouth every 4 (four) hours as needed for pain.  15 tablet  0  . metoprolol (LOPRESSOR) 50 MG tablet Take 50 mg by mouth 2 (two) times daily.      . Multiple Vitamins-Minerals (CENTRUM SILVER ULTRA MENS PO) Take 1 tablet by mouth every morning.       Marland Kitchen OMEGA 3 1000 MG CAPS Take 1,000 mg by mouth daily.       Marland Kitchen omeprazole (PRILOSEC) 20 MG capsule Take 20 mg by mouth every morning.       . sulfamethoxazole-trimethoprim (BACTRIM DS,SEPTRA DS) 800-160 MG per tablet Take 1 tablet by mouth every Monday, Wednesday, and Friday.      Marland Kitchen  rosuvastatin (CRESTOR) 20 MG tablet Take 1 tablet (20 mg total) by mouth at bedtime.  30 tablet  11   No current facility-administered medications for this visit.   Facility-Administered Medications Ordered in Other Visits  Medication Dose Route Frequency Provider Last Rate Last Dose  . sodium chloride 0.9 % injection 10 mL  10 mL Intracatheter PRN Randall An, MD   10 mL at 02/17/12 1019    SURGICAL HISTORY:  Past Surgical History  Procedure Laterality Date  . Eye surgery    . 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 Esophagogastroduodenoscopy      scrapping of throat and stretching  . Lithotripsy    . Bone marrow aspiration    . Portacath placement    . Coronary artery bypass graft   10/09    SVG to RCA  . Colon surgery    . Dental surgery  08/2012  . Multiple tooth extractions    . Mass excision N/A 12/05/2012    Procedure: EXCISION MIDLINE NECK MASS;  Surgeon: Darletta Moll, MD;  Location: [facility];  Service: ENT;  Laterality: N/A;     REVIEW OF SYSTEMS: 14 point review of system is as in the history above otherwise negative.  PHYSICAL EXAMINATION:  Blood pressure 133/71, pulse 64, temperature 98 F (36.7 C), temperature source Oral, resp. rate 20, weight 213 lb 3 oz (96.701 kg). GENERAL: No acute distress. SKIN:  No rashes or significant lesions . Scabs on lower extremities anterior band of the abdomen and back of the neck( insect bite per patient). HEAD: Normocephalic, No masses, lesions, tenderness or abnormalities  EYES: Conjunctiva are pink and non-injected and no jaundice ENT: External ears normal ,lips, buccal mucosa, and tongue normal and mucous membranes are moist . No evidence of thrush. Freshly healing incision in the central aspect of the lower jaw with mild erythema and tenderness.  No discharge. LYMPH: No palpable lymphadenopathy, in the neck, supraclavicular areas or axilla. LUNGS: Clear to auscultation , no crackles or wheezes HEART: regular rate & rhythm, no murmurs, no gallops, S1 normal and S2 normal and no S3. ABDOMEN: Abdomen soft, non-tender, no masses or organomegaly and no hepatosplenomegaly palpable EXTREMITIES: No edema, no skin discoloration or tenderness NEURO: Alert & oriented      LABORATORY DATA: Lab Results  Component Value Date   WBC 1.3* 11/06/2012   HGB 10.5* 12/05/2012   HCT 31.0* 12/05/2012   MCV 99.4 11/06/2012   PLT 115* 11/06/2012      Chemistry      Component Value Date/Time   NA 141 12/05/2012 0833   K 3.7 12/05/2012 0833   CL 104 12/05/2012 0833   CO2 27 11/06/2012 1241   BUN 7 12/05/2012 0833   CREATININE 1.10 12/05/2012 0833   CREATININE 1.29 05/10/2011 1541      Component Value Date/Time   CALCIUM 8.8  11/06/2012 1241   ALKPHOS 96 11/06/2012 1241   AST 36 11/06/2012 1241   ALT 30 11/06/2012 1241   BILITOT 0.4 11/06/2012 1241       RADIOGRAPHIC STUDIES: No results found.   ASSESSMENT:  Mr. Slape has CLL, Status  post 6 cycles of Bendamustine Rituxan completed in December 2014 and one cycle of maintenance Rituxan in April 2014. Since completing treatment in December 2013 patient has remained the cytopenic.  Particularly  concerning is his continued neutropenia. In the past he has received treatment with FCR 3 cycles  and was also treated with ABVD (  Bleomycin change to Vp16 after 1 cycle) for Hodgkin's lymphoma in 2006.  PLAN:  1. CBC today. 2. Stop maintenance Rituxan; could be contributing to cytopenias,its role in CLL is poorly defined. 3. Return to clinic in 4 weeks with repeat CBC at that time. 4. Patient instructed to stop acyclovir and Bactrim , given that the patient was not treated with fludarabine recently. 5. If his counts continue to be low, then consideration a bone marrow aspiration and biopsy would be reasonable to see if he has aplastic marrow or refractory leukemia.  Given that he has been heavily treated with multiple chemotherapy in the past , I anticipate aplastic marrow, however secondary MDS is a strong possibility.   All questions were satisfactorily answered. Patient knows to call if  any concern arises.  I spent more than 50 % counseling the patient face to face. The total time spent in the appointment was 30 minutes.   Sherral Hammers, MD FACP. Hematology/Oncology.  Addendum: Labs today.  Results for ZEKE, AKER (MRN [id number]) as of 12/08/2012 17:37  Ref. Range 11/06/2012 12:41  WBC Latest Range: 4.0-10.5 K/uL 1.3 (LL)  RBC Latest Range: 4.22-5.81 MIL/uL 3.21 (L)  Hemoglobin Latest Range: 13.0-17.0 g/dL 65.7 (L)  HCT Latest Range: 39.0-52.0 % 31.9 (L)  MCV Latest Range: 78.0-100.0 fL 99.4  MCH Latest Range: 26.0-34.0 pg 32.7  MCHC Latest Range:  30.0-36.0 g/dL 84.6  RDW Latest Range: 11.5-15.5 % 17.1 (H)  Platelets Latest Range: 150-400 K/uL 115 (L)  Neutrophils Relative % Latest Range: 43-77 % 21 (L)  Lymphocytes Relative Latest Range: 12-46 % 57 (H)  Monocytes Relative Latest Range: 3-12 % 20 (H)  Eosinophils Relative Latest Range: 0-5 % 0  Basophils Relative Latest Range: 0-1 % 2 (H)  NEUT# Latest Range: 1.7-7.7 K/uL 0.3 (L)  Lymphocytes Absolute Latest Range: 0.7-4.0 K/uL 0.7  Monocytes Absolute Latest Range: 0.1-1.0 K/uL 0.3  Eosinophils Absolute Latest Range: 0.0-0.7 K/uL 0.0  Basophils Absolute Latest Range: 0.0-0.1 K/uL 0.0  WBC Morphology No range found ATYPICAL LYMPHOCYTES

## 2012-12-08 NOTE — Progress Notes (Signed)
[name] presented for labwork. Labs per MD order drawn via Peripheral Line 23 gauge needle inserted in left AC  Good blood return present. Procedure without incident.  Needle removed intact. Patient tolerated procedure well.  CRITICAL VALUE ALERT Critical value received:  WBC 1.1 Date of notification:  12/08/2012 Time of notification: 1300 Critical value read back:  yes Nurse who received alert:  Tobie Lords, RN MD notified :  Dr. Sharia Reeve.

## 2012-12-08 NOTE — Telephone Encounter (Signed)
Patient notified that WBC is low and that he needs to follow neutropenic precautions.  Instructed to avoid being around anyone that is sick, utilize good hand washing, if fever develops needs to seek medical care immediately.  Verbalized understanding of instructions.

## 2012-12-08 NOTE — Patient Instructions (Addendum)
[facility] Discharge Instructions  RECOMMENDATIONS MADE BY THE CONSULTANT AND ANY TEST RESULTS WILL BE SENT TO YOUR REFERRING PHYSICIAN.  EXAM FINDINGS BY THE PHYSICIAN TODAY AND SIGNS OR SYMPTOMS TO REPORT TO CLINIC OR PRIMARY PHYSICIAN: Exam and discussion by Dr. Sharia Reeve.  We will check your blood counts to see how your white blood count is doing.  We are going to stop the Rituxan.  MEDICATIONS PRESCRIBED:  Stop the Sulfamethoxazole-trimethoprim (Bactrim) Stop the Acyclovir   INSTRUCTIONS GIVEN AND DISCUSSED: Report fevers, chills, unexplained weight loss, etc.  SPECIAL INSTRUCTIONS/FOLLOW-UP: Port flushes every 6 weeks and follow-up in 1 month.  Thank you for choosing [facility] to provide your oncology and hematology care.  To afford each patient quality time with our providers, please arrive at least 15 minutes before your scheduled appointment time.  With your help, our goal is to use those 15 minutes to complete the necessary work-up to ensure our physicians have the information they need to help with your evaluation and healthcare recommendations.    Effective January 1st, 2014, we ask that you re-schedule your appointment with our physicians should you arrive 10 or more minutes late for your appointment.  We strive to give you quality time with our providers, and arriving late affects you and other patients whose appointments are after yours.    Again, thank you for choosing [facility].  Our hope is that these requests will decrease the amount of time that you wait before being seen by our physicians.       _____________________________________________________________  Should you have questions after your visit to [facility], please contact our office at [phone] between the hours of 8:30 a.m. and 5:00 p.m.  Voicemails left after 4:30 p.m. will not be returned until the following business day.  For prescription  refill requests, have your pharmacy contact our office with your prescription refill request.

## 2012-12-14 ENCOUNTER — Ambulatory Visit (INDEPENDENT_AMBULATORY_CARE_PROVIDER_SITE_OTHER): Payer: Medicare Other | Admitting: Otolaryngology

## 2012-12-27 HISTORY — PX: TONGUE BIOPSY: SHX1075

## 2013-01-04 ENCOUNTER — Other Ambulatory Visit (HOSPITAL_COMMUNITY): Payer: Medicare Other

## 2013-01-05 ENCOUNTER — Telehealth (HOSPITAL_COMMUNITY): Payer: Self-pay | Admitting: Oncology

## 2013-01-05 ENCOUNTER — Ambulatory Visit (HOSPITAL_COMMUNITY): Payer: Medicare Other | Admitting: Oncology

## 2013-01-05 NOTE — Telephone Encounter (Signed)
Erroneous encounter

## 2013-01-11 ENCOUNTER — Other Ambulatory Visit: Payer: Self-pay | Admitting: Radiology

## 2013-01-11 ENCOUNTER — Encounter (HOSPITAL_COMMUNITY): Payer: Self-pay

## 2013-01-11 ENCOUNTER — Encounter (HOSPITAL_COMMUNITY): Payer: Medicare Other | Attending: Oncology

## 2013-01-11 VITALS — BP 113/72 | HR 73 | Temp 98.0°F | Resp 20 | Wt 209.1 lb

## 2013-01-11 DIAGNOSIS — D709 Neutropenia, unspecified: Secondary | ICD-10-CM | POA: Insufficient documentation

## 2013-01-11 DIAGNOSIS — K8020 Calculus of gallbladder without cholecystitis without obstruction: Secondary | ICD-10-CM | POA: Insufficient documentation

## 2013-01-11 DIAGNOSIS — D61818 Other pancytopenia: Secondary | ICD-10-CM

## 2013-01-11 DIAGNOSIS — K121 Other forms of stomatitis: Secondary | ICD-10-CM

## 2013-01-11 DIAGNOSIS — C8190 Hodgkin lymphoma, unspecified, unspecified site: Secondary | ICD-10-CM | POA: Insufficient documentation

## 2013-01-11 DIAGNOSIS — C9110 Chronic lymphocytic leukemia of B-cell type not having achieved remission: Secondary | ICD-10-CM | POA: Insufficient documentation

## 2013-01-11 DIAGNOSIS — K1370 Unspecified lesions of oral mucosa: Secondary | ICD-10-CM

## 2013-01-11 DIAGNOSIS — Z23 Encounter for immunization: Secondary | ICD-10-CM

## 2013-01-11 LAB — CBC WITH DIFFERENTIAL/PLATELET
Basophils Absolute: 0 10*3/uL (ref 0.0–0.1)
Eosinophils Absolute: 0.3 10*3/uL (ref 0.0–0.7)
HCT: 32.5 % — ABNORMAL LOW (ref 39.0–52.0)
Lymphs Abs: 0.5 10*3/uL — ABNORMAL LOW (ref 0.7–4.0)
MCH: 31.3 pg (ref 26.0–34.0)
MCHC: 33.8 g/dL (ref 30.0–36.0)
MCV: 92.3 fL (ref 78.0–100.0)
Monocytes Absolute: 0.3 10*3/uL (ref 0.1–1.0)
Monocytes Relative: 21 % — ABNORMAL HIGH (ref 3–12)
Neutro Abs: 0.2 10*3/uL — ABNORMAL LOW (ref 1.7–7.7)
Platelets: 109 10*3/uL — ABNORMAL LOW (ref 150–400)
RDW: 15.2 % (ref 11.5–15.5)
WBC: 1.3 10*3/uL — CL (ref 4.0–10.5)

## 2013-01-11 MED ORDER — SULFAMETHOXAZOLE-TRIMETHOPRIM 800-160 MG PO TABS: 1.0000 | ORAL_TABLET | ORAL | Status: DC

## 2013-01-11 MED ORDER — HEPARIN SOD (PORK) LOCK FLUSH 100 UNIT/ML IV SOLN
500.0000 [IU] | Freq: Once | INTRAVENOUS | Status: AC
  Administered 2013-01-11: 500 [IU] via INTRAVENOUS

## 2013-01-11 MED ORDER — INFLUENZA VAC SPLIT QUAD 0.5 ML IM SUSP
0.5000 mL | Freq: Once | INTRAMUSCULAR | Status: AC
  Administered 2013-01-11: 0.5 mL via INTRAMUSCULAR
  Filled 2013-01-11: qty 0.5

## 2013-01-11 MED ORDER — HEPARIN SOD (PORK) LOCK FLUSH 100 UNIT/ML IV SOLN
INTRAVENOUS | Status: AC
  Filled 2013-01-11: qty 5

## 2013-01-11 MED ORDER — SODIUM CHLORIDE 0.9 % IJ SOLN
10.0000 mL | INTRAMUSCULAR | Status: DC | PRN
  Administered 2013-01-11: 10 mL via INTRAVENOUS

## 2013-01-11 NOTE — Progress Notes (Signed)
White sore with redness surrounding the sore to the bottom of the left tongue. Patient states that he can not feel it. However, he does state that it feels like he has a mouth sore to the left side of mouth. Patient states that it burns sometimes when he eats spicy foods. Patient states that mouth stays dry and that his left jaw is popping some. Patient states that the words he speaks come out of his mouth slowly almost like they get stuck.  Patient thinks he has flea bites to his rt neck. Multiple reddened areas to neck that itch per pt.   Earl Gala presented for Portacath access and flush. Proper placement of portacath confirmed by CXR. Portacath located RT chest wall accessed with  H 20 needle. Good blood return present. Portacath flushed with 20ml NS and 500U/60ml Heparin and needle removed intact. Procedure without incident. Patient tolerated procedure well.

## 2013-01-11 NOTE — Patient Instructions (Signed)
[facility] Discharge Instructions  RECOMMENDATIONS MADE BY THE CONSULTANT AND ANY TEST RESULTS WILL BE SENT TO YOUR REFERRING PHYSICIAN.  EXAM FINDINGS BY THE PHYSICIAN TODAY AND SIGNS OR SYMPTOMS TO REPORT TO CLINIC OR PRIMARY PHYSICIAN: Exam and findings as discussed by Dr. Zigmund Daniel.  We will get a consult with Dr. Suszanne Conners for evaluation of the lesion on your tongue and will also get a bone marrow biopsy and aspiration.  Will call you with the dates and times of the appointments. Will give you a flu vaccine today.  MEDICATIONS PRESCRIBED:  Restart the Acyclovir and Bactrim  INSTRUCTIONS/FOLLOW-UP: Follow-up in 4 weeks.  Thank you for choosing [facility] to provide your oncology and hematology care.  To afford each patient quality time with our providers, please arrive at least 15 minutes before your scheduled appointment time.  With your help, our goal is to use those 15 minutes to complete the necessary work-up to ensure our physicians have the information they need to help with your evaluation and healthcare recommendations.    Effective January 1st, 2014, we ask that you re-schedule your appointment with our physicians should you arrive 10 or more minutes late for your appointment.  We strive to give you quality time with our providers, and arriving late affects you and other patients whose appointments are after yours.    Again, thank you for choosing [facility].  Our hope is that these requests will decrease the amount of time that you wait before being seen by our physicians.       _____________________________________________________________  Should you have questions after your visit to [facility], please contact our office at [phone] between the hours of 8:30 a.m. and 5:00 p.m.  Voicemails left after 4:30 p.m. will not be returned until the following business day.  For prescription refill requests, have your pharmacy  contact our office with your prescription refill request.

## 2013-01-11 NOTE — Progress Notes (Signed)
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 Anemia    Pancytopenia    CURRENT THERAPY: Watchful expectation having discontinued Rituxan, acyclovir, and Bactrim.  INTERVAL HISTORY: [name] 67 y.o. male returns for followup of pancytopenia having been treated in the past for Hodgkin's disease and chronic lymphocytic leukemia.  Complaining of 3 bites on his upper torso and neck. Denies any fever or night sweats. Also denies easy satiety, cough, wheezing, lower extremity swelling or redness, abdominal pain, fever, night sweats, cough, wheezing, headache, or seizures. MEDICAL HISTORY: Past Medical History  Diagnosis Date  . Aortic stenosis   . Atrial fibrillation   . Coronary atherosclerosis of native coronary artery   . Hyperlipidemia   . Essential hypertension, benign   . BPH (benign prostatic hypertrophy)   . Elevated WBC count   . Arthritis   . Acid reflux   . Hypogammaglobulinemia   . Pre-diabetes 08/04/2011  . CLL (chronic lymphoblastic leukemia)   . Hodgkin's disease   . Kidney stones   . Wears glasses   . Wears dentures   . HOH (hard of hearing)     INTERIM HISTORY: has HODGKIN'S DISEASE; CLL; HYPERLIPIDEMIA; OBESITY; CORONARY ATHEROSCLEROSIS NATIVE CORONARY ARTERY; AORTIC STENOSIS; Atrial fibrillation; OTHER DYSPHAGIA; AORTIC VALVE REPLACEMENT, HX OF; Essential hypertension, benign; GERD (gastroesophageal reflux disease); Thrombocytopenia; Hypogammaglobulinemia, acquired; Anemia; Tachyarrhythmia; Pre-diabetes; Dental infection; Piles (hemorrhoids); Periorbital cellulitis of right eye; Reduced vision; Routine general medical examination at a health care facility; and Neck  mass on his problem list.   Stage IV CLL in 2004 and Hodgkin's lymphoma in September of 2006 stage III B.  He was treated with 6 cycles of ABVD (Bleomycin was dropped through the first cycle due to decreasing DLCO and replaced with VP-16)  According to his records his CLL was treated with 3 cycles of FCR. Since then he was followed and was felt to be without evidence of recurrence for a while. Most recently He was treated with Bendamustine/Rituxan from 10/13/2011 through 03/17/2012 for his CLL. He was put on maintenance Rituxan and received only one treatment on 07/18/2012  ALLERGIES:  has No Known Allergies.  MEDICATIONS: has a current medication list which includes the following prescription(s): aspirin ec, folic acid, metoprolol, multiple vitamins-minerals, omega 3, omeprazole, acyclovir, hydrocodone-acetaminophen, rosuvastatin, and sulfamethoxazole-trimethoprim.  SURGICAL HISTORY:  Past Surgical History  Procedure Laterality Date  . Eye surgery    . 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 Esophagogastroduodenoscopy      scrapping of throat and stretching  . Lithotripsy    . Bone marrow aspiration    . Portacath placement    . 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 Colon surgery    . Dental surgery  08/2012  . Multiple tooth extractions    . Mass excision N/A 12/05/2012    Procedure: EXCISION MIDLINE NECK MASS;  Surgeon: Darletta Moll, MD;  Location: [facility];  Service: ENT;  Laterality: N/A;    FAMILY HISTORY: family history includes Blindness in his mother; Cancer in his father; Heart failure in his mother.  SOCIAL HISTORY:  reports that he has never smoked. He has never used smokeless tobacco.  He reports that he does not drink alcohol or use illicit drugs.  REVIEW OF SYSTEMS:  Other than that discussed above is noncontributory.  PHYSICAL EXAMINATION: ECOG PERFORMANCE STATUS: 1 - Symptomatic but completely ambulatory  Blood  pressure 113/72, pulse 73, temperature 98 F (36.7 C), temperature source Oral, resp. rate 20, weight 209 lb 1.6 oz (94.847 kg).  GENERAL:alert, no distress and comfortable SKIN: skin color, texture, turgor are normal, no rashes or significant lesions EYES: PERLA; Conjunctiva are pink and non-injected, sclera clear OROPHARYNX:no exudate, no erythema on lips, buccal mucosa. Foul hallitosis. 1 cm circumscribed white tumor mass left anterior to lower surface of the tongue. NECK: supple, thyroid normal size, non-tender, without nodularity. No masses CHEST: Increased AP diameter with no gynecomastia. Midline sternotomy scar well healed. LYMPH:  no palpable lymphadenopathy in the cervical, axillary or inguinal LUNGS: clear to auscultation and percussion with normal breathing effort HEART: regular rate & rhythm and no murmurs and no lower extremity edema ABDOMEN:abdomen soft, non-tender and normal bowel sounds MUSCULOSKELETAL:no cyanosis of digits and no clubbing. Range of motion normal.  NEURO: alert & oriented x 3 with fluent speech, no focal motor/sensory deficits   LABORATORY DATA: No visits with results within 30 Day(s) from this visit. Latest known visit with results is:  Office Visit on 12/08/2012  Component Date Value Range Status  . Sodium 12/08/2012 140  135 - 145 mEq/L Final  . Potassium 12/08/2012 4.1  3.5 - 5.1 mEq/L Final  . Chloride 12/08/2012 103  96 - 112 mEq/L Final  . CO2 12/08/2012 29  19 - 32 mEq/L Final  . Glucose, Bld 12/08/2012 106* 70 - 99 mg/dL Final  . BUN 40/98/1191 9  6 - 23 mg/dL Final  . Creatinine, Ser 12/08/2012 1.22  0.50 - 1.35 mg/dL Final  . Calcium 47/82/9562 9.3  8.4 - 10.5 mg/dL Final  . Total Protein 12/08/2012 5.8* 6.0 - 8.3 g/dL Final  . Albumin 13/10/6576 3.5  3.5 - 5.2 g/dL Final  . AST 46/96/2952 34  0 - 37 U/L Final  . ALT 12/08/2012 27  0 - 53 U/L Final  . Alkaline Phosphatase 12/08/2012 114  39 - 117 U/L Final  . Total Bilirubin 12/08/2012  0.4  0.3 - 1.2 mg/dL Final  . GFR calc non Af Amer 12/08/2012 59* >90 mL/min Final  . GFR calc Af Amer 12/08/2012 69* >90 mL/min Final   Comment: (NOTE)                          The eGFR has been calculated using the CKD EPI equation.                          This calculation has not been validated in all clinical situations.                          eGFR's persistently <90 mL/min signify possible Chronic Kidney                          Disease.  . WBC 12/08/2012 1.1* 4.0 - 10.5 K/uL Final   Comment: CRITICAL RESULT CALLED TO, READ BACK BY AND VERIFIED WITH:                          BURGESS,S AT 1500 ON 12/08/2012 BY BAUGHAM,M.  Marland Kitchen  RBC 12/08/2012 3.26* 4.22 - 5.81 MIL/uL Final  . Hemoglobin 12/08/2012 10.4* 13.0 - 17.0 g/dL Final  . HCT 40/98/1191 31.6* 39.0 - 52.0 % Final  . MCV 12/08/2012 96.9  78.0 - 100.0 fL Final  . MCH 12/08/2012 31.9  26.0 - 34.0 pg Final  . MCHC 12/08/2012 32.9  30.0 - 36.0 g/dL Final  . RDW 47/82/9562 15.5  11.5 - 15.5 % Final  . Platelets 12/08/2012 97* 150 - 400 K/uL Final  . Neutrophils Relative % 12/08/2012 6* 43 - 77 % Final  . Lymphocytes Relative 12/08/2012 44  12 - 46 % Final  . Monocytes Relative 12/08/2012 25* 3 - 12 % Final  . Eosinophils Relative 12/08/2012 22* 0 - 5 % Final  . Basophils Relative 12/08/2012 3* 0 - 1 % Final  . Neutro Abs 12/08/2012 0.1* 1.7 - 7.7 K/uL Final  . Lymphs Abs 12/08/2012 0.5* 0.7 - 4.0 K/uL Final  . Monocytes Absolute 12/08/2012 0.3  0.1 - 1.0 K/uL Final  . Eosinophils Absolute 12/08/2012 0.2  0.0 - 0.7 K/uL Final  . Basophils Absolute 12/08/2012 0.0  0.0 - 0.1 K/uL Final  . WBC Morphology 12/08/2012 WHITE COUNT CONFIRMED ON SMEAR   Final   DIFFERENTIAL CONFIRMED BY SMEAR  . Smear Review 12/08/2012 PLATELET COUNT CONFIRMED BY SMEAR   Final   PLATELETS APPEAR DECREASED  . LDH 12/08/2012 258* 94 - 250 U/L Final    PATHOLOGY: FINAL DIAGNOSIS Diagnosis Soft tissue, NOS, neck - BENIGN SOFT TISSUE WITH NECROTIZING  GRANULOMATOUS INFLAMMATION, SEE COMMENT. - NEGATIVE FOR MALIGNANCY. Microscopic Comment Slide sections demonstrate nodular necrotizing granulomatous inflammation with abscess formation and dense fibrosis. Given the morphology present, an infectious etiology is likely until proven otherwise. PAS, GMS and AFB stains are pending and will be reported in an addendum. (CR:kh 12-06-12) [place] RUND DO Pathologist, Electronic Signature (Case signed 12/06/2012) Specimen Gross and Clinical Information Specimen(s) Obtained: Soft tissue, NOS, neck Specimen Clinical Information Midline neck mass (jmc) 1 of 2 FINAL for SAMMUEL, BLICK [phone]) Gross Received in saline and consists of a 1.8 x 1.4 x 0.9 cm piece of tan pink soft tissue. Sectioning reveals a tan pink cut surface with a central 0.5 cm tan white area. A portion of the specimen is submitted in RPMI for possible Flow cytometry, and the remaining specimen is entirely submitted in two cassettes. (KL:gt, 12/05/12) Stain(s) used in Diagnosis: The following stain(s) were used in diagnosing the case: Acid Fast Bacillus Stain, GMS, *PAS/F Stain. The control(s) stained appropriately. Report signed out from the following location(s) Engineer, production and Interpretation performed at [facility] [address] [place], [place], [place] [address]. CLIA #: [id number], 2 of  Urinalysis    Component Value Date/Time   COLORURINE YELLOW 02/28/2012 0116   APPEARANCEUR CLEAR 02/28/2012 0116   LABSPEC 1.020 02/28/2012 0116   PHURINE 5.5 02/28/2012 0116   GLUCOSEU NEGATIVE 02/28/2012 0116   HGBUR NEGATIVE 02/28/2012 0116   BILIRUBINUR NEGATIVE 02/28/2012 0116   KETONESUR NEGATIVE 02/28/2012 0116   PROTEINUR NEGATIVE 02/28/2012 0116   UROBILINOGEN 0.2 02/28/2012 0116   NITRITE NEGATIVE 02/28/2012 0116   LEUKOCYTESUR NEGATIVE 02/28/2012 0116    RADIOGRAPHIC STUDIES: 09/21/2012 Comparison: 03/27/2012  CT CHEST  Findings: Small mediastinal lymph  nodes are not pathologically  enlarged by size criteria. Aortic and coronary artery  atherosclerotic calcifications noted. Prior CABG.  Borderline distal esophageal wall thickening observed. Lungs appear  clear.  IMPRESSION:  1. No specific findings of malignancy in the  chest.  2. Distal esophageal mild wall thickening, query mild esophagitis.  3. Atherosclerosis.  CT ABDOMEN AND PELVIS  Findings: Dependent gallstones noted in the gallbladder. Mild  diffuse hepatic steatosis. Mild splenomegaly, splenic volume 460  ml. This is improved compared the prior.  Adrenal glands and pancreas normal. Retroaortic left renal vein.  Kidneys unremarkable. No pathologic adenopathy observed.  Bilateral inguinal hernias contain adipose tissue.  Degenerative disc disease suspected at L3-4 and L4-5.  IMPRESSION:  1. Improved and nearly resolved splenomegaly, current splenic  volume 460 ml, previously 724 ml.  2. Cholelithiasis.  Original Report Authenticated By: Gaylyn Rong   ASSESSMENT: #1. Pancytopenia either due to hypoplastic marrow from previous chemotherapy versus worsening of chronic lymphocytic leukemia. Legrand Rams the former. #2. 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 #3. Chronic lymphocytic leukemia, possibly worsening with bone marrow involvement #4. Hodgkin's disease, nodular sclerosis type, no evidence of disease.   PLAN: #1. Resume acyclovir and Bactrim. #2. Referral to Dr. Suszanne Conners for evaluation of left tongue lesion. #3. Bone marrow aspiration and biopsy by interventional radiology to determine etiology of pancytopenia. #4. Call if develops fever. #5. Followup in 4 weeks.   All questions were answered. The patient knows to call the clinic with any problems, questions or concerns. We can certainly see the patient much sooner if necessary.   I spent 25inutes counseling the patient face to face. The total time spent in the appointment was 30inutes.     Maurilio Lovely, MD 01/11/2013 1:45 PM

## 2013-01-12 ENCOUNTER — Telehealth (HOSPITAL_COMMUNITY): Payer: Self-pay

## 2013-01-12 ENCOUNTER — Other Ambulatory Visit: Payer: Self-pay | Admitting: Radiology

## 2013-01-12 NOTE — Telephone Encounter (Signed)
Call back from patient verifying that he received the message regarding the Bone Marrow Biopsy and Aspiration.

## 2013-01-14 ENCOUNTER — Other Ambulatory Visit: Payer: Self-pay | Admitting: Cardiology

## 2013-01-15 ENCOUNTER — Ambulatory Visit (HOSPITAL_COMMUNITY): Payer: Medicare Other

## 2013-01-15 ENCOUNTER — Ambulatory Visit (HOSPITAL_COMMUNITY): Admission: RE | Admit: 2013-01-15 | Payer: Medicare Other | Source: Ambulatory Visit

## 2013-01-16 ENCOUNTER — Encounter (HOSPITAL_COMMUNITY): Payer: Medicare Other

## 2013-01-17 ENCOUNTER — Telehealth: Payer: Self-pay | Admitting: Adult Health

## 2013-01-17 MED ORDER — METOPROLOL TARTRATE 50 MG PO TABS: ORAL_TABLET | ORAL | Status: DC

## 2013-01-17 NOTE — Telephone Encounter (Signed)
Needs refill on Metoprolol sent to pharmacy.  Patient scheduled f/u with Samara Deist for 10/28/tgs

## 2013-01-17 NOTE — Telephone Encounter (Signed)
Medication sent via escribe.  

## 2013-01-18 ENCOUNTER — Encounter (HOSPITAL_COMMUNITY): Payer: Self-pay | Admitting: Pharmacy Technician

## 2013-01-19 ENCOUNTER — Other Ambulatory Visit: Payer: Self-pay | Admitting: Radiology

## 2013-01-22 ENCOUNTER — Encounter (HOSPITAL_COMMUNITY): Payer: Self-pay

## 2013-01-22 ENCOUNTER — Ambulatory Visit (HOSPITAL_COMMUNITY)
Admission: RE | Admit: 2013-01-22 | Discharge: 2013-01-22 | Disposition: A | Discharge status: Home / Self Care | Payer: Medicare Other | Source: Ambulatory Visit | Attending: Hematology and Oncology | Admitting: Hematology and Oncology

## 2013-01-22 DIAGNOSIS — R059 Cough, unspecified: Secondary | ICD-10-CM | POA: Insufficient documentation

## 2013-01-22 DIAGNOSIS — D709 Neutropenia, unspecified: Secondary | ICD-10-CM

## 2013-01-22 DIAGNOSIS — K1321 Leukoplakia of oral mucosa, including tongue: Secondary | ICD-10-CM | POA: Insufficient documentation

## 2013-01-22 DIAGNOSIS — C8190 Hodgkin lymphoma, unspecified, unspecified site: Secondary | ICD-10-CM | POA: Insufficient documentation

## 2013-01-22 DIAGNOSIS — I4891 Unspecified atrial fibrillation: Secondary | ICD-10-CM | POA: Insufficient documentation

## 2013-01-22 DIAGNOSIS — I10 Essential (primary) hypertension: Secondary | ICD-10-CM | POA: Insufficient documentation

## 2013-01-22 DIAGNOSIS — R51 Headache: Secondary | ICD-10-CM | POA: Insufficient documentation

## 2013-01-22 DIAGNOSIS — Z954 Presence of other heart-valve replacement: Secondary | ICD-10-CM | POA: Insufficient documentation

## 2013-01-22 DIAGNOSIS — K219 Gastro-esophageal reflux disease without esophagitis: Secondary | ICD-10-CM | POA: Insufficient documentation

## 2013-01-22 DIAGNOSIS — Z951 Presence of aortocoronary bypass graft: Secondary | ICD-10-CM | POA: Insufficient documentation

## 2013-01-22 DIAGNOSIS — R05 Cough: Secondary | ICD-10-CM | POA: Insufficient documentation

## 2013-01-22 DIAGNOSIS — N400 Enlarged prostate without lower urinary tract symptoms: Secondary | ICD-10-CM | POA: Insufficient documentation

## 2013-01-22 DIAGNOSIS — C9110 Chronic lymphocytic leukemia of B-cell type not having achieved remission: Secondary | ICD-10-CM | POA: Insufficient documentation

## 2013-01-22 DIAGNOSIS — E785 Hyperlipidemia, unspecified: Secondary | ICD-10-CM | POA: Insufficient documentation

## 2013-01-22 DIAGNOSIS — I2510 Atherosclerotic heart disease of native coronary artery without angina pectoris: Secondary | ICD-10-CM | POA: Insufficient documentation

## 2013-01-22 DIAGNOSIS — D61818 Other pancytopenia: Secondary | ICD-10-CM | POA: Insufficient documentation

## 2013-01-22 LAB — CBC
HCT: 31.1 % — ABNORMAL LOW (ref 39.0–52.0)
Hemoglobin: 10.7 g/dL — ABNORMAL LOW (ref 13.0–17.0)
MCH: 30.8 pg (ref 26.0–34.0)
MCHC: 34.4 g/dL (ref 30.0–36.0)
MCV: 89.6 fL (ref 78.0–100.0)
Platelets: 98 10*3/uL — ABNORMAL LOW (ref 150–400)
RBC: 3.47 MIL/uL — ABNORMAL LOW (ref 4.22–5.81)
WBC: 1.4 10*3/uL — CL (ref 4.0–10.5)

## 2013-01-22 LAB — PROTIME-INR: Prothrombin Time: 13.9 seconds (ref 11.6–15.2)

## 2013-01-22 LAB — BONE MARROW EXAM

## 2013-01-22 MED ORDER — SODIUM CHLORIDE 0.9 % IV SOLN
INTRAVENOUS | Status: DC
  Administered 2013-01-22: 07:00:00 via INTRAVENOUS

## 2013-01-22 MED ORDER — HEPARIN SOD (PORK) LOCK FLUSH 100 UNIT/ML IV SOLN
500.0000 [IU] | Freq: Once | INTRAVENOUS | Status: DC
  Filled 2013-01-22: qty 5

## 2013-01-22 MED ORDER — HYDROCODONE-ACETAMINOPHEN 5-325 MG PO TABS
1.0000 | ORAL_TABLET | ORAL | Status: DC | PRN
  Filled 2013-01-22: qty 2

## 2013-01-22 MED ORDER — MIDAZOLAM HCL 2 MG/2ML IJ SOLN
INTRAMUSCULAR | Status: AC
  Filled 2013-01-22: qty 4

## 2013-01-22 MED ORDER — FENTANYL CITRATE 0.05 MG/ML IJ SOLN
INTRAMUSCULAR | Status: AC | PRN
  Administered 2013-01-22: 100 ug via INTRAVENOUS

## 2013-01-22 MED ORDER — HEPARIN SOD (PORK) LOCK FLUSH 100 UNIT/ML IV SOLN
500.0000 [IU] | INTRAVENOUS | Status: DC
  Filled 2013-01-22: qty 5

## 2013-01-22 MED ORDER — HEPARIN SOD (PORK) LOCK FLUSH 100 UNIT/ML IV SOLN
500.0000 [IU] | INTRAVENOUS | Status: DC | PRN
  Administered 2013-01-22: 500 [IU]
  Filled 2013-01-22 (×2): qty 5

## 2013-01-22 MED ORDER — MIDAZOLAM HCL 2 MG/2ML IJ SOLN
INTRAMUSCULAR | Status: AC | PRN
  Administered 2013-01-22: 2 mg via INTRAVENOUS

## 2013-01-22 MED ORDER — FENTANYL CITRATE 0.05 MG/ML IJ SOLN
INTRAMUSCULAR | Status: AC
  Filled 2013-01-22: qty 4

## 2013-01-22 NOTE — H&P (Signed)
[name] is an 67 y.o. male.   Chief Complaint: "I'm having a bone biopsy" HPI: Patient with prior history of treated Hodgkin's disease/CLL and now with pancytopenia presents today for CT guided bone marrow biopsy.  Past Medical History  Diagnosis Date  . Aortic stenosis   . Atrial fibrillation   . Coronary atherosclerosis of native coronary artery   . Hyperlipidemia   . Essential hypertension, benign   . BPH (benign prostatic hypertrophy)   . Elevated WBC count   . Arthritis   . Acid reflux   . Hypogammaglobulinemia   . Pre-diabetes 08/04/2011  . CLL (chronic lymphoblastic leukemia)   . Hodgkin's disease   . Kidney stones   . Wears glasses   . Wears dentures   . HOH (hard of hearing)     Past Surgical History  Procedure Laterality Date  . Eye surgery    . 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 Esophagogastroduodenoscopy      scrapping of throat and stretching  . Lithotripsy    . Bone marrow aspiration    . Portacath placement    . 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 Colon surgery    . Dental surgery  08/2012  . Multiple tooth extractions    . Mass excision N/A 12/05/2012    Procedure: EXCISION MIDLINE NECK MASS;  Surgeon: Darletta Moll, MD;  Location: [facility];  Service: ENT;  Laterality: N/A;    Family History  Problem Relation Age of Onset  . Heart failure Mother   . Blindness Mother   . Cancer Father     Lung    Social History:  reports that he has never smoked. He has never used smokeless tobacco. He reports that he does not drink alcohol or use illicit drugs.  Allergies: No Known Allergies  Current outpatient prescriptions:acyclovir (ZOVIRAX) 200 MG capsule, Take 1 capsule (200 mg total) by mouth 2 (two) times daily., Disp: 60 capsule, Rfl: 2;  aspirin EC 81 MG tablet, Take 81 mg by mouth daily., Disp: , Rfl: ;  folic acid (FOLVITE) 1 MG tablet, Take 1 mg by mouth daily., Disp: , Rfl: ;  metoprolol  (LOPRESSOR) 50 MG tablet, Take 50 mg by mouth 2 (two) times daily., Disp: , Rfl:  Multiple Vitamins-Minerals (CENTRUM SILVER ULTRA MENS PO), Take 1 tablet by mouth every morning. , Disp: , Rfl: ;  OMEGA 3 1000 MG CAPS, Take 1,000 mg by mouth daily. , Disp: , Rfl: ;  omeprazole (PRILOSEC) 20 MG capsule, Take 20 mg by mouth every morning. , Disp: , Rfl: ;  rosuvastatin (CRESTOR) 20 MG tablet, Take 1 tablet (20 mg total) by mouth at bedtime., Disp: 30 tablet, Rfl: 11 sulfamethoxazole-trimethoprim (BACTRIM DS,SEPTRA DS) 800-160 MG per tablet, Take 1 tablet by mouth every Monday, Wednesday, and Friday., Disp: 60 tablet, Rfl: 3;  acetaminophen (TYLENOL) 500 MG tablet, Take 500 mg by mouth every 6 (six) hours as needed for pain., Disp: , Rfl:  Current facility-administered medications:0.9 %  sodium chloride infusion, , Intravenous, Continuous, D Jeananne Rama, PA-C, Last Rate: 20 mL/hr at 01/22/13 1610   Results for orders placed during the hospital encounter of 01/22/13 (from the past 48 hour(s))  APTT     Status: None   Collection Time    01/22/13  7:25 AM      Result Value Range   aPTT 30  24 - 37 seconds  CBC     Status: Abnormal   Collection Time    01/22/13  7:25 AM      Result Value Range   WBC 1.4 (*) 4.0 - 10.5 K/uL   Comment: REPEATED TO VERIFY     CRITICAL RESULT CALLED TO, READ BACK BY AND VERIFIED WITH:     L BENFIELD AT 0800 ON 10.27.2014 BY NBROOKS   RBC 3.47 (*) 4.22 - 5.81 MIL/uL   Hemoglobin 10.7 (*) 13.0 - 17.0 g/dL   HCT 16.1 (*) 09.6 - 04.5 %   MCV 89.6  78.0 - 100.0 fL   MCH 30.8  26.0 - 34.0 pg   MCHC 34.4  30.0 - 36.0 g/dL   RDW 40.9  81.1 - 91.4 %   Platelets 98 (*) 150 - 400 K/uL   Comment: SPECIMEN CHECKED FOR CLOTS     REPEATED TO VERIFY     PLATELET COUNT CONFIRMED BY SMEAR  PROTIME-INR     Status: None   Collection Time    01/22/13  7:25 AM      Result Value Range   Prothrombin Time 13.9  11.6 - 15.2 seconds   INR 1.09  0.00 - 1.49   No results  found.  Review of Systems  Constitutional: Negative for fever and chills.  HENT:       Occ HA's  Respiratory: Positive for cough. Negative for hemoptysis and shortness of breath.   Cardiovascular: Negative for chest pain.  Gastrointestinal: Negative for nausea, vomiting and abdominal pain.  Musculoskeletal: Negative for back pain.    Blood pressure 133/71, pulse 67, temperature 97.1 F (36.2 C), temperature source Oral, resp. rate 18, SpO2 100.00%. Physical Exam  Constitutional: He is oriented to person, place, and time. He appears well-developed and well-nourished.  HENT:  White plaque/lesion undersurface of tongue on left  Cardiovascular: Normal rate.   occ ectopy noted  Respiratory: Effort normal and breath sounds normal.  GI: Soft. Bowel sounds are normal.  Musculoskeletal: Normal range of motion. He exhibits no edema.  Neurological: He is alert and oriented to person, place, and time.     Assessment/Plan Pt with prior hx of treated  Hodgkin's disease/CLL; now with pancytopenia. Plan is for CT guided bone marrow biopsy today. Details/risks of procedure d/w pt /son with their understanding and consent.  Dyann Goodspeed,D KEVIN 01/22/2013, 8:17 AM

## 2013-01-22 NOTE — Procedures (Signed)
CT guided bone marrow aspirate and biopsy.  Two aspirates and three core biopsies performed.  No immediate complication.

## 2013-01-23 ENCOUNTER — Ambulatory Visit (INDEPENDENT_AMBULATORY_CARE_PROVIDER_SITE_OTHER): Payer: Medicare Other | Admitting: Adult Health

## 2013-01-23 ENCOUNTER — Encounter: Payer: Self-pay | Admitting: Adult Health

## 2013-01-23 VITALS — BP 107/66 | HR 74 | Ht 70.0 in | Wt 205.0 lb

## 2013-01-23 DIAGNOSIS — I10 Essential (primary) hypertension: Secondary | ICD-10-CM

## 2013-01-23 DIAGNOSIS — E785 Hyperlipidemia, unspecified: Secondary | ICD-10-CM

## 2013-01-23 DIAGNOSIS — I2510 Atherosclerotic heart disease of native coronary artery without angina pectoris: Secondary | ICD-10-CM

## 2013-01-23 NOTE — Assessment & Plan Note (Signed)
Blood pressure is well controlled. He is only on metoprolol 50 mg BID affecting BP. Continue to monitor.

## 2013-01-23 NOTE — Progress Notes (Signed)
HPI:  [name] is a 66 year old patient normally followed by Dr. Diona [name], we are seeing for ongoing management of CAD, atrial fibrillation, hypertension, with history of aortic valve disease replacement he is also seeing oncology for ongoing chemotherapy for CLL. He was last seen by Dr. Diona [name] in January of 2014, appeared to be stable, with no medication changes or testing planned. Followup lipids and LFTs were ordered as he been off Crestor for 3 months.   Lab report from 04/07/2012 demonstrated cholesterol 267; triglycerides of 260; HDL of 25; LDL of 190. He has stopped taking Crestor for one month.    He continues with chemotherapy and is followed by oncology at [facility].     No Known Allergies  Current Outpatient Prescriptions  Medication Sig Dispense Refill  . acetaminophen (TYLENOL) 500 MG tablet Take 500 mg by mouth every 6 (six) hours as needed for pain.      Marland Kitchen acyclovir (ZOVIRAX) 200 MG capsule Take 1 capsule (200 mg total) by mouth 2 (two) times daily.  60 capsule  2  . aspirin EC 81 MG tablet Take 81 mg by mouth daily.      . folic acid (FOLVITE) 1 MG tablet Take 1 mg by mouth daily.      . metoprolol (LOPRESSOR) 50 MG tablet Take 50 mg by mouth 2 (two) times daily.      . Multiple Vitamins-Minerals (CENTRUM SILVER ULTRA MENS PO) Take 1 tablet by mouth every morning.       Marland Kitchen OMEGA 3 1000 MG CAPS Take 1,000 mg by mouth daily.       Marland Kitchen omeprazole (PRILOSEC) 20 MG capsule Take 20 mg by mouth every morning.       . sulfamethoxazole-trimethoprim (BACTRIM DS,SEPTRA DS) 800-160 MG per tablet Take 1 tablet by mouth every Monday, Wednesday, and Friday.  60 tablet  3  . rosuvastatin (CRESTOR) 20 MG tablet Take 1 tablet (20 mg total) by mouth at bedtime.  30 tablet  11   No current facility-administered medications for this visit.    Past Medical History  Diagnosis Date  . Aortic stenosis   . Atrial fibrillation   . Coronary atherosclerosis of native coronary artery   .  Hyperlipidemia   . Essential hypertension, benign   . BPH (benign prostatic hypertrophy)   . Elevated WBC count   . Arthritis   . Acid reflux   . Hypogammaglobulinemia   . Pre-diabetes 08/04/2011  . CLL (chronic lymphoblastic leukemia)   . Hodgkin's disease   . Kidney stones   . Wears glasses   . Wears dentures   . HOH (hard of hearing)     Past Surgical History  Procedure Laterality Date  . Eye surgery    . 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 Esophagogastroduodenoscopy      scrapping of throat and stretching  . Lithotripsy    . Bone marrow aspiration    . Portacath placement    . 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 Colon surgery    . Dental surgery  08/2012  . Multiple tooth extractions    . Mass excision N/A 12/05/2012    Procedure: EXCISION MIDLINE NECK MASS;  Surgeon: Darletta Moll, MD;  Location:  [facility];  Service: ENT;  Laterality: N/A;    ZOX:WRUEAV of systems complete and found to be negative unless listed above  PHYSICAL EXAM BP  107/66  Pulse 74  Ht 5\' 10"  (1.778 m)  Wt 205 lb (92.987 kg)  BMI 29.41 kg/m2  General: Well developed, well nourished, in no acute distress. Significant body oder.  Head: Eyes PERRLA, No xanthomas.   Normal cephalic and atramatic  Lungs: Clear bilaterally to auscultation and percussion. Heart: HRRR S1 S2, 2.6 holosystolic murmur, with preserved S2. Best heard at the LSB.  Pulses are 2+ & equal.            No carotid bruit. No JVD.  No abdominal bruits. No femoral bruits. Abdomen: Bowel sounds are positive, abdomen soft and non-tender without masses or                  Hernia's noted. Msk:  Back normal, normal gait. Normal strength and tone for age. Extremities: No clubbing, cyanosis or edema.  DP +1 Neuro: Alert and oriented X 3. Psych:  Flat  affect, responds appropriately    ASSESSMENT AND PLAN

## 2013-01-23 NOTE — Assessment & Plan Note (Signed)
He offers no complaints of chest pain. DOE is chronic for him. He will continue risk management to include restarting of Crestor as directed.

## 2013-01-23 NOTE — Assessment & Plan Note (Signed)
Crestor is restarted. He is to follow up with Dr. Lodema Hong for ongoing labs,

## 2013-01-23 NOTE — Progress Notes (Deleted)
Name: [name]    DOB: 05/11/1945  Age: 67 y.o.  MR#: [id number]       PCP:  [name], MD      Insurance: Payor: Cleatrice Burke MEDICARE / Plan: AARP MEDICARE COMPLETE / Product Type: *No Product type* /   CC:    Chief Complaint  Patient presents with  . Atrial Fibrillation  . Coronary Artery Disease    VS Filed Vitals:   01/23/13 1331  BP: 107/66  Pulse: 74  Height: 5\' 10"  (1.778 m)  Weight: 205 lb (92.987 kg)    Weights Current Weight  01/23/13 205 lb (92.987 kg)  01/11/13 209 lb 1.6 oz (94.847 kg)  12/08/12 213 lb 3 oz (96.701 kg)    Blood Pressure  BP Readings from Last 3 Encounters:  01/23/13 107/66  01/22/13 96/50  01/11/13 113/72     Admit date:  (Not on file) Last encounter with RMR:  01/17/2013   Allergy Review of patient's allergies indicates no known allergies.  Current Outpatient Prescriptions  Medication Sig Dispense Refill  . acetaminophen (TYLENOL) 500 MG tablet Take 500 mg by mouth every 6 (six) hours as needed for pain.      Marland Kitchen acyclovir (ZOVIRAX) 200 MG capsule Take 1 capsule (200 mg total) by mouth 2 (two) times daily.  60 capsule  2  . aspirin EC 81 MG tablet Take 81 mg by mouth daily.      . folic acid (FOLVITE) 1 MG tablet Take 1 mg by mouth daily.      . metoprolol (LOPRESSOR) 50 MG tablet Take 50 mg by mouth 2 (two) times daily.      . Multiple Vitamins-Minerals (CENTRUM SILVER ULTRA MENS PO) Take 1 tablet by mouth every morning.       Marland Kitchen OMEGA 3 1000 MG CAPS Take 1,000 mg by mouth daily.       Marland Kitchen omeprazole (PRILOSEC) 20 MG capsule Take 20 mg by mouth every morning.       . sulfamethoxazole-trimethoprim (BACTRIM DS,SEPTRA DS) 800-160 MG per tablet Take 1 tablet by mouth every Monday, Wednesday, and Friday.  60 tablet  3  . rosuvastatin (CRESTOR) 20 MG tablet Take 1 tablet (20 mg total) by mouth at bedtime.  30 tablet  11   No current facility-administered medications for this visit.    Discontinued Meds:   There are no  discontinued medications.  Patient Active Problem List   Diagnosis Date Noted  . Cholelithiasis 01/11/2013  . Neck mass 11/02/2012  . Routine general medical examination at a health care facility 10/29/2012  . Periorbital cellulitis of right eye 10/23/2012  . Reduced vision 10/23/2012  . Piles (hemorrhoids) 06/19/2012  . Dental infection 05/12/2012  . Pre-diabetes 08/04/2011  . Tachyarrhythmia 08/03/2011  . Thrombocytopenia 08/02/2011  . 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 Anemia 08/02/2011  . GERD (gastroesophageal reflux disease) 08/23/2010  . Essential hypertension, benign 05/06/2010  . OBESITY 02/10/2010  . AORTIC VALVE REPLACEMENT, HX OF 04/23/2009  . CORONARY ATHEROSCLEROSIS NATIVE CORONARY ARTERY 02/26/2008  . Atrial fibrillation 02/26/2008  . AORTIC STENOSIS 11/12/2007  . OTHER DYSPHAGIA 11/07/2007  . HODGKIN'S DISEASE 10/02/2007  . CLL 10/02/2007  . HYPERLIPIDEMIA 04/07/2007    LABS    Component Value Date/Time   NA 140 12/08/2012 1418   NA 141 12/05/2012 0833   NA 139 11/06/2012 1241   K 4.1 12/08/2012 1418   K 3.7 12/05/2012 0833   K 4.3 11/06/2012 1241   CL 103 12/08/2012  1418   CL 104 12/05/2012 0833   CL 102 11/06/2012 1241   CO2 29 12/08/2012 1418   CO2 27 11/06/2012 1241   CO2 26 09/21/2012 1118   GLUCOSE 106* 12/08/2012 1418   GLUCOSE 97 12/05/2012 0833   GLUCOSE 111* 11/06/2012 1241   BUN 9 12/08/2012 1418   BUN 7 12/05/2012 0833   BUN 13 11/06/2012 1241   CREATININE 1.22 12/08/2012 1418   CREATININE 1.10 12/05/2012 0833   CREATININE 1.12 11/06/2012 1241   CREATININE 1.29 05/10/2011 1541   CREATININE 0.99 06/03/2010 0933   CALCIUM 9.3 12/08/2012 1418   CALCIUM 8.8 11/06/2012 1241   CALCIUM 8.7 09/21/2012 1118   GFRNONAA 59* 12/08/2012 1418   GFRNONAA 66* 11/06/2012 1241   GFRNONAA 56* 09/21/2012 1118   GFRAA 69* 12/08/2012 1418   GFRAA 76* 11/06/2012 1241   GFRAA 65* 09/21/2012 1118   CMP     Component Value Date/Time   NA 140 12/08/2012 1418   K 4.1 12/08/2012  1418   CL 103 12/08/2012 1418   CO2 29 12/08/2012 1418   GLUCOSE 106* 12/08/2012 1418   BUN 9 12/08/2012 1418   CREATININE 1.22 12/08/2012 1418   CREATININE 1.29 05/10/2011 1541   CALCIUM 9.3 12/08/2012 1418   PROT 5.8* 12/08/2012 1418   ALBUMIN 3.5 12/08/2012 1418   AST 34 12/08/2012 1418   ALT 27 12/08/2012 1418   ALKPHOS 114 12/08/2012 1418   BILITOT 0.4 12/08/2012 1418   GFRNONAA 59* 12/08/2012 1418   GFRAA 69* 12/08/2012 1418       Component Value Date/Time   WBC 1.4* 01/22/2013 0725   WBC 1.3* 01/11/2013 1419   WBC 1.1* 12/08/2012 1418   HGB 10.7* 01/22/2013 0725   HGB 11.0* 01/11/2013 1419   HGB 10.4* 12/08/2012 1418   HCT 31.1* 01/22/2013 0725   HCT 32.5* 01/11/2013 1419   HCT 31.6* 12/08/2012 1418   MCV 89.6 01/22/2013 0725   MCV 92.3 01/11/2013 1419   MCV 96.9 12/08/2012 1418    Lipid Panel     Component Value Date/Time   CHOL 267* 04/07/2012 0835   TRIG 260* 04/07/2012 0835   HDL 25* 04/07/2012 0835   CHOLHDL 10.7 04/07/2012 0835   VLDL 52* 04/07/2012 0835   LDLCALC 190* 04/07/2012 0835    ABG    Component Value Date/Time   PHART 7.456* 01/05/2008 1758   PCO2ART 35.9 01/05/2008 1758   PO2ART 92.0 01/05/2008 1758   HCO3 25.3* 01/05/2008 1758   TCO2 26 12/05/2012 0833   ACIDBASEDEF 2.0 01/05/2008 1423   O2SAT 98.0 01/05/2008 1758     Lab Results  Component Value Date   TSH 2.358 08/30/2012   BNP (last 3 results) No results found for this basename: PROBNP,  in the last 8760 hours Cardiac Panel (last 3 results) No results found for this basename: CKTOTAL, CKMB, TROPONINI, RELINDX,  in the last 72 hours  Iron/TIBC/Ferritin No results found for this basename: iron, tibc, ferritin     EKG Orders placed during the hospital encounter of 12/05/12  . EKG 12-LEAD  . EKG 12-LEAD  . EKG 12-LEAD  . EKG 12-LEAD  . EKG     Prior Assessment and Plan Problem List as of 01/23/2013     Cardiovascular and Mediastinum   CORONARY ATHEROSCLEROSIS NATIVE CORONARY ARTERY   Last  Assessment & Plan   04/03/2012 Office Visit Written 04/03/2012  1:37 PM by Jonelle Sidle, MD     No active angina symptoms.  AORTIC STENOSIS   Atrial fibrillation   Last Assessment & Plan   04/03/2012 Office Visit Written 04/03/2012  1:37 PM by Jonelle Sidle, MD     He is in sinus rhythm on examination today. Continue aspirin for now with medical complexity and ongoing chemotherapy for CLL.    Essential hypertension, benign   Last Assessment & Plan   10/23/2012 Office Visit Written 10/29/2012  3:24 PM by Kerri Perches, MD     Controlled, no change in medication     Tachyarrhythmia   Piles (hemorrhoids)   Last Assessment & Plan   06/19/2012 Office Visit Written 06/26/2012  9:05 PM by Kerri Perches, MD     Acute flare. Pt ed re need fo soft stool, adequate water intake. Topial prep to promote shrinkage Pt advised to avoid straining and pushing      Digestive   OTHER DYSPHAGIA   GERD (gastroesophageal reflux disease)   Last Assessment & Plan   05/04/2012 Office Visit Written 05/12/2012 10:37 AM by Kerri Perches, MD     Controlled, no change in medication     Dental infection   Last Assessment & Plan   05/04/2012 Office Visit Written 05/12/2012 10:40 AM by Kerri Perches, MD     Acute innfection with swelling of right lower jaw, currently on antibiotics needs dental care asap    Cholelithiasis     Endocrine   Pre-diabetes   Last Assessment & Plan   11/02/2012 Office Visit Written 11/05/2012  2:47 PM by Kerri Perches, MD     Updated lab needed. Patient educated about the importance of limiting  Carbohydrate intake , the need to commit to daily physical activity for a minimum of 30 minutes , and to commit weight loss. The fact that changes in all these areas will reduce or eliminate all together the development of diabetes is stressed.         Hematopoietic and Hemostatic   Thrombocytopenia     Other   HODGKIN'S DISEASE   CLL   Last Assessment & Plan    05/19/2011 Office Visit Written 05/19/2011 11:15 AM by Kerri Perches, MD     Recent hospitalization with marked elevation of WBC, has appt wioth onc this week, rept cbc and diff tooday    HYPERLIPIDEMIA   Last Assessment & Plan   11/02/2012 Office Visit Written 11/05/2012  2:45 PM by Kerri Perches, MD     Uncontrolled, now compliant with meds. Updated lab next visit Hyperlipidemia:Low fat diet discussed and encouraged.      OBESITY   Last Assessment & Plan   11/02/2012 Office Visit Written 11/05/2012  2:46 PM by Kerri Perches, MD     Improved. Pt applauded on succesful weight loss through lifestyle change, and encouraged to continue same. Weight loss goal set for the next several months.     AORTIC VALVE REPLACEMENT, HX OF   Last Assessment & Plan   04/03/2012 Office Visit Written 04/03/2012  1:36 PM by Jonelle Sidle, MD     Stable exam, status post pericardial AVR. Continue observation.    Hypogammaglobulinemia, acquired   Anemia   Periorbital cellulitis of right eye   Last Assessment & Plan   11/02/2012 Office Visit Written 11/05/2012  2:43 PM by Kerri Perches, MD     Resolved following oral antibiotic course    Reduced vision   Last Assessment & Plan   10/23/2012 Office Visit Written 10/29/2012  3:23 PM by Kerri Perches, MD     Urgent referral to opthalmology, pt left office and went directly to eye specialist    Routine general medical examination at a health care facility   Last Assessment & Plan   10/05/2012 Office Visit Written 10/29/2012  9:23 PM by Kerri Perches, MD     Annual wellness completed as documented. Pt is limited in mobility die to arthritis as well as fatigue. He is limited in ability to take care of his finances and relies on his spouse who unfortunately is now dealing with recurrent cancer as well Mental health is as good as can be expected, he is relying on his faith to cope with health challenges End of life issues discussed and he is a  full code    Neck mass   Last Assessment & Plan   11/02/2012 Office Visit Written 11/05/2012  2:43 PM by Kerri Perches, MD     Palpable nodule under neck, will order [place] and refer for ENT to follow        Imaging: Ct Biopsy  01/22/2013   CLINICAL DATA:  67 year-old with history of treated Hodgkin's disease and CLL. The patient now has pancytopenia.  EXAM: CT GUIDED BONE MARROW ASPIRATES AND BIOPSY  Physician: Rachelle Hora. Henn, MD  MEDICATIONS: Versed 2 mg, fentanyl 100 mcg. A radiology nurse monitored the patient for moderate sedation.  ANESTHESIA/SEDATION: Sedation time: 15 min  PROCEDURE: The procedure was explained to the patient. The risks and benefits of the procedure were discussed and the patient's questions were addressed. Informed consent was obtained from the patient. The patient was placed prone on CT scan. Images of the pelvis were obtained. The right side of back was prepped and draped in sterile fashion. The skin and right posterior iliac bone were anesthetized with 1% lidocaine. 11 gauge bone needle was directed into the right iliac bone with CT guidance. Two aspirates and 3 core biopsies obtained.  COMPLICATIONS: None  FINDINGS: Bone needle directed into the posterior right iliac bone.  IMPRESSION: CT guided bone marrow aspirates and core biopsy.   Electronically Signed   By: Richarda Overlie M.D.   On: 01/22/2013 11:08

## 2013-01-23 NOTE — Patient Instructions (Signed)
Your physician recommends that you schedule a follow-up appointment in: 6 months with Dr McDowell You will receive a reminder letter two months in advance reminding you to call and schedule your appointment. If you don't receive this letter, please contact our office.  Your physician recommends that you continue on your current medications as directed. Please refer to the Current Medication list given to you today.   

## 2013-01-25 ENCOUNTER — Other Ambulatory Visit (HOSPITAL_COMMUNITY)
Admission: RE | Admit: 2013-01-25 | Discharge: 2013-01-25 | Disposition: A | Discharge status: Home / Self Care | Payer: Medicare Other | Source: Ambulatory Visit | Attending: Otolaryngology | Admitting: Otolaryngology

## 2013-01-25 ENCOUNTER — Ambulatory Visit (INDEPENDENT_AMBULATORY_CARE_PROVIDER_SITE_OTHER): Payer: Medicare Other | Admitting: Otolaryngology

## 2013-01-25 ENCOUNTER — Encounter (INDEPENDENT_AMBULATORY_CARE_PROVIDER_SITE_OTHER): Payer: Self-pay

## 2013-01-25 DIAGNOSIS — D3701 Neoplasm of uncertain behavior of lip: Secondary | ICD-10-CM

## 2013-01-25 DIAGNOSIS — D61818 Other pancytopenia: Secondary | ICD-10-CM | POA: Insufficient documentation

## 2013-01-31 LAB — CHROMOSOME ANALYSIS, BONE MARROW

## 2013-02-05 ENCOUNTER — Ambulatory Visit: Payer: Medicare Other | Admitting: Family Medicine

## 2013-02-05 ENCOUNTER — Encounter (HOSPITAL_COMMUNITY): Payer: Self-pay

## 2013-02-06 ENCOUNTER — Other Ambulatory Visit (HOSPITAL_COMMUNITY): Payer: Self-pay | Admitting: Oncology

## 2013-02-06 ENCOUNTER — Telehealth (HOSPITAL_COMMUNITY): Payer: Self-pay

## 2013-02-06 DIAGNOSIS — D801 Nonfamilial hypogammaglobulinemia: Secondary | ICD-10-CM

## 2013-02-06 DIAGNOSIS — C8190 Hodgkin lymphoma, unspecified, unspecified site: Secondary | ICD-10-CM

## 2013-02-06 DIAGNOSIS — C9110 Chronic lymphocytic leukemia of B-cell type not having achieved remission: Secondary | ICD-10-CM

## 2013-02-06 MED ORDER — ACYCLOVIR 200 MG PO CAPS: 200.0000 mg | ORAL_CAPSULE | Freq: Two times a day (BID) | ORAL | Status: DC

## 2013-02-06 MED ORDER — FOLIC ACID 1 MG PO TABS: 1.0000 mg | ORAL_TABLET | Freq: Every day | ORAL | Status: DC

## 2013-02-06 NOTE — Telephone Encounter (Signed)
Message copied by Evelena Leyden on Tue Feb 06, 2013 12:47 PM ------      Message from: Evelena Leyden      Created: Thu Jan 25, 2013  8:23 AM       Check on chromosone analysis ------

## 2013-02-08 ENCOUNTER — Encounter (HOSPITAL_COMMUNITY): Payer: Medicare Other | Attending: Oncology

## 2013-02-08 ENCOUNTER — Ambulatory Visit (INDEPENDENT_AMBULATORY_CARE_PROVIDER_SITE_OTHER): Payer: Medicare Other | Admitting: Otolaryngology

## 2013-02-08 ENCOUNTER — Encounter (HOSPITAL_COMMUNITY): Payer: Self-pay

## 2013-02-08 ENCOUNTER — Encounter (INDEPENDENT_AMBULATORY_CARE_PROVIDER_SITE_OTHER): Payer: Self-pay

## 2013-02-08 VITALS — BP 84/49 | HR 72 | Temp 97.5°F | Resp 16 | Wt 205.8 lb

## 2013-02-08 DIAGNOSIS — C9110 Chronic lymphocytic leukemia of B-cell type not having achieved remission: Secondary | ICD-10-CM | POA: Insufficient documentation

## 2013-02-08 DIAGNOSIS — D61818 Other pancytopenia: Secondary | ICD-10-CM | POA: Insufficient documentation

## 2013-02-08 DIAGNOSIS — K121 Other forms of stomatitis: Secondary | ICD-10-CM

## 2013-02-08 DIAGNOSIS — D839 Common variable immunodeficiency, unspecified: Secondary | ICD-10-CM | POA: Insufficient documentation

## 2013-02-08 LAB — COMPREHENSIVE METABOLIC PANEL
ALT: 20 U/L (ref 0–53)
AST: 26 U/L (ref 0–37)
Albumin: 3.7 g/dL (ref 3.5–5.2)
Alkaline Phosphatase: 110 U/L (ref 39–117)
BUN: 11 mg/dL (ref 6–23)
CO2: 29 mEq/L (ref 19–32)
Calcium: 9.1 mg/dL (ref 8.4–10.5)
GFR calc Af Amer: 71 mL/min — ABNORMAL LOW (ref >90)
GFR calc non Af Amer: 62 mL/min — ABNORMAL LOW (ref >90)
Glucose, Bld: 99 mg/dL (ref 70–99)
Sodium: 145 mEq/L (ref 135–145)
Total Protein: 6.1 g/dL (ref 6.0–8.3)

## 2013-02-08 LAB — CBC WITH DIFFERENTIAL/PLATELET
Basophils Absolute: 0 10*3/uL (ref 0.0–0.1)
HCT: 32.9 % — ABNORMAL LOW (ref 39.0–52.0)
Lymphs Abs: 0.7 10*3/uL (ref 0.7–4.0)
MCH: 30.9 pg (ref 26.0–34.0)
MCHC: 33.1 g/dL (ref 30.0–36.0)
MCV: 93.2 fL (ref 78.0–100.0)
Monocytes Absolute: 0.4 10*3/uL (ref 0.1–1.0)
Neutro Abs: 0.2 10*3/uL — ABNORMAL LOW (ref 1.7–7.7)
Platelets: 113 10*3/uL — ABNORMAL LOW (ref 150–400)
RDW: 16.3 % — ABNORMAL HIGH (ref 11.5–15.5)

## 2013-02-08 LAB — LACTATE DEHYDROGENASE: LDH: 280 U/L — ABNORMAL HIGH (ref 94–250)

## 2013-02-08 LAB — URIC ACID: Uric Acid, Serum: 7 mg/dL (ref 4.0–7.8)

## 2013-02-08 NOTE — Progress Notes (Signed)
[facility]  OFFICE PROGRESS NOTE  [name], MD [address], [address] [place] [address]  DIAGNOSIS: CLL (chronic lymphocytic leukemia) - Plan: CBC with Differential, Comprehensive metabolic panel, Lactate dehydrogenase, Beta 2 microglobuline, serum, IgG, IgA, IgM, CBC with Differential  Chief Complaint: To go over the bone marrow biopsy report  CURRENT THERAPY: Watchful expectation having discontinued Rituxan, acyclovir, and Bactrim.  INTERVAL HISTORY: [name] 67 y.o. male with history of CLL diagnosed in  2004, S/p Chemotherapy, with recurrence in  2013, s/p 6 cycles of Bendamustine and Rituxan followed by maintenance Rituxan.  Rituxan was discontinued and he is on watchful expectation at present. He was also diagnosed with Hodgkin's disease and treated in 2006. In view of persistent pancytopenia he underwent bone marrow aspiration and biopsy on 01/22/2013 that revealed Hypercellular bone marrow for age and several lymphoid aggregates are present. Flowcytometry failed to show any significant B cell population. IHC's positive for mainly T cells and minimal B-cell population focal coexpression CD5 stain and negative for cyclin D1 suggestive of minimal involvement of the CLL.Marland Kitchen Cytogenetics were normal. He also had the biopsy of tongue lesion by Dr. Suszanne Conners 01/26/2013 and that revealed granulation tissue negative for malignancy  He denies any headaches, dizziness, double vision, fevers, chills, night sweats, nausea, vomiting, diarrhea, constipation, chest pain, heart palpitations, shortness of breath, blood in stool, black tarry stool, urinary pain, urinary burning, urinary frequency, hematuria.  MEDICAL HISTORY: Past Medical History  Diagnosis Date  . Aortic stenosis   . Atrial fibrillation   . Coronary atherosclerosis of native coronary artery   . Hyperlipidemia   . Essential hypertension, benign   . BPH (benign prostatic  hypertrophy)   . Elevated WBC count   . Arthritis   . Acid reflux   . Hypogammaglobulinemia   . Pre-diabetes 08/04/2011  . CLL (chronic lymphoblastic leukemia)   . Hodgkin's disease   . Kidney stones   . Wears glasses   . Wears dentures   . HOH (hard of hearing)     INTERIM HISTORY: has HODGKIN'S DISEASE; CLL; HYPERLIPIDEMIA; OBESITY; CORONARY ATHEROSCLEROSIS NATIVE CORONARY ARTERY; AORTIC STENOSIS; Atrial fibrillation; OTHER DYSPHAGIA; AORTIC VALVE REPLACEMENT, HX OF; Essential hypertension, benign; GERD (gastroesophageal reflux disease); Thrombocytopenia; Hypogammaglobulinemia, acquired; Anemia; Tachyarrhythmia; Pre-diabetes; Dental infection; Piles (hemorrhoids); Periorbital cellulitis of right eye; Reduced vision; Routine general medical examination at a health care facility; Neck mass; and Cholelithiasis on his problem list.    ALLERGIES:  has No Known Allergies.  MEDICATIONS:  Current Outpatient Prescriptions  Medication Sig Dispense Refill  . acetaminophen (TYLENOL) 500 MG tablet Take 500 mg by mouth every 6 (six) hours as needed for pain.      Marland Kitchen acyclovir (ZOVIRAX) 200 MG capsule Take 1 capsule (200 mg total) by mouth 2 (two) times daily.  60 capsule  2  . aspirin EC 81 MG tablet Take 81 mg by mouth daily.      . folic acid (FOLVITE) 1 MG tablet Take 1 tablet (1 mg total) by mouth daily.  30 tablet  3  . metoprolol (LOPRESSOR) 50 MG tablet Take 50 mg by mouth 2 (two) times daily.      . Multiple Vitamins-Minerals (CENTRUM SILVER ULTRA MENS PO) Take 1 tablet by mouth every morning.       Marland Kitchen OMEGA 3 1000 MG CAPS Take 1,000 mg by mouth daily.       Marland Kitchen omeprazole (PRILOSEC) 20 MG capsule Take 20 mg  by mouth every morning.       . rosuvastatin (CRESTOR) 20 MG tablet Take 1 tablet (20 mg total) by mouth at bedtime.  30 tablet  11  . sulfamethoxazole-trimethoprim (BACTRIM DS,SEPTRA DS) 800-160 MG per tablet Take 1 tablet by mouth every Monday, Wednesday, and Friday.  60 tablet  3  .  HYDROcodone-acetaminophen (NORCO/VICODIN) 5-325 MG per tablet        No current facility-administered medications for this visit.    SURGICAL HISTORY:  Past Surgical History  Procedure Laterality Date  . Eye surgery    . 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 Esophagogastroduodenoscopy      scrapping of throat and stretching  . Lithotripsy    . Bone marrow aspiration    . Portacath placement    . 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 Colon surgery    . Dental surgery  08/2012  . Multiple tooth extractions    . Mass excision N/A 12/05/2012    Procedure: EXCISION MIDLINE NECK MASS;  Surgeon: Darletta Moll, MD;  Location: [facility];  Service: ENT;  Laterality: N/A;  . Tongue biopsy  12/2012    FAMILY HISTORY: family history includes Blindness in his mother; Cancer in his father; Heart failure in his mother.  SOCIAL HISTORY:  reports that he has never smoked. He has never used smokeless tobacco. He reports that he does not drink alcohol or use illicit drugs.  REVIEW OF SYSTEMS:  As mentioned in the history of present illness  PHYSICAL EXAMINATION: ECOG PERFORMANCE STATUS: 1 - Symptomatic but completely ambulatory  Blood pressure 84/49, pulse 72, temperature 97.5 F (36.4 C), temperature source Oral, resp. rate 16, weight 205 lb 12.8 oz (93.35 kg).  GENERAL:alert, no distress, well nourished and well developed SKIN: no rashes or significant lesions HEAD: Normocephalic EYES: PERRLA, EOMI, Conjunctiva are pink and non-injected, sclera clear EARS: External ears normal OROPHARYNX:no erythema, lips, buccal mucosa, and tongue normal and mucous membranes are moist  NECK: supple, no adenopathy, no JVD, no stridor, non-tender LYMPH:  no palpable lymphadenopathy, no hepatosplenomegaly BREAST:breasts appear normal, no suspicious masses, no skin or nipple changes or axillary nodes LUNGS: clear to auscultation , coarse sounds heard HEART:  regular rate & rhythm ABDOMEN:abdomen soft, obese and normal bowel sounds BACK: Back symmetric, no curvature. EXTREMITIES:no edema, no clubbing and no cyanosis  NEURO: alert & oriented x 3 with fluent speech, no focal motor/sensory deficits, gait normal   LABORATORY DATA: Hospital Outpatient Visit on 01/22/2013  Component Date Value Range Status  . aPTT 01/22/2013 30  24 - 37 seconds Final  . WBC 01/22/2013 1.4* 4.0 - 10.5 K/uL Final   Comment: REPEATED TO VERIFY                          CRITICAL RESULT CALLED TO, READ BACK BY AND VERIFIED WITH:                          L BENFIELD AT 0800 ON 10.27.2014 BY NBROOKS  . RBC 01/22/2013 3.47* 4.22 - 5.81 MIL/uL Final  . Hemoglobin 01/22/2013 10.7* 13.0 - 17.0 g/dL Final  . HCT 16/12/9602 31.1* 39.0 - 52.0 % Final  . MCV 01/22/2013 89.6  78.0 - 100.0 fL Final  . MCH 01/22/2013 30.8  26.0 - 34.0 pg Final  .  MCHC 01/22/2013 34.4  30.0 - 36.0 g/dL Final  . RDW 16/12/9602 15.1  11.5 - 15.5 % Final  . Platelets 01/22/2013 98* 150 - 400 K/uL Final   Comment: SPECIMEN CHECKED FOR CLOTS                          REPEATED TO VERIFY                          PLATELET COUNT CONFIRMED BY SMEAR  . Prothrombin Time 01/22/2013 13.9  11.6 - 15.2 seconds Final  . INR 01/22/2013 1.09  0.00 - 1.49 Final  . Bone Marrow Exam 01/22/2013 SEE PATHOLOGY REPORT [id number]   Corrected   CORRECTED ON 10/27 AT 1145: PREVIOUSLY REPORTED AS SEE PATHOLOGY REPORT [id number]  . Chromosome-Routine 01/22/2013 SEE SEPARATE REPORT   Final   Performed at [facility]  Office Visit on 01/11/2013  Component Date Value Range Status  . WBC 01/11/2013 1.3* 4.0 - 10.5 K/uL Final   Comment: CRITICAL RESULT CALLED TO, READ BACK BY AND VERIFIED WITH:                          ROBERTSON,T. AT 1527 ON 01/11/2013 BY BAUGHAM,M.  . RBC 01/11/2013 3.52* 4.22 - 5.81 MIL/uL Final  . Hemoglobin 01/11/2013 11.0* 13.0 - 17.0 g/dL Final  . HCT 54/11/8117 32.5* 39.0 - 52.0 % Final  . MCV  01/11/2013 92.3  78.0 - 100.0 fL Final  . MCH 01/11/2013 31.3  26.0 - 34.0 pg Final  . MCHC 01/11/2013 33.8  30.0 - 36.0 g/dL Final  . RDW 14/78/2956 15.2  11.5 - 15.5 % Final  . Platelets 01/11/2013 109* 150 - 400 K/uL Final  . Neutrophils Relative % 01/11/2013 14* 43 - 77 % Final  . Lymphocytes Relative 01/11/2013 38  12 - 46 % Final  . Monocytes Relative 01/11/2013 21* 3 - 12 % Final  . Eosinophils Relative 01/11/2013 26* 0 - 5 % Final  . Basophils Relative 01/11/2013 1  0 - 1 % Final  . Neutro Abs 01/11/2013 0.2* 1.7 - 7.7 K/uL Final  . Lymphs Abs 01/11/2013 0.5* 0.7 - 4.0 K/uL Final  . Monocytes Absolute 01/11/2013 0.3  0.1 - 1.0 K/uL Final  . Eosinophils Absolute 01/11/2013 0.3  0.0 - 0.7 K/uL Final  . Basophils Absolute 01/11/2013 0.0  0.0 - 0.1 K/uL Final  . WBC Morphology 01/11/2013 Differential confirmed by smear.   Final  . Smear Review 01/11/2013 PLATELET COUNT CONFIRMED BY SMEAR   Final   PLATELETS APPEAR DECREASED    Urinalysis    Component Value Date/Time   COLORURINE YELLOW 02/28/2012 0116   APPEARANCEUR CLEAR 02/28/2012 0116   LABSPEC 1.020 02/28/2012 0116   PHURINE 5.5 02/28/2012 0116   GLUCOSEU NEGATIVE 02/28/2012 0116   HGBUR NEGATIVE 02/28/2012 0116   BILIRUBINUR NEGATIVE 02/28/2012 0116   KETONESUR NEGATIVE 02/28/2012 0116   PROTEINUR NEGATIVE 02/28/2012 0116   UROBILINOGEN 0.2 02/28/2012 0116   NITRITE NEGATIVE 02/28/2012 0116   LEUKOCYTESUR NEGATIVE 02/28/2012 0116    RADIOGRAPHIC STUDIES: Ct Biopsy  01/22/2013   CLINICAL DATA:  67 year-old with history of treated Hodgkin's disease and CLL. The patient now has pancytopenia.  EXAM: CT GUIDED BONE MARROW ASPIRATES AND BIOPSY  Physician: Rachelle Hora. Henn, MD  MEDICATIONS: Versed 2 mg, fentanyl 100 mcg. A radiology nurse monitored the patient  for moderate sedation.  ANESTHESIA/SEDATION: Sedation time: 15 min  PROCEDURE: The procedure was explained to the patient. The risks and benefits of the procedure were discussed and  the patient's questions were addressed. Informed consent was obtained from the patient. The patient was placed prone on CT scan. Images of the pelvis were obtained. The right side of back was prepped and draped in sterile fashion. The skin and right posterior iliac bone were anesthetized with 1% lidocaine. 11 gauge bone needle was directed into the right iliac bone with CT guidance. Two aspirates and 3 core biopsies obtained.  COMPLICATIONS: None  FINDINGS: Bone needle directed into the posterior right iliac bone.  IMPRESSION: CT guided bone marrow aspirates and core biopsy.   Electronically Signed   By: Richarda Overlie M.D.   On: 01/22/2013 11:08    ASSESSMENT/PLAN: #1. Pancytopenia -Differential secondary to chronic lymphocytic leukemia involvement of  bone marrow/versus progression of splenomegaly and also with hypocellular bone marrow underlying MDS cannot be excluded. Will repeat ultrasound of the abdomen to assess for progression of splenomegaly. Further plan of care to follow after review of ultrasound and repeat lab work  #2.  left anterior tongue lesion: Tongue lesion biopsy negative for malignancy #3. Chronic lymphocytic leukemia with involvement of bone marrow on repeat bone marrow aspiration and biopsy: Will order the IgG, IgA levels to assess for need of IVIG in view of recent infection. After review of ultrasound of the abdomen, We can consider   novel anti-CD20 monoclonal antibody (obinutuzumab or ofatumumab)/Rituxan. CD 20 on flow was tested and will check with path for confirmation.  #4. Hodgkin's disease, nodular sclerosis type, no evidence of disease: F/u with beta 2 microglobulin LDH and CBC which was ordered  #5. Followup in 4 weeks.    ll questions were answered. The patient knows to call the clinic with any problems, questions or concerns. We can certainly see the patient much sooner if necessary.   I spent 20 minutes counseling the patient face to face. The total time spent in the  appointment was   Annamarie Dawley, MD 02/08/2013 1:59 PM

## 2013-02-08 NOTE — Patient Instructions (Signed)
[facility] Discharge Instructions  RECOMMENDATIONS MADE BY THE CONSULTANT AND ANY TEST RESULTS WILL BE SENT TO YOUR REFERRING PHYSICIAN.  EXAM FINDINGS BY THE PHYSICIAN TODAY AND SIGNS OR SYMPTOMS TO REPORT TO CLINIC OR PRIMARY PHYSICIAN: Exam and findings as discussed by Dr. Lajuana Ripple.  Will check labs today to see how your immune system is working.  MEDICATIONS PRESCRIBED:  none  INSTRUCTIONS/FOLLOW-UP: Follow-up in 1 month.  Thank you for choosing [facility] to provide your oncology and hematology care.  To afford each patient quality time with our providers, please arrive at least 15 minutes before your scheduled appointment time.  With your help, our goal is to use those 15 minutes to complete the necessary work-up to ensure our physicians have the information they need to help with your evaluation and healthcare recommendations.    Effective January 1st, 2014, we ask that you re-schedule your appointment with our physicians should you arrive 10 or more minutes late for your appointment.  We strive to give you quality time with our providers, and arriving late affects you and other patients whose appointments are after yours.    Again, thank you for choosing [facility]  Our hope is that these requests will decrease the amount of time that you wait before being seen by our physicians.       _____________________________________________________________  Should you have questions after your visit to [facility], please contact our office at [phone] between the hours of 8:30 a.m. and 5:00 p.m.  Voicemails left after 4:30 p.m. will not be returned until the following business day.  For prescription refill requests, have your pharmacy contact our office with your prescription refill request.

## 2013-02-08 NOTE — Progress Notes (Signed)
[name] presented for labwork. Labs per MD order drawn via Peripheral Line 23 gauge needle inserted in right AC  Good blood return present. Procedure without incident.  Needle removed intact. Patient tolerated procedure well.

## 2013-02-09 ENCOUNTER — Other Ambulatory Visit (HOSPITAL_COMMUNITY): Payer: Self-pay | Admitting: Oncology

## 2013-02-09 ENCOUNTER — Telehealth: Payer: Self-pay

## 2013-02-09 ENCOUNTER — Encounter (HOSPITAL_COMMUNITY): Payer: Self-pay | Admitting: *Deleted

## 2013-02-09 ENCOUNTER — Other Ambulatory Visit (HOSPITAL_COMMUNITY): Payer: Self-pay | Admitting: Hematology and Oncology

## 2013-02-09 ENCOUNTER — Other Ambulatory Visit: Payer: Self-pay | Admitting: Family Medicine

## 2013-02-09 DIAGNOSIS — D801 Nonfamilial hypogammaglobulinemia: Secondary | ICD-10-CM

## 2013-02-09 LAB — IGG, IGA, IGM
IgA: 9 mg/dL — ABNORMAL LOW (ref 68–379)
IgG (Immunoglobin G), Serum: 108 mg/dL — ABNORMAL LOW (ref 650–1600)

## 2013-02-09 LAB — BETA 2 MICROGLOBULIN, SERUM: Beta-2 Microglobulin: 4.37 mg/L — ABNORMAL HIGH (ref 1.01–1.73)

## 2013-02-09 MED ORDER — AZITHROMYCIN 250 MG PO TABS: ORAL_TABLET | ORAL | Status: AC

## 2013-02-09 NOTE — Telephone Encounter (Signed)
pls let wife know z pack has been sent to [facility], I hope that this helps him

## 2013-02-09 NOTE — Telephone Encounter (Signed)
Called and left message notifying patient.

## 2013-02-12 ENCOUNTER — Ambulatory Visit (INDEPENDENT_AMBULATORY_CARE_PROVIDER_SITE_OTHER): Payer: Medicare Other | Admitting: Family Medicine

## 2013-02-12 ENCOUNTER — Encounter: Payer: Self-pay | Admitting: Family Medicine

## 2013-02-12 VITALS — BP 110/60 | HR 72 | Resp 16 | Ht 70.0 in | Wt 207.8 lb

## 2013-02-12 DIAGNOSIS — E785 Hyperlipidemia, unspecified: Secondary | ICD-10-CM

## 2013-02-12 DIAGNOSIS — R7309 Other abnormal glucose: Secondary | ICD-10-CM

## 2013-02-12 DIAGNOSIS — I4891 Unspecified atrial fibrillation: Secondary | ICD-10-CM

## 2013-02-12 DIAGNOSIS — I10 Essential (primary) hypertension: Secondary | ICD-10-CM

## 2013-02-12 DIAGNOSIS — R7303 Prediabetes: Secondary | ICD-10-CM

## 2013-02-12 DIAGNOSIS — I2510 Atherosclerotic heart disease of native coronary artery without angina pectoris: Secondary | ICD-10-CM

## 2013-02-12 NOTE — Patient Instructions (Signed)
Annual wellness in 4 month, call if you need me before  We will call you to collect additional crestor 20mg  which you take 4 days per week, once we get them  If able we are adding cholesterol to labs from last week. If unable you need fasting lipid panel in the next 1 week please. Nurse will let you know  Check date of abdominal US when you next go to oncology clinic this week.  I am thankful that biopsy of tongue was negative for cancer.  Complete azithromycin prescribed, already you feel better which is good

## 2013-02-12 NOTE — Progress Notes (Signed)
  Subjective:    Patient ID: [name], male    DOB: 1945/11/01, 67 y.o.   MRN: [id number]  HPI The PT is here for follow up and re-evaluation of chronic medical conditions, medication management and review of any available recent lab and radiology data.  Preventive health is updated, specifically  Cancer screening and Immunization.   Questions or concerns regarding consultations or procedures which the PT has had in the interim are  Addressed.Recently had biopsy of a lesion on his tongue which was benign The PT denies any adverse reactions to current medications since the last visit.  Is currently on day 3 of antibiotic he had called in for  Due toi chest congestion , cough and chills and states he is feeling much better       Review of Systems See HPI Denies recent fever or chills. Denies sinus pressure, nasal congestion, ear pain or sore throat.  Denies chest pains, palpitations and leg swelling Denies abdominal pain, nausea, vomiting,diarrhea or constipation.   Denies dysuria, frequency, hesitancy or incontinence. Chronic  joint pain,and limitation in mobility. Denies headaches, seizures, numbness, or tingling. Denies depression, anxiety or insomnia. Denies skin break down or rash.        Objective:   Physical Exam  Patient alert and oriented and in no cardiopulmonary distress.  HEENT: No facial asymmetry, EOMI, no sinus tenderness,  oropharynx pink and moist.  Neck supple no adenopathy.  Chest: Clear to auscultation bilaterally.Few crackles, no wheezes  CVS: S1, S2 systolic  murmur, no S3.  ABD: Soft non tender. Bowel sounds normal.  Ext: No edema  MS: Adequate ROM spine, shoulders, hips and knees.  Skin: Intact, no ulcerations or rash noted.  Psych: Good eye contact, normal affect. Memory mildly impaired not anxious or depressed appearing.  CNS: CN 2-12 intact, power, tone and sensation normal throughout.       Assessment & Plan:

## 2013-02-14 ENCOUNTER — Encounter (HOSPITAL_BASED_OUTPATIENT_CLINIC_OR_DEPARTMENT_OTHER): Payer: Medicare Other

## 2013-02-14 ENCOUNTER — Telehealth: Payer: Self-pay | Admitting: Cardiology

## 2013-02-14 ENCOUNTER — Telehealth: Payer: Self-pay | Admitting: Family Medicine

## 2013-02-14 VITALS — BP 129/55 | HR 65 | Temp 96.6°F | Resp 18

## 2013-02-14 DIAGNOSIS — R7989 Other specified abnormal findings of blood chemistry: Secondary | ICD-10-CM

## 2013-02-14 DIAGNOSIS — D801 Nonfamilial hypogammaglobulinemia: Secondary | ICD-10-CM

## 2013-02-14 DIAGNOSIS — E785 Hyperlipidemia, unspecified: Secondary | ICD-10-CM

## 2013-02-14 DIAGNOSIS — C9110 Chronic lymphocytic leukemia of B-cell type not having achieved remission: Secondary | ICD-10-CM

## 2013-02-14 DIAGNOSIS — D61818 Other pancytopenia: Secondary | ICD-10-CM

## 2013-02-14 LAB — CBC WITH DIFFERENTIAL/PLATELET
Basophils Relative: 1 % (ref 0–1)
Eosinophils Absolute: 0.3 10*3/uL (ref 0.0–0.7)
Eosinophils Relative: 21 % — ABNORMAL HIGH (ref 0–5)
HCT: 30.8 % — ABNORMAL LOW (ref 39.0–52.0)
Hemoglobin: 10.4 g/dL — ABNORMAL LOW (ref 13.0–17.0)
Lymphs Abs: 0.5 10*3/uL — ABNORMAL LOW (ref 0.7–4.0)
MCH: 31.2 pg (ref 26.0–34.0)
MCHC: 33.8 g/dL (ref 30.0–36.0)
MCV: 92.5 fL (ref 78.0–100.0)
Monocytes Absolute: 0.3 10*3/uL (ref 0.1–1.0)
Monocytes Relative: 18 % — ABNORMAL HIGH (ref 3–12)
Neutro Abs: 0.4 10*3/uL — ABNORMAL LOW (ref 1.7–7.7)

## 2013-02-14 MED ORDER — HEPARIN SOD (PORK) LOCK FLUSH 100 UNIT/ML IV SOLN
INTRAVENOUS | Status: AC
  Filled 2013-02-14: qty 5

## 2013-02-14 MED ORDER — IMMUNE GLOBULIN (HUMAN) 10 GM/200ML IV SOLN
10.0000 g | Freq: Once | INTRAVENOUS | Status: AC
  Administered 2013-02-14: 10 g via INTRAVENOUS
  Filled 2013-02-14: qty 200

## 2013-02-14 MED ORDER — SODIUM CHLORIDE 0.9 % IJ SOLN
10.0000 mL | INTRAMUSCULAR | Status: DC | PRN
  Administered 2013-02-14: 10 mL

## 2013-02-14 MED ORDER — ACETAMINOPHEN 325 MG PO TABS
ORAL_TABLET | ORAL | Status: AC
  Filled 2013-02-14: qty 2

## 2013-02-14 MED ORDER — IMMUNE GLOBULIN (HUMAN) 10 GM/100ML IV SOLN: 0.4000 g/kg | Freq: Once | INTRAVENOUS | Status: DC

## 2013-02-14 MED ORDER — METOPROLOL TARTRATE 50 MG PO TABS: 50.0000 mg | ORAL_TABLET | Freq: Two times a day (BID) | ORAL | Status: DC

## 2013-02-14 MED ORDER — IMMUNE GLOBULIN (HUMAN) 5 GM/100ML IV SOLN
25.0000 g | Freq: Once | INTRAVENOUS | Status: DC
  Filled 2013-02-14: qty 100

## 2013-02-14 MED ORDER — ACETAMINOPHEN 325 MG PO TABS
650.0000 mg | ORAL_TABLET | Freq: Four times a day (QID) | ORAL | Status: DC | PRN
  Administered 2013-02-14: 650 mg via ORAL

## 2013-02-14 MED ORDER — DIPHENHYDRAMINE HCL 25 MG PO CAPS
ORAL_CAPSULE | ORAL | Status: AC
  Filled 2013-02-14: qty 1

## 2013-02-14 MED ORDER — SODIUM CHLORIDE 0.9 % IV SOLN
Freq: Once | INTRAVENOUS | Status: AC
  Administered 2013-02-14: 09:00:00 via INTRAVENOUS

## 2013-02-14 MED ORDER — DIPHENHYDRAMINE HCL 25 MG PO CAPS
25.0000 mg | ORAL_CAPSULE | Freq: Once | ORAL | Status: AC
  Administered 2013-02-14: 25 mg via ORAL
  Filled 2013-02-14: qty 1

## 2013-02-14 MED ORDER — HEPARIN SOD (PORK) LOCK FLUSH 100 UNIT/ML IV SOLN
500.0000 [IU] | Freq: Once | INTRAVENOUS | Status: AC | PRN
  Administered 2013-02-14: 500 [IU]

## 2013-02-14 NOTE — Progress Notes (Signed)
Had IVIG today per protocol.  Tolerated infusion well.

## 2013-02-14 NOTE — Telephone Encounter (Signed)
Please send Metoprolol to [facility] RDS/tgs

## 2013-02-14 NOTE — Telephone Encounter (Signed)
Pls add a free T3 and free T4 level to lab on this pt drawn at cancer center, a message was sent to me that the TSH is elevated. If unable to add, he will need a re stick, but I do not expect this to be the case as lab was just drawn

## 2013-02-14 NOTE — Telephone Encounter (Signed)
Medication sent via escribe.  

## 2013-02-15 LAB — T4, FREE: Free T4: 1.08 ng/dL (ref 0.80–1.80)

## 2013-02-15 LAB — LIPID PANEL
LDL Cholesterol: 149 mg/dL — ABNORMAL HIGH (ref 0–99)
Total CHOL/HDL Ratio: 8.2 Ratio
VLDL: 30 mg/dL (ref 0–40)

## 2013-02-15 LAB — T3, FREE: T3, Free: 3.3 pg/mL (ref 2.3–4.2)

## 2013-02-15 NOTE — Telephone Encounter (Signed)
Order for labs faxed to [facility] lab.  T3 free, T4 free, and Lipid

## 2013-02-15 NOTE — Addendum Note (Signed)
Addended by: Kandis Fantasia B on: 02/15/2013 09:56 AM   Modules accepted: Orders

## 2013-02-18 NOTE — Assessment & Plan Note (Signed)
Controlled, no change in medication DASH diet and commitment to daily physical activity for a minimum of 30 minutes discussed and encouraged, as a part of hypertension management. The importance of attaining a healthy weight is also discussed.  

## 2013-02-18 NOTE — Assessment & Plan Note (Signed)
Deteriorated Hyperlipidemia:Low fat diet discussed and encouraged.  Med compliance an isssue, access to medication is limited, and despite attempts to use generic medication , not followed through

## 2013-02-18 NOTE — Assessment & Plan Note (Signed)
Patient educated about the importance of limiting  Carbohydrate intake , the need to commit to daily physical activity for a minimum of 30 minutes , and to commit weight loss. The fact that changes in all these areas will reduce or eliminate all together the development of diabetes is stressed.   Updated lab needed 

## 2013-02-18 NOTE — Assessment & Plan Note (Signed)
Denies any acute episode of chest pain or increased exertional fatigue, currently stable

## 2013-02-18 NOTE — Assessment & Plan Note (Signed)
Rate controlled with medication

## 2013-02-22 ENCOUNTER — Encounter (HOSPITAL_COMMUNITY): Payer: Medicare Other

## 2013-03-08 ENCOUNTER — Ambulatory Visit (HOSPITAL_COMMUNITY): Payer: Medicare Other

## 2013-03-09 ENCOUNTER — Telehealth (HOSPITAL_COMMUNITY): Payer: Self-pay

## 2013-03-09 NOTE — Telephone Encounter (Signed)
Message left for patient to call to reschedule missed office visit.

## 2013-03-11 NOTE — Progress Notes (Signed)
Rescheduled

## 2013-03-12 ENCOUNTER — Ambulatory Visit (HOSPITAL_COMMUNITY): Payer: Medicare Other | Admitting: Oncology

## 2013-03-27 ENCOUNTER — Encounter (HOSPITAL_COMMUNITY): Payer: Medicare Other

## 2013-03-28 NOTE — Progress Notes (Signed)
[name] Overman, MD [address] [place] [address]  CLL - Plan: CBC with Differential, Comprehensive metabolic panel, Lactate dehydrogenase, Sedimentation rate, Beta 2 microglobuline, serum, C-reactive protein, CBC with Differential, Comprehensive metabolic panel, Lactate dehydrogenase, Sedimentation rate, Beta 2 microglobuline, serum, C-reactive protein, sodium chloride 0.9 % injection 10 mL, heparin lock flush 100 unit/mL, sodium chloride 0.9 % injection 10 mL, CBC with Differential, Comprehensive metabolic panel, Lactate dehydrogenase, Sedimentation rate  HODGKIN'S DISEASE - Plan: CBC with Differential, Comprehensive metabolic panel, Lactate dehydrogenase, Sedimentation rate, Beta 2 microglobuline, serum, C-reactive protein, CBC with Differential, Comprehensive metabolic panel, Lactate dehydrogenase, Sedimentation rate, Beta 2 microglobuline, serum, C-reactive protein, CBC with Differential, Comprehensive metabolic panel, Lactate dehydrogenase, Sedimentation rate  Laceration - Plan: cephALEXin (KEFLEX) 500 MG capsule  CURRENT THERAPY: Surveillance  INTERVAL HISTORY: [name] 67 y.o. male returns for  regular  visit for followup of history of CLL diagnosed in 2004, S/p Chemotherapy, with recurrence in 2013, s/p 6 cycles of Bendamustine and Rituxan followed by maintenance Rituxan. Rituxan was discontinued and he is on watchful expectation at present. He was also diagnosed with Hodgkin's disease and treated in 2006. In view of persistent pancytopenia he underwent bone marrow aspiration and biopsy on 01/22/2013 that revealed  Hypercellular bone marrow for age and several lymphoid aggregates are present. Flowcytometry failed to show any significant B cell population. IHC's positive for mainly T cells and minimal B-cell population focal coexpression CD5 stain and negative for cyclin D1 suggestive of minimal involvement of the CLL.[employer] were normal. He also had the biopsy  of tongue lesion by Dr. Suszanne [name] 01/26/2013 and that revealed granulation tissue negative for malignancy   [name]'s wife, [name], recently passed away from metastatic endometrial cancer.  He is of course emotional regarding that, but he seems to be doing well, all things considered.   He is very faithful and I encouraged his spirituality.  He denies any hematologic complaints, but he notes a right lower abdominal "puffiness."  He reports that he noted it 5 days ago.  He denies any pain, worsening of puffiness with movement or cough or bowel movement.  He denies any fevers or chills.   Otherwise, he denies any complaints and hematologic ROS questioning is negative. He denies any B symptoms.   The majority of our visit was a discussion regarding his wife and her recent passing.   Past Medical History  Diagnosis Date  . Aortic stenosis   . Atrial fibrillation   . Coronary atherosclerosis of native coronary artery   . Hyperlipidemia   . Essential hypertension, benign   . BPH (benign prostatic hypertrophy)   . Elevated WBC count   . Arthritis   . Acid reflux   . Hypogammaglobulinemia   . Pre-diabetes 08/04/2011  . CLL (chronic lymphoblastic leukemia)   . Hodgkin's disease   . Kidney stones   . Wears glasses   . Wears dentures   . HOH (hard of hearing)     has HODGKIN'S DISEASE; CLL; HYPERLIPIDEMIA; OBESITY; CORONARY ATHEROSCLEROSIS NATIVE CORONARY ARTERY; AORTIC STENOSIS; Atrial fibrillation; OTHER DYSPHAGIA; AORTIC VALVE REPLACEMENT, HX OF; Essential hypertension, benign; GERD (gastroesophageal reflux disease); Thrombocytopenia; Hypogammaglobulinemia, acquired; Anemia; Tachyarrhythmia; Pre-diabetes; Dental infection; Piles (hemorrhoids); Periorbital cellulitis of right eye; Reduced vision; Routine general medical examination at a health care facility; Neck mass; and Cholelithiasis on his problem list.     has No Known Allergies.  [name] does not currently have medications on file.  Past  Surgical History  Procedure Laterality Date  . Eye surgery    . 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 Esophagogastroduodenoscopy      scrapping of throat and stretching  . Lithotripsy    . Bone marrow aspiration    . Portacath placement    . 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 Colon surgery    . Dental surgery  08/2012  . Multiple tooth extractions    . Mass excision N/A 12/05/2012    Procedure: EXCISION MIDLINE NECK MASS;  Surgeon: Darletta Moll, MD;  Location: [facility];  Service: ENT;  Laterality: N/A;  . Tongue biopsy  12/2012    Denies any headaches, dizziness, double vision, fevers, chills, night sweats, nausea, vomiting, diarrhea, constipation, chest pain, heart palpitations, shortness of breath, blood in stool, black tarry stool, urinary pain, urinary burning, urinary frequency, hematuria.   PHYSICAL EXAMINATION  ECOG PERFORMANCE STATUS: 1 - Symptomatic but completely ambulatory  Filed Vitals:   03/30/13 1400  BP: 131/75  Pulse: 75  Temp: 97.8 F (36.6 C)  Resp: 20    GENERAL:alert, no distress, comfortable, cooperative, obese, smiling and tearful at times when we were discussing his deceased wife who was a patient of ours and feline urine odor SKIN: skin color, texture, turgor are normal, no rashes or significant lesions HEAD: Normocephalic, No masses, lesions, tenderness or abnormalities EYES: normal, PERRLA, EOMI, Conjunctiva are pink and non-injected EARS: External ears normal OROPHARYNX:mucous membranes are moist and poor dentition  NECK: supple, trachea midline LYMPH:  not examined BREAST:not examined LUNGS: not examined HEART: not examined ABDOMEN:abdomen soft, non-tender, obese, normal bowel sounds and no masses or organomegaly BACK: Back symmetric, no curvature. EXTREMITIES:less then 2 second capillary refill, no skin discoloration, no clubbing, no cyanosis  NEURO: alert & oriented x 3 with fluent  speech, no focal motor/sensory deficits, gait normal PELVIC:right sided, pubic/mons pubis edema measuring ~ 5 cm without any clear demarcated mass, nontender to palpation, not reducible, no change with valsalva.  Mass is not hard to palpation. Right groin open laceration.    LABORATORY DATA: CBC    Component Value Date/Time   WBC 1.5* 02/14/2013 0849   RBC 3.33* 02/14/2013 0849   HGB 10.4* 02/14/2013 0849   HCT 30.8* 02/14/2013 0849   PLT 99* 02/14/2013 0849   MCV 92.5 02/14/2013 0849   MCH 31.2 02/14/2013 0849   MCHC 33.8 02/14/2013 0849   RDW 16.5* 02/14/2013 0849   LYMPHSABS 0.5* 02/14/2013 0849   MONOABS 0.3 02/14/2013 0849   EOSABS 0.3 02/14/2013 0849   BASOSABS 0.0 02/14/2013 0849      Chemistry      Component Value Date/Time   NA 145 02/08/2013 1420   K 3.5 02/08/2013 1420   CL 105 02/08/2013 1420   CO2 29 02/08/2013 1420   BUN 11 02/08/2013 1420   CREATININE 1.18 02/08/2013 1420   CREATININE 1.29 05/10/2011 1541      Component Value Date/Time   CALCIUM 9.1 02/08/2013 1420   ALKPHOS 110 02/08/2013 1420   AST 26 02/08/2013 1420   ALT 20 02/08/2013 1420   BILITOT 0.5 02/08/2013 1420     Results for JAYTHEN, HAMME (MRN [id number]) as of 03/11/2013 09:58  Ref. Range 02/08/2013 14:20  Beta-2 Microglobulin Latest Range: 1.01-1.73 mg/L 4.37 (H)  Results for IRENE, MITCHAM (MRN [id number]) as of 03/11/2013 09:58  Ref. Range 02/08/2013 14:20  IgG (Immunoglobin  G), Serum Latest Range: 605 377 9988 mg/dL 191 (L)  IgA Latest Range: 68-379 mg/dL 9 (L)  IgM, Serum Latest Range: 41-251 mg/dL <4 (L)      ASSESSMENT:  1. Pancytopenia, differential secondary to chronic lymphocytic leukemia involvement of bone marrow/versus progression of splenomegaly and also with hypocellular bone marrow underlying MDS cannot be excluded.  2. Left anterior tongue lesion: Tongue lesion biopsy negative for malignancy 3. Chronic lymphocytic leukemia with minimal residual involvement on  repeat bone marrow aspiration and biopsy.  Not in need of therapy at this time, but in the future we may consider novel anti-CD20 monoclonal antibody (obinutuzumab or ofatumumab)/Rituxan.  5. Hodgkin's disease, nodular sclerosis type, no evidence of disease 6. Right inguinal laceration 7. Right pubic/mons pubis mass edema without a clearly demarcated mass.  No size increase with valsalva.  Patient Active Problem List   Diagnosis Date Noted  . Cholelithiasis 01/11/2013  . Neck mass 11/02/2012  . Routine general medical examination at a health care facility 10/29/2012  . Periorbital cellulitis of right eye 10/23/2012  . Reduced vision 10/23/2012  . Piles (hemorrhoids) 06/19/2012  . Dental infection 05/12/2012  . Pre-diabetes 08/04/2011  . Tachyarrhythmia 08/03/2011  . Thrombocytopenia 08/02/2011  . 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 Anemia 08/02/2011  . GERD (gastroesophageal reflux disease) 08/23/2010  . Essential hypertension, benign 05/06/2010  . OBESITY 02/10/2010  . AORTIC VALVE REPLACEMENT, HX OF 04/23/2009  . CORONARY ATHEROSCLEROSIS NATIVE CORONARY ARTERY 02/26/2008  . Atrial fibrillation 02/26/2008  . AORTIC STENOSIS 11/12/2007  . OTHER DYSPHAGIA 11/07/2007  . HODGKIN'S DISEASE 10/02/2007  . CLL 10/02/2007  . HYPERLIPIDEMIA 04/07/2007     PLAN:  1. I personally reviewed and went over laboratory results with the patient. 2. I personally reviewed and went over pathology results with the patient. 3. Recommend OTC Polysporin, triple antibiotic ointment, etc 4. Keflex 500 mg QID x 10 days 5. Report to ED with any increased pain or increased swelling of right pubic/mons pubis mass 6. Labs today: CBC diff, CMET, ESR, CRP, LDH, B2M 7. Port flush today 8. Labs in 6 weeks: CBC diff, CMET, ESR, LDH 9. Return in 2 weeks for follow-up on pubic enlargement on right. 10. Patient educated to report to ED for increased size and/or pain. 11. Follow-up in 2 months for  regular appt.    THERAPY PLAN:  From a hematology standpoint, he seems to be stable clinically.  We will get labs today.  I will need to follow-up on his right pubic mass and I will treat with antibiotics in the event that the mass is a reactive lymph node due to right inguinal laceration.  All questions were answered. The patient knows to call the clinic with any problems, questions or concerns. We can certainly see the patient much sooner if necessary.  Patient and plan discussed with Dr. Alla German and he is in agreement with the aforementioned.    Kasiah Manka

## 2013-03-30 ENCOUNTER — Encounter (HOSPITAL_COMMUNITY): Payer: Medicare Other

## 2013-03-30 ENCOUNTER — Encounter (HOSPITAL_COMMUNITY): Payer: Medicare Other | Attending: Oncology | Admitting: Oncology

## 2013-03-30 ENCOUNTER — Encounter (HOSPITAL_COMMUNITY): Payer: Self-pay | Admitting: Oncology

## 2013-03-30 VITALS — BP 131/75 | HR 75 | Temp 97.8°F | Resp 20 | Wt 210.6 lb

## 2013-03-30 DIAGNOSIS — Z8571 Personal history of Hodgkin lymphoma: Secondary | ICD-10-CM

## 2013-03-30 DIAGNOSIS — Z87898 Personal history of other specified conditions: Secondary | ICD-10-CM

## 2013-03-30 DIAGNOSIS — C9110 Chronic lymphocytic leukemia of B-cell type not having achieved remission: Secondary | ICD-10-CM

## 2013-03-30 DIAGNOSIS — D61818 Other pancytopenia: Secondary | ICD-10-CM

## 2013-03-30 DIAGNOSIS — T148XXA Other injury of unspecified body region, initial encounter: Secondary | ICD-10-CM | POA: Insufficient documentation

## 2013-03-30 DIAGNOSIS — C8190 Hodgkin lymphoma, unspecified, unspecified site: Secondary | ICD-10-CM | POA: Insufficient documentation

## 2013-03-30 DIAGNOSIS — IMO0002 Reserved for concepts with insufficient information to code with codable children: Secondary | ICD-10-CM

## 2013-03-30 LAB — CBC WITH DIFFERENTIAL/PLATELET
BASOS ABS: 0 10*3/uL (ref 0.0–0.1)
BASOS PCT: 1 % (ref 0–1)
EOS PCT: 20 % — AB (ref 0–5)
Eosinophils Absolute: 0.4 10*3/uL (ref 0.0–0.7)
HEMATOCRIT: 34.6 % — AB (ref 39.0–52.0)
Hemoglobin: 11.6 g/dL — ABNORMAL LOW (ref 13.0–17.0)
LYMPHS PCT: 41 % (ref 12–46)
Lymphs Abs: 0.7 10*3/uL (ref 0.7–4.0)
MCH: 31 pg (ref 26.0–34.0)
MCHC: 33.5 g/dL (ref 30.0–36.0)
MCV: 92.5 fL (ref 78.0–100.0)
MONO ABS: 0.4 10*3/uL (ref 0.1–1.0)
Monocytes Relative: 23 % — ABNORMAL HIGH (ref 3–12)
Neutro Abs: 0.3 10*3/uL — ABNORMAL LOW (ref 1.7–7.7)
Neutrophils Relative %: 15 % — ABNORMAL LOW (ref 43–77)
PLATELETS: 103 10*3/uL — AB (ref 150–400)
RBC: 3.74 MIL/uL — ABNORMAL LOW (ref 4.22–5.81)
RDW: 16.1 % — AB (ref 11.5–15.5)
WBC: 1.8 10*3/uL — AB (ref 4.0–10.5)

## 2013-03-30 LAB — COMPREHENSIVE METABOLIC PANEL
ALBUMIN: 3.6 g/dL (ref 3.5–5.2)
ALT: 27 U/L (ref 0–53)
AST: 30 U/L (ref 0–37)
Alkaline Phosphatase: 113 U/L (ref 39–117)
BUN: 15 mg/dL (ref 6–23)
CALCIUM: 9.3 mg/dL (ref 8.4–10.5)
CO2: 26 meq/L (ref 19–32)
CREATININE: 1.36 mg/dL — AB (ref 0.50–1.35)
Chloride: 104 mEq/L (ref 96–112)
GFR calc Af Amer: 60 mL/min — ABNORMAL LOW (ref >90)
GFR calc non Af Amer: 52 mL/min — ABNORMAL LOW (ref >90)
Glucose, Bld: 96 mg/dL (ref 70–99)
Potassium: 4.4 mEq/L (ref 3.7–5.3)
SODIUM: 140 meq/L (ref 137–147)
Total Bilirubin: 0.4 mg/dL (ref 0.3–1.2)
Total Protein: 6.4 g/dL (ref 6.0–8.3)

## 2013-03-30 LAB — SEDIMENTATION RATE: Sed Rate: 31 mm/hr — ABNORMAL HIGH (ref 0–16)

## 2013-03-30 LAB — LACTATE DEHYDROGENASE: LDH: 233 U/L (ref 94–250)

## 2013-03-30 MED ORDER — HEPARIN SOD (PORK) LOCK FLUSH 100 UNIT/ML IV SOLN
INTRAVENOUS | Status: AC
  Filled 2013-03-30: qty 5

## 2013-03-30 MED ORDER — HEPARIN SOD (PORK) LOCK FLUSH 100 UNIT/ML IV SOLN
500.0000 [IU] | Freq: Once | INTRAVENOUS | Status: AC
  Administered 2013-03-30: 500 [IU] via INTRAVENOUS

## 2013-03-30 MED ORDER — SODIUM CHLORIDE 0.9 % IJ SOLN
10.0000 mL | INTRAMUSCULAR | Status: DC | PRN
  Administered 2013-03-30: 10 mL via INTRAVENOUS

## 2013-03-30 MED ORDER — CEPHALEXIN 500 MG PO CAPS: 500.0000 mg | ORAL_CAPSULE | Freq: Four times a day (QID) | ORAL | Status: DC

## 2013-03-30 NOTE — Patient Instructions (Signed)
[place] Discharge Instructions  RECOMMENDATIONS MADE BY THE CONSULTANT AND ANY TEST RESULTS WILL BE SENT TO YOUR REFERRING PHYSICIAN.  Lab work and port flush today. Port flush every 6 weeks. Regular return appointment with MD in 2 months. Follow up in 2 weeks to re-check area right lower abdomen. A prescription for Keflex (antibiotic) was sent to your pharmacy. Please pick this up ASAP and take as directed. Use Neosporin ointment to cut area at groin.   Thank you for choosing [facility] to provide your oncology and hematology care.  To afford each patient quality time with our providers, please arrive at least 15 minutes before your scheduled appointment time.  With your help, our goal is to use those 15 minutes to complete the necessary work-up to ensure our physicians have the information they need to help with your evaluation and healthcare recommendations.    Effective January 1st, 2014, we ask that you re-schedule your appointment with our physicians should you arrive 10 or more minutes late for your appointment.  We strive to give you quality time with our providers, and arriving late affects you and other patients whose appointments are after yours.    Again, thank you for choosing [facility].  Our hope is that these requests will decrease the amount of time that you wait before being seen by our physicians.       _____________________________________________________________  Should you have questions after your visit to [facility], please contact our office at [phone] between the hours of 8:30 a.m. and 5:00 p.m.  Voicemails left after 4:30 p.m. will not be returned until the following business day.  For prescription refill requests, have your pharmacy contact our office with your prescription refill request.

## 2013-03-30 NOTE — Progress Notes (Signed)
[name] presented for Portacath access and flush. Proper placement of portacath confirmed by CXR. Portacath located right chest wall accessed with  H 20 needle. Good blood return present. Portacath flushed with 20ml NS and 500U/5ml Heparin and needle removed intact. Procedure without incident. Patient tolerated procedure well.   

## 2013-03-31 LAB — C-REACTIVE PROTEIN

## 2013-04-02 LAB — BETA 2 MICROGLOBULIN, SERUM: Beta-2 Microglobulin: 4.49 mg/L — ABNORMAL HIGH (ref 1.01–1.73)

## 2013-04-13 ENCOUNTER — Encounter (HOSPITAL_COMMUNITY): Payer: Self-pay | Admitting: Oncology

## 2013-04-13 ENCOUNTER — Encounter (HOSPITAL_BASED_OUTPATIENT_CLINIC_OR_DEPARTMENT_OTHER): Payer: Medicare Other | Admitting: Oncology

## 2013-04-13 VITALS — BP 109/67 | HR 76 | Temp 97.8°F | Resp 18 | Wt 212.4 lb

## 2013-04-13 DIAGNOSIS — K4090 Unilateral inguinal hernia, without obstruction or gangrene, not specified as recurrent: Secondary | ICD-10-CM | POA: Insufficient documentation

## 2013-04-13 HISTORY — DX: Unilateral inguinal hernia, without obstruction or gangrene, not specified as recurrent: K40.90

## 2013-04-13 NOTE — Patient Instructions (Signed)
[place] Discharge Instructions  RECOMMENDATIONS MADE BY THE CONSULTANT AND ANY TEST RESULTS WILL BE SENT TO YOUR REFERRING PHYSICIAN.  MEDICATIONS PRESCRIBED:  None  INSTRUCTIONS GIVEN AND DISCUSSED: Wear a hernia belt Consider follow-up with Dr. Romona Curls if hernia bothers you.  SPECIAL INSTRUCTIONS/FOLLOW-UP: Return as scheduled.   Thank you for choosing [place] to provide your oncology and hematology care.  To afford each patient quality time with our providers, please arrive at least 15 minutes before your scheduled appointment time.  With your help, our goal is to use those 15 minutes to complete the necessary work-up to ensure our physicians have the information they need to help with your evaluation and healthcare recommendations.    Effective January 1st, 2014, we ask that you re-schedule your appointment with our physicians should you arrive 10 or more minutes late for your appointment.  We strive to give you quality time with our providers, and arriving late affects you and other patients whose appointments are after yours.    Again, thank you for choosing [facility].  Our hope is that these requests will decrease the amount of time that you wait before being seen by our physicians.       _____________________________________________________________  Should you have questions after your visit to [facility], please contact our office at [phone] between the hours of 8:30 a.m. and 5:00 p.m.  Voicemails left after 4:30 p.m. will not be returned until the following business day.  For prescription refill requests, have your pharmacy contact our office with your prescription refill request.

## 2013-04-13 NOTE — Progress Notes (Signed)
[name] is seen in a short-interval follow-up appointment to follow-up on a right sided supra-pubic swelling.  [name] reports that he has completed 3 days worth of the antibiotic I have prescribed him due to a communication error between himself and the [employer].    On exam today, a defect is noted and [name] has an indirect hernia.  I do not feel any bowel, but I suspect omentum is palpated in the hernial sac.  Hernia reduces in size when laying flat.  Assessment: 1. Indirect hernia  Plan: 1. Wear a hernia belt 2. He may want to follow-up with Dr. Romona Curls for consideration of surgical intervention if desired. 3. If severe pain is noted, he is to report to the ED for possible bowel incarceration. 4. Follow-up as scheduled.   Patient and plan discussed with Dr. Farrel Gobble and he is in agreement with the aforementioned.   Patient also examined by Dr. Barnet Glasgow.  KEFALAS,THOMAS

## 2013-04-24 ENCOUNTER — Encounter: Payer: Self-pay | Admitting: Family Medicine

## 2013-04-24 ENCOUNTER — Ambulatory Visit (INDEPENDENT_AMBULATORY_CARE_PROVIDER_SITE_OTHER): Payer: Medicare Other | Admitting: Family Medicine

## 2013-04-24 VITALS — BP 118/74 | HR 73 | Resp 15 | Ht 70.0 in | Wt 205.0 lb

## 2013-04-24 DIAGNOSIS — E785 Hyperlipidemia, unspecified: Secondary | ICD-10-CM

## 2013-04-24 DIAGNOSIS — F4321 Adjustment disorder with depressed mood: Secondary | ICD-10-CM

## 2013-04-24 DIAGNOSIS — K047 Periapical abscess without sinus: Secondary | ICD-10-CM

## 2013-04-24 DIAGNOSIS — I10 Essential (primary) hypertension: Secondary | ICD-10-CM

## 2013-04-24 DIAGNOSIS — R4589 Other symptoms and signs involving emotional state: Secondary | ICD-10-CM

## 2013-04-24 DIAGNOSIS — C9110 Chronic lymphocytic leukemia of B-cell type not having achieved remission: Secondary | ICD-10-CM

## 2013-04-24 DIAGNOSIS — K044 Acute apical periodontitis of pulpal origin: Secondary | ICD-10-CM

## 2013-04-24 DIAGNOSIS — K4090 Unilateral inguinal hernia, without obstruction or gangrene, not specified as recurrent: Secondary | ICD-10-CM

## 2013-04-24 DIAGNOSIS — B370 Candidal stomatitis: Secondary | ICD-10-CM

## 2013-04-24 DIAGNOSIS — C8190 Hodgkin lymphoma, unspecified, unspecified site: Secondary | ICD-10-CM

## 2013-04-24 DIAGNOSIS — E669 Obesity, unspecified: Secondary | ICD-10-CM

## 2013-04-24 MED ORDER — NYSTATIN 100000 UNIT/ML MT SUSP: OROMUCOSAL | Status: DC

## 2013-04-24 NOTE — Patient Instructions (Signed)
F/u in 4 month, call if you need me before  Nystatin sent in for thrush   Fasting lipid and cmp in 4 month, before visit  You are referred to Dr Romona Curls re hernias  Please try and take care of teeth and vision as soon as possible

## 2013-05-02 ENCOUNTER — Telehealth: Payer: Self-pay | Admitting: Family Medicine

## 2013-05-02 DIAGNOSIS — C8590 Non-Hodgkin lymphoma, unspecified, unspecified site: Secondary | ICD-10-CM

## 2013-05-02 DIAGNOSIS — R519 Headache, unspecified: Secondary | ICD-10-CM

## 2013-05-02 DIAGNOSIS — C9110 Chronic lymphocytic leukemia of B-cell type not having achieved remission: Secondary | ICD-10-CM

## 2013-05-02 DIAGNOSIS — K029 Dental caries, unspecified: Secondary | ICD-10-CM

## 2013-05-02 DIAGNOSIS — R51 Headache: Principal | ICD-10-CM

## 2013-05-02 NOTE — Telephone Encounter (Signed)
Has taken all meds for mouth and still having problems. Feels like his mouth is swollen on the right side and hurts up in the bones on that side an hurts to bite down. Hurts when he brushes his teeth also and he has to eat on the left side. I inquired about maybe seeing a dentist but he said that wasn't an option right now. Wants to know what you advise

## 2013-05-03 ENCOUNTER — Ambulatory Visit (HOSPITAL_COMMUNITY)
Admission: RE | Admit: 2013-05-03 | Discharge: 2013-05-03 | Disposition: A | Discharge status: Home / Self Care | Payer: Medicare Other | Source: Ambulatory Visit | Attending: Family Medicine | Admitting: Family Medicine

## 2013-05-03 DIAGNOSIS — C8590 Non-Hodgkin lymphoma, unspecified, unspecified site: Secondary | ICD-10-CM

## 2013-05-03 DIAGNOSIS — R519 Headache, unspecified: Secondary | ICD-10-CM

## 2013-05-03 DIAGNOSIS — C9110 Chronic lymphocytic leukemia of B-cell type not having achieved remission: Secondary | ICD-10-CM

## 2013-05-03 DIAGNOSIS — R51 Headache: Secondary | ICD-10-CM | POA: Insufficient documentation

## 2013-05-03 DIAGNOSIS — K029 Dental caries, unspecified: Secondary | ICD-10-CM

## 2013-05-03 NOTE — Telephone Encounter (Signed)
See previous note plan to order xray and take it from there, xray is being ordered now

## 2013-05-03 NOTE — Addendum Note (Signed)
Addended by: [name] on: 05/03/2013 02:57 PM   Modules accepted: Orders

## 2013-05-03 NOTE — Telephone Encounter (Signed)
Patient aware.

## 2013-05-03 NOTE — Telephone Encounter (Signed)
An xray of the sinus and facial bones wil be useful, I will order let him know, pls

## 2013-05-06 DIAGNOSIS — F4321 Adjustment disorder with depressed mood: Secondary | ICD-10-CM | POA: Insufficient documentation

## 2013-05-06 NOTE — Assessment & Plan Note (Signed)
Urgent for dental work, limited due to funds, working on this however

## 2013-05-06 NOTE — Assessment & Plan Note (Signed)
Improved. Pt applauded on succesful weight loss through lifestyle change, and encouraged to continue same. Weight loss goal set for the next several months.  

## 2013-05-06 NOTE — Assessment & Plan Note (Signed)
Uncontrolled Hyperlipidemia:Low fat diet discussed and encouraged.

## 2013-05-06 NOTE — Assessment & Plan Note (Signed)
Referred for surgical opiunion

## 2013-05-06 NOTE — Assessment & Plan Note (Signed)
Recent loss of spouse to cancer. Pt dealing with it as well as one could hope for. Reports good family support and faith in GOD will see him through. He also has availability to hospice services

## 2013-05-06 NOTE — Progress Notes (Signed)
   Subjective:    Patient ID: [name], male    DOB: 08-Aug-1945, 68 y.o.   MRN: [id number]  HPI  The PT is here for follow up and re-evaluation of chronic medical conditions, medication management and review of any available recent lab and radiology data.  Preventive health is updated, specifically  Cancer screening and Immunization.   Recently advised by oncology to have surgical opinion on inguinal  hernia .He had biopsy of neck mass which was benign. Unfortunately, since last visit, he lost his wife, who he states was terminally ill and suffering , so in some ways he is at peace with this The PT denies any adverse reactions to current medications since the last visit.  5 day h/o sore places in his mouth. He has been maintained on antibiotics by oncology, due to immunodeficiency and obvious dental caries, which he is unable to afford taking care of currently. Denies any recent fever or chills Also has increased unilateral loss of vision due to cataract , which he intends to have done in the near future also     Review of Systems See HPI Denies recent fever or chills. Denies sinus pressure, nasal congestion, ear pain or sore throat. Denies chest congestion, productive cough or wheezing. Denies chest pains, palpitations and leg swelling Denies abdominal pain, nausea, vomiting,diarrhea or constipation.   Denies dysuria, frequency, hesitancy or incontinence. Denies joint pain, swelling and limitation in mobility. Denies headaches, seizures, numbness, or tingling.         Objective:   Physical Exam Patient alert and oriented and in no cardiopulmonary distress.Appears older than stated age , chronically ill appearing  HEENT: No facial asymmetry, EOMI, no sinus tenderness,  oropharynx pink and moist.  Neck adequate ROM no adenopathy.Dental caries , with many missing teeth. White coating on tongue, no ulcers seen in mouth  Chest: Clear to auscultation bilaterally.  CVS: S1,  S2 systolic  murmur, no S3.  ABD: Soft non tender. Bowel sounds normal.  Ext: No edema  MS: Adequate though reduced  ROM spine, shoulders, hips and knees.  Skin: Intact, no ulcerations or rash noted.  Psych: Good eye contact,flat  affect. Memory impaired, depressed appearing.  CNS: CN 2-12 intact, power, tone and sensation normal throughout.        Assessment & Plan:  Oral thrush Nystatin prescribed  Indirect inguinal hernia Referred for surgical opiunion  HYPERLIPIDEMIA Uncontrolled Hyperlipidemia:Low fat diet discussed and encouraged.    Dental infection Urgent for dental work, limited due to funds, working on this however  Essential hypertension, benign Controlled, no change in medication   OBESITY Improved. Pt applauded on succesful weight loss through lifestyle change, and encouraged to continue same. Weight loss goal set for the next several months.   Mourning Recent loss of spouse to cancer. Pt dealing with it as well as one could hope for. Reports good family support and faith in GOD will see him through. He also has availability to hospice services

## 2013-05-06 NOTE — Assessment & Plan Note (Signed)
Nystatin prescribed

## 2013-05-06 NOTE — Assessment & Plan Note (Signed)
Controlled, no change in medication  

## 2013-05-08 ENCOUNTER — Other Ambulatory Visit (HOSPITAL_COMMUNITY): Payer: Self-pay | Admitting: Oncology

## 2013-05-08 DIAGNOSIS — C9110 Chronic lymphocytic leukemia of B-cell type not having achieved remission: Secondary | ICD-10-CM

## 2013-05-08 DIAGNOSIS — D801 Nonfamilial hypogammaglobulinemia: Secondary | ICD-10-CM

## 2013-05-08 DIAGNOSIS — C8190 Hodgkin lymphoma, unspecified, unspecified site: Secondary | ICD-10-CM

## 2013-05-08 MED ORDER — ACYCLOVIR 200 MG PO CAPS: 200.0000 mg | ORAL_CAPSULE | Freq: Two times a day (BID) | ORAL | Status: DC

## 2013-05-11 ENCOUNTER — Encounter (HOSPITAL_COMMUNITY): Payer: Medicare Other | Attending: Hematology and Oncology | Admitting: Oncology

## 2013-05-11 ENCOUNTER — Encounter (HOSPITAL_COMMUNITY): Payer: Medicare Other | Attending: Oncology

## 2013-05-11 DIAGNOSIS — Z452 Encounter for adjustment and management of vascular access device: Secondary | ICD-10-CM

## 2013-05-11 DIAGNOSIS — J029 Acute pharyngitis, unspecified: Secondary | ICD-10-CM | POA: Insufficient documentation

## 2013-05-11 DIAGNOSIS — C8190 Hodgkin lymphoma, unspecified, unspecified site: Secondary | ICD-10-CM

## 2013-05-11 DIAGNOSIS — C9110 Chronic lymphocytic leukemia of B-cell type not having achieved remission: Secondary | ICD-10-CM

## 2013-05-11 DIAGNOSIS — J069 Acute upper respiratory infection, unspecified: Secondary | ICD-10-CM

## 2013-05-11 DIAGNOSIS — B379 Candidiasis, unspecified: Secondary | ICD-10-CM

## 2013-05-11 LAB — CBC WITH DIFFERENTIAL/PLATELET
Basophils Absolute: 0 10*3/uL (ref 0.0–0.1)
Basophils Relative: 2 % — ABNORMAL HIGH (ref 0–1)
Eosinophils Absolute: 0.2 10*3/uL (ref 0.0–0.7)
Eosinophils Relative: 14 % — ABNORMAL HIGH (ref 0–5)
HEMATOCRIT: 36.4 % — AB (ref 39.0–52.0)
HEMOGLOBIN: 12.7 g/dL — AB (ref 13.0–17.0)
LYMPHS PCT: 45 % (ref 12–46)
Lymphs Abs: 0.8 10*3/uL (ref 0.7–4.0)
MCH: 32.1 pg (ref 26.0–34.0)
MCHC: 34.9 g/dL (ref 30.0–36.0)
MCV: 91.9 fL (ref 78.0–100.0)
MONOS PCT: 21 % — AB (ref 3–12)
Monocytes Absolute: 0.4 10*3/uL (ref 0.1–1.0)
NEUTROS ABS: 0.3 10*3/uL — AB (ref 1.7–7.7)
NEUTROS PCT: 18 % — AB (ref 43–77)
Platelets: 128 10*3/uL — ABNORMAL LOW (ref 150–400)
RBC: 3.96 MIL/uL — AB (ref 4.22–5.81)
RDW: 15.4 % (ref 11.5–15.5)
Smear Review: ADEQUATE
WBC: 1.7 10*3/uL — AB (ref 4.0–10.5)

## 2013-05-11 LAB — COMPREHENSIVE METABOLIC PANEL
ALBUMIN: 3.6 g/dL (ref 3.5–5.2)
ALK PHOS: 122 U/L — AB (ref 39–117)
ALT: 19 U/L (ref 0–53)
AST: 28 U/L (ref 0–37)
BILIRUBIN TOTAL: 0.4 mg/dL (ref 0.3–1.2)
BUN: 12 mg/dL (ref 6–23)
CHLORIDE: 101 meq/L (ref 96–112)
CO2: 21 mEq/L (ref 19–32)
Calcium: 9.2 mg/dL (ref 8.4–10.5)
Creatinine, Ser: 1.36 mg/dL — ABNORMAL HIGH (ref 0.50–1.35)
GFR calc non Af Amer: 52 mL/min — ABNORMAL LOW (ref >90)
GFR, EST AFRICAN AMERICAN: 60 mL/min — AB (ref >90)
GLUCOSE: 117 mg/dL — AB (ref 70–99)
Potassium: 4.4 mEq/L (ref 3.7–5.3)
Sodium: 138 mEq/L (ref 137–147)
Total Protein: 6.7 g/dL (ref 6.0–8.3)

## 2013-05-11 LAB — LACTATE DEHYDROGENASE: LDH: 279 U/L — AB (ref 94–250)

## 2013-05-11 LAB — SEDIMENTATION RATE: Sed Rate: 45 mm/hr — ABNORMAL HIGH (ref 0–16)

## 2013-05-11 MED ORDER — AMOXICILLIN-POT CLAVULANATE 875-125 MG PO TABS: 1.0000 | ORAL_TABLET | Freq: Two times a day (BID) | ORAL | Status: DC

## 2013-05-11 MED ORDER — HEPARIN SOD (PORK) LOCK FLUSH 100 UNIT/ML IV SOLN
500.0000 [IU] | Freq: Once | INTRAVENOUS | Status: AC
  Administered 2013-05-11: 500 [IU] via INTRAVENOUS

## 2013-05-11 MED ORDER — HEPARIN SOD (PORK) LOCK FLUSH 100 UNIT/ML IV SOLN
INTRAVENOUS | Status: AC
  Filled 2013-05-11: qty 5

## 2013-05-11 MED ORDER — SODIUM CHLORIDE 0.9 % IJ SOLN
10.0000 mL | INTRAMUSCULAR | Status: DC | PRN
  Administered 2013-05-11: 10 mL via INTRAVENOUS

## 2013-05-11 NOTE — Progress Notes (Signed)
[name] is here for a port flush and reported a sore throat x 1 day.  Therefore, I was asked to see the patient as a work-in today.  [name] notes that he woke up this AM with a sore throat.  He notes on and off fevers at home, but denies any documentation of degree of temperature.  He notes that he say Dr. Moshe Cipro recently who discovered "mouth sores" and he was put on Nystatin for suspected candidiasis.  He notes headaches with sinus pressure as well.    Gen: blunted affect which is normal for him, NAD, with mask on HEENT: posterior pharynx erythema with frontal sinus palpation tenderness.  Halitosis noted, but could be due to poor dentition and cleaning.  Yellow coating on posterior tongue.  No clear oral candidiasis although on the left upper buccal mucosa at the level of upper gums there is some whiteness of mucosa and could be mild thrush.  Tenderness to throat on palpation. Skin: Warm and dry Neuro: No focal deficits  Assessment: 1. Sore throat 2. URI 3. Oral candidiasis  Plan: 1. Rx Augmentin x 7 days 2. Continue PO Nystatin 3. Follow-up as scheduled.   Patient and plan discussed with Dr. Farrel Gobble and he is in agreement with the aforementioned.   KEFALAS,THOMAS

## 2013-05-11 NOTE — Progress Notes (Signed)
[name] presented for Portacath access and flush.  Portacath located rightchest wall accessed with  H 20 needle.  Good blood return present. Portacath flushed with 30ml NS and 500U/85ml Heparin and needle removed intact.  Procedure tolerated well and without incident.   Patient requested to speak to Tom the pa. He woke this am with a sore throat. Tom the pa to see the pt.

## 2013-06-01 ENCOUNTER — Encounter (HOSPITAL_COMMUNITY): Payer: Self-pay

## 2013-06-01 ENCOUNTER — Encounter (HOSPITAL_COMMUNITY): Payer: Medicare Other | Attending: Oncology

## 2013-06-01 VITALS — BP 113/65 | HR 71 | Temp 97.8°F | Resp 18 | Wt 208.8 lb

## 2013-06-01 DIAGNOSIS — C9110 Chronic lymphocytic leukemia of B-cell type not having achieved remission: Secondary | ICD-10-CM | POA: Insufficient documentation

## 2013-06-01 DIAGNOSIS — C8190 Hodgkin lymphoma, unspecified, unspecified site: Secondary | ICD-10-CM

## 2013-06-01 DIAGNOSIS — D801 Nonfamilial hypogammaglobulinemia: Secondary | ICD-10-CM

## 2013-06-01 DIAGNOSIS — T451X5A Adverse effect of antineoplastic and immunosuppressive drugs, initial encounter: Secondary | ICD-10-CM

## 2013-06-01 DIAGNOSIS — D72819 Decreased white blood cell count, unspecified: Secondary | ICD-10-CM

## 2013-06-01 DIAGNOSIS — D6959 Other secondary thrombocytopenia: Secondary | ICD-10-CM

## 2013-06-01 NOTE — Progress Notes (Signed)
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 CLL  . history of Hodgkin's disease    CURRENT THERAPY: Watchful expectation.  INTERVAL HISTORY: [name] 68 y.o. male returns for followup of chronic lymphocytic leukemia on no treatment at this time with a bone marrow examination on 01/22/2013 for pancytopenia revealing several small lymphoid aggregates with minimal B-cell CLL expression. He was treated for upper respiratory infection last month with Augmentin for 7 days with improvement. After his wife's recent death, he does have support at home from his son. He denies any fever or chills but does have an area on the right buccal mucosa that is painful. He denies any diarrhea, constipation, melena, hematochezia, hematuria, easy satiety, cough, wheezing, lower extremity swelling or redness, skin rash, headache, or seizures.  MEDICAL HISTORY: Past Medical History  Diagnosis Date  . Aortic stenosis   . Atrial fibrillation   . Coronary atherosclerosis of native coronary artery   . Hyperlipidemia   . Essential hypertension, benign   . BPH (benign prostatic hypertrophy)   . Elevated WBC count   . Arthritis   . Acid reflux   . Hypogammaglobulinemia   . Pre-diabetes 08/04/2011  . CLL (chronic lymphoblastic leukemia)   . Hodgkin's disease   . Kidney stones   . Wears glasses   . Wears dentures   . HOH (hard of hearing)   . Indirect inguinal  hernia 04/13/2013    INTERIM HISTORY: has HODGKIN'S DISEASE; CLL; HYPERLIPIDEMIA; OBESITY; CORONARY ATHEROSCLEROSIS NATIVE CORONARY ARTERY; AORTIC STENOSIS; Atrial fibrillation; OTHER DYSPHAGIA; AORTIC VALVE REPLACEMENT, HX OF; Essential hypertension, benign; GERD (gastroesophageal reflux disease); Thrombocytopenia; Hypogammaglobulinemia, acquired; Anemia; Tachyarrhythmia; Pre-diabetes; Dental infection; Piles (hemorrhoids); Reduced vision; Routine general medical examination at a health care facility; Neck mass; Cholelithiasis; Indirect inguinal hernia; Oral thrush; and Mourning on his problem list.   CLL diagnosed in 2004, S/p Chemotherapy, with recurrence in 2013, s/p 6 cycles of Bendamustine and Rituxan followed by maintenance Rituxan. Rituxan was discontinued and he is on watchful expectation at present. He was also diagnosed with Hodgkin's disease and treated in 2006. In view of persistent pancytopenia he underwent bone marrow aspiration and biopsy on 01/22/2013 that revealed  hypercellular bone marrow for age and several lymphoid aggregates are present. Flowcytometry failed to show any significant B cell population. IHC's positive for mainly T cells and minimal B-cell population focal coexpression CD5 stain and negative for cyclin D1 suggestive of minimal involvement of the CLL.Marland Kitchen Cytogenetics were normal. He also had the biopsy of tongue lesion by Dr. Benjamine Mola 01/26/2013 and that revealed granulation tissue negative for malignancy  ALLERGIES:  has No Known Allergies.  MEDICATIONS: has a current medication list which includes the following prescription(s): acetaminophen, acyclovir, aspirin ec, folic acid, metoprolol, multiple vitamins-minerals, omega 3, omeprazole, sulfamethoxazole-trimethoprim, amoxicillin-clavulanate, nystatin, and rosuvastatin.  SURGICAL HISTORY:  Past Surgical History  Procedure Laterality Date  . Eye surgery    . Aortic valve replacement  10/09    67m Magna Pericardial   .  Esophagogastroduodenoscopy      scrapping of throat and  stretching  . Lithotripsy    . Bone marrow aspiration    . Portacath placement    . 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 Colon surgery    . Dental surgery  08/2012  . Multiple tooth extractions    . Mass excision N/A 12/05/2012    Procedure: EXCISION MIDLINE NECK MASS;  Surgeon: Ascencion Dike, MD;  Location: [facility];  Service: ENT;  Laterality: N/A;  . Tongue biopsy  12/2012    FAMILY HISTORY: family history includes Blindness in his mother; Cancer in his father; Heart failure in his mother.  SOCIAL HISTORY:  reports that he has never smoked. He has never used smokeless tobacco. He reports that he does not drink alcohol or use illicit drugs.  REVIEW OF SYSTEMS:  Other than that discussed above is noncontributory.  PHYSICAL EXAMINATION: ECOG PERFORMANCE STATUS: 1 - Symptomatic but completely ambulatory  Blood pressure 113/65, pulse 71, temperature 97.8 F (36.6 C), temperature source Oral, resp. rate 18, weight 208 lb 12.8 oz (94.711 kg).  GENERAL:alert, no distress and comfortable SKIN: skin color, texture, turgor are normal, no rashes or significant lesions EYES: PERLA; Conjunctiva are pink and non-injected, sclera clear OROPHARYNX:no exudate, no erythema on lips, buccal mucosa, or tongue. Right buccal mucosal punctate white lesion. NECK: supple, thyroid normal size, non-tender, without nodularity. No masses CHEST: Increased AP diameter with no gynecomastia. LYMPH:  no palpable lymphadenopathy in the cervical, axillary or inguinal LUNGS: clear to auscultation and percussion with normal breathing effort HEART: regular rate & rhythm and no murmurs. ABDOMEN:abdomen soft, non-tender and normal bowel sounds. Spleen not palpable. Right indirect inguinal hernia, easily reducible. MUSCULOSKELETAL:no cyanosis of digits and no clubbing. Range of motion normal.  NEURO: alert & oriented x 3 with fluent  speech, no focal motor/sensory deficits   LABORATORY DATA: Infusion on 05/11/2013  Component Date Value Ref Range Status  . WBC 05/11/2013 1.7* 4.0 - 10.5 K/uL Final  . RBC 05/11/2013 3.96* 4.22 - 5.81 MIL/uL Final  . Hemoglobin 05/11/2013 12.7* 13.0 - 17.0 g/dL Final  . HCT 05/11/2013 36.4* 39.0 - 52.0 % Final  . MCV 05/11/2013 91.9  78.0 - 100.0 fL Final  . MCH 05/11/2013 32.1  26.0 - 34.0 pg Final  . MCHC 05/11/2013 34.9  30.0 - 36.0 g/dL Final  . RDW 05/11/2013 15.4  11.5 - 15.5 % Final  . Platelets 05/11/2013 128* 150 - 400 K/uL Final  . Neutrophils Relative % 05/11/2013 18* 43 - 77 % Final  . Neutro Abs 05/11/2013 0.3* 1.7 - 7.7 K/uL Final  . Lymphocytes Relative 05/11/2013 45  12 - 46 % Final  . Lymphs Abs 05/11/2013 0.8  0.7 - 4.0 K/uL Final  . Monocytes Relative 05/11/2013 21* 3 - 12 % Final  . Monocytes Absolute 05/11/2013 0.4  0.1 - 1.0 K/uL Final  . Eosinophils Relative 05/11/2013 14* 0 - 5 % Final  . Eosinophils Absolute 05/11/2013 0.2  0.0 - 0.7 K/uL Final  . Basophils Relative 05/11/2013 2* 0 - 1 % Final  . Basophils Absolute 05/11/2013 0.0  0.0 - 0.1 K/uL Final  . Smear Review 05/11/2013 PLATELET CLUMPS NOTED ON SMEAR, COUNT APPEARS ADEQUATE   Final  . Sodium 05/11/2013 138  137 - 147 mEq/L Final  . Potassium 05/11/2013 4.4  3.7 - 5.3 mEq/L Final  . Chloride 05/11/2013 101  96 - 112 mEq/L Final  . CO2 05/11/2013 21  19 - 32  mEq/L Final  . Glucose, Bld 05/11/2013 117* 70 - 99 mg/dL Final  . BUN 05/11/2013 12  6 - 23 mg/dL Final  . Creatinine, Ser 05/11/2013 1.36* 0.50 - 1.35 mg/dL Final  . Calcium 05/11/2013 9.2  8.4 - 10.5 mg/dL Final  . Total Protein 05/11/2013 6.7  6.0 - 8.3 g/dL Final  . Albumin 05/11/2013 3.6  3.5 - 5.2 g/dL Final  . AST 05/11/2013 28  0 - 37 U/L Final  . ALT 05/11/2013 19  0 - 53 U/L Final  . Alkaline Phosphatase 05/11/2013 122* 39 - 117 U/L Final  . Total Bilirubin 05/11/2013 0.4  0.3 - 1.2 mg/dL Final  . GFR calc non Af Amer 05/11/2013  52* >90 mL/min Final  . GFR calc Af Amer 05/11/2013 60* >90 mL/min Final   Comment: (NOTE)                          The eGFR has been calculated using the CKD EPI equation.                          This calculation has not been validated in all clinical situations.                          eGFR's persistently <90 mL/min signify possible Chronic Kidney                          Disease.  Marland Kitchen LDH 05/11/2013 279* 94 - 250 U/L Final   SLIGHT HEMOLYSIS  . Sed Rate 05/11/2013 45* 0 - 16 mm/hr Final    PATHOLOGY: Peripheral smear failed to reveal evidence of smudge cells. Platelet clumping is noted.  Urinalysis    Component Value Date/Time   COLORURINE YELLOW 02/28/2012 0116   APPEARANCEUR CLEAR 02/28/2012 0116   LABSPEC 1.020 02/28/2012 0116   PHURINE 5.5 02/28/2012 0116   GLUCOSEU NEGATIVE 02/28/2012 0116   HGBUR NEGATIVE 02/28/2012 0116   BILIRUBINUR NEGATIVE 02/28/2012 0116   KETONESUR NEGATIVE 02/28/2012 0116   PROTEINUR NEGATIVE 02/28/2012 0116   UROBILINOGEN 0.2 02/28/2012 0116   NITRITE NEGATIVE 02/28/2012 0116   LEUKOCYTESUR NEGATIVE 02/28/2012 0116    RADIOGRAPHIC STUDIES: Dg Facial Bones Complete  05/03/2013   CLINICAL DATA:  Facial pain.  EXAM: FACIAL BONES COMPLETE 3+V  COMPARISON:  None.  FINDINGS: There is no evidence of fracture or other significant bone abnormality. No orbital emphysema or sinus air-fluid levels are seen.  IMPRESSION: Negative.   Electronically Signed   By: Kerby Moors M.D.   On: 05/03/2013 16:26    ASSESSMENT:  #1. Chronic lymphocytic leukemia with minimal residual disease on bone marrow aspiration, and no need of therapy. #2. Aphthous ulcer right buccal mucosa. #3. Right inguinal hernia, easily reducible. #4. History of Hodgkin's disease, nodular sclerosis type, no evidence of disease. #5. 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   PLAN:  #1. Continue watchful expectation. Patient was told to call should he develop fever,  worsening fatigue, dark urine, or new  lymphadenopathy. #2. He has seen Dr. Romona Curls for his hernia. It was decided not to do anything surgically because of his concurrent morbidity. #3. Followup in 2 months with CBC, chem profile, beta 2 microglobulin, LDH, and reticulocyte count. #4. Campho-Phenique the right buccal mucosal lesion for relief. Rezulin half strength peroxide 2 or 3 times per day. #  5. Call if develops fever.   All questions were answered. The patient knows to call the clinic with any problems, questions or concerns. We can certainly see the patient much sooner if necessary.   I spent 25 minutes counseling the patient face to face. The total time spent in the appointment was 30 minutes.    Doroteo Bradford, MD 06/01/2013 1:03 PM

## 2013-06-01 NOTE — Patient Instructions (Signed)
[facility] Discharge Instructions  RECOMMENDATIONS MADE BY THE CONSULTANT AND ANY TEST RESULTS WILL BE SENT TO YOUR REFERRING PHYSICIAN.  EXAM FINDINGS BY THE PHYSICIAN TODAY AND SIGNS OR SYMPTOMS TO REPORT TO CLINIC OR PRIMARY PHYSICIAN: Exam and findings as discussed by Dr. Barnet Glasgow.  You can use Campho phenique on the area in your right cheek.  You can also rinse with 1/2 strength hydrogen peroxide.  Report fevers, night sweats, unexplained weight loss or other problems.   MEDICATIONS PRESCRIBED:  none  INSTRUCTIONS/FOLLOW-UP: Port flushes every 6 weeks and to see MD in 2 months.  Thank you for choosing [facility] to provide your oncology and hematology care.  To afford each patient quality time with our providers, please arrive at least 15 minutes before your scheduled appointment time.  With your help, our goal is to use those 15 minutes to complete the necessary work-up to ensure our physicians have the information they need to help with your evaluation and healthcare recommendations.    Effective January 1st, 2014, we ask that you re-schedule your appointment with our physicians should you arrive 10 or more minutes late for your appointment.  We strive to give you quality time with our providers, and arriving late affects you and other patients whose appointments are after yours.    Again, thank you for choosing [facility].  Our hope is that these requests will decrease the amount of time that you wait before being seen by our physicians.       _____________________________________________________________  Should you have questions after your visit to [facility], please contact our office at [phone] between the hours of 8:30 a.m. and 5:00 p.m.  Voicemails left after 4:30 p.m. will not be returned until the following business day.  For prescription refill requests, have your pharmacy contact our office with your  prescription refill request.

## 2013-06-05 ENCOUNTER — Other Ambulatory Visit (HOSPITAL_COMMUNITY): Payer: Self-pay | Admitting: Oncology

## 2013-06-05 DIAGNOSIS — C9110 Chronic lymphocytic leukemia of B-cell type not having achieved remission: Secondary | ICD-10-CM

## 2013-06-05 DIAGNOSIS — C8190 Hodgkin lymphoma, unspecified, unspecified site: Secondary | ICD-10-CM

## 2013-06-05 DIAGNOSIS — D801 Nonfamilial hypogammaglobulinemia: Secondary | ICD-10-CM

## 2013-06-05 MED ORDER — FOLIC ACID 1 MG PO TABS: 1.0000 mg | ORAL_TABLET | Freq: Every day | ORAL | Status: DC

## 2013-06-20 ENCOUNTER — Encounter: Payer: Medicare Other | Admitting: Family Medicine

## 2013-06-22 ENCOUNTER — Encounter (HOSPITAL_BASED_OUTPATIENT_CLINIC_OR_DEPARTMENT_OTHER): Payer: Medicare Other

## 2013-06-22 DIAGNOSIS — Z452 Encounter for adjustment and management of vascular access device: Secondary | ICD-10-CM

## 2013-06-22 DIAGNOSIS — C9110 Chronic lymphocytic leukemia of B-cell type not having achieved remission: Secondary | ICD-10-CM

## 2013-06-22 MED ORDER — HEPARIN SOD (PORK) LOCK FLUSH 100 UNIT/ML IV SOLN
500.0000 [IU] | Freq: Once | INTRAVENOUS | Status: AC
  Administered 2013-06-22: 500 [IU] via INTRAVENOUS
  Filled 2013-06-22: qty 5

## 2013-06-22 MED ORDER — SODIUM CHLORIDE 0.9 % IJ SOLN
10.0000 mL | INTRAMUSCULAR | Status: DC | PRN
  Administered 2013-06-22: 10 mL via INTRAVENOUS

## 2013-06-22 NOTE — Progress Notes (Signed)
[name] presented for Portacath access and flush. Portacath located lt chest wall accessed with  H 20 needle. Good blood return present. Portacath flushed with 55ml NS and 500U/53ml Heparin and needle removed intact. Procedure without incident. Patient tolerated procedure well.

## 2013-07-04 ENCOUNTER — Other Ambulatory Visit: Payer: Self-pay | Admitting: Adult Health

## 2013-07-25 ENCOUNTER — Ambulatory Visit (INDEPENDENT_AMBULATORY_CARE_PROVIDER_SITE_OTHER): Payer: Medicare Other | Admitting: Cardiology

## 2013-07-25 ENCOUNTER — Encounter: Payer: Self-pay | Admitting: Cardiology

## 2013-07-25 VITALS — BP 129/59 | HR 67 | Ht 70.0 in | Wt 217.0 lb

## 2013-07-25 DIAGNOSIS — I4891 Unspecified atrial fibrillation: Secondary | ICD-10-CM

## 2013-07-25 DIAGNOSIS — I2510 Atherosclerotic heart disease of native coronary artery without angina pectoris: Secondary | ICD-10-CM

## 2013-07-25 DIAGNOSIS — I10 Essential (primary) hypertension: Secondary | ICD-10-CM

## 2013-07-25 DIAGNOSIS — I359 Nonrheumatic aortic valve disorder, unspecified: Secondary | ICD-10-CM

## 2013-07-25 NOTE — Assessment & Plan Note (Signed)
No change to current regimen. 

## 2013-07-25 NOTE — Patient Instructions (Signed)
Your physician wants you to follow-up in: 6 months You will receive a reminder letter in the mail two months in advance. If you don't receive a letter, please call our office to schedule the follow-up appointment.    Your physician has recommended you make the following change in your medication:    Cut your Crestor dose in half and see how you tolerate medication    Thank you for choosing [facility] !

## 2013-07-25 NOTE — Progress Notes (Signed)
Clinical Summary [name] is a 68 y.o.male last seen in October 2014 by Ms. Lawrence NP. He continues to follow in the oncology clinic for management of Hodgkin's disease and CLL. He denies any chest pain or progressive shortness of breath.  Lab work from February showed hemoglobin 12.7, platelets 128, potassium 4.4, BUN 12, creatinine 1.3, normal AST and ALT.  Last echocardiogram from August 2011 showed mild LVH with LVEF 60-65%, normally functioning bioprosthetic AVR, significant MAC with trivial mitral regurgitation, mild left atrial enlargement, PASP 35 mm mercury.  He did stop Crestor several months ago for possible side effects. In retrospect, he states that he is not sure that the symptoms he was feeling were related. We discussed the possibility of trying very low-dose Crestor to see if he might tolerate it.  No Known Allergies  Current Outpatient Prescriptions  Medication Sig Dispense Refill  . acetaminophen (TYLENOL) 500 MG tablet Take 500 mg by mouth every 6 (six) hours as needed for pain.      Marland Kitchen aspirin EC 81 MG tablet Take 81 mg by mouth daily.      . folic acid (FOLVITE) 1 MG tablet Take 1 tablet (1 mg total) by mouth daily.  30 tablet  3  . metoprolol (LOPRESSOR) 50 MG tablet Take 1 tablet (50 mg total) by mouth 2 (two) times daily.  60 tablet  3  . Multiple Vitamins-Minerals (CENTRUM SILVER ULTRA MENS PO) Take 1 tablet by mouth every morning.       . nystatin (MYCOSTATIN) 100000 UNIT/ML suspension 5 mls four times daily, gargle , swish and swallow  120 mL  0  . OMEGA 3 1000 MG CAPS Take 1,000 mg by mouth daily.       Marland Kitchen omeprazole (PRILOSEC) 20 MG capsule Take 20 mg by mouth every morning.       . rosuvastatin (CRESTOR) 20 MG tablet Take 1 tablet (20 mg total) by mouth at bedtime.  30 tablet  11   No current facility-administered medications for this visit.    Past Medical History  Diagnosis Date  . Aortic stenosis   . Atrial fibrillation   . Coronary  atherosclerosis of native coronary artery   . Hyperlipidemia   . Essential hypertension, benign   . BPH (benign prostatic hypertrophy)   . Elevated WBC count   . Arthritis   . Acid reflux   . Hypogammaglobulinemia   . Pre-diabetes 08/04/2011  . CLL (chronic lymphoblastic leukemia)   . Hodgkin's disease   . Kidney stones   . Wears glasses   . Wears dentures   . HOH (hard of hearing)   . Indirect inguinal hernia 04/13/2013    Past Surgical History  Procedure Laterality Date  . Eye surgery    . Aortic valve replacement  10/09    84mm Magna Pericardial   . Esophagogastroduodenoscopy      scrapping of throat and stretching  . Lithotripsy    . Bone marrow aspiration    . Portacath placement    . 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 Colon surgery    . Dental surgery  08/2012  . Multiple tooth extractions    . Mass excision N/A 12/05/2012    Procedure: EXCISION MIDLINE NECK MASS;  Surgeon: Ascencion Dike, MD;  Location: [facility];  Service: ENT;  Laterality: N/A;  . Tongue biopsy  12/2012    Social History Mr. Vallee reports that  he has never smoked. He has never used smokeless tobacco. Mr. Pistilli reports that he does not drink alcohol.  Review of Systems No fevers or chills. Stable appetite. No orthopnea or PND.  Physical Examination Filed Vitals:   07/25/13 0847  BP: 129/59  Pulse: 67   Filed Weights   07/25/13 0847  Weight: 217 lb (98.431 kg)    Chronically ill-appearing, no distress. HEENT: Conjunctiva and lids normal, oropharynx with poor dentition.  Neck: Supple, no elevated jugular venous pressure or bruits.  Lungs: Clear to auscultation, nonlabored.  Cardiac: Regular rate and rhythm, S4, 2/6 systolic murmur at the base, no S3.  Abdomen: Obese, nontender, bowel sounds present.  Extremities: Venous stasis noted, 1-2+ edema, distal pulses diminished.  Skin: Warm and dry, scattered excoriations.  Musculoskeletal: No gross deformities.    Neuropsychiatric: Alert and oriented x3, affect grossly normal.   Problem List and Plan   CORONARY ATHEROSCLEROSIS NATIVE CORONARY ARTERY No active angina, history of SVG to RCA in 2009. Continue medical therapy and observation.  AORTIC STENOSIS Status post bioprosthetic aortic valve in 2009, stable by echocardiogram in 2011. No change in examination. Continue to follow.  Atrial fibrillation He is in sinus rhythm on examination today. Continue aspirin for now with medical complexity and increased risk of bleeding.  Essential hypertension, benign No change to current regimen.    Satira Sark, M.D., F.A.C.C.

## 2013-07-25 NOTE — Assessment & Plan Note (Signed)
He is in sinus rhythm on examination today. Continue aspirin for now with medical complexity and increased risk of bleeding.

## 2013-07-25 NOTE — Assessment & Plan Note (Signed)
No active angina, history of SVG to RCA in 2009. Continue medical therapy and observation.

## 2013-07-25 NOTE — Assessment & Plan Note (Signed)
Status post bioprosthetic aortic valve in 2009, stable by echocardiogram in 2011. No change in examination. Continue to follow.

## 2013-07-27 ENCOUNTER — Encounter (HOSPITAL_COMMUNITY): Payer: Self-pay

## 2013-07-27 ENCOUNTER — Encounter (HOSPITAL_COMMUNITY): Payer: Medicare Other | Attending: Oncology

## 2013-07-27 ENCOUNTER — Encounter (HOSPITAL_COMMUNITY): Payer: Medicare Other

## 2013-07-27 VITALS — BP 113/69 | HR 69 | Temp 97.4°F | Resp 18 | Wt 216.7 lb

## 2013-07-27 DIAGNOSIS — K4090 Unilateral inguinal hernia, without obstruction or gangrene, not specified as recurrent: Secondary | ICD-10-CM | POA: Insufficient documentation

## 2013-07-27 DIAGNOSIS — C8190 Hodgkin lymphoma, unspecified, unspecified site: Secondary | ICD-10-CM | POA: Insufficient documentation

## 2013-07-27 DIAGNOSIS — C9110 Chronic lymphocytic leukemia of B-cell type not having achieved remission: Secondary | ICD-10-CM

## 2013-07-27 DIAGNOSIS — D696 Thrombocytopenia, unspecified: Secondary | ICD-10-CM

## 2013-07-27 DIAGNOSIS — Z954 Presence of other heart-valve replacement: Secondary | ICD-10-CM

## 2013-07-27 DIAGNOSIS — D61818 Other pancytopenia: Secondary | ICD-10-CM | POA: Insufficient documentation

## 2013-07-27 DIAGNOSIS — Z8571 Personal history of Hodgkin lymphoma: Secondary | ICD-10-CM

## 2013-07-27 DIAGNOSIS — I4891 Unspecified atrial fibrillation: Secondary | ICD-10-CM

## 2013-07-27 DIAGNOSIS — D72819 Decreased white blood cell count, unspecified: Secondary | ICD-10-CM

## 2013-07-27 DIAGNOSIS — D801 Nonfamilial hypogammaglobulinemia: Secondary | ICD-10-CM

## 2013-07-27 DIAGNOSIS — D839 Common variable immunodeficiency, unspecified: Secondary | ICD-10-CM | POA: Insufficient documentation

## 2013-07-27 LAB — COMPREHENSIVE METABOLIC PANEL
ALT: 16 U/L (ref 0–53)
AST: 21 U/L (ref 0–37)
Albumin: 3.6 g/dL (ref 3.5–5.2)
Alkaline Phosphatase: 106 U/L (ref 39–117)
BUN: 14 mg/dL (ref 6–23)
CALCIUM: 9 mg/dL (ref 8.4–10.5)
CO2: 26 mEq/L (ref 19–32)
Chloride: 104 mEq/L (ref 96–112)
Creatinine, Ser: 1.26 mg/dL (ref 0.50–1.35)
GFR, EST AFRICAN AMERICAN: 65 mL/min — AB (ref >90)
GFR, EST NON AFRICAN AMERICAN: 57 mL/min — AB (ref >90)
GLUCOSE: 107 mg/dL — AB (ref 70–99)
POTASSIUM: 4.4 meq/L (ref 3.7–5.3)
SODIUM: 141 meq/L (ref 137–147)
Total Bilirubin: 0.4 mg/dL (ref 0.3–1.2)
Total Protein: 6 g/dL (ref 6.0–8.3)

## 2013-07-27 LAB — LACTATE DEHYDROGENASE: LDH: 223 U/L (ref 94–250)

## 2013-07-27 LAB — FERRITIN: Ferritin: 221 ng/mL (ref 22–322)

## 2013-07-27 MED ORDER — HEPARIN SOD (PORK) LOCK FLUSH 100 UNIT/ML IV SOLN
500.0000 [IU] | Freq: Once | INTRAVENOUS | Status: AC
  Administered 2013-07-27: 500 [IU] via INTRAVENOUS
  Filled 2013-07-27: qty 5

## 2013-07-27 MED ORDER — SODIUM CHLORIDE 0.9 % IJ SOLN
10.0000 mL | INTRAMUSCULAR | Status: DC | PRN
  Administered 2013-07-27: 10 mL via INTRAVENOUS

## 2013-07-27 NOTE — Progress Notes (Signed)
[facility]  OFFICE PROGRESS NOTE  [name], MD [address] [place] [address]  DIAGNOSIS: CLL (chronic lymphocytic leukemia) - Plan: heparin lock flush 100 unit/mL, sodium chloride 0.9 % injection 10 mL, CBC with Differential, Comprehensive metabolic panel, Lactate dehydrogenase, Reticulocytes, Beta 2 microglobulin, serum  Pancytopenia - Plan: heparin lock flush 100 unit/mL, sodium chloride 0.9 % injection 10 mL, CBC with Differential, Comprehensive metabolic panel, Lactate dehydrogenase, Reticulocytes, Beta 2 microglobulin, serum  Hodgkin's disease, unspecified(201.90) - Plan: heparin lock flush 100 unit/mL, sodium chloride 0.9 % injection 10 mL, CBC with Differential, Comprehensive metabolic panel, Lactate dehydrogenase, Reticulocytes, Beta 2 microglobulin, serum  Hypogammaglobulinemia, acquired - Plan: CBC with Differential, Comprehensive metabolic panel, Lactate dehydrogenase, Reticulocytes, Beta 2 microglobulin, serum  AORTIC VALVE REPLACEMENT, HX OF  Atrial fibrillation  Hernia, inguinal, right  Chief Complaint  Patient presents with  . Chronic lymphocytic leukemia  . History of Hodgkin's disease    CURRENT THERAPY: Watchful expectation and laboratory surveillance.   INTERVAL HISTORY: [name] 68 y.o. male returns for followup of chronic lymphocytic leukemia on no treatment at this time having undergone bone marrow examination on 01/22/2013 due to pancytopenia at which time several small lymphoid aggregates with minimal B-cell CLL expression was seen. No specific therapy was rendered. In 2006 he was treated for Hodgkin's disease. CLL was originally diagnosed in 2004 with recurrence in 2013 at which time he received 6 cycles of bendamustine and Rituxan followed by Rituxan maintenance for 2 years. He is still lamenting over the death of his wife several months ago. He does have chills on occasion but has  not required any antibiotics over the past 2 months. Right inguinal hernia is not painful but is present. He does have nasal drip without sore throat, earache, or productive cough. He denies any epistaxis, hemoptysis, easy satiety, nausea, vomiting, diarrhea, constipation, dysuria, but with occasional urinary incontinence. He denies any lower extremity swelling or redness, joint pain, skin rash, headache, or seizures.  MEDICAL HISTORY: Past Medical History  Diagnosis Date  . Aortic stenosis   . Atrial fibrillation   . Coronary atherosclerosis of native coronary artery   . Hyperlipidemia   . Essential hypertension, benign   . BPH (benign prostatic hypertrophy)   . Elevated WBC count   . Arthritis   . Acid reflux   . Hypogammaglobulinemia   . Pre-diabetes 08/04/2011  . CLL (chronic lymphoblastic leukemia)   . Hodgkin's disease   . Kidney stones   . Wears glasses   . Wears dentures   . HOH (hard of hearing)   . Indirect inguinal hernia 04/13/2013    INTERIM HISTORY: has HODGKIN'S DISEASE; CLL; HYPERLIPIDEMIA; OBESITY; CORONARY ATHEROSCLEROSIS NATIVE CORONARY ARTERY; AORTIC STENOSIS; Atrial fibrillation; OTHER DYSPHAGIA; AORTIC VALVE REPLACEMENT, HX OF; Essential hypertension, benign; GERD (gastroesophageal reflux disease); Thrombocytopenia; Hypogammaglobulinemia, acquired; Anemia; Pre-diabetes; Dental infection; Piles (hemorrhoids); Reduced vision; Routine general medical examination at a health care facility; Neck mass; Cholelithiasis; Indirect inguinal hernia; Oral thrush; and Mourning on his problem list.   CLL diagnosed in 2004, S/p Chemotherapy, with recurrence in 2013, s/p 6 cycles of Bendamustine and Rituxan followed by maintenance Rituxan. Rituxan was discontinued and he is on watchful expectation at present. He was also diagnosed with Hodgkin's disease and treated in 2006. In view of persistent pancytopenia he underwent bone marrow aspiration and biopsy on 01/22/2013 that revealed  hypercellular bone marrow for age and several lymphoid aggregates  are present. Flowcytometry failed to show any significant B cell population. IHC's positive for mainly T cells and minimal B-cell population focal coexpression CD5 stain and negative for cyclin D1 suggestive of minimal involvement of the CLL.[employer] were normal. He also had the biopsy of tongue lesion by Dr. Benjamine Mola 01/26/2013 and that revealed granulation tissue negative for malignancy  ALLERGIES:  has No Known Allergies.  MEDICATIONS: has a current medication list which includes the following prescription(s): acetaminophen, acyclovir, aspirin ec, folic acid, metoprolol, multiple vitamins-minerals, omega 3, omeprazole, sulfamethoxazole-trimethoprim, nystatin, and rosuvastatin, and the following Facility-Administered Medications: sodium chloride.  SURGICAL HISTORY:  Past Surgical History  Procedure Laterality Date  . Eye surgery    . Aortic valve replacement  10/09    69m Magna Pericardial   . Esophagogastroduodenoscopy      scrapping of throat and stretching  . Lithotripsy    . Bone marrow aspiration    . Portacath placement    . 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 Colon surgery    . Dental surgery  08/2012  . Multiple tooth extractions    . Mass excision N/A 12/05/2012    Procedure: EXCISION MIDLINE NECK MASS;  Surgeon: SAscencion Dike MD;  [facility]  Service: ENT;  Laterality: N/A;  . Tongue biopsy  12/2012    FAMILY HISTORY: family history includes Blindness in his mother; Cancer in his father; Heart failure in his mother.  SOCIAL HISTORY:  reports that he has never smoked. He has never used smokeless tobacco. He reports that he does not drink alcohol or use illicit drugs.  REVIEW OF SYSTEMS:  Other than that discussed above is noncontributory.  PHYSICAL EXAMINATION: ECOG PERFORMANCE STATUS: 1 - Symptomatic but completely ambulatory  Blood pressure 113/69, pulse 69,  temperature 97.4 F (36.3 C), resp. rate 18, weight 216 lb 11.2 oz (98.294 kg).  GENERAL:alert, no distress and comfortable SKIN: skin color, texture, turgor are normal, no rashes or significant lesions EYES: PERLA; Conjunctiva are pink and non-injected, sclera clear SINUSES: No redness or tenderness over maxillary or ethmoid sinuses OROPHARYNX:no exudate, no erythema on lips, buccal mucosa. No further evidence of left lateral tongue lesion. NECK: supple, thyroid normal size, non-tender, without nodularity. No masses CHEST: Midline scar that is well-healed. LifePort in place. LYMPH:  no palpable lymphadenopathy in the cervical, axillary or inguinal LUNGS: clear to auscultation and percussion with normal breathing effort HEART: regular rate & rhythm and no murmurs. ABDOMEN:abdomen soft, non-tender and normal bowel sounds. Right indirect inguinal hernia, easily reducible. Spleen not palpable. MUSCULOSKELETAL:no cyanosis of digits and no clubbing. Range of motion normal.  NEURO: alert & oriented x 3 with fluent speech, no focal motor/sensory deficits   LABORATORY DATA: Appointment on 07/27/2013  Component Date Value Ref Range Status  . LDH 07/27/2013 223  94 - 250 U/L Final  . Sodium 07/27/2013 141  137 - 147 mEq/L Final  . Potassium 07/27/2013 4.4  3.7 - 5.3 mEq/L Final  . Chloride 07/27/2013 104  96 - 112 mEq/L Final  . CO2 07/27/2013 26  19 - 32 mEq/L Final  . Glucose, Bld 07/27/2013 107* 70 - 99 mg/dL Final  . BUN 07/27/2013 14  6 - 23 mg/dL Final  . Creatinine, Ser 07/27/2013 1.26  0.50 - 1.35 mg/dL Final  . Calcium 07/27/2013 9.0  8.4 - 10.5 mg/dL Final  . Total Protein 07/27/2013 6.0  6.0 - 8.3 g/dL Final  .  Albumin 07/27/2013 3.6  3.5 - 5.2 g/dL Final  . AST 07/27/2013 21  0 - 37 U/L Final  . ALT 07/27/2013 16  0 - 53 U/L Final  . Alkaline Phosphatase 07/27/2013 106  39 - 117 U/L Final  . Total Bilirubin 07/27/2013 0.4  0.3 - 1.2 mg/dL Final  . GFR calc non Af Amer 07/27/2013  57* >90 mL/min Final  . GFR calc Af Amer 07/27/2013 65* >90 mL/min Final   Comment: (NOTE)                          The eGFR has been calculated using the CKD EPI equation.                          This calculation has not been validated in all clinical situations.                          eGFR's persistently <90 mL/min signify possible Chronic Kidney                          Disease.  . Ferritin 07/27/2013 221  22 - 322 ng/mL Final   Performed at [facility]:   FINAL for LEIBY, PIGEON ([id number]) Patient: IKECHUKWU, CERNY Collected: 01/22/2013 Client: [facility] Accession: [id number] Received: 01/22/2013 Markus Daft DOB: January 26, 1946 Age: 51 Gender: M Reported: 01/24/2013 [address] Patient Ph: [phone] MRN #: [id number] [place], [place] [address] Visit #: [id number] Chart #: Phone: [phone] Fax: CC: Farrel Gobble, MD FLOW CYTOMETRY REPORT INTERPRETATION Interpretation Bone Marrow Flow Cytometry - PREDOMINANCE OF T-LYMPHOCYTES WITH NONSPECIFIC REVERSAL OF THE CD4:CD8 RATIO. - NO SIGNIFICANT B-CELL POPULATION IDENTIFIED. Susanne Greenhouse MD Pathologist, Electronic Signature (Case signed 01/24/2013) GROSS AND MICROSCOPIC INFORMATION Source Bone Marrow Flow Cytometry Microscopic Gated population: Flow cytometric immunophenotyping is performed using antibiodies to the antigens listed in the table below. Electronic gates are placed around a cell cluster displaying light scatter properties corresponding to lymphocytes. - Abnormal Cells in gated population: NA - Phenotype of Abnormal Cells: NA 1 of 2 FINAL for FREEDOM, PEDDY ([id number]) Specimen Table Lymphoid Associated Myeloid Associated Misc. As CD2 tested CD19 tested CD11c ND CD45 tested CD3 tested CD20 tested CD13 ND HLA-DR tested CD4 tested CD21 tested CD14 ND CD10 tested CD5 tested CD22 tested CD15 ND CD56/16 ND CD7 tested CD23 tested CD33 ND ZAP70 ND CD8 tested CD103 ND MPO ND  CD34 ND CD25 ND FMC7 ND CD117 ND CD52 ND sKappa tested CD38 ND sLambda tested cKappa ND cLambda ND Gross ref. [id number] All controls stained appropriately. The above tests were developed and their performance characteristics determined by the [facility]. They have not been cleared or approved by the U.S. Food and Drug Administration." Report signed from the following location(s) Interpretation performed at [place], [place],  [id number]. CLIA    Urinalysis    Component Value Date/Time   COLORURINE YELLOW 02/28/2012 0116   APPEARANCEUR CLEAR 02/28/2012 0116   LABSPEC 1.020 02/28/2012 0116   PHURINE 5.5 02/28/2012 0116   GLUCOSEU NEGATIVE 02/28/2012 0116   HGBUR NEGATIVE 02/28/2012 0116   BILIRUBINUR NEGATIVE 02/28/2012 0116   KETONESUR NEGATIVE 02/28/2012 0116   PROTEINUR NEGATIVE 02/28/2012 0116   UROBILINOGEN 0.2 02/28/2012 0116   NITRITE NEGATIVE 02/28/2012 0116  LEUKOCYTESUR NEGATIVE 02/28/2012 0116    RADIOGRAPHIC STUDIES: No results found.  ASSESSMENT:  #1. Chronic lymphocytic leukemia with minimal residual disease on bone marrow aspiration, in no need of therapy.  #2. Aphthous ulcer right buccal mucosa, result.  #3. Right inguinal hernia, easily reducible, minimally symptomatic.  #4. History of Hodgkin's disease, nodular sclerosis type, no evidence of disease.  #5. 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 Prescription for inguinal hernia belt. #2. Patient did see a surgeon who felt his comorbidity worked against repairing his hernia. #3. Continue watchful expectation with followup in 2 months at which time CBC, chem profile, beta 2 microglobulin, LDH, and reticulocyte count will be done. He was told to call should he develop fevers that antibiotics could be quickly started.   All questions were answered. The patient knows to call the clinic with any problems, questions or  concerns. We can certainly see the patient much sooner if necessary.   I spent 25 minutes counseling the patient face to face. The total time spent in the appointment was 30 minutes.    Farrel Gobble, MD 07/30/2013 10:30 AM  DISCLAIMER:  This note was dictated with voice recognition software.  Similar sounding words can inadvertently be transcribed inaccurately and may not be corrected upon review.

## 2013-07-27 NOTE — Progress Notes (Signed)
Malcom W Grossi presented for Portacath access and flush. Proper placement of portacath confirmed by CXR. Portacath located right chest wall accessed with  H 20 needle. Good blood return present. Portacath flushed with 20ml NS and 500U/5ml Heparin and needle removed intact. Procedure without incident. Patient tolerated procedure well.   

## 2013-07-27 NOTE — Patient Instructions (Signed)
[facility] Discharge Instructions  RECOMMENDATIONS MADE BY THE CONSULTANT AND ANY TEST RESULTS WILL BE SENT TO YOUR REFERRING PHYSICIAN.  EXAM FINDINGS BY THE PHYSICIAN TODAY AND SIGNS OR SYMPTOMS TO REPORT TO CLINIC OR PRIMARY PHYSICIAN: Exam and findings as discussed by Dr. Barnet Glasgow.  Will give you a prescription for a hernia belt.  Report fevers, night sweats, unexplained weight loss, etc.  MEDICATIONS PRESCRIBED:  none  INSTRUCTIONS/FOLLOW-UP: Port flush in 6 week and office visit with port flush in 2 months.  Thank you for choosing [facility] to provide your oncology and hematology care.  To afford each patient quality time with our providers, please arrive at least 15 minutes before your scheduled appointment time.  With your help, our goal is to use those 15 minutes to complete the necessary work-up to ensure our physicians have the information they need to help with your evaluation and healthcare recommendations.    Effective January 1st, 2014, we ask that you re-schedule your appointment with our physicians should you arrive 10 or more minutes late for your appointment.  We strive to give you quality time with our providers, and arriving late affects you and other patients whose appointments are after yours.    Again, thank you for choosing [facility].  Our hope is that these requests will decrease the amount of time that you wait before being seen by our physicians.       _____________________________________________________________  Should you have questions after your visit to [facility], please contact our office at [phone] between the hours of 8:30 a.m. and 5:00 p.m.  Voicemails left after 4:30 p.m. will not be returned until the following business day.  For prescription refill requests, have your pharmacy contact our office with your prescription refill request.

## 2013-07-30 LAB — BETA 2 MICROGLOBULIN, SERUM: Beta-2 Microglobulin: 7.39 mg/L — ABNORMAL HIGH (ref <=2.51)

## 2013-07-30 NOTE — Addendum Note (Signed)
Addended by: Gerhard Perches on: 07/30/2013 03:30 PM   Modules accepted: Orders

## 2013-07-31 ENCOUNTER — Encounter (HOSPITAL_COMMUNITY): Payer: Medicare Other

## 2013-07-31 DIAGNOSIS — D801 Nonfamilial hypogammaglobulinemia: Secondary | ICD-10-CM

## 2013-07-31 DIAGNOSIS — C9110 Chronic lymphocytic leukemia of B-cell type not having achieved remission: Secondary | ICD-10-CM

## 2013-07-31 DIAGNOSIS — Z954 Presence of other heart-valve replacement: Secondary | ICD-10-CM

## 2013-07-31 DIAGNOSIS — D61818 Other pancytopenia: Secondary | ICD-10-CM

## 2013-07-31 DIAGNOSIS — I4891 Unspecified atrial fibrillation: Secondary | ICD-10-CM

## 2013-07-31 DIAGNOSIS — C8190 Hodgkin lymphoma, unspecified, unspecified site: Secondary | ICD-10-CM

## 2013-07-31 DIAGNOSIS — K4090 Unilateral inguinal hernia, without obstruction or gangrene, not specified as recurrent: Secondary | ICD-10-CM

## 2013-07-31 LAB — CBC WITH DIFFERENTIAL/PLATELET
BASOS ABS: 0.1 10*3/uL (ref 0.0–0.1)
Basophils Relative: 2 % — ABNORMAL HIGH (ref 0–1)
EOS PCT: 20 % — AB (ref 0–5)
Eosinophils Absolute: 0.5 10*3/uL (ref 0.0–0.7)
HEMATOCRIT: 34 % — AB (ref 39.0–52.0)
Hemoglobin: 11.6 g/dL — ABNORMAL LOW (ref 13.0–17.0)
LYMPHS ABS: 1 10*3/uL (ref 0.7–4.0)
LYMPHS PCT: 39 % (ref 12–46)
MCH: 33 pg (ref 26.0–34.0)
MCHC: 34.1 g/dL (ref 30.0–36.0)
MCV: 96.6 fL (ref 78.0–100.0)
MONO ABS: 0.5 10*3/uL (ref 0.1–1.0)
Monocytes Relative: 18 % — ABNORMAL HIGH (ref 3–12)
NEUTROS ABS: 0.6 10*3/uL — AB (ref 1.7–7.7)
Neutrophils Relative %: 22 % — ABNORMAL LOW (ref 43–77)
Platelets: 129 10*3/uL — ABNORMAL LOW (ref 150–400)
RBC: 3.52 MIL/uL — ABNORMAL LOW (ref 4.22–5.81)
RDW: 15.5 % (ref 11.5–15.5)
WBC: 2.6 10*3/uL — AB (ref 4.0–10.5)

## 2013-08-10 ENCOUNTER — Other Ambulatory Visit (HOSPITAL_COMMUNITY): Payer: Self-pay | Admitting: Oncology

## 2013-08-10 DIAGNOSIS — D801 Nonfamilial hypogammaglobulinemia: Secondary | ICD-10-CM

## 2013-08-10 DIAGNOSIS — K4090 Unilateral inguinal hernia, without obstruction or gangrene, not specified as recurrent: Secondary | ICD-10-CM

## 2013-08-10 DIAGNOSIS — I4891 Unspecified atrial fibrillation: Secondary | ICD-10-CM

## 2013-08-10 DIAGNOSIS — Z954 Presence of other heart-valve replacement: Secondary | ICD-10-CM

## 2013-08-10 DIAGNOSIS — C9110 Chronic lymphocytic leukemia of B-cell type not having achieved remission: Secondary | ICD-10-CM

## 2013-08-10 DIAGNOSIS — D61818 Other pancytopenia: Secondary | ICD-10-CM

## 2013-08-10 DIAGNOSIS — C8190 Hodgkin lymphoma, unspecified, unspecified site: Secondary | ICD-10-CM

## 2013-08-10 MED ORDER — ACYCLOVIR 200 MG PO CAPS: 200.0000 mg | ORAL_CAPSULE | Freq: Two times a day (BID) | ORAL | Status: DC

## 2013-08-17 ENCOUNTER — Ambulatory Visit (INDEPENDENT_AMBULATORY_CARE_PROVIDER_SITE_OTHER): Payer: Medicare Other | Admitting: Family Medicine

## 2013-08-17 ENCOUNTER — Encounter: Payer: Self-pay | Admitting: Family Medicine

## 2013-08-17 VITALS — BP 100/60 | HR 72 | Resp 16 | Wt 218.0 lb

## 2013-08-17 DIAGNOSIS — K0500 Acute gingivitis, plaque induced: Secondary | ICD-10-CM

## 2013-08-17 DIAGNOSIS — E785 Hyperlipidemia, unspecified: Secondary | ICD-10-CM

## 2013-08-17 DIAGNOSIS — I10 Essential (primary) hypertension: Secondary | ICD-10-CM

## 2013-08-17 DIAGNOSIS — R7303 Prediabetes: Secondary | ICD-10-CM

## 2013-08-17 DIAGNOSIS — R7309 Other abnormal glucose: Secondary | ICD-10-CM

## 2013-08-17 MED ORDER — CEPHALEXIN 500 MG PO CAPS: 500.0000 mg | ORAL_CAPSULE | Freq: Three times a day (TID) | ORAL | Status: DC

## 2013-08-17 NOTE — Patient Instructions (Addendum)
F/u as before   You are treated today for gum infection , keflex is sent in, do not take septra while on this  Fasting lipid, cmp and HBa1C as son as possible

## 2013-08-18 NOTE — Assessment & Plan Note (Signed)
Updated lab needed at/ before next visit.   

## 2013-08-18 NOTE — Assessment & Plan Note (Signed)
Updated lab needed at/ before next visit. Currently not taking any medication regularly

## 2013-08-18 NOTE — Assessment & Plan Note (Signed)
5 day course of keflex prescribed, pt to hold septra while taking this

## 2013-08-18 NOTE — Progress Notes (Signed)
   Subjective:    Patient ID: [name], male    DOB: 1945-09-17, 68 y.o.   MRN: [id number]  HPI 3 day h/o pain and swelling of upper gums, no trauma or bleeding noted in the area. Tender to touch, denies fever , chills, or nasal drainage or direct sinus pressure   Review of Systems See HPI Denies recent fever or chills. Denies sinus pressure, nasal congestion, ear pain or sore throat. Denies chest congestion, productive cough or wheezing. Denies chest pains, palpitations and leg swelling Denies abdominal pain, nausea, vomiting,diarrhea or constipation.    Denies skin break down or rash.        Objective:   Physical Exam BP 100/60  Pulse 72  Resp 16  Wt 218 lb (98.884 kg)  SpO2 100% Patient alert and oriented and in no cardiopulmonary distress.  HEENT: No facial asymmetry, EOMI, no sinus tenderness,  oropharynx pink and moist.  Neck supple no adenopathy.Tender to palpation over upper gum in area of the 2 front teeth, which are missing. Gum in area erythematous and mildly swollen also  Chest: Clear to auscultation bilaterally.  CVS: S1, S2 systolic murmur, no S3.  ABD: Soft non tender.   Ext: No edema  Skin: Intact, no ulcerations or rash noted.  Psych: Good eye contact, normal affect. Memory intact not anxious or depressed appearing.  CNS: CN 2-12 intact, power,  normal throughout.        Assessment & Plan:  Gingivitis, acute 5 day course of keflex prescribed, pt to hold septra while taking this  HYPERLIPIDEMIA Updated lab needed at/ before next visit. Currently not taking any medication regularly  Pre-diabetes Updated lab needed at/ before next visit.

## 2013-08-21 LAB — LIPID PANEL
CHOLESTEROL: 315 mg/dL — AB (ref 0–200)
HDL: 26 mg/dL — ABNORMAL LOW (ref >39)
LDL Cholesterol: 249 mg/dL — ABNORMAL HIGH (ref 0–99)
TRIGLYCERIDES: 202 mg/dL — AB (ref <150)
Total CHOL/HDL Ratio: 12.1 Ratio
VLDL: 40 mg/dL (ref 0–40)

## 2013-08-21 LAB — COMPREHENSIVE METABOLIC PANEL
ALBUMIN: 4.1 g/dL (ref 3.5–5.2)
ALT: 14 U/L (ref 0–53)
AST: 18 U/L (ref 0–37)
Alkaline Phosphatase: 89 U/L (ref 39–117)
BUN: 13 mg/dL (ref 6–23)
CALCIUM: 8.8 mg/dL (ref 8.4–10.5)
CO2: 27 meq/L (ref 19–32)
Chloride: 103 mEq/L (ref 96–112)
Creat: 1.4 mg/dL — ABNORMAL HIGH (ref 0.50–1.35)
Glucose, Bld: 109 mg/dL — ABNORMAL HIGH (ref 70–99)
Potassium: 4.4 mEq/L (ref 3.5–5.3)
SODIUM: 139 meq/L (ref 135–145)
TOTAL PROTEIN: 5.7 g/dL — AB (ref 6.0–8.3)
Total Bilirubin: 0.5 mg/dL (ref 0.2–1.2)

## 2013-08-21 LAB — HEMOGLOBIN A1C
Hgb A1c MFr Bld: 5.4 % (ref <5.7)
Mean Plasma Glucose: 108 mg/dL (ref <117)

## 2013-08-22 ENCOUNTER — Ambulatory Visit (INDEPENDENT_AMBULATORY_CARE_PROVIDER_SITE_OTHER): Payer: Medicare Other | Admitting: Family Medicine

## 2013-08-22 ENCOUNTER — Encounter: Payer: Self-pay | Admitting: Family Medicine

## 2013-08-22 ENCOUNTER — Ambulatory Visit: Payer: Medicare Other | Admitting: Family Medicine

## 2013-08-22 VITALS — BP 112/74 | HR 68 | Resp 16 | Wt 212.4 lb

## 2013-08-22 DIAGNOSIS — K121 Other forms of stomatitis: Secondary | ICD-10-CM

## 2013-08-22 DIAGNOSIS — I10 Essential (primary) hypertension: Secondary | ICD-10-CM

## 2013-08-22 DIAGNOSIS — E785 Hyperlipidemia, unspecified: Secondary | ICD-10-CM

## 2013-08-22 DIAGNOSIS — K1370 Unspecified lesions of oral mucosa: Secondary | ICD-10-CM

## 2013-08-22 MED ORDER — ATORVASTATIN CALCIUM 40 MG PO TABS: 40.0000 mg | ORAL_TABLET | Freq: Every day | ORAL | Status: DC

## 2013-08-22 MED ORDER — CLINDAMYCIN HCL 300 MG PO CAPS: 300.0000 mg | ORAL_CAPSULE | Freq: Three times a day (TID) | ORAL | Status: DC

## 2013-08-22 NOTE — Patient Instructions (Signed)
F/u in 3 month, call if you need me before  New for ulcer in the mouth is cleocin take as directed and hold bactrim  You are also referred to Dr Molinda Bailiff for cholesterol is lipitor take at night  Rinse mouth with salt water and use something like oragel for pain relief

## 2013-08-23 ENCOUNTER — Ambulatory Visit (INDEPENDENT_AMBULATORY_CARE_PROVIDER_SITE_OTHER): Payer: Medicare Other | Admitting: Otolaryngology

## 2013-08-23 DIAGNOSIS — K120 Recurrent oral aphthae: Secondary | ICD-10-CM

## 2013-08-24 NOTE — Progress Notes (Signed)
Labs drawn

## 2013-08-28 ENCOUNTER — Emergency Department (HOSPITAL_COMMUNITY)
Admission: EM | Admit: 2013-08-28 | Discharge: 2013-08-28 | Disposition: A | Discharge status: Home / Self Care | Payer: Medicare Other | Attending: Emergency Medicine | Admitting: Emergency Medicine

## 2013-08-28 ENCOUNTER — Encounter (HOSPITAL_COMMUNITY): Payer: Self-pay | Admitting: Emergency Medicine

## 2013-08-28 ENCOUNTER — Emergency Department (HOSPITAL_COMMUNITY): Payer: Medicare Other

## 2013-08-28 DIAGNOSIS — Z7982 Long term (current) use of aspirin: Secondary | ICD-10-CM | POA: Insufficient documentation

## 2013-08-28 DIAGNOSIS — Z951 Presence of aortocoronary bypass graft: Secondary | ICD-10-CM | POA: Insufficient documentation

## 2013-08-28 DIAGNOSIS — Z792 Long term (current) use of antibiotics: Secondary | ICD-10-CM | POA: Insufficient documentation

## 2013-08-28 DIAGNOSIS — Z87448 Personal history of other diseases of urinary system: Secondary | ICD-10-CM | POA: Insufficient documentation

## 2013-08-28 DIAGNOSIS — E785 Hyperlipidemia, unspecified: Secondary | ICD-10-CM | POA: Insufficient documentation

## 2013-08-28 DIAGNOSIS — R531 Weakness: Secondary | ICD-10-CM

## 2013-08-28 DIAGNOSIS — I10 Essential (primary) hypertension: Secondary | ICD-10-CM | POA: Insufficient documentation

## 2013-08-28 DIAGNOSIS — I2510 Atherosclerotic heart disease of native coronary artery without angina pectoris: Secondary | ICD-10-CM | POA: Insufficient documentation

## 2013-08-28 DIAGNOSIS — M129 Arthropathy, unspecified: Secondary | ICD-10-CM | POA: Insufficient documentation

## 2013-08-28 DIAGNOSIS — Z87442 Personal history of urinary calculi: Secondary | ICD-10-CM | POA: Insufficient documentation

## 2013-08-28 DIAGNOSIS — C9110 Chronic lymphocytic leukemia of B-cell type not having achieved remission: Secondary | ICD-10-CM | POA: Insufficient documentation

## 2013-08-28 DIAGNOSIS — H9190 Unspecified hearing loss, unspecified ear: Secondary | ICD-10-CM | POA: Insufficient documentation

## 2013-08-28 DIAGNOSIS — R011 Cardiac murmur, unspecified: Secondary | ICD-10-CM | POA: Insufficient documentation

## 2013-08-28 DIAGNOSIS — K219 Gastro-esophageal reflux disease without esophagitis: Secondary | ICD-10-CM | POA: Insufficient documentation

## 2013-08-28 DIAGNOSIS — Z79899 Other long term (current) drug therapy: Secondary | ICD-10-CM | POA: Insufficient documentation

## 2013-08-28 DIAGNOSIS — M6281 Muscle weakness (generalized): Secondary | ICD-10-CM | POA: Insufficient documentation

## 2013-08-28 DIAGNOSIS — R509 Fever, unspecified: Secondary | ICD-10-CM | POA: Insufficient documentation

## 2013-08-28 DIAGNOSIS — R630 Anorexia: Secondary | ICD-10-CM | POA: Insufficient documentation

## 2013-08-28 LAB — CBC WITH DIFFERENTIAL/PLATELET
BASOS PCT: 1 % (ref 0–1)
Basophils Absolute: 0 10*3/uL (ref 0.0–0.1)
EOS ABS: 0.2 10*3/uL (ref 0.0–0.7)
EOS PCT: 11 % — AB (ref 0–5)
HCT: 31.8 % — ABNORMAL LOW (ref 39.0–52.0)
HEMOGLOBIN: 11 g/dL — AB (ref 13.0–17.0)
Lymphocytes Relative: 37 % (ref 12–46)
Lymphs Abs: 0.7 10*3/uL (ref 0.7–4.0)
MCH: 32.2 pg (ref 26.0–34.0)
MCHC: 34.6 g/dL (ref 30.0–36.0)
MCV: 93 fL (ref 78.0–100.0)
MONOS PCT: 20 % — AB (ref 3–12)
Monocytes Absolute: 0.4 10*3/uL (ref 0.1–1.0)
NEUTROS PCT: 31 % — AB (ref 43–77)
Neutro Abs: 0.6 10*3/uL — ABNORMAL LOW (ref 1.7–7.7)
Platelets: 102 10*3/uL — ABNORMAL LOW (ref 150–400)
RBC: 3.42 MIL/uL — ABNORMAL LOW (ref 4.22–5.81)
RDW: 14.2 % (ref 11.5–15.5)
WBC: 1.9 10*3/uL — ABNORMAL LOW (ref 4.0–10.5)

## 2013-08-28 LAB — COMPREHENSIVE METABOLIC PANEL
ALBUMIN: 3.1 g/dL — AB (ref 3.5–5.2)
ALK PHOS: 97 U/L (ref 39–117)
ALT: 21 U/L (ref 0–53)
AST: 26 U/L (ref 0–37)
BUN: 12 mg/dL (ref 6–23)
CALCIUM: 8.6 mg/dL (ref 8.4–10.5)
CO2: 22 mEq/L (ref 19–32)
CREATININE: 1.38 mg/dL — AB (ref 0.50–1.35)
Chloride: 99 mEq/L (ref 96–112)
GFR calc non Af Amer: 51 mL/min — ABNORMAL LOW (ref >90)
GFR, EST AFRICAN AMERICAN: 59 mL/min — AB (ref >90)
Glucose, Bld: 114 mg/dL — ABNORMAL HIGH (ref 70–99)
POTASSIUM: 3.5 meq/L — AB (ref 3.7–5.3)
Sodium: 136 mEq/L — ABNORMAL LOW (ref 137–147)
TOTAL PROTEIN: 5.6 g/dL — AB (ref 6.0–8.3)
Total Bilirubin: 0.5 mg/dL (ref 0.3–1.2)

## 2013-08-28 LAB — URINALYSIS, ROUTINE W REFLEX MICROSCOPIC
Bilirubin Urine: NEGATIVE
GLUCOSE, UA: NEGATIVE mg/dL
Hgb urine dipstick: NEGATIVE
Ketones, ur: NEGATIVE mg/dL
Leukocytes, UA: NEGATIVE
Nitrite: NEGATIVE
PH: 5.5 (ref 5.0–8.0)
Protein, ur: NEGATIVE mg/dL
Urobilinogen, UA: 0.2 mg/dL (ref 0.0–1.0)

## 2013-08-28 NOTE — ED Notes (Signed)
Pt c/o fever and generalized weakness that began last night. Pt is currently being tx for leukemia.

## 2013-08-28 NOTE — ED Notes (Signed)
Pt updated on plan of care, family at bedside,

## 2013-08-28 NOTE — Discharge Instructions (Signed)
Weakness  Weakness is a lack of strength. It may be felt all over the body (generalized) or in one specific part of the body (focal). Some causes of weakness can be serious. You may need further medical evaluation, especially if you are elderly or you have a history of immunosuppression (such as chemotherapy or HIV), kidney disease, heart disease, or diabetes.  CAUSES   Weakness can be caused by many different things, including:  · Infection.  · Physical exhaustion.  · Internal bleeding or other blood loss that results in a lack of red blood cells (anemia).  · Dehydration. This cause is more common in elderly people.  · Side effects or electrolyte abnormalities from medicines, such as pain medicines or sedatives.  · Emotional distress, anxiety, or depression.  · Circulation problems, especially severe peripheral arterial disease.  · Heart disease, such as rapid atrial fibrillation, bradycardia, or heart failure.  · Nervous system disorders, such as Guillain-Barré syndrome, multiple sclerosis, or stroke.  DIAGNOSIS   To find the cause of your weakness, your caregiver will take your history and perform a physical exam. Lab tests or X-rays may also be ordered, if needed.  TREATMENT   Treatment of weakness depends on the cause of your symptoms and can vary greatly.  HOME CARE INSTRUCTIONS   · Rest as needed.  · Eat a well-balanced diet.  · Try to get some exercise every day.  · Only take over-the-counter or prescription medicines as directed by your caregiver.  SEEK MEDICAL CARE IF:   · Your weakness seems to be getting worse or spreads to other parts of your body.  · You develop new aches or pains.  SEEK IMMEDIATE MEDICAL CARE IF:   · You cannot perform your normal daily activities, such as getting dressed and feeding yourself.  · You cannot walk up and down stairs, or you feel exhausted when you do so.  · You have shortness of breath or chest pain.  · You have difficulty moving parts of your body.  · You have weakness  in only one area of the body or on only one side of the body.  · You have a fever.  · You have trouble speaking or swallowing.  · You cannot control your bladder or bowel movements.  · You have black or bloody vomit or stools.  MAKE SURE YOU:  · Understand these instructions.  · Will watch your condition.  · Will get help right away if you are not doing well or get worse.  Document Released: 03/15/2005 Document Revised: 09/14/2011 Document Reviewed: 05/14/2011  [employer]® Patient Information ©2014 [employer].

## 2013-08-28 NOTE — ED Notes (Signed)
Lab at bedside to draw blood.

## 2013-08-28 NOTE — ED Provider Notes (Signed)
CSN: [id number]     Arrival date & time 08/28/13  1157 History   First MD Initiated Contact with Patient 08/28/13 1205     Chief Complaint  Patient presents with  . Fever  . Weakness     (Consider location/radiation/quality/duration/timing/severity/associated sxs/prior Treatment) Patient is a 68 y.o. male presenting with fever and weakness. The history is provided by the patient.  Fever Associated symptoms: no chest pain, no diarrhea, no headaches, no nausea, no rash and no vomiting   Weakness Pertinent negatives include no chest pain, no abdominal pain, no headaches and no shortness of breath.   patient states he has had some generalized weakness. States he feels little bad and generally weak. States had fever up to 101 at home. He's had some mild shortness of breath, but states is not much worse than normal. No sore throat. He has a somewhat chronic cough. He has some chronic urinary frequency. No dysuria. No swelling of his legs. No rash. No chest pain. He has a history of CLL. he has been on antibiotics for infection of his gums. No diarrhea.  Past Medical History  Diagnosis Date  . Aortic stenosis   . Atrial fibrillation   . Coronary atherosclerosis of native coronary artery   . Hyperlipidemia   . Essential hypertension, benign   . BPH (benign prostatic hypertrophy)   . Elevated WBC count   . Arthritis   . Acid reflux   . Hypogammaglobulinemia   . Pre-diabetes 08/04/2011  . CLL (chronic lymphoblastic leukemia)   . Hodgkin's disease   . Kidney stones   . Wears glasses   . Wears dentures   . HOH (hard of hearing)   . Indirect inguinal hernia 04/13/2013   Past Surgical History  Procedure Laterality Date  . Eye surgery    . Aortic valve replacement  10/09    36mm Magna Pericardial   . Esophagogastroduodenoscopy      scrapping of throat and stretching  . Lithotripsy    . Bone marrow aspiration    . Portacath placement    . Coronary artery bypass graft  10/09    SVG to  RCA  . Colon surgery    . Dental surgery  08/2012  . Multiple tooth extractions    . Mass excision N/A 12/05/2012    Procedure: EXCISION MIDLINE NECK MASS;  Surgeon: Ascencion Dike, MD;  Location: [facility];  Service: ENT;  Laterality: N/A;  . Tongue biopsy  12/2012   Family History  Problem Relation Age of Onset  . Heart failure Mother   . Blindness Mother   . Cancer Father     Lung    History  Substance Use Topics  . Smoking status: Never Smoker   . Smokeless tobacco: Never Used     Comment: pt denies tobacco use   . Alcohol Use: No     Comment: pt denies alcohol     Review of Systems  Constitutional: Positive for fever, appetite change and fatigue. Negative for activity change.  Eyes: Negative for pain.  Respiratory: Negative for chest tightness and shortness of breath.   Cardiovascular: Negative for chest pain and leg swelling.  Gastrointestinal: Negative for nausea, vomiting, abdominal pain and diarrhea.  Genitourinary: Negative for flank pain.  Musculoskeletal: Negative for back pain and neck stiffness.  Skin: Negative for rash.  Neurological: Positive for weakness. Negative for numbness and headaches.  Psychiatric/Behavioral: Negative for behavioral problems.      Allergies  Review  of patient's allergies indicates no known allergies.  Home Medications   Prior to Admission medications   Medication Sig Start Date End Date Taking? Authorizing Provider  acetaminophen (TYLENOL) 500 MG tablet Take 1,000 mg by mouth every 6 (six) hours as needed for fever.    Yes Historical Provider, MD  acyclovir (ZOVIRAX) 200 MG capsule Take 1 capsule (200 mg total) by mouth 2 (two) times daily. 08/10/13  Yes Baird Cancer, PA-C  aspirin EC 81 MG tablet Take 81 mg by mouth daily.   Yes Historical Provider, MD  folic acid (FOLVITE) 1 MG tablet Take 1 tablet (1 mg total) by mouth daily. 06/05/13  Yes Manon Hilding Kefalas, PA-C  metoprolol (LOPRESSOR) 50 MG tablet Take 1 tablet  (50 mg total) by mouth 2 (two) times daily. 02/14/13  Yes Satira Sark, MD  Multiple Vitamins-Minerals (CENTRUM SILVER ULTRA MENS PO) Take 1 tablet by mouth every morning.    Yes Historical Provider, MD  OMEGA 3 1000 MG CAPS Take 1,000 mg by mouth daily.    Yes Historical Provider, MD  omeprazole (PRILOSEC) 20 MG capsule Take 20 mg by mouth every morning.    Yes Historical Provider, MD  sulfamethoxazole-trimethoprim (BACTRIM DS) 800-160 MG per tablet Take 1 tablet by mouth. Takes Mondays, Wednesdays and Fridays 06/04/13  Yes Historical Provider, MD  atorvastatin (LIPITOR) 40 MG tablet Take 1 tablet (40 mg total) by mouth daily. 08/22/13   [name], MD  clindamycin (CLEOCIN) 300 MG capsule Take 1 capsule (300 mg total) by mouth 3 (three) times daily. 08/22/13   [name], MD   BP 98/49  Pulse 76  Temp(Src) 99.9 F (37.7 C) (Oral)  Resp 16  SpO2 100% Physical Exam  Nursing note and vitals reviewed. Constitutional: He is oriented to person, place, and time. He appears well-developed and well-nourished.  HENT:  Head: Normocephalic and atraumatic.  Eyes: EOM are normal. Pupils are equal, round, and reactive to light.  Neck: Normal range of motion. Neck supple.  Cardiovascular: Normal rate and regular rhythm.   Murmur heard. Slight murmur. Valve click  Pulmonary/Chest: Effort normal and breath sounds normal.  Abdominal: Soft. Bowel sounds are normal. He exhibits no distension and no mass. There is no tenderness. There is no rebound and no guarding.  Musculoskeletal: Normal range of motion. He exhibits no edema.  Neurological: He is alert and oriented to person, place, and time. No cranial nerve deficit.  Skin: Skin is warm and dry.  Psychiatric: He has a normal mood and affect.    ED Course  Procedures (including critical care time) Labs Review Labs Reviewed  CBC WITH DIFFERENTIAL - Abnormal; Notable for the following:    WBC 1.9 (*)    RBC 3.42 (*)    Hemoglobin  11.0 (*)    HCT 31.8 (*)    Platelets 102 (*)    Neutrophils Relative % 31 (*)    Neutro Abs 0.6 (*)    Monocytes Relative 20 (*)    Eosinophils Relative 11 (*)    All other components within normal limits  COMPREHENSIVE METABOLIC PANEL - Abnormal; Notable for the following:    Sodium 136 (*)    Potassium 3.5 (*)    Glucose, Bld 114 (*)    Creatinine, Ser 1.38 (*)    Total Protein 5.6 (*)    Albumin 3.1 (*)    GFR calc non Af Amer 51 (*)    GFR calc Af Amer 59 (*)  All other components within normal limits  URINALYSIS, ROUTINE W REFLEX MICROSCOPIC - Abnormal; Notable for the following:    Specific Gravity, Urine >1.030 (*)    All other components within normal limits  CULTURE, BLOOD (ROUTINE X 2)  CULTURE, BLOOD (ROUTINE X 2)    Imaging Review Dg Chest 2 View  08/28/2013   CLINICAL DATA:  Fever and generalized weakness. History of leukemia.  EXAM: CHEST  2 VIEW  COMPARISON:  09/21/2012.  FINDINGS: Median sternotomy for prior aortic valve repair. Lateral view degraded by patient arm position. A right-sided Port-A-Cath terminates at the low SVC, similar. Midline trachea. Normal heart size, with atherosclerosis in the transverse aorta. No pleural effusion or pneumothorax. Clear lungs.  IMPRESSION: No acute cardiopulmonary disease.   Electronically Signed   By: Abigail Miyamoto M.D.   On: 08/28/2013 13:56     EKG Interpretation None      MDM   Final diagnoses:  Weakness  CLL (chronic lymphocytic leukemia)    Patient with generalized weakness and reported fever at home. No fever here. Lab work appears to be at baseline. He has had a little bit of cough but no real other reason for fever. His gums appear to be improving compared to previous notes. He does have a valve and blood cultures been sent. No new murmur. Will discharge home follow up with PCP and his oncologist    Jasper Riling. Alvino Chapel, MD 08/28/13 [phone]

## 2013-08-28 NOTE — ED Notes (Signed)
Pt states that he was seen by Dr Moshe Cipro on 08/17/2013 for sore gums, was placed on keflex, felt that he was not getting any better, contacted Dr Moshe Cipro on 08/22/2013 and placed on cleocin 300 mg tid and lipitor 40 mg once a day. Started running fever, generalized weakness yesterday, pt also reports that with each time he took cleocin he would have an episode of chest tightness.

## 2013-08-29 ENCOUNTER — Other Ambulatory Visit: Payer: Self-pay

## 2013-08-29 ENCOUNTER — Telehealth: Payer: Self-pay | Admitting: Family Medicine

## 2013-08-29 MED ORDER — SULFAMETHOXAZOLE-TMP DS 800-160 MG PO TABS: 1.0000 | ORAL_TABLET | Freq: Two times a day (BID) | ORAL | Status: DC

## 2013-08-29 NOTE — Telephone Encounter (Signed)
Please call patient back had shakes yesterday tired and weak today

## 2013-08-29 NOTE — Telephone Encounter (Signed)
Called patient and left message for them to return call at the office   

## 2013-08-29 NOTE — Telephone Encounter (Signed)
Patient aware - has appt with ENT tomorrow. Med sent

## 2013-08-29 NOTE — Telephone Encounter (Signed)
pls erx septra DS one twice daily for 5 1 week, he generally take s this 3 times weekly. After he finishes that he can resume original 3 times weekly schedule. He aLSO NEEDS TO HAVE THE ent dOC EXAMINE AND TREAT THE PROBLEM IN HIS MOUTH, PLS ENSURE HE HAS AND KNOWS WHEN HIS ent/ ORAL APPT IS (i SEE A NAME sTRADER IN REFERRAL INFO)

## 2013-08-29 NOTE — Telephone Encounter (Signed)
Went to ED after having chest tightness he thinks is from the antibiotic you gave him. Stopped taking the antibiotic Monday night. Hasn't had any problems other than indigestion since stopping and that has been controlled with the prilosec.States the antibiotic was making him have chest tightness although the ER couldn't find anything wrong, wants another antibiotic sent in

## 2013-08-30 ENCOUNTER — Ambulatory Visit (INDEPENDENT_AMBULATORY_CARE_PROVIDER_SITE_OTHER): Payer: Medicare Other | Admitting: Otolaryngology

## 2013-08-30 DIAGNOSIS — K120 Recurrent oral aphthae: Secondary | ICD-10-CM

## 2013-09-02 LAB — CULTURE, BLOOD (ROUTINE X 2)
CULTURE: NO GROWTH
Culture: NO GROWTH

## 2013-09-06 ENCOUNTER — Encounter (HOSPITAL_COMMUNITY): Payer: Medicare Other | Attending: Oncology

## 2013-09-06 DIAGNOSIS — Z452 Encounter for adjustment and management of vascular access device: Secondary | ICD-10-CM

## 2013-09-06 DIAGNOSIS — C9110 Chronic lymphocytic leukemia of B-cell type not having achieved remission: Secondary | ICD-10-CM | POA: Insufficient documentation

## 2013-09-06 MED ORDER — SODIUM CHLORIDE 0.9 % IJ SOLN
10.0000 mL | INTRAMUSCULAR | Status: DC | PRN
  Administered 2013-09-06: 10 mL via INTRAVENOUS

## 2013-09-06 MED ORDER — HEPARIN SOD (PORK) LOCK FLUSH 100 UNIT/ML IV SOLN
500.0000 [IU] | Freq: Once | INTRAVENOUS | Status: AC
  Administered 2013-09-06: 500 [IU] via INTRAVENOUS
  Filled 2013-09-06: qty 5

## 2013-09-06 NOTE — Progress Notes (Signed)
[name] presented for Portacath access and flush. Portacath located rt chest wall accessed with  H 20 needle. Good blood return present. Portacath flushed with 61ml NS and 500U/66ml Heparin and needle removed intact. Procedure without incident. Patient tolerated procedure well.

## 2013-09-13 ENCOUNTER — Ambulatory Visit (INDEPENDENT_AMBULATORY_CARE_PROVIDER_SITE_OTHER): Payer: Medicare Other | Admitting: Otolaryngology

## 2013-09-13 DIAGNOSIS — K121 Other forms of stomatitis: Secondary | ICD-10-CM

## 2013-09-13 DIAGNOSIS — K1230 Oral mucositis (ulcerative), unspecified: Secondary | ICD-10-CM

## 2013-09-26 ENCOUNTER — Encounter (HOSPITAL_COMMUNITY): Payer: Self-pay

## 2013-09-26 ENCOUNTER — Encounter (HOSPITAL_COMMUNITY): Payer: Medicare Other

## 2013-09-26 ENCOUNTER — Encounter (HOSPITAL_COMMUNITY): Payer: Medicare Other | Attending: Oncology

## 2013-09-26 VITALS — BP 95/63 | HR 80 | Temp 98.1°F | Resp 20 | Wt 199.8 lb

## 2013-09-26 DIAGNOSIS — C8190 Hodgkin lymphoma, unspecified, unspecified site: Secondary | ICD-10-CM

## 2013-09-26 DIAGNOSIS — D839 Common variable immunodeficiency, unspecified: Secondary | ICD-10-CM | POA: Insufficient documentation

## 2013-09-26 DIAGNOSIS — C9110 Chronic lymphocytic leukemia of B-cell type not having achieved remission: Secondary | ICD-10-CM | POA: Diagnosis present

## 2013-09-26 DIAGNOSIS — K4090 Unilateral inguinal hernia, without obstruction or gangrene, not specified as recurrent: Secondary | ICD-10-CM

## 2013-09-26 DIAGNOSIS — D61818 Other pancytopenia: Secondary | ICD-10-CM | POA: Insufficient documentation

## 2013-09-26 DIAGNOSIS — Z954 Presence of other heart-valve replacement: Secondary | ICD-10-CM

## 2013-09-26 DIAGNOSIS — I4891 Unspecified atrial fibrillation: Secondary | ICD-10-CM | POA: Diagnosis present

## 2013-09-26 DIAGNOSIS — B002 Herpesviral gingivostomatitis and pharyngotonsillitis: Secondary | ICD-10-CM

## 2013-09-26 DIAGNOSIS — K1370 Unspecified lesions of oral mucosa: Secondary | ICD-10-CM

## 2013-09-26 DIAGNOSIS — D801 Nonfamilial hypogammaglobulinemia: Secondary | ICD-10-CM

## 2013-09-26 LAB — CBC WITH DIFFERENTIAL/PLATELET
Basophils Absolute: 0 10*3/uL (ref 0.0–0.1)
Basophils Relative: 1 % (ref 0–1)
EOS ABS: 0.2 10*3/uL (ref 0.0–0.7)
Eosinophils Relative: 10 % — ABNORMAL HIGH (ref 0–5)
HEMATOCRIT: 30.8 % — AB (ref 39.0–52.0)
HEMOGLOBIN: 10.9 g/dL — AB (ref 13.0–17.0)
LYMPHS ABS: 0.8 10*3/uL (ref 0.7–4.0)
Lymphocytes Relative: 48 % — ABNORMAL HIGH (ref 12–46)
MCH: 32 pg (ref 26.0–34.0)
MCHC: 35.4 g/dL (ref 30.0–36.0)
MCV: 90.3 fL (ref 78.0–100.0)
MONOS PCT: 15 % — AB (ref 3–12)
Monocytes Absolute: 0.3 10*3/uL (ref 0.1–1.0)
NEUTROS ABS: 0.4 10*3/uL — AB (ref 1.7–7.7)
Neutrophils Relative %: 26 % — ABNORMAL LOW (ref 43–77)
Platelets: 85 10*3/uL — ABNORMAL LOW (ref 150–400)
RBC: 3.41 MIL/uL — ABNORMAL LOW (ref 4.22–5.81)
RDW: 15.8 % — ABNORMAL HIGH (ref 11.5–15.5)
WBC: 1.6 10*3/uL — ABNORMAL LOW (ref 4.0–10.5)

## 2013-09-26 LAB — COMPREHENSIVE METABOLIC PANEL
ALT: 26 U/L (ref 0–53)
AST: 28 U/L (ref 0–37)
Albumin: 3.2 g/dL — ABNORMAL LOW (ref 3.5–5.2)
Alkaline Phosphatase: 88 U/L (ref 39–117)
Anion gap: 13 (ref 5–15)
BUN: 15 mg/dL (ref 6–23)
CHLORIDE: 101 meq/L (ref 96–112)
CO2: 24 mEq/L (ref 19–32)
Calcium: 8.7 mg/dL (ref 8.4–10.5)
Creatinine, Ser: 1.56 mg/dL — ABNORMAL HIGH (ref 0.50–1.35)
GFR calc Af Amer: 51 mL/min — ABNORMAL LOW (ref >90)
GFR, EST NON AFRICAN AMERICAN: 44 mL/min — AB (ref >90)
Glucose, Bld: 99 mg/dL (ref 70–99)
Potassium: 3.9 mEq/L (ref 3.7–5.3)
Sodium: 138 mEq/L (ref 137–147)
Total Bilirubin: 0.4 mg/dL (ref 0.3–1.2)
Total Protein: 5.7 g/dL — ABNORMAL LOW (ref 6.0–8.3)

## 2013-09-26 LAB — LACTATE DEHYDROGENASE: LDH: 299 U/L — ABNORMAL HIGH (ref 94–250)

## 2013-09-26 LAB — RETICULOCYTES
RBC.: 3.41 MIL/uL — AB (ref 4.22–5.81)
RETIC COUNT ABSOLUTE: 27.3 10*3/uL (ref 19.0–186.0)
RETIC CT PCT: 0.8 % (ref 0.4–3.1)

## 2013-09-26 MED ORDER — FAMCICLOVIR 500 MG PO TABS: ORAL_TABLET | ORAL | Status: DC

## 2013-09-26 MED ORDER — HEPARIN SOD (PORK) LOCK FLUSH 100 UNIT/ML IV SOLN
500.0000 [IU] | Freq: Once | INTRAVENOUS | Status: AC
  Administered 2013-09-26: 500 [IU] via INTRAVENOUS
  Filled 2013-09-26: qty 5

## 2013-09-26 MED ORDER — SODIUM CHLORIDE 0.9 % IJ SOLN
10.0000 mL | INTRAMUSCULAR | Status: DC | PRN
  Administered 2013-09-26: 10 mL via INTRAVENOUS

## 2013-09-26 NOTE — Progress Notes (Signed)
[facility]  OFFICE PROGRESS NOTE  [name], MD 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 Chronic lymphocytic leukemia    CURRENT THERAPY: Watchful expectation laboratory surveillance  INTERVAL HISTORY: [name] 68 y.o. male returns for followup of chronic lymphocytic leukemia on no treatment at this time having undergone bone marrow examination on 01/22/2013 due to pancytopenia which time several small lymphoid aggregates were seen with minimal B-cell CLL expression with no treatment rendered. In 2006 she was treated for Hodgkin's disease. His CLL was originally diagnosed in 2004 with worsening in 2013 at which time he received 6 cycles of bendamustine and Rituxan followed by Rituxan maintenance for 2 years. Primary problem today is pain in his mouth with mouth sores involving the gums as well as his cheeks and posterior pharynx. He was treated with a Medrol Dosepak without much improvement. It has made it difficult for him to E. and for that reason has lost weight. He denies any fever, night sweats, did have one episode of blood in the stool this morning after straining. He denies any dysuria, he hematuria, epistaxis, hemoptysis, chest pain, PND, orthopnea, or palpitations.  MEDICAL HISTORY: Past Medical History  Diagnosis Date  . Aortic stenosis   . Atrial fibrillation   . Coronary atherosclerosis of native coronary artery   . Hyperlipidemia   . Essential hypertension, benign   . BPH (benign prostatic hypertrophy)   . Elevated WBC count   . Arthritis   . Acid reflux   . Hypogammaglobulinemia   . Pre-diabetes 08/04/2011  . CLL (chronic lymphoblastic leukemia)   . Hodgkin's  disease   . Kidney stones   . Wears glasses   . Wears dentures   . HOH (hard of hearing)   . Indirect inguinal hernia 04/13/2013    INTERIM HISTORY: has HODGKIN'S DISEASE; CLL; HYPERLIPIDEMIA; OBESITY; CORONARY ATHEROSCLEROSIS NATIVE CORONARY ARTERY; AORTIC STENOSIS; Atrial fibrillation; OTHER DYSPHAGIA; AORTIC VALVE REPLACEMENT, HX OF; Essential hypertension, benign; GERD (gastroesophageal reflux disease); Thrombocytopenia; Hypogammaglobulinemia, acquired; Anemia; Pre-diabetes; Dental infection; Piles (hemorrhoids); Reduced vision; Routine general medical examination at a health care facility; Neck mass; Cholelithiasis; Indirect inguinal hernia; Oral thrush; Mourning; Gingivitis, acute; and Oral ulcer on his problem list.   CLL diagnosed in 2004, S/p Chemotherapy, with recurrence in 2013, s/p 6 cycles of Bendamustine and Rituxan followed by maintenance Rituxan. Rituxan was discontinued and he is on watchful expectation at present. He was also diagnosed with Hodgkin's disease and treated in 2006. In view of persistent pancytopenia he underwent bone marrow aspiration and biopsy on 01/22/2013 that revealed hypercellular bone marrow for age and several lymphoid aggregates are present. Flowcytometry failed to show any significant B cell population. IHC's positive for mainly T cells and minimal B-cell population focal coexpression CD5 stain and negative for cyclin D1 suggestive of minimal involvement of the CLL.Marland Kitchen Cytogenetics were normal. He also had the biopsy of tongue lesion by Dr. Benjamine Mola 01/26/2013 and that revealed granulation tissue negative for malignancy  ALLERGIES:  has No Known Allergies.  MEDICATIONS: has a current medication list which includes the following prescription(s): acetaminophen, acyclovir, aspirin ec, folic acid, metoprolol, multiple vitamins-minerals, omega 3, omeprazole, sulfamethoxazole-trimethoprim, atorvastatin, clindamycin, famciclovir, and sulfamethoxazole-trimethoprim, and the  following Facility-Administered Medications: sodium chloride.  SURGICAL HISTORY:  Past Surgical History  Procedure Laterality Date  . Eye surgery    . Aortic valve replacement  10/09    2m Magna Pericardial   . Esophagogastroduodenoscopy      scrapping of throat and stretching  . Lithotripsy    . Bone marrow aspiration    . Portacath placement    . 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 Colon surgery    . Dental surgery  08/2012  . Multiple tooth extractions    . Mass excision N/A 12/05/2012    Procedure: EXCISION MIDLINE NECK MASS;  Surgeon: SAscencion Dike MD;  Location: [facility]  Service: ENT;  Laterality: N/A;  . Tongue biopsy  12/2012    FAMILY HISTORY: family history includes Blindness in his mother; Cancer in his father; Heart failure in his mother.  SOCIAL HISTORY:  reports that he has never smoked. He has never used smokeless tobacco. He reports that he does not drink alcohol or use illicit drugs.  REVIEW OF SYSTEMS:  Other than that discussed above is noncontributory.  PHYSICAL EXAMINATION: ECOG PERFORMANCE STATUS: 1 - Symptomatic but completely ambulatory  Blood pressure 95/63, pulse 80, temperature 98.1 F (36.7 C), temperature source Oral, resp. rate 20, weight 199 lb 12.8 oz (90.629 kg).  GENERAL:alert, no distress and comfortable SKIN: skin color, texture, turgor are normal, no rashes or significant lesions EYES: PERLA; Conjunctiva are pink and non-injected, sclera clear SINUSES: No redness or tenderness over maxillary or ethmoid sinuses OROPHARYNX:no exudate, no erythema on lips, buccal mucosa, or tongue. Aphthous ulcers seen involving the upper-inner lip as well as a right posterior pharynx without any evidence of bleeding. No plaques. NECK: supple, thyroid normal size, non-tender, without nodularity. No masses CHEST:  Sternotomy scar is well-healed. LYMPH:  no palpable lymphadenopathy in the cervical, axillary or inguinal LUNGS:  clear to auscultation and percussion with normal breathing effort HEART: regular rate & rhythm. Grade 2/6 systolic murmur at the aortic focus, nonradiating. ABDOMEN:abdomen soft, non-tender and normal bowel sounds. No hepatomegaly, ascites, or CVA tenderness. Right inguinal hernia easily reducible. MUSCULOSKELETAL:no cyanosis of digits and no clubbing. Range of motion normal.  NEURO: alert & oriented x 3 with fluent speech, no focal motor/sensory deficits   LABORATORY DATA: Appointment on 09/26/2013  Component Date Value Ref Range Status  . WBC 09/26/2013 1.6* 4.0 - 10.5 K/uL Final   CONSISTENT WITH PREVIOUS RESULT  . RBC 09/26/2013 3.41* 4.22 - 5.81 MIL/uL Final  . Hemoglobin 09/26/2013 10.9* 13.0 - 17.0 g/dL Final  . HCT 09/26/2013 30.8* 39.0 - 52.0 % Final  . MCV 09/26/2013 90.3  78.0 - 100.0 fL Final  . MCH 09/26/2013 32.0  26.0 - 34.0 pg Final  . MCHC 09/26/2013 35.4  30.0 - 36.0 g/dL Final  . RDW 09/26/2013 15.8* 11.5 - 15.5 % Final  . Platelets 09/26/2013 85* 150 - 400 K/uL Final   Comment: SPECIMEN CHECKED FOR CLOTS                          CONSISTENT WITH PREVIOUS RESULT  . Neutrophils Relative % 09/26/2013 26* 43 - 77 % Final  . Neutro Abs 09/26/2013 0.4* 1.7 - 7.7 K/uL Final  . Lymphocytes Relative 09/26/2013 48* 12 - 46 % Final  . Lymphs Abs 09/26/2013 0.8  0.7 - 4.0 K/uL Final  . Monocytes Relative 09/26/2013 15* 3 - 12 % Final  .  Monocytes Absolute 09/26/2013 0.3  0.1 - 1.0 K/uL Final  . Eosinophils Relative 09/26/2013 10* 0 - 5 % Final  . Eosinophils Absolute 09/26/2013 0.2  0.0 - 0.7 K/uL Final  . Basophils Relative 09/26/2013 1  0 - 1 % Final  . Basophils Absolute 09/26/2013 0.0  0.0 - 0.1 K/uL Final  . Retic Ct Pct 09/26/2013 0.8  0.4 - 3.1 % Final  . RBC. 09/26/2013 3.41* 4.22 - 5.81 MIL/uL Final  . Retic Count, Manual 09/26/2013 27.3  19.0 - 186.0 K/uL Final  Admission on 08/28/2013, Discharged on 08/28/2013  Component Date Value Ref Range Status  . WBC  08/28/2013 1.9* 4.0 - 10.5 K/uL Final  . RBC 08/28/2013 3.42* 4.22 - 5.81 MIL/uL Final  . Hemoglobin 08/28/2013 11.0* 13.0 - 17.0 g/dL Final  . HCT 08/28/2013 31.8* 39.0 - 52.0 % Final  . MCV 08/28/2013 93.0  78.0 - 100.0 fL Final  . MCH 08/28/2013 32.2  26.0 - 34.0 pg Final  . MCHC 08/28/2013 34.6  30.0 - 36.0 g/dL Final  . RDW 08/28/2013 14.2  11.5 - 15.5 % Final  . Platelets 08/28/2013 102* 150 - 400 K/uL Final   Comment: SPECIMEN CHECKED FOR CLOTS                          CONSISTENT WITH PREVIOUS RESULT  . Neutrophils Relative % 08/28/2013 31* 43 - 77 % Final  . Neutro Abs 08/28/2013 0.6* 1.7 - 7.7 K/uL Final  . Lymphocytes Relative 08/28/2013 37  12 - 46 % Final  . Lymphs Abs 08/28/2013 0.7  0.7 - 4.0 K/uL Final  . Monocytes Relative 08/28/2013 20* 3 - 12 % Final  . Monocytes Absolute 08/28/2013 0.4  0.1 - 1.0 K/uL Final  . Eosinophils Relative 08/28/2013 11* 0 - 5 % Final  . Eosinophils Absolute 08/28/2013 0.2  0.0 - 0.7 K/uL Final  . Basophils Relative 08/28/2013 1  0 - 1 % Final  . Basophils Absolute 08/28/2013 0.0  0.0 - 0.1 K/uL Final  . Sodium 08/28/2013 136* 137 - 147 mEq/L Final  . Potassium 08/28/2013 3.5* 3.7 - 5.3 mEq/L Final  . Chloride 08/28/2013 99  96 - 112 mEq/L Final  . CO2 08/28/2013 22  19 - 32 mEq/L Final  . Glucose, Bld 08/28/2013 114* 70 - 99 mg/dL Final  . BUN 08/28/2013 12  6 - 23 mg/dL Final  . Creatinine, Ser 08/28/2013 1.38* 0.50 - 1.35 mg/dL Final  . Calcium 08/28/2013 8.6  8.4 - 10.5 mg/dL Final  . Total Protein 08/28/2013 5.6* 6.0 - 8.3 g/dL Final  . Albumin 08/28/2013 3.1* 3.5 - 5.2 g/dL Final  . AST 08/28/2013 26  0 - 37 U/L Final  . ALT 08/28/2013 21  0 - 53 U/L Final  . Alkaline Phosphatase 08/28/2013 97  39 - 117 U/L Final  . Total Bilirubin 08/28/2013 0.5  0.3 - 1.2 mg/dL Final  . GFR calc non Af Amer 08/28/2013 51* >90 mL/min Final  . GFR calc Af Amer 08/28/2013 59* >90 mL/min Final   Comment: (NOTE)                          The eGFR has  been calculated using the CKD EPI equation.                          This calculation has not  been validated in all clinical situations.                          eGFR's persistently <90 mL/min signify possible Chronic Kidney                          Disease.  . Color, Urine 08/28/2013 YELLOW  YELLOW Final  . APPearance 08/28/2013 CLEAR  CLEAR Final  . Specific Gravity, Urine 08/28/2013 >1.030* 1.005 - 1.030 Final  . pH 08/28/2013 5.5  5.0 - 8.0 Final  . Glucose, UA 08/28/2013 NEGATIVE  NEGATIVE mg/dL Final  . Hgb urine dipstick 08/28/2013 NEGATIVE  NEGATIVE Final  . Bilirubin Urine 08/28/2013 NEGATIVE  NEGATIVE Final  . Ketones, ur 08/28/2013 NEGATIVE  NEGATIVE mg/dL Final  . Protein, ur 08/28/2013 NEGATIVE  NEGATIVE mg/dL Final  . Urobilinogen, UA 08/28/2013 0.2  0.0 - 1.0 mg/dL Final  . Nitrite 08/28/2013 NEGATIVE  NEGATIVE Final  . Leukocytes, UA 08/28/2013 NEGATIVE  NEGATIVE Final   MICROSCOPIC NOT DONE ON URINES WITH NEGATIVE PROTEIN, BLOOD, LEUKOCYTES, NITRITE, OR GLUCOSE <1000 mg/dL.  Marland Kitchen Specimen Description 08/28/2013 BLOOD RIGHT ANTECUBITAL   Final  . Special Requests 08/28/2013 BOTTLES DRAWN AEROBIC AND ANAEROBIC 8CC   Final  . Culture 08/28/2013 NO GROWTH 5 DAYS   Final  . Report Status 08/28/2013 09/02/2013 FINAL   Final  . Specimen Description 08/28/2013 BLOOD LEFT HAND   Final  . Special Requests 08/28/2013 BOTTLES DRAWN AEROBIC AND ANAEROBIC [place]   Final  . Culture 08/28/2013 NO GROWTH 5 DAYS   Final  . Report Status 08/28/2013 09/02/2013 FINAL   Final    PATHOLOGY: No new pathology.  Urinalysis    Component Value Date/Time   COLORURINE YELLOW 08/28/2013 1259   APPEARANCEUR CLEAR 08/28/2013 1259   LABSPEC >1.030* 08/28/2013 1259   PHURINE 5.5 08/28/2013 1259   GLUCOSEU NEGATIVE 08/28/2013 1259   HGBUR NEGATIVE 08/28/2013 1259   BILIRUBINUR NEGATIVE 08/28/2013 1259   KETONESUR NEGATIVE 08/28/2013 1259   PROTEINUR NEGATIVE 08/28/2013 1259   UROBILINOGEN 0.2 08/28/2013 1259   NITRITE  NEGATIVE 08/28/2013 1259   LEUKOCYTESUR NEGATIVE 08/28/2013 1259    RADIOGRAPHIC STUDIES: Dg Chest 2 View  08/28/2013   CLINICAL DATA:  Fever and generalized weakness. History of leukemia.  EXAM: CHEST  2 VIEW  COMPARISON:  09/21/2012.  FINDINGS: Median sternotomy for prior aortic valve repair. Lateral view degraded by patient arm position. A right-sided Port-A-Cath terminates at the low SVC, similar. Midline trachea. Normal heart size, with atherosclerosis in the transverse aorta. No pleural effusion or pneumothorax. Clear lungs.  IMPRESSION: No acute cardiopulmonary disease.   Electronically Signed   By: Abigail Miyamoto M.D.   On: 08/28/2013 13:56    ASSESSMENT:  #1. Chronic lymphocytic leukemia with minimal residual disease on bone marrow examination in October 2014, stable. #2. Aphthous ulcers of the right buccal mucosa, upper lip, and posterior pharynx, not responsive to oral steroids, possibly Herpes stomatitis. #3. Right inguinal hernia, easily reducible, not symptomatic. #4. History of Hodgkin's disease, nodular sclerosis type, no evidence of disease. #5. 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 Continue using Campho-Phenique locally on the ulcers in his mouth for relief. #2. Famvir 500 mg 3 times a day for 10 days and possibly needing maintenance therapy thereafter with lower dose. #3. Return on 10/11/2013 for followup examination.   All questions were answered. The patient knows to call the  clinic with any problems, questions or concerns. We can certainly see the patient much sooner if necessary.   I spent 25 minutes counseling the patient face to face. The total time spent in the appointment was 30 minutes.    Doroteo Bradford, MD 09/26/2013 3:43 PM  DISCLAIMER:  This note was dictated with voice recognition software.  Similar sounding words can inadvertently be transcribed inaccurately and may not be corrected upon review.

## 2013-09-26 NOTE — Progress Notes (Signed)
[name] presented for labwork. Labs per MD order drawn via Portacath located in the right chest wall accessed with  H 20 needle. Good blood return present. Procedure without incident.  Needle removed intact. Patient tolerated procedure well.

## 2013-09-26 NOTE — Patient Instructions (Signed)
[place] Discharge Instructions  RECOMMENDATIONS MADE BY THE CONSULTANT AND ANY TEST RESULTS WILL BE SENT TO YOUR REFERRING PHYSICIAN.  EXAM FINDINGS BY THE PHYSICIAN TODAY AND SIGNS OR SYMPTOMS TO REPORT TO CLINIC OR PRIMARY PHYSICIAN: Exam and findings as discussed by Dr. Barnet Glasgow.  Wants you to get hydrogen peroxide and dilute it to half strength and swish it around in your mouth then spit it out 4 times daily.  You have aphthous ulcers in your mouth.   MEDICATIONS PRESCRIBED:  Famvir - take as directed for 10 days.  INSTRUCTIONS/FOLLOW-UP: MD wants to see you back in 2 weeks. Port flushes every 6 weeks.  Thank you for choosing [facility] to provide your oncology and hematology care.  To afford each patient quality time with our providers, please arrive at least 15 minutes before your scheduled appointment time.  With your help, our goal is to use those 15 minutes to complete the necessary work-up to ensure our physicians have the information they need to help with your evaluation and healthcare recommendations.    Effective January 1st, 2014, we ask that you re-schedule your appointment with our physicians should you arrive 10 or more minutes late for your appointment.  We strive to give you quality time with our providers, and arriving late affects you and other patients whose appointments are after yours.    Again, thank you for choosing [facility].  Our hope is that these requests will decrease the amount of time that you wait before being seen by our physicians.       _____________________________________________________________  Should you have questions after your visit to [facility], please contact our office at [phone] between the hours of 8:30 a.m. and 4:30 p.m.  Voicemails left after 4:30 p.m. will not be returned until the following business day.  For prescription refill requests, have your pharmacy contact  our office with your prescription refill request.    _______________________________________________________________  We hope that we have given you very good care.  You may receive a patient satisfaction survey in the mail, please complete it and return it as soon as possible.  We value your feedback!  _______________________________________________________________  Have you asked about our STAR program?  STAR stands for Survivorship Training and Rehabilitation, and this is a nationally recognized cancer care program that focuses on survivorship and rehabilitation.  Cancer and cancer treatments may cause problems, such as, pain, making you feel tired and keeping you from doing the things that you need or want to do. Cancer rehabilitation can help. Our goal is to reduce these troubling effects and help you have the best quality of life possible.  You may receive a survey from a nurse that asks questions about your current state of health.  Based on the survey results, all eligible patients will be referred to the [facility] program for an evaluation so we can better serve you!  A frequently asked questions sheet is available upon request.

## 2013-09-29 NOTE — Assessment & Plan Note (Signed)
Deteriorated Hyperlipidemia:Low fat diet discussed and encouraged.  Needs to resume statin therapy , will attempt to find affordable med

## 2013-09-29 NOTE — Progress Notes (Signed)
   Subjective:    Patient ID: [name], male    DOB: [birth year], 68 y.o.   MRN: [id number]  HPI Pt in for re  eval of recently treated oral ulcer as well as review of recent labs. Continues to have ongoing significant oral pain in the area affected with no improvement. No recent fever or chills No other concerns   Review of Systems See HPI Denies recent fever or chills. Denies sinus pressure, nasal congestion, ear pain or sore throat. Denies chest congestion, productive cough or wheezing. Denies chest pains, palpitations and leg swelling        Objective:   Physical Exam  BP 112/74  Pulse 68  Resp 16  Wt 212 lb 6.4 oz (96.344 kg)  SpO2 98% Patient alert and oriented and in no cardiopulmonary distress.  HEENT: No facial asymmetry, EOMI,   oropharynx pink and moist.  Neck supple no JVD, no mass. Oral ulcer in lower jaw under incisors, white and tender dia approx 2.5cm Chest: Clear to auscultation bilaterally.  CVS: S1, S2 systolic  murmur, no S3.Regular rate  ABD: Soft non tender.   Ext: No edema  MS: Adequate ROM spine, shoulders, hips and knees.  Psych: Good eye contact, normal affect. Memory intact not anxious or depressed appearing.  CNS: CN 2-12 intact, power,  normal throughout.no focal deficits noted.       Assessment & Plan:  Oral ulcer Unchanged, despite antibioitc treatment, refer to ENT for furhter eval and help with mx,and topical prep for pain relief.  HYPERLIPIDEMIA Deteriorated Hyperlipidemia:Low fat diet discussed and encouraged.  Needs to resume statin therapy , will attempt to find affordable med   Essential hypertension, benign Controlled, no change in medication DASH diet and commitment to daily physical activity for a minimum of 30 minutes discussed and encouraged, as a part of hypertension management. The importance of attaining a healthy weight is also discussed.

## 2013-09-29 NOTE — Assessment & Plan Note (Signed)
Controlled, no change in medication DASH diet and commitment to daily physical activity for a minimum of 30 minutes discussed and encouraged, as a part of hypertension management. The importance of attaining a healthy weight is also discussed.  

## 2013-09-29 NOTE — Assessment & Plan Note (Signed)
Unchanged, despite antibioitc treatment, refer to ENT for furhter eval and help with mx,and topical prep for pain relief.

## 2013-10-01 LAB — BETA 2 MICROGLOBULIN, SERUM: BETA 2 MICROGLOBULIN: 9.24 mg/L — AB (ref <=2.51)

## 2013-10-05 ENCOUNTER — Other Ambulatory Visit (HOSPITAL_COMMUNITY): Payer: Self-pay | Admitting: Oncology

## 2013-10-05 DIAGNOSIS — D61818 Other pancytopenia: Secondary | ICD-10-CM

## 2013-10-05 DIAGNOSIS — C8190 Hodgkin lymphoma, unspecified, unspecified site: Secondary | ICD-10-CM

## 2013-10-05 DIAGNOSIS — Z954 Presence of other heart-valve replacement: Secondary | ICD-10-CM

## 2013-10-05 DIAGNOSIS — K4090 Unilateral inguinal hernia, without obstruction or gangrene, not specified as recurrent: Secondary | ICD-10-CM

## 2013-10-05 DIAGNOSIS — D801 Nonfamilial hypogammaglobulinemia: Secondary | ICD-10-CM

## 2013-10-05 DIAGNOSIS — C9110 Chronic lymphocytic leukemia of B-cell type not having achieved remission: Secondary | ICD-10-CM

## 2013-10-05 MED ORDER — FOLIC ACID 1 MG PO TABS: 1.0000 mg | ORAL_TABLET | Freq: Every day | ORAL | Status: DC

## 2013-10-05 MED ORDER — ACYCLOVIR 200 MG PO CAPS: 200.0000 mg | ORAL_CAPSULE | Freq: Two times a day (BID) | ORAL | Status: DC

## 2013-10-11 ENCOUNTER — Encounter (HOSPITAL_COMMUNITY): Payer: Self-pay

## 2013-10-11 ENCOUNTER — Encounter (HOSPITAL_BASED_OUTPATIENT_CLINIC_OR_DEPARTMENT_OTHER): Payer: Medicare Other

## 2013-10-11 VITALS — BP 93/55 | HR 74 | Temp 98.1°F | Resp 16 | Wt 213.4 lb

## 2013-10-11 DIAGNOSIS — D61818 Other pancytopenia: Secondary | ICD-10-CM

## 2013-10-11 DIAGNOSIS — K120 Recurrent oral aphthae: Secondary | ICD-10-CM

## 2013-10-11 DIAGNOSIS — C9110 Chronic lymphocytic leukemia of B-cell type not having achieved remission: Secondary | ICD-10-CM

## 2013-10-11 DIAGNOSIS — T451X5A Adverse effect of antineoplastic and immunosuppressive drugs, initial encounter: Secondary | ICD-10-CM

## 2013-10-11 DIAGNOSIS — C8190 Hodgkin lymphoma, unspecified, unspecified site: Secondary | ICD-10-CM

## 2013-10-11 DIAGNOSIS — I4891 Unspecified atrial fibrillation: Secondary | ICD-10-CM

## 2013-10-11 DIAGNOSIS — B002 Herpesviral gingivostomatitis and pharyngotonsillitis: Secondary | ICD-10-CM

## 2013-10-11 NOTE — Progress Notes (Signed)
[place]  OFFICE PROGRESS NOTE  [name] Nakayama, MD [address], [address] [place] [address]  DIAGNOSIS: Herpes stomatitis  CLL (chronic lymphocytic leukemia)  Hodgkin's disease, unspecified(201.90)  Chief Complaint  Patient presents with  . Oral aphthous ulcers    CURRENT THERAPY: Campho-Phenique and Famvir for oral aphthous ulcers.   INTERVAL HISTORY: [name] 68 y.o. male returns for followup after Famvir therapy along with Campho-Phenique for oral aphthous ulcers presumably due to herpes stomatitis.  Mouth pain and ulcers have dissipated. Appetite has improved as evidenced by 10 pound weight gain. He denies a diarrhea, fever, night sweats, easy satiety, cough, wheezing, PND, orthopnea, palpitations, headache, or seizures. He completed Famvir approximately 1 week ago but continues to use half strength peroxide rinses.  MEDICAL HISTORY: Past Medical History  Diagnosis Date  . Aortic stenosis   . Atrial fibrillation   . Coronary atherosclerosis of native coronary artery   . Hyperlipidemia   . Essential hypertension, benign   . BPH (benign prostatic hypertrophy)   . Elevated WBC count   . Arthritis   . Acid reflux   . Hypogammaglobulinemia   . Pre-diabetes 08/04/2011  . CLL (chronic lymphoblastic leukemia)   . Hodgkin's disease   . Kidney stones   . Wears glasses   . Wears dentures   . HOH (hard of hearing)   . Indirect inguinal hernia 04/13/2013    INTERIM HISTORY: has HODGKIN'S DISEASE; CLL; HYPERLIPIDEMIA; OBESITY; CORONARY ATHEROSCLEROSIS NATIVE CORONARY ARTERY; AORTIC STENOSIS; Atrial fibrillation; OTHER DYSPHAGIA; AORTIC VALVE REPLACEMENT, HX OF; Essential hypertension, benign; GERD (gastroesophageal reflux disease); Thrombocytopenia; Hypogammaglobulinemia, acquired; Anemia; Pre-diabetes; Dental infection; Piles (hemorrhoids); Reduced vision; Routine general medical examination at a health care facility;  Neck mass; Cholelithiasis; Indirect inguinal hernia; Oral thrush; Mourning; Gingivitis, acute; and Oral ulcer on his problem list.    ALLERGIES:  has No Known Allergies.  MEDICATIONS: has a current medication list which includes the following prescription(s): acetaminophen, acyclovir, aspirin ec, folic acid, metoprolol, multiple vitamins-minerals, omega 3, omeprazole, sulfamethoxazole-trimethoprim, atorvastatin, clindamycin, famciclovir, and sulfamethoxazole-trimethoprim.  SURGICAL HISTORY:  Past Surgical History  Procedure Laterality Date  . Eye surgery    . Aortic valve replacement  10/09    11m Magna Pericardial   . Esophagogastroduodenoscopy      scrapping of throat and stretching  . Lithotripsy    . Bone marrow aspiration    . Portacath placement    . 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 Colon surgery    . Dental surgery  08/2012  . Multiple tooth extractions    . Mass excision N/A 12/05/2012    Procedure: EXCISION MIDLINE NECK MASS;  Surgeon: [name] Dike MD;  Location[facility]  Service: ENT;  Laterality: N/A;  . Tongue biopsy  12/2012    FAMILY HISTORY: family history includes Blindness in his mother; Cancer in his father; Heart failure in his mother.  SOCIAL HISTORY:  reports that he has never smoked. He has never used smokeless tobacco. He reports that he does not drink alcohol or use illicit drugs.  REVIEW OF SYSTEMS:  Other than that discussed above is noncontributory.  PHYSICAL EXAMINATION: ECOG PERFORMANCE STATUS: 1 - Symptomatic but completely ambulatory  Weight 213 lb 6.4 oz (96.798 kg).  GENERAL:alert, no distress and comfortable SKIN: skin color, texture, turgor are normal, no rashes or significant lesions EYES: PERLA; Conjunctiva are pink  and non-injected, sclera clear SINUSES: No redness or tenderness over maxillary or ethmoid sinuses OROPHARYNX:no exudate, no erythema on lips, buccal mucosa, or tongue. No further evidence  of ulcers or stomatitis. NECK: supple, thyroid normal size, non-tender, without nodularity. No masses CHEST: increased AP diameter with light port in place. LYMPH:  no palpable lymphadenopathy in the cervical, axillary or inguinal LUNGS: clear to auscultation and percussion with normal breathing effort HEART: regular rate & rhythm and no murmurs. ABDOMEN:abdomen soft, non-tender and normal bowel sounds. Right inguinal hernia easily reducible. MUSCULOSKELETAL:no cyanosis of digits and no clubbing. Range of motion normal.  NEURO: alert & oriented x 3 with fluent speech, no focal motor/sensory deficits   LABORATORY DATA: Appointment on 09/26/2013  Component Date Value Ref Range Status  . WBC 09/26/2013 1.6* 4.0 - 10.5 K/uL Final   CONSISTENT WITH PREVIOUS RESULT  . RBC 09/26/2013 3.41* 4.22 - 5.81 MIL/uL Final  . Hemoglobin 09/26/2013 10.9* 13.0 - 17.0 g/dL Final  . HCT 09/26/2013 30.8* 39.0 - 52.0 % Final  . MCV 09/26/2013 90.3  78.0 - 100.0 fL Final  . MCH 09/26/2013 32.0  26.0 - 34.0 pg Final  . MCHC 09/26/2013 35.4  30.0 - 36.0 g/dL Final  . RDW 09/26/2013 15.8* 11.5 - 15.5 % Final  . Platelets 09/26/2013 85* 150 - 400 K/uL Final   Comment: SPECIMEN CHECKED FOR CLOTS                          CONSISTENT WITH PREVIOUS RESULT  . Neutrophils Relative % 09/26/2013 26* 43 - 77 % Final  . Neutro Abs 09/26/2013 0.4* 1.7 - 7.7 K/uL Final  . Lymphocytes Relative 09/26/2013 48* 12 - 46 % Final  . Lymphs Abs 09/26/2013 0.8  0.7 - 4.0 K/uL Final  . Monocytes Relative 09/26/2013 15* 3 - 12 % Final  . Monocytes Absolute 09/26/2013 0.3  0.1 - 1.0 K/uL Final  . Eosinophils Relative 09/26/2013 10* 0 - 5 % Final  . Eosinophils Absolute 09/26/2013 0.2  0.0 - 0.7 K/uL Final  . Basophils Relative 09/26/2013 1  0 - 1 % Final  . Basophils Absolute 09/26/2013 0.0  0.0 - 0.1 K/uL Final  . Sodium 09/26/2013 138  137 - 147 mEq/L Final  . Potassium 09/26/2013 3.9  3.7 - 5.3 mEq/L Final  . Chloride  09/26/2013 101  96 - 112 mEq/L Final  . CO2 09/26/2013 24  19 - 32 mEq/L Final  . Glucose, Bld 09/26/2013 99  70 - 99 mg/dL Final  . BUN 09/26/2013 15  6 - 23 mg/dL Final  . Creatinine, Ser 09/26/2013 1.56* 0.50 - 1.35 mg/dL Final  . Calcium 09/26/2013 8.7  8.4 - 10.5 mg/dL Final  . Total Protein 09/26/2013 5.7* 6.0 - 8.3 g/dL Final  . Albumin 09/26/2013 3.2* 3.5 - 5.2 g/dL Final  . AST 09/26/2013 28  0 - 37 U/L Final  . ALT 09/26/2013 26  0 - 53 U/L Final  . Alkaline Phosphatase 09/26/2013 88  39 - 117 U/L Final  . Total Bilirubin 09/26/2013 0.4  0.3 - 1.2 mg/dL Final  . GFR calc non Af Amer 09/26/2013 44* >90 mL/min Final  . GFR calc Af Amer 09/26/2013 51* >90 mL/min Final   Comment: (NOTE)                          The eGFR has been calculated using the CKD EPI  equation.                          This calculation has not been validated in all clinical situations.                          eGFR's persistently <90 mL/min signify possible Chronic Kidney                          Disease.  . Anion gap 09/26/2013 13  5 - 15 Final  . LDH 09/26/2013 299* 94 - 250 U/L Final  . Retic Ct Pct 09/26/2013 0.8  0.4 - 3.1 % Final  . RBC. 09/26/2013 3.41* 4.22 - 5.81 MIL/uL Final  . Retic Count, Manual 09/26/2013 27.3  19.0 - 186.0 K/uL Final  . Beta-2 Microglobulin 09/26/2013 9.24* <=2.51 mg/L Final   Performed at [facility]: No new pathology.  Urinalysis    Component Value Date/Time   COLORURINE YELLOW 08/28/2013 1259   APPEARANCEUR CLEAR 08/28/2013 1259   LABSPEC >1.030* 08/28/2013 1259   PHURINE 5.5 08/28/2013 1259   GLUCOSEU NEGATIVE 08/28/2013 1259   HGBUR NEGATIVE 08/28/2013 1259   BILIRUBINUR NEGATIVE 08/28/2013 1259   KETONESUR NEGATIVE 08/28/2013 1259   PROTEINUR NEGATIVE 08/28/2013 1259   UROBILINOGEN 0.2 08/28/2013 1259   NITRITE NEGATIVE 08/28/2013 1259   LEUKOCYTESUR NEGATIVE 08/28/2013 1259    RADIOGRAPHIC STUDIES: No results found.  ASSESSMENT:  #1. Chronic  lymphocytic leukemia with minimal residual disease on bone marrow examination in October 2014, stable.  #2. Aphthous ulcers of the right buccal mucosa, upper lip, and posterior pharynx, not responsive to oral steroids, possibly Herpes stomatitis, resolved with Famvir plus peroxide rinses.  #3. Right inguinal hernia, easily reducible, not symptomatic.  #4. History of Hodgkin's disease, nodular sclerosis type, no evidence of disease.  #5. 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 Continue using Campho-Phenique and/or peroxide rinses and if mouth ulcers recur, refill of Ambien prescription 500 mg 3 times a day for 10 days. #2. Followup in 3 months with CBC, reticulocyte count, CMP, LDH, and beta-2 microglobulin.   All questions were answered. The patient knows to call the clinic with any problems, questions or concerns. We can certainly see the patient much sooner if necessary.   I spent 25 minutes counseling the patient face to face. The total time spent in the appointment was 30 minutes.    Doroteo Bradford, MD 10/11/2013 1:59 PM  DISCLAIMER:  This note was dictated with voice recognition software.  Similar sounding words can inadvertently be transcribed inaccurately and may not be corrected upon review.

## 2013-10-11 NOTE — Patient Instructions (Signed)
[place] Discharge Instructions  RECOMMENDATIONS MADE BY THE CONSULTANT AND ANY TEST RESULTS WILL BE SENT TO YOUR REFERRING PHYSICIAN.  EXAM FINDINGS BY THE PHYSICIAN TODAY AND SIGNS OR SYMPTOMS TO REPORT TO CLINIC OR PRIMARY PHYSICIAN: Exam and findings as discussed by Dr. Barnet Glasgow.  If you develop mouth ulcers restart the famvir.  Report fevers, unexplained weight loss, night sweats, etc.  MEDICATIONS PRESCRIBED:  none  INSTRUCTIONS/FOLLOW-UP: Follow-up with port flushes and office visit in 3 months.  Thank you for choosing [facility] to provide your oncology and hematology care.  To afford each patient quality time with our providers, please arrive at least 15 minutes before your scheduled appointment time.  With your help, our goal is to use those 15 minutes to complete the necessary work-up to ensure our physicians have the information they need to help with your evaluation and healthcare recommendations.    Effective January 1st, 2014, we ask that you re-schedule your appointment with our physicians should you arrive 10 or more minutes late for your appointment.  We strive to give you quality time with our providers, and arriving late affects you and other patients whose appointments are after yours.    Again, thank you for choosing [facility].  Our hope is that these requests will decrease the amount of time that you wait before being seen by our physicians.       _____________________________________________________________  Should you have questions after your visit to [facility], please contact our office at [phone] between the hours of 8:30 a.m. and 4:30 p.m.  Voicemails left after 4:30 p.m. will not be returned until the following business day.  For prescription refill requests, have your pharmacy contact our office with your prescription refill request.     _______________________________________________________________  We hope that we have given you very good care.  You may receive a patient satisfaction survey in the mail, please complete it and return it as soon as possible.  We value your feedback!  _______________________________________________________________  Have you asked about our STAR program?  STAR stands for Survivorship Training and Rehabilitation, and this is a nationally recognized cancer care program that focuses on survivorship and rehabilitation.  Cancer and cancer treatments may cause problems, such as, pain, making you feel tired and keeping you from doing the things that you need or want to do. Cancer rehabilitation can help. Our goal is to reduce these troubling effects and help you have the best quality of life possible.  You may receive a survey from a nurse that asks questions about your current state of health.  Based on the survey results, all eligible patients will be referred to the [facility] program for an evaluation so we can better serve you!  A frequently asked questions sheet is available upon request.

## 2013-10-11 NOTE — Addendum Note (Signed)
Addended by: Mellissa Kohut on: 10/11/2013 02:30 PM   Modules accepted: Orders

## 2013-11-07 ENCOUNTER — Encounter (HOSPITAL_COMMUNITY): Payer: Medicare Other

## 2013-11-08 ENCOUNTER — Encounter (HOSPITAL_COMMUNITY): Payer: Medicare Other | Attending: Oncology

## 2013-11-08 DIAGNOSIS — D839 Common variable immunodeficiency, unspecified: Secondary | ICD-10-CM | POA: Insufficient documentation

## 2013-11-08 DIAGNOSIS — K4090 Unilateral inguinal hernia, without obstruction or gangrene, not specified as recurrent: Secondary | ICD-10-CM | POA: Insufficient documentation

## 2013-11-08 DIAGNOSIS — D61818 Other pancytopenia: Secondary | ICD-10-CM | POA: Insufficient documentation

## 2013-11-08 DIAGNOSIS — Z452 Encounter for adjustment and management of vascular access device: Secondary | ICD-10-CM

## 2013-11-08 DIAGNOSIS — C9110 Chronic lymphocytic leukemia of B-cell type not having achieved remission: Secondary | ICD-10-CM

## 2013-11-08 DIAGNOSIS — Z954 Presence of other heart-valve replacement: Secondary | ICD-10-CM | POA: Insufficient documentation

## 2013-11-08 DIAGNOSIS — C8190 Hodgkin lymphoma, unspecified, unspecified site: Secondary | ICD-10-CM | POA: Insufficient documentation

## 2013-11-08 DIAGNOSIS — I4891 Unspecified atrial fibrillation: Secondary | ICD-10-CM | POA: Insufficient documentation

## 2013-11-08 MED ORDER — SODIUM CHLORIDE 0.9 % IJ SOLN
10.0000 mL | Freq: Once | INTRAMUSCULAR | Status: AC
  Administered 2013-11-08: 10 mL via INTRAVENOUS

## 2013-11-08 MED ORDER — HEPARIN SOD (PORK) LOCK FLUSH 100 UNIT/ML IV SOLN
INTRAVENOUS | Status: AC
  Filled 2013-11-08: qty 5

## 2013-11-08 MED ORDER — HEPARIN SOD (PORK) LOCK FLUSH 100 UNIT/ML IV SOLN
500.0000 [IU] | Freq: Once | INTRAVENOUS | Status: AC
  Administered 2013-11-08: 500 [IU] via INTRAVENOUS

## 2013-11-08 NOTE — Progress Notes (Signed)
[name] presented for Portacath access and flush. Proper placement of portacath confirmed by CXR. Portacath located right chest wall accessed with  H 20 needle. Good blood return present. Portacath flushed with 88ml NS and 500U/22ml Heparin and needle removed intact. Procedure without incident. Patient tolerated procedure well.

## 2013-11-22 ENCOUNTER — Encounter: Payer: Self-pay | Admitting: Family Medicine

## 2013-11-22 ENCOUNTER — Ambulatory Visit (INDEPENDENT_AMBULATORY_CARE_PROVIDER_SITE_OTHER): Payer: Medicare Other | Admitting: Family Medicine

## 2013-11-22 VITALS — BP 130/72 | HR 72 | Resp 18 | Ht 70.0 in | Wt 207.0 lb

## 2013-11-22 DIAGNOSIS — J209 Acute bronchitis, unspecified: Secondary | ICD-10-CM | POA: Insufficient documentation

## 2013-11-22 DIAGNOSIS — E785 Hyperlipidemia, unspecified: Secondary | ICD-10-CM

## 2013-11-22 MED ORDER — AZITHROMYCIN 250 MG PO TABS: ORAL_TABLET | ORAL | Status: DC

## 2013-11-22 MED ORDER — PREDNISONE 5 MG PO TABS: 5.0000 mg | ORAL_TABLET | Freq: Two times a day (BID) | ORAL | Status: AC

## 2013-11-22 NOTE — Progress Notes (Signed)
   Subjective:    Patient ID: [name], male    DOB: 27-Dec-1945, 68 y.o.   MRN: [id number]  HPI 5 day h/o chest congestrion, non productive, mucinex not helping, chest feels tight at times, increased nasal congestion. Has had chills and reduced appetite , no documented fevr, does not feel well Increased nasal congestion, mainly clear drainage, no sinus pressure, ear pain or sore throat   Review of Systems See HPI  Denies chest pains, palpitations and leg swelling Denies abdominal pain, nausea, vomiting,diarrhea or constipation.   Denies dysuria, frequency, hesitancy or incontinence. Denies joint pain, swelling and limitation in mobility. Denies headaches, seizures, numbness, or tingling. Denies depression, anxiety or insomnia. Denies skin break down or rash.        Objective:   Physical Exam BP 130/72  Pulse 72  Resp 18  Ht 5\' 10"  (1.778 m)  Wt 207 lb 0.6 oz (93.913 kg)  BMI 29.71 kg/m2  SpO2 100% Patient alert and oriented and in no cardiopulmonary distress.  HEENT: No facial asymmetry, EOMI,   oropharynx pink and moist.  Neck supple no JVD, no mass. TM clear bilaterally Chest: Adequate air entry bi;laterally , few crackles, no wheezes.  CVS: S1, S2 no murmurs, no S3.Regular rate.  ABD: Soft non tender.   Ext: No edema  MS: Adequate ROM spine, shoulders, hips and knees.  Skin: Intact, no ulcerations or rash noted.        Assessment & Plan:  Acute bronchitis antibiotic and short course of prednisone prescribed, pt to call if worsens  HYPERLIPIDEMIA Uncontrolled Updated lab needed at/ before next visit.

## 2013-11-22 NOTE — Patient Instructions (Signed)
F/u as before  I have prescribed an antibiotic for your bronchitis, and to help with cough and allergy symptoms, prednisone for 5 days only  Hope you feel  Better soon

## 2013-11-25 NOTE — Assessment & Plan Note (Signed)
Uncontrolled Updated lab needed at/ before next visit.  

## 2013-11-25 NOTE — Assessment & Plan Note (Signed)
antibiotic and short course of prednisone prescribed, pt to call if worsens

## 2013-12-06 ENCOUNTER — Telehealth: Payer: Self-pay | Admitting: *Deleted

## 2013-12-06 ENCOUNTER — Other Ambulatory Visit: Payer: Self-pay | Admitting: Family Medicine

## 2013-12-06 DIAGNOSIS — R05 Cough: Secondary | ICD-10-CM

## 2013-12-06 DIAGNOSIS — R059 Cough, unspecified: Secondary | ICD-10-CM

## 2013-12-06 MED ORDER — PROMETHAZINE-DM 6.25-15 MG/5ML PO SYRP: ORAL_SOLUTION | ORAL | Status: DC

## 2013-12-06 NOTE — Telephone Encounter (Signed)
Is there anything further that can be prescribed for cough?

## 2013-12-06 NOTE — Telephone Encounter (Signed)
Called and left detailed message on voicemail.  Will await call back.

## 2013-12-06 NOTE — Telephone Encounter (Signed)
Pt called stating Dr. Moshe Cipro has gave him a antibitoic and pretizone for his bronchitis pt states he took it all and he still has a cough and fleam still comes up. Please advise [phone]

## 2013-12-06 NOTE — Telephone Encounter (Signed)
I have ordered a CXR and also phenergan DM is sent in let him know

## 2013-12-07 ENCOUNTER — Ambulatory Visit (HOSPITAL_COMMUNITY)
Admission: RE | Admit: 2013-12-07 | Discharge: 2013-12-07 | Disposition: A | Discharge status: Home / Self Care | Payer: Medicare Other | Source: Ambulatory Visit | Attending: Family Medicine | Admitting: Family Medicine

## 2013-12-07 DIAGNOSIS — R05 Cough: Secondary | ICD-10-CM | POA: Diagnosis present

## 2013-12-07 DIAGNOSIS — R0989 Other specified symptoms and signs involving the circulatory and respiratory systems: Secondary | ICD-10-CM | POA: Diagnosis not present

## 2013-12-07 DIAGNOSIS — R059 Cough, unspecified: Secondary | ICD-10-CM | POA: Diagnosis not present

## 2013-12-07 NOTE — Telephone Encounter (Signed)
noted 

## 2013-12-19 ENCOUNTER — Encounter (HOSPITAL_COMMUNITY): Payer: Medicare Other

## 2013-12-20 ENCOUNTER — Encounter (HOSPITAL_COMMUNITY): Payer: Self-pay | Admitting: Pharmacy Technician

## 2013-12-26 ENCOUNTER — Encounter (HOSPITAL_COMMUNITY): Payer: Medicare Other | Attending: Oncology

## 2013-12-26 ENCOUNTER — Encounter (HOSPITAL_COMMUNITY): Payer: Self-pay

## 2013-12-26 VITALS — BP 122/57 | HR 73 | Temp 98.1°F | Resp 16

## 2013-12-26 DIAGNOSIS — C9110 Chronic lymphocytic leukemia of B-cell type not having achieved remission: Secondary | ICD-10-CM | POA: Diagnosis not present

## 2013-12-26 DIAGNOSIS — I4891 Unspecified atrial fibrillation: Secondary | ICD-10-CM | POA: Diagnosis present

## 2013-12-26 DIAGNOSIS — Z954 Presence of other heart-valve replacement: Secondary | ICD-10-CM | POA: Diagnosis present

## 2013-12-26 DIAGNOSIS — D61818 Other pancytopenia: Secondary | ICD-10-CM | POA: Insufficient documentation

## 2013-12-26 DIAGNOSIS — C8190 Hodgkin lymphoma, unspecified, unspecified site: Secondary | ICD-10-CM | POA: Diagnosis present

## 2013-12-26 DIAGNOSIS — D839 Common variable immunodeficiency, unspecified: Secondary | ICD-10-CM | POA: Diagnosis present

## 2013-12-26 DIAGNOSIS — K4090 Unilateral inguinal hernia, without obstruction or gangrene, not specified as recurrent: Secondary | ICD-10-CM | POA: Diagnosis present

## 2013-12-26 DIAGNOSIS — Z452 Encounter for adjustment and management of vascular access device: Secondary | ICD-10-CM

## 2013-12-26 DIAGNOSIS — Z23 Encounter for immunization: Secondary | ICD-10-CM

## 2013-12-26 LAB — RETICULOCYTES
RBC.: 3.3 MIL/uL — ABNORMAL LOW (ref 4.22–5.81)
RETIC COUNT ABSOLUTE: 42.9 10*3/uL (ref 19.0–186.0)
RETIC CT PCT: 1.3 % (ref 0.4–3.1)

## 2013-12-26 LAB — CBC WITH DIFFERENTIAL/PLATELET
BASOS ABS: 0.1 10*3/uL (ref 0.0–0.1)
Basophils Relative: 1 % (ref 0–1)
Eosinophils Absolute: 0.4 10*3/uL (ref 0.0–0.7)
Eosinophils Relative: 8 % — ABNORMAL HIGH (ref 0–5)
HCT: 31.8 % — ABNORMAL LOW (ref 39.0–52.0)
Hemoglobin: 10.9 g/dL — ABNORMAL LOW (ref 13.0–17.0)
LYMPHS PCT: 50 % — AB (ref 12–46)
Lymphs Abs: 2.7 10*3/uL (ref 0.7–4.0)
MCH: 33 pg (ref 26.0–34.0)
MCHC: 34.3 g/dL (ref 30.0–36.0)
MCV: 96.4 fL (ref 78.0–100.0)
Monocytes Absolute: 0.6 10*3/uL (ref 0.1–1.0)
Monocytes Relative: 12 % (ref 3–12)
NEUTROS ABS: 1.6 10*3/uL — AB (ref 1.7–7.7)
NEUTROS PCT: 30 % — AB (ref 43–77)
PLATELETS: 100 10*3/uL — AB (ref 150–400)
RBC: 3.3 MIL/uL — ABNORMAL LOW (ref 4.22–5.81)
RDW: 16.1 % — AB (ref 11.5–15.5)
WBC: 5.4 10*3/uL (ref 4.0–10.5)

## 2013-12-26 LAB — COMPREHENSIVE METABOLIC PANEL
ALT: 18 U/L (ref 0–53)
AST: 23 U/L (ref 0–37)
Albumin: 3.2 g/dL — ABNORMAL LOW (ref 3.5–5.2)
Alkaline Phosphatase: 112 U/L (ref 39–117)
Anion gap: 9 (ref 5–15)
BILIRUBIN TOTAL: 0.3 mg/dL (ref 0.3–1.2)
BUN: 10 mg/dL (ref 6–23)
CHLORIDE: 104 meq/L (ref 96–112)
CO2: 25 mEq/L (ref 19–32)
Calcium: 8.5 mg/dL (ref 8.4–10.5)
Creatinine, Ser: 1.43 mg/dL — ABNORMAL HIGH (ref 0.50–1.35)
GFR calc Af Amer: 56 mL/min — ABNORMAL LOW (ref >90)
GFR, EST NON AFRICAN AMERICAN: 48 mL/min — AB (ref >90)
Glucose, Bld: 109 mg/dL — ABNORMAL HIGH (ref 70–99)
Potassium: 4.1 mEq/L (ref 3.7–5.3)
SODIUM: 138 meq/L (ref 137–147)
Total Protein: 5.5 g/dL — ABNORMAL LOW (ref 6.0–8.3)

## 2013-12-26 LAB — LACTATE DEHYDROGENASE: LDH: 268 U/L — ABNORMAL HIGH (ref 94–250)

## 2013-12-26 MED ORDER — HEPARIN SOD (PORK) LOCK FLUSH 100 UNIT/ML IV SOLN
INTRAVENOUS | Status: AC
  Filled 2013-12-26: qty 5

## 2013-12-26 MED ORDER — SODIUM CHLORIDE 0.9 % IJ SOLN
10.0000 mL | INTRAMUSCULAR | Status: DC | PRN
  Administered 2013-12-26: 10 mL via INTRAVENOUS

## 2013-12-26 MED ORDER — HEPARIN SOD (PORK) LOCK FLUSH 100 UNIT/ML IV SOLN
500.0000 [IU] | Freq: Once | INTRAVENOUS | Status: AC
  Administered 2013-12-26: 500 [IU] via INTRAVENOUS

## 2013-12-26 MED ORDER — INFLUENZA VAC SPLIT QUAD 0.5 ML IM SUSY
0.5000 mL | PREFILLED_SYRINGE | Freq: Once | INTRAMUSCULAR | Status: AC
  Administered 2013-12-26: 0.5 mL via INTRAMUSCULAR
  Filled 2013-12-26: qty 0.5

## 2013-12-26 NOTE — Progress Notes (Signed)
[name] presented for Portacath access and flush. Proper placement of portacath confirmed by CXR. Portacath located rt  chest wall accessed with  H 20 needle. Good blood return present. Portacath flushed with 59ml NS and 500U/22ml Heparin and needle removed intact. Procedure without incident. Patient tolerated procedure well.

## 2013-12-28 ENCOUNTER — Encounter (HOSPITAL_COMMUNITY): Payer: Self-pay

## 2013-12-28 LAB — BETA 2 MICROGLOBULIN, SERUM: BETA 2 MICROGLOBULIN: 8.69 mg/L — AB (ref <=2.51)

## 2013-12-28 NOTE — Pre-Procedure Instructions (Signed)
Telephone call completed.  Patient verbalized understanding of instructions.

## 2013-12-31 ENCOUNTER — Encounter (HOSPITAL_COMMUNITY)
Admission: RE | Admit: 2013-12-31 | Discharge: 2013-12-31 | Disposition: A | Discharge status: Home / Self Care | Payer: Medicare Other | Source: Ambulatory Visit | Attending: Ophthalmology | Admitting: Ophthalmology

## 2013-12-31 ENCOUNTER — Encounter (HOSPITAL_COMMUNITY): Admission: RE | Admit: 2013-12-31 | Payer: Medicare Other | Source: Ambulatory Visit

## 2014-01-03 ENCOUNTER — Encounter (HOSPITAL_COMMUNITY): Payer: Medicare Other | Attending: Oncology

## 2014-01-03 VITALS — BP 134/66 | HR 69 | Temp 97.8°F | Resp 20 | Wt 216.0 lb

## 2014-01-03 DIAGNOSIS — Z8571 Personal history of Hodgkin lymphoma: Secondary | ICD-10-CM

## 2014-01-03 DIAGNOSIS — D649 Anemia, unspecified: Secondary | ICD-10-CM

## 2014-01-03 DIAGNOSIS — D696 Thrombocytopenia, unspecified: Secondary | ICD-10-CM

## 2014-01-03 DIAGNOSIS — D479 Neoplasm of uncertain behavior of lymphoid, hematopoietic and related tissue, unspecified: Secondary | ICD-10-CM

## 2014-01-03 DIAGNOSIS — C9110 Chronic lymphocytic leukemia of B-cell type not having achieved remission: Secondary | ICD-10-CM | POA: Insufficient documentation

## 2014-01-03 DIAGNOSIS — Z23 Encounter for immunization: Secondary | ICD-10-CM | POA: Diagnosis present

## 2014-01-03 DIAGNOSIS — D801 Nonfamilial hypogammaglobulinemia: Secondary | ICD-10-CM

## 2014-01-03 DIAGNOSIS — C8190 Hodgkin lymphoma, unspecified, unspecified site: Secondary | ICD-10-CM | POA: Diagnosis present

## 2014-01-03 DIAGNOSIS — C9112 Chronic lymphocytic leukemia of B-cell type in relapse: Secondary | ICD-10-CM

## 2014-01-03 DIAGNOSIS — R161 Splenomegaly, not elsewhere classified: Secondary | ICD-10-CM

## 2014-01-03 NOTE — Patient Instructions (Signed)
[place] Discharge Instructions  RECOMMENDATIONS MADE BY THE CONSULTANT AND ANY TEST RESULTS WILL BE SENT TO YOUR REFERRING PHYSICIAN.  EXAM FINDINGS BY THE PHYSICIAN TODAY AND SIGNS OR SYMPTOMS TO REPORT TO CLINIC OR PRIMARY PHYSICIAN: Exam and findings as discussed by Dr Bubba Hales.  Please have CT scan done at scheduled time.  If you need to reschedule, call us asap.  Return here for port flush and office visit in November.   Thank you for choosing [facility] to provide your oncology and hematology care.  To afford each patient quality time with our providers, please arrive at least 15 minutes before your scheduled appointment time.  With your help, our goal is to use those 15 minutes to complete the necessary work-up to ensure our physicians have the information they need to help with your evaluation and healthcare recommendations.    Effective January 1st, 2014, we ask that you re-schedule your appointment with our physicians should you arrive 10 or more minutes late for your appointment.  We strive to give you quality time with our providers, and arriving late affects you and other patients whose appointments are after yours.    Again, thank you for choosing [facility].  Our hope is that these requests will decrease the amount of time that you wait before being seen by our physicians.       _____________________________________________________________  Should you have questions after your visit to [facility], please contact our office at [phone] between the hours of 8:30 a.m. and 4:30 p.m.  Voicemails left after 4:30 p.m. will not be returned until the following business day.  For prescription refill requests, have your pharmacy contact our office with your prescription refill request.    _______________________________________________________________  We hope that we have given you very good care.  You may receive a  patient satisfaction survey in the mail, please complete it and return it as soon as possible.  We value your feedback!  _______________________________________________________________  Have you asked about our STAR program?  STAR stands for Survivorship Training and Rehabilitation, and this is a nationally recognized cancer care program that focuses on survivorship and rehabilitation.  Cancer and cancer treatments may cause problems, such as, pain, making you feel tired and keeping you from doing the things that you need or want to do. Cancer rehabilitation can help. Our goal is to reduce these troubling effects and help you have the best quality of life possible.  You may receive a survey from a nurse that asks questions about your current state of health.  Based on the survey results, all eligible patients will be referred to the [facility] program for an evaluation so we can better serve you!  A frequently asked questions sheet is available upon request.

## 2014-01-04 LAB — IGG, IGA, IGM
IGG (IMMUNOGLOBIN G), SERUM: 117 mg/dL — AB (ref 650–1600)
IgA: 10 mg/dL — ABNORMAL LOW (ref 68–379)
IgM, Serum: 5 mg/dL — ABNORMAL LOW (ref 41–251)

## 2014-01-04 MED ORDER — LIDOCAINE HCL (PF) 1 % IJ SOLN
INTRAMUSCULAR | Status: AC
  Filled 2014-01-04: qty 2

## 2014-01-04 MED ORDER — NEOMYCIN-POLYMYXIN-DEXAMETH 3.5-10000-0.1 OP SUSP
OPHTHALMIC | Status: AC
  Filled 2014-01-04: qty 5

## 2014-01-04 MED ORDER — TETRACAINE HCL 0.5 % OP SOLN
OPHTHALMIC | Status: AC
  Filled 2014-01-04: qty 2

## 2014-01-04 MED ORDER — PHENYLEPHRINE HCL 2.5 % OP SOLN
OPHTHALMIC | Status: AC
  Filled 2014-01-04: qty 15

## 2014-01-04 MED ORDER — CYCLOPENTOLATE-PHENYLEPHRINE OP SOLN OPTIME - NO CHARGE
OPHTHALMIC | Status: AC
  Filled 2014-01-04: qty 2

## 2014-01-04 MED ORDER — LIDOCAINE HCL 3.5 % OP GEL
OPHTHALMIC | Status: AC
  Filled 2014-01-04: qty 1

## 2014-01-06 NOTE — Progress Notes (Signed)
[facility] OFFICE PROGRESS NOTE  PCP [name], MD [address] [place] [address]  DIAGNOSIS: Splenomegaly - Plan: IgG, IgA, IgM, IgG, IgA, IgM, CT Chest Wo Contrast, CT Abdomen Pelvis Wo Contrast, CT Chest Wo Contrast 2. CLL (Chronic Lymphocytic Leukemia) diagnosed in 2004 and treated with FCR. Remained in remission until 2012. Treatment changed to Bendamustine /Rituxan. Repeat bone marrow in April of 2014 showed minimal residual disease. 3. 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 is a 68 yo man who is returning to the clinic regarding the above diagnoses. His last CT scans were in June of 2014. Denies "B" symptoms. He has been treated for oral aphthous ulcers. Recently seen at Dr. Griffin Dakin office and treated for bronchitis. Denies abdominal discomfort. He has itching.    MEDICAL HISTORY:  Past Medical History  Diagnosis Date  . Aortic stenosis   . Atrial fibrillation   . Coronary atherosclerosis of native coronary artery   . Hyperlipidemia   . Essential hypertension, benign   . BPH (benign prostatic hypertrophy)   . Elevated WBC count   . Arthritis   . Acid reflux   . Hypogammaglobulinemia   . Pre-diabetes 08/04/2011  . CLL (chronic lymphoblastic leukemia)   . Hodgkin's disease   . Kidney stones   . Wears glasses   . Wears dentures   . HOH (hard of hearing)   . Indirect inguinal hernia 04/13/2013    has HODGKIN'S DISEASE; CLL; HYPERLIPIDEMIA; OBESITY; CORONARY ATHEROSCLEROSIS NATIVE CORONARY ARTERY; AORTIC STENOSIS; Atrial fibrillation; OTHER DYSPHAGIA; AORTIC VALVE REPLACEMENT, HX OF; Essential hypertension, benign; GERD (gastroesophageal reflux disease); Thrombocytopenia; Hypogammaglobulinemia, acquired; Anemia; Pre-diabetes; Dental infection; Piles (hemorrhoids); Reduced vision; Routine general medical examination  at a health care facility; Neck mass; Cholelithiasis; Indirect inguinal hernia; Oral thrush; Mourning; Gingivitis, acute; Oral ulcer; and Acute bronchitis on his problem list.    ALLERGIES:  has No Known Allergies.  MEDICATIONS: has a current medication list which includes the following prescription(s): acetaminophen, acyclovir, aspirin ec, folic acid, metoprolol, multiple vitamins-minerals, omega 3, omeprazole, and sulfamethoxazole-trimethoprim.  FAMILY HISTORY: family history includes Blindness in his mother; Cancer in his father; Heart failure in his mother.  REVIEW OF SYSTEMS: Recorded by the nurse and charted in EPIC     Pain Assessment Pain Score: 0-No pain  Other than that discussed above is noncontributory.    PHYSICAL EXAMINATION:   weight is 216 lb (97.977 kg). His oral temperature is 97.8 F (36.6 C). His blood pressure is 134/66 and his pulse is 69. His respiration is 20.    GENERAL:alert, no distress and comfortable EYES: PERRL, EOM intact. Conjunctiva are pink and sclera nonicteric OROPHARYNX: no exudate, no erythema, and tongue normal  NECK: supple, non-tender, without cervical or supraclavicular adenopathy.  LUNGS: clear to auscultation and percussion with normal breathing effort HEART: regular rate & rhythm and no murmurs, S3, or S4 ABDOMEN: soft, non-tender, normal bowel sounds;  liver not palpable. Spleen with fullness in the LUQ. No inguinal adenopathy. MUSCULOSKELETAL: Spine without localized tenderness or peripheral edema.  SKIN: no jaundice, bruises, or rashes. NEURO: alert & oriented x 3 with fluent speech, no focal motor/sensory deficits      LABORATORY DATA: Office Visit on 01/03/2014  Component Date Value Ref Range Status  . IgG (Immunoglobin G), Serum 01/03/2014 117* 650 - 1600 mg/dL Final  . IgA 01/03/2014 10* 68 - 379  mg/dL Final  . IgM, Serum 01/03/2014 5* 41 - 251 mg/dL Final   Performed at [facility] on 12/26/2013    Component Date Value Ref Range Status  . WBC 12/26/2013 5.4  4.0 - 10.5 K/uL Final  . RBC 12/26/2013 3.30* 4.22 - 5.81 MIL/uL Final  . Hemoglobin 12/26/2013 10.9* 13.0 - 17.0 g/dL Final  . HCT 12/26/2013 31.8* 39.0 - 52.0 % Final  . MCV 12/26/2013 96.4  78.0 - 100.0 fL Final  . MCH 12/26/2013 33.0  26.0 - 34.0 pg Final  . MCHC 12/26/2013 34.3  30.0 - 36.0 g/dL Final  . RDW 12/26/2013 16.1* 11.5 - 15.5 % Final  . Platelets 12/26/2013 100* 150 - 400 K/uL Final   Comment: SPECIMEN CHECKED FOR CLOTS                          PLATELET COUNT CONFIRMED BY SMEAR  . Neutrophils Relative % 12/26/2013 30* 43 - 77 % Final  . Neutro Abs 12/26/2013 1.6* 1.7 - 7.7 K/uL Final  . Lymphocytes Relative 12/26/2013 50* 12 - 46 % Final  . Lymphs Abs 12/26/2013 2.7  0.7 - 4.0 K/uL Final  . Monocytes Relative 12/26/2013 12  3 - 12 % Final  . Monocytes Absolute 12/26/2013 0.6  0.1 - 1.0 K/uL Final  . Eosinophils Relative 12/26/2013 8* 0 - 5 % Final  . Eosinophils Absolute 12/26/2013 0.4  0.0 - 0.7 K/uL Final  . Basophils Relative 12/26/2013 1  0 - 1 % Final  . Basophils Absolute 12/26/2013 0.1  0.0 - 0.1 K/uL Final  . Sodium 12/26/2013 138  137 - 147 mEq/L Final  . Potassium 12/26/2013 4.1  3.7 - 5.3 mEq/L Final  . Chloride 12/26/2013 104  96 - 112 mEq/L Final  . CO2 12/26/2013 25  19 - 32 mEq/L Final  . Glucose, Bld 12/26/2013 109* 70 - 99 mg/dL Final  . BUN 12/26/2013 10  6 - 23 mg/dL Final  . Creatinine, Ser 12/26/2013 1.43* 0.50 - 1.35 mg/dL Final  . Calcium 12/26/2013 8.5  8.4 - 10.5 mg/dL Final  . Total Protein 12/26/2013 5.5* 6.0 - 8.3 g/dL Final  . Albumin 12/26/2013 3.2* 3.5 - 5.2 g/dL Final  . AST 12/26/2013 23  0 - 37 U/L Final  . ALT 12/26/2013 18  0 - 53 U/L Final  . Alkaline Phosphatase 12/26/2013 112  39 - 117 U/L Final  . Total Bilirubin 12/26/2013 0.3  0.3 - 1.2 mg/dL Final  . GFR calc non Af Amer 12/26/2013 48* >90 mL/min Final  . GFR calc Af Amer 12/26/2013 56* >90 mL/min Final    Comment: (NOTE)                          The eGFR has been calculated using the CKD EPI equation.                          This calculation has not been validated in all clinical situations.                          eGFR's persistently <90 mL/min signify possible Chronic Kidney                          Disease.  . Anion gap 12/26/2013 9  5 - 15 Final  . LDH 12/26/2013 268* 94 - 250 U/L Final  . Retic Ct Pct 12/26/2013 1.3  0.4 - 3.1 % Final  . RBC. 12/26/2013 3.30* 4.22 - 5.81 MIL/uL Final  . Retic Count, Manual 12/26/2013 42.9  19.0 - 186.0 K/uL Final  . Beta-2 Microglobulin 12/26/2013 8.69* <=2.51 mg/L Final   Performed at [facility]: No results found.  ASSESSMENT:   1. Distant history of Hodgkins Disease. Documentation of details not available  2. CLL S/P first and second line therapies. Residual mild anemia and thrombocytopenia  3. 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 Multiple comorbidities.      RECOMMENDATIONS/PLAN:   1. Restaging CT scans.  2. The patient is immunocompromised and has had episodes of oral ulcers and bronchitis. Would offer him treatment with IVIG replacement therapy. The patient will be returning for an appointment after his CT scans.         Darrall Dears, MD 01/06/2014 8:47 PM

## 2014-01-07 ENCOUNTER — Ambulatory Visit (HOSPITAL_COMMUNITY): Payer: Medicare Other | Admitting: Anesthesiology

## 2014-01-07 ENCOUNTER — Encounter (HOSPITAL_COMMUNITY): Payer: Medicare Other | Admitting: Anesthesiology

## 2014-01-07 ENCOUNTER — Encounter (HOSPITAL_COMMUNITY)
Admission: RE | Disposition: A | Discharge status: Home / Self Care | Payer: Self-pay | Source: Ambulatory Visit | Attending: Ophthalmology

## 2014-01-07 ENCOUNTER — Encounter (HOSPITAL_COMMUNITY): Payer: Self-pay | Admitting: *Deleted

## 2014-01-07 ENCOUNTER — Ambulatory Visit (HOSPITAL_COMMUNITY)
Admission: RE | Admit: 2014-01-07 | Discharge: 2014-01-07 | Disposition: A | Discharge status: Home / Self Care | Payer: Medicare Other | Source: Ambulatory Visit | Attending: Ophthalmology | Admitting: Ophthalmology

## 2014-01-07 DIAGNOSIS — H25811 Combined forms of age-related cataract, right eye: Secondary | ICD-10-CM | POA: Insufficient documentation

## 2014-01-07 DIAGNOSIS — Z951 Presence of aortocoronary bypass graft: Secondary | ICD-10-CM | POA: Diagnosis not present

## 2014-01-07 DIAGNOSIS — D649 Anemia, unspecified: Secondary | ICD-10-CM | POA: Diagnosis not present

## 2014-01-07 DIAGNOSIS — Z79899 Other long term (current) drug therapy: Secondary | ICD-10-CM | POA: Insufficient documentation

## 2014-01-07 DIAGNOSIS — K219 Gastro-esophageal reflux disease without esophagitis: Secondary | ICD-10-CM | POA: Diagnosis not present

## 2014-01-07 DIAGNOSIS — Z7982 Long term (current) use of aspirin: Secondary | ICD-10-CM | POA: Diagnosis not present

## 2014-01-07 DIAGNOSIS — I2510 Atherosclerotic heart disease of native coronary artery without angina pectoris: Secondary | ICD-10-CM | POA: Diagnosis not present

## 2014-01-07 DIAGNOSIS — I10 Essential (primary) hypertension: Secondary | ICD-10-CM | POA: Diagnosis not present

## 2014-01-07 HISTORY — PX: CATARACT EXTRACTION W/PHACO: SHX586

## 2014-01-07 LAB — GLUCOSE, CAPILLARY: Glucose-Capillary: 107 mg/dL — ABNORMAL HIGH (ref 70–99)

## 2014-01-07 SURGERY — PHACOEMULSIFICATION, CATARACT, WITH IOL INSERTION
Anesthesia: Monitor Anesthesia Care | Site: Eye | Laterality: Right

## 2014-01-07 MED ORDER — LACTATED RINGERS IV SOLN
INTRAVENOUS | Status: DC | PRN
  Administered 2014-01-07: 08:00:00 via INTRAVENOUS

## 2014-01-07 MED ORDER — CYCLOPENTOLATE-PHENYLEPHRINE 0.2-1 % OP SOLN
1.0000 [drp] | OPHTHALMIC | Status: AC
  Administered 2014-01-07 (×3): 1 [drp] via OPHTHALMIC

## 2014-01-07 MED ORDER — NEOMYCIN-POLYMYXIN-DEXAMETH 3.5-10000-0.1 OP SUSP
OPHTHALMIC | Status: DC | PRN
Start: 2014-01-07 — End: 2014-01-07
  Administered 2014-01-07: 2 [drp] via OPHTHALMIC

## 2014-01-07 MED ORDER — EPINEPHRINE HCL 1 MG/ML IJ SOLN
INTRAOCULAR | Status: DC | PRN
  Administered 2014-01-07: 08:00:00

## 2014-01-07 MED ORDER — PHENYLEPHRINE HCL 2.5 % OP SOLN
1.0000 [drp] | OPHTHALMIC | Status: AC
  Administered 2014-01-07 (×3): 1 [drp] via OPHTHALMIC

## 2014-01-07 MED ORDER — EPINEPHRINE HCL 1 MG/ML IJ SOLN
INTRAMUSCULAR | Status: AC
  Filled 2014-01-07: qty 1

## 2014-01-07 MED ORDER — FENTANYL CITRATE 0.05 MG/ML IJ SOLN: 25.0000 ug | INTRAMUSCULAR | Status: DC | PRN

## 2014-01-07 MED ORDER — BSS IO SOLN
INTRAOCULAR | Status: DC | PRN
  Administered 2014-01-07: 15 mL via INTRAOCULAR

## 2014-01-07 MED ORDER — LIDOCAINE HCL 3.5 % OP GEL
1.0000 "application " | Freq: Once | OPHTHALMIC | Status: AC
  Administered 2014-01-07: 1 via OPHTHALMIC

## 2014-01-07 MED ORDER — TETRACAINE HCL 0.5 % OP SOLN
1.0000 [drp] | OPHTHALMIC | Status: AC
  Administered 2014-01-07 (×3): 1 [drp] via OPHTHALMIC

## 2014-01-07 MED ORDER — LACTATED RINGERS IV SOLN
INTRAVENOUS | Status: DC
  Administered 2014-01-07: 08:00:00 via INTRAVENOUS

## 2014-01-07 MED ORDER — POVIDONE-IODINE 5 % OP SOLN
OPHTHALMIC | Status: DC | PRN
  Administered 2014-01-07: 1 via OPHTHALMIC

## 2014-01-07 MED ORDER — ONDANSETRON HCL 4 MG/2ML IJ SOLN: 4.0000 mg | Freq: Once | INTRAMUSCULAR | Status: DC | PRN

## 2014-01-07 MED ORDER — PROVISC 10 MG/ML IO SOLN
INTRAOCULAR | Status: DC | PRN
  Administered 2014-01-07: 0.85 mL via INTRAOCULAR

## 2014-01-07 MED ORDER — FENTANYL CITRATE 0.05 MG/ML IJ SOLN
25.0000 ug | INTRAMUSCULAR | Status: AC
  Administered 2014-01-07 (×2): 25 ug via INTRAVENOUS

## 2014-01-07 MED ORDER — MIDAZOLAM HCL 2 MG/2ML IJ SOLN
INTRAMUSCULAR | Status: AC
  Filled 2014-01-07: qty 2

## 2014-01-07 MED ORDER — LIDOCAINE HCL (PF) 1 % IJ SOLN
INTRAOCULAR | Status: DC | PRN
  Administered 2014-01-07: 08:00:00 via OPHTHALMIC

## 2014-01-07 MED ORDER — MIDAZOLAM HCL 2 MG/2ML IJ SOLN
1.0000 mg | INTRAMUSCULAR | Status: DC | PRN
  Administered 2014-01-07: 2 mg via INTRAVENOUS

## 2014-01-07 MED ORDER — FENTANYL CITRATE 0.05 MG/ML IJ SOLN
INTRAMUSCULAR | Status: AC
  Filled 2014-01-07: qty 2

## 2014-01-07 SURGICAL SUPPLY — 34 items
CAPSULAR TENSION RING-AMO (OPHTHALMIC RELATED) IMPLANT
CLOTH BEACON ORANGE TIMEOUT ST (SAFETY) ×2 IMPLANT
EYE SHIELD UNIVERSAL CLEAR (GAUZE/BANDAGES/DRESSINGS) ×2 IMPLANT
GLOVE BIO SURGEON STRL SZ 6.5 (GLOVE) IMPLANT
GLOVE BIO SURGEONS STRL SZ 6.5 (GLOVE)
GLOVE BIOGEL PI IND STRL 6.5 (GLOVE) IMPLANT
GLOVE BIOGEL PI IND STRL 7.0 (GLOVE) IMPLANT
GLOVE BIOGEL PI IND STRL 7.5 (GLOVE) IMPLANT
GLOVE BIOGEL PI INDICATOR 6.5 (GLOVE)
GLOVE BIOGEL PI INDICATOR 7.0 (GLOVE) ×2
GLOVE BIOGEL PI INDICATOR 7.5 (GLOVE)
GLOVE ECLIPSE 6.5 STRL STRAW (GLOVE) IMPLANT
GLOVE ECLIPSE 7.0 STRL STRAW (GLOVE) IMPLANT
GLOVE ECLIPSE 7.5 STRL STRAW (GLOVE) IMPLANT
GLOVE EXAM NITRILE LRG STRL (GLOVE) IMPLANT
GLOVE EXAM NITRILE MD LF STRL (GLOVE) ×2 IMPLANT
GLOVE SKINSENSE NS SZ6.5 (GLOVE)
GLOVE SKINSENSE NS SZ7.0 (GLOVE)
GLOVE SKINSENSE STRL SZ6.5 (GLOVE) IMPLANT
GLOVE SKINSENSE STRL SZ7.0 (GLOVE) IMPLANT
KIT VITRECTOMY (OPHTHALMIC RELATED) IMPLANT
PAD ARMBOARD 7.5X6 YLW CONV (MISCELLANEOUS) ×2 IMPLANT
PROC W NO LENS (INTRAOCULAR LENS)
PROC W SPEC LENS (INTRAOCULAR LENS)
PROCESS W NO LENS (INTRAOCULAR LENS) IMPLANT
PROCESS W SPEC LENS (INTRAOCULAR LENS) IMPLANT
RETRACTOR IRIS SIGHTPATH (OPHTHALMIC RELATED) IMPLANT
RING MALYGIN (MISCELLANEOUS) IMPLANT
SIGHTPATH CAT PROC W REG LENS (Ophthalmic Related) ×3 IMPLANT
SYRINGE LUER LOK 1CC (MISCELLANEOUS) ×2 IMPLANT
TAPE SURG TRANSPORE 1 IN (GAUZE/BANDAGES/DRESSINGS) IMPLANT
TAPE SURGICAL TRANSPORE 1 IN (GAUZE/BANDAGES/DRESSINGS) ×2
VISCOELASTIC ADDITIONAL (OPHTHALMIC RELATED) IMPLANT
WATER STERILE IRR 250ML POUR (IV SOLUTION) ×2 IMPLANT

## 2014-01-07 NOTE — Transfer of Care (Signed)
Immediate Anesthesia Transfer of Care Note  Patient: [name]  Procedure(s) Performed: Procedure(s) with comments: CATARACT EXTRACTION PHACO AND INTRAOCULAR LENS PLACEMENT (IOC) (Right) - CDE:15.31  Patient Location: Short Stay  Anesthesia Type:MAC  Level of Consciousness: awake, alert , oriented and patient cooperative  Airway & Oxygen Therapy: Patient Spontanous Breathing  Post-op Assessment: Report given to PACU RN and Post -op Vital signs reviewed and stable  Post vital signs: Reviewed and stable  Complications: No apparent anesthesia complications

## 2014-01-07 NOTE — Anesthesia Preprocedure Evaluation (Signed)
Anesthesia Evaluation  Patient identified by MRN, date of birth, ID band Patient awake    Reviewed: Allergy & Precautions, H&P , NPO status , Patient's Chart, lab work & pertinent test results, reviewed documented beta blocker date and time   Airway Mallampati: I  TM Distance: >3 FB Neck ROM: Full    Dental  (+) Edentulous Upper, Poor Dentition, Chipped   Pulmonary  breath sounds clear to auscultation        Cardiovascular hypertension, Pt. on medications and Pt. on home beta blockers + CAD and + CABG + dysrhythmias + Valvular Problems/Murmurs AS Rhythm:Regular     Neuro/Psych    GI/Hepatic GERD-  Medicated and Controlled,  Endo/Other    Renal/GU      Musculoskeletal  (+) Arthritis -,   Abdominal   Peds  Hematology  (+) Blood dyscrasia (CLL), anemia ,   Anesthesia Other Findings   Reproductive/Obstetrics                             Anesthesia Physical Anesthesia Plan  ASA: III  Anesthesia Plan: MAC   Post-op Pain Management:    Induction: Intravenous  Airway Management Planned: Nasal Cannula  Additional Equipment:   Intra-op Plan:   Post-operative Plan:   Informed Consent: I have reviewed the patients History and Physical, chart, labs and discussed the procedure including the risks, benefits and alternatives for the proposed anesthesia with the patient or authorized representative who has indicated his/her understanding and acceptance.     Plan Discussed with:   Anesthesia Plan Comments:         Anesthesia Quick Evaluation  

## 2014-01-07 NOTE — Op Note (Signed)
Date of Admission: 01/07/2014  Date of Surgery: 01/07/2014   Pre-Op Dx: Cataract Right Eye  Post-Op Dx: Senile Combined Cataract Right  Eye,  Dx Code V76.160  Surgeon: Tonny Branch, M.D.  Assistants: None  Anesthesia: Topical with MAC  Indications: Painless, progressive loss of vision with compromise of daily activities.  Surgery: Cataract Extraction with Intraocular lens Implant Right Eye  Discription: The patient had dilating drops and viscous lidocaine placed into the Right eye in the pre-op holding area. After transfer to the operating room, a time out was performed. The patient was then prepped and draped. Beginning with a 66 degree blade a paracentesis port was made at the surgeon's 2 o'clock position. The anterior chamber was then filled with 1% non-preserved lidocaine. This was followed by filling the anterior chamber with Provisc.  A 2.46mm keratome blade was used to make a clear corneal incision at the temporal limbus.  A bent cystatome needle was used to create a continuous tear capsulotomy. Hydrodissection was performed with balanced salt solution on a Fine canula. The lens nucleus was then removed using the phacoemulsification handpiece. Residual cortex was removed with the I&A handpiece. The anterior chamber and capsular bag were refilled with Provisc. A posterior chamber intraocular lens was placed into the capsular bag with it's injector. The implant was positioned with the Kuglan hook. The Provisc was then removed from the anterior chamber and capsular bag with the I&A handpiece. Stromal hydration of the main incision and paracentesis port was performed with BSS on a Fine canula. The wounds were tested for leak which was negative. The patient tolerated the procedure well. There were no operative complications. The patient was then transferred to the recovery room in stable condition.  Complications: None  Specimen: None  EBL: None  Prosthetic device: Hoya iSert 250, power 20.5  D, SN [id number].

## 2014-01-07 NOTE — Anesthesia Postprocedure Evaluation (Signed)
  Anesthesia Post-op Note  Patient: [name]  Procedure(s) Performed: Procedure(s) with comments: CATARACT EXTRACTION PHACO AND INTRAOCULAR LENS PLACEMENT (IOC) (Right) - CDE:15.31  Patient Location: Short Stay  Anesthesia Type:MAC  Level of Consciousness: awake, alert , oriented and patient cooperative  Airway and Oxygen Therapy: Patient Spontanous Breathing  Post-op Pain: none  Post-op Assessment: Post-op Vital signs reviewed, Patient's Cardiovascular Status Stable, Respiratory Function Stable, Patent Airway, No signs of Nausea or vomiting and Pain level controlled  Post-op Vital Signs: Reviewed and stable  Last Vitals:  Filed Vitals:   01/07/14 0745  BP: 143/62  Pulse:   Temp:   Resp: 13    Complications: No apparent anesthesia complications

## 2014-01-07 NOTE — Anesthesia Procedure Notes (Signed)
Procedure Name: MAC Date/Time: 01/07/2014 7:53 AM Performed by: Andree Elk, Sherryl Valido A Pre-anesthesia Checklist: Patient identified, Timeout performed, Emergency Drugs available, Suction available and Patient being monitored Oxygen Delivery Method: Nasal cannula

## 2014-01-07 NOTE — H&P (Signed)
I have reviewed the H&P, the patient was re-examined, and I have identified no interval changes in medical condition and plan of care since the history and physical of record  

## 2014-01-07 NOTE — Discharge Instructions (Signed)

## 2014-01-08 ENCOUNTER — Encounter (HOSPITAL_COMMUNITY): Payer: Self-pay | Admitting: Ophthalmology

## 2014-01-11 ENCOUNTER — Ambulatory Visit (HOSPITAL_COMMUNITY): Payer: Medicare Other

## 2014-01-21 ENCOUNTER — Other Ambulatory Visit (HOSPITAL_COMMUNITY): Payer: Self-pay

## 2014-01-21 ENCOUNTER — Ambulatory Visit (HOSPITAL_COMMUNITY)
Admission: RE | Admit: 2014-01-21 | Discharge: 2014-01-21 | Disposition: A | Discharge status: Home / Self Care | Payer: Medicare Other | Source: Ambulatory Visit | Attending: Oncology | Admitting: Oncology

## 2014-01-21 ENCOUNTER — Other Ambulatory Visit (HOSPITAL_COMMUNITY): Payer: Self-pay | Admitting: Oncology

## 2014-01-21 ENCOUNTER — Other Ambulatory Visit (HOSPITAL_COMMUNITY): Payer: Medicare Other

## 2014-01-21 DIAGNOSIS — K8020 Calculus of gallbladder without cholecystitis without obstruction: Secondary | ICD-10-CM | POA: Insufficient documentation

## 2014-01-21 DIAGNOSIS — R161 Splenomegaly, not elsewhere classified: Secondary | ICD-10-CM

## 2014-01-21 DIAGNOSIS — C9110 Chronic lymphocytic leukemia of B-cell type not having achieved remission: Secondary | ICD-10-CM | POA: Diagnosis not present

## 2014-01-21 DIAGNOSIS — K4020 Bilateral inguinal hernia, without obstruction or gangrene, not specified as recurrent: Secondary | ICD-10-CM | POA: Diagnosis not present

## 2014-01-21 DIAGNOSIS — N200 Calculus of kidney: Secondary | ICD-10-CM | POA: Diagnosis not present

## 2014-01-21 DIAGNOSIS — D696 Thrombocytopenia, unspecified: Secondary | ICD-10-CM | POA: Insufficient documentation

## 2014-01-23 ENCOUNTER — Ambulatory Visit (INDEPENDENT_AMBULATORY_CARE_PROVIDER_SITE_OTHER): Payer: Medicare Other | Admitting: Cardiology

## 2014-01-23 ENCOUNTER — Encounter: Payer: Self-pay | Admitting: Cardiology

## 2014-01-23 VITALS — BP 106/58 | HR 59 | Ht 70.0 in | Wt 220.0 lb

## 2014-01-23 DIAGNOSIS — E782 Mixed hyperlipidemia: Secondary | ICD-10-CM

## 2014-01-23 DIAGNOSIS — I2510 Atherosclerotic heart disease of native coronary artery without angina pectoris: Secondary | ICD-10-CM

## 2014-01-23 DIAGNOSIS — E785 Hyperlipidemia, unspecified: Secondary | ICD-10-CM

## 2014-01-23 DIAGNOSIS — I359 Nonrheumatic aortic valve disorder, unspecified: Secondary | ICD-10-CM

## 2014-01-23 DIAGNOSIS — I480 Paroxysmal atrial fibrillation: Secondary | ICD-10-CM

## 2014-01-23 MED ORDER — SIMVASTATIN 20 MG PO TABS: 20.0000 mg | ORAL_TABLET | Freq: Every day | ORAL | Status: DC

## 2014-01-23 NOTE — Progress Notes (Signed)
Reason for visit: CAD, atrial fibrillation, status post AVR  Clinical Summary [name] is a 68 y.o.male last seen in April. He reports no angina symptoms, stable NYHA class II dyspnea on exertion, no palpitations.  Labwork from September showed hemoglobin 10.9, platelets 100, potassium 4.1, BUN 10, creatinine 1.4. Last lipid panel per [name] back in March showed cholesterol 315 with LDL 249. He states that he was not able to tolerate Lipitor, or more recently Crestor. States he did not feel well on these medications, somewhat achy. He states that he has not tried Zocor.  Last echocardiogram from August 2011 showed mild LVH with LVEF 60-65%, normally functioning bioprosthetic AVR, significant MAC with trivial mitral regurgitation, mild left atrial enlargement, PASP 35 mm mercury. We talked about getting a follow-up study.   No Known Allergies  Current Outpatient Prescriptions  Medication Sig Dispense Refill  . acetaminophen (TYLENOL) 500 MG tablet Take 1,000 mg by mouth every 6 (six) hours as needed for fever.       Marland Kitchen acyclovir (ZOVIRAX) 200 MG capsule Take 1 capsule (200 mg total) by mouth 2 (two) times daily.  60 capsule  5  . aspirin EC 81 MG tablet Take 81 mg by mouth daily.      . folic acid (FOLVITE) 1 MG tablet Take 1 tablet (1 mg total) by mouth daily.  30 tablet  5  . metoprolol (LOPRESSOR) 50 MG tablet Take 1 tablet (50 mg total) by mouth 2 (two) times daily.  60 tablet  3  . Multiple Vitamins-Minerals (CENTRUM SILVER ULTRA MENS PO) Take 1 tablet by mouth every morning.       Marland Kitchen OMEGA 3 1000 MG CAPS Take 1,000 mg by mouth daily.       Marland Kitchen omeprazole (PRILOSEC) 20 MG capsule Take 20 mg by mouth every morning.       . sulfamethoxazole-trimethoprim (BACTRIM DS) 800-160 MG per tablet Take 1 tablet by mouth every Monday, Wednesday, and Friday.      . simvastatin (ZOCOR) 20 MG tablet Take 1 tablet (20 mg total) by mouth at bedtime.  30 tablet  6   No current facility-administered  medications for this visit.    Past Medical History  Diagnosis Date  . Aortic stenosis   . Atrial fibrillation   . Coronary atherosclerosis of native coronary artery   . Hyperlipidemia   . Essential hypertension, benign   . BPH (benign prostatic hypertrophy)   . Arthritis   . Acid reflux   . Hypogammaglobulinemia   . Pre-diabetes 08/04/2011  . CLL (chronic lymphoblastic leukemia)   . Hodgkin's disease   . Kidney stones   . Wears glasses   . Wears dentures   . HOH (hard of hearing)   . Indirect inguinal hernia 04/13/2013    Past Surgical History  Procedure Laterality Date  . Eye surgery    . Aortic valve replacement  10/09    41mm Magna Pericardial   . Esophagogastroduodenoscopy      scrapping of throat and stretching  . Lithotripsy    . Bone marrow aspiration    . Portacath placement    . 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 Colon surgery    . Dental surgery  08/2012  . Multiple tooth extractions    . Mass excision N/A 12/05/2012    Procedure: EXCISION MIDLINE NECK MASS;  Surgeon: Ascencion Dike, MD;  Location: [facility];  Service:  ENT;  Laterality: N/A;  . Tongue biopsy  12/2012  . Cataract extraction w/phaco Right 01/07/2014    Procedure: CATARACT EXTRACTION PHACO AND INTRAOCULAR LENS PLACEMENT (IOC);  Surgeon: Tonny Branch, MD;  Location: [facility];  Service: Ophthalmology;  Laterality: Right;  CDE:15.31    Social History [name] reports that he has never smoked. He has never used smokeless tobacco. [name] reports that he does not drink alcohol.  Review of Systems Complete review of systems negative except as otherwise outlined in the clinical summary and also the following. No dizziness or syncope. No orthopnea or PND. No fevers or chills.  Physical Examination Filed Vitals:   01/23/14 1355  BP: 106/58  Pulse: 59   Filed Weights   01/23/14 1355  Weight: 220 lb (99.791 kg)    Chronically ill-appearing, no distress.  HEENT:  Conjunctiva and lids normal, oropharynx with poor dentition.  Neck: Supple, no elevated jugular venous pressure or bruits.  Lungs: Clear to auscultation, nonlabored.  Cardiac: Regular rate and rhythm, S4, 2/6 systolic murmur at the base, no S3.  Abdomen: Obese, nontender, bowel sounds present.  Extremities: Venous stasis noted, 1-2+ edema, distal pulses diminished.  Skin: Warm and dry.  Musculoskeletal: No gross deformities.  Neuropsychiatric: Alert and oriented x3, affect grossly normal.   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 Follow-up echocardiogram will be obtained, last evaluation in 2011.  CORONARY ATHEROSCLEROSIS NATIVE CORONARY ARTERY No active angina symptoms with history of SVG to RCA in 2009. No clear indication for follow-up ischemic testing now, although we will begin discussing this over the next year.  Atrial fibrillation He is in sinus rhythm on examination today. Continue aspirin for now. With medical complexity and increased risk of bleeding we have not placed him on Coumadin.  Mixed hyperlipidemia We will try Zocor 20 mg daily. If he tolerates, follow-up FLP and LFTs in 8 weeks.    Satira Sark, M.D., F.A.C.C.

## 2014-01-23 NOTE — Assessment & Plan Note (Signed)
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 Follow-up echocardiogram will be obtained, last evaluation in 2011.

## 2014-01-23 NOTE — Assessment & Plan Note (Signed)
He is in sinus rhythm on examination today. Continue aspirin for now. With medical complexity and increased risk of bleeding we have not placed him on Coumadin.

## 2014-01-23 NOTE — Assessment & Plan Note (Signed)
We will try Zocor 20 mg daily. If he tolerates, follow-up FLP and LFTs in 8 weeks.

## 2014-01-23 NOTE — Assessment & Plan Note (Signed)
No active angina symptoms with history of SVG to RCA in 2009. No clear indication for follow-up ischemic testing now, although we will begin discussing this over the next year.

## 2014-01-23 NOTE — Patient Instructions (Signed)
Your physician wants you to follow-up in: 6 months You will receive a reminder letter in the mail two months in advance. If you don't receive a letter, please call our office to schedule the follow-up appointment.    Your physician has recommended you make the following change in your medication:     START Zocor 20 mg daily at night  Please get blood work (LIPID,LFT's) in 8 weeks    Your physician has requested that you have an echocardiogram. Echocardiography is a painless test that uses sound waves to create images of your heart. It provides your doctor with information about the size and shape of your heart and how well your heart's chambers and valves are working. This procedure takes approximately one hour. There are no restrictions for this procedure.        Thank you for choosing [facility] !

## 2014-01-24 ENCOUNTER — Ambulatory Visit (HOSPITAL_COMMUNITY)
Admission: RE | Admit: 2014-01-24 | Discharge: 2014-01-24 | Disposition: A | Discharge status: Home / Self Care | Payer: Medicare Other | Source: Ambulatory Visit | Attending: Cardiology | Admitting: Cardiology

## 2014-01-24 DIAGNOSIS — I059 Rheumatic mitral valve disease, unspecified: Secondary | ICD-10-CM

## 2014-01-24 DIAGNOSIS — I4891 Unspecified atrial fibrillation: Secondary | ICD-10-CM | POA: Diagnosis not present

## 2014-01-24 DIAGNOSIS — I359 Nonrheumatic aortic valve disorder, unspecified: Secondary | ICD-10-CM

## 2014-01-24 DIAGNOSIS — I10 Essential (primary) hypertension: Secondary | ICD-10-CM | POA: Diagnosis not present

## 2014-01-24 NOTE — Progress Notes (Signed)
*  PRELIMINARY RESULTS* Echocardiogram 2D Echocardiogram has been performed.  Leavy [name] 01/24/2014, 3:04 PM

## 2014-01-25 ENCOUNTER — Telehealth: Payer: Self-pay | Admitting: *Deleted

## 2014-01-25 NOTE — Telephone Encounter (Signed)
Spoke with pt,results given,copy to pcp

## 2014-01-25 NOTE — Telephone Encounter (Signed)
Pt calling back about test results. Returning call from yesterday

## 2014-01-25 NOTE — Telephone Encounter (Signed)
Returned call, left message. 

## 2014-01-30 ENCOUNTER — Encounter (HOSPITAL_COMMUNITY): Payer: Medicare Other

## 2014-02-04 ENCOUNTER — Other Ambulatory Visit: Payer: Self-pay | Admitting: Cardiology

## 2014-02-06 ENCOUNTER — Ambulatory Visit (HOSPITAL_COMMUNITY): Payer: Medicare Other

## 2014-02-06 ENCOUNTER — Other Ambulatory Visit (HOSPITAL_COMMUNITY): Payer: Medicare Other

## 2014-02-06 NOTE — Progress Notes (Signed)
This encounter was created in error - please disregard.

## 2014-02-07 ENCOUNTER — Other Ambulatory Visit (HOSPITAL_COMMUNITY): Payer: Medicare Other

## 2014-02-07 HISTORY — PX: EYE SURGERY: SHX253

## 2014-02-08 ENCOUNTER — Encounter (HOSPITAL_COMMUNITY): Payer: Medicare Other

## 2014-02-08 ENCOUNTER — Encounter (HOSPITAL_COMMUNITY): Payer: Self-pay

## 2014-02-08 ENCOUNTER — Encounter (HOSPITAL_COMMUNITY): Payer: Medicare Other | Attending: Oncology

## 2014-02-08 DIAGNOSIS — Z8571 Personal history of Hodgkin lymphoma: Secondary | ICD-10-CM

## 2014-02-08 DIAGNOSIS — Z23 Encounter for immunization: Secondary | ICD-10-CM | POA: Insufficient documentation

## 2014-02-08 DIAGNOSIS — D61818 Other pancytopenia: Secondary | ICD-10-CM

## 2014-02-08 DIAGNOSIS — C8190 Hodgkin lymphoma, unspecified, unspecified site: Secondary | ICD-10-CM | POA: Diagnosis present

## 2014-02-08 DIAGNOSIS — C9110 Chronic lymphocytic leukemia of B-cell type not having achieved remission: Secondary | ICD-10-CM | POA: Insufficient documentation

## 2014-02-08 DIAGNOSIS — D804 Selective deficiency of immunoglobulin M [IgM]: Secondary | ICD-10-CM

## 2014-02-08 LAB — COMPREHENSIVE METABOLIC PANEL
ALK PHOS: 116 U/L (ref 39–117)
ALT: 18 U/L (ref 0–53)
AST: 23 U/L (ref 0–37)
Albumin: 3.6 g/dL (ref 3.5–5.2)
Anion gap: 11 (ref 5–15)
BUN: 18 mg/dL (ref 6–23)
CALCIUM: 8.9 mg/dL (ref 8.4–10.5)
CO2: 26 meq/L (ref 19–32)
Chloride: 103 mEq/L (ref 96–112)
Creatinine, Ser: 1.59 mg/dL — ABNORMAL HIGH (ref 0.50–1.35)
GFR, EST AFRICAN AMERICAN: 49 mL/min — AB (ref >90)
GFR, EST NON AFRICAN AMERICAN: 43 mL/min — AB (ref >90)
GLUCOSE: 90 mg/dL (ref 70–99)
Potassium: 4.5 mEq/L (ref 3.7–5.3)
SODIUM: 140 meq/L (ref 137–147)
Total Bilirubin: 0.4 mg/dL (ref 0.3–1.2)
Total Protein: 6 g/dL (ref 6.0–8.3)

## 2014-02-08 LAB — CBC WITH DIFFERENTIAL/PLATELET
Basophils Absolute: 0.1 10*3/uL (ref 0.0–0.1)
Basophils Relative: 1 % (ref 0–1)
EOS ABS: 0.7 10*3/uL (ref 0.0–0.7)
EOS PCT: 9 % — AB (ref 0–5)
HCT: 33.1 % — ABNORMAL LOW (ref 39.0–52.0)
Hemoglobin: 11.1 g/dL — ABNORMAL LOW (ref 13.0–17.0)
LYMPHS ABS: 4.6 10*3/uL — AB (ref 0.7–4.0)
Lymphocytes Relative: 61 % — ABNORMAL HIGH (ref 12–46)
MCH: 32.4 pg (ref 26.0–34.0)
MCHC: 33.5 g/dL (ref 30.0–36.0)
MCV: 96.5 fL (ref 78.0–100.0)
Monocytes Absolute: 0.9 10*3/uL (ref 0.1–1.0)
Monocytes Relative: 13 % — ABNORMAL HIGH (ref 3–12)
Neutro Abs: 1.3 10*3/uL — ABNORMAL LOW (ref 1.7–7.7)
Neutrophils Relative %: 17 % — ABNORMAL LOW (ref 43–77)
Platelets: 134 10*3/uL — ABNORMAL LOW (ref 150–400)
RBC: 3.43 MIL/uL — AB (ref 4.22–5.81)
RDW: 16.1 % — AB (ref 11.5–15.5)
WBC: 7.5 10*3/uL (ref 4.0–10.5)

## 2014-02-08 LAB — LACTATE DEHYDROGENASE: LDH: 269 U/L — ABNORMAL HIGH (ref 94–250)

## 2014-02-08 LAB — RETICULOCYTES
RBC.: 3.43 MIL/uL — ABNORMAL LOW (ref 4.22–5.81)
Retic Count, Absolute: 48 10*3/uL (ref 19.0–186.0)
Retic Ct Pct: 1.4 % (ref 0.4–3.1)

## 2014-02-08 MED ORDER — SODIUM CHLORIDE 0.9 % IJ SOLN
10.0000 mL | INTRAMUSCULAR | Status: DC | PRN
  Administered 2014-02-08: 10 mL via INTRAVENOUS
  Filled 2014-02-08: qty 10

## 2014-02-08 MED ORDER — HEPARIN SOD (PORK) LOCK FLUSH 100 UNIT/ML IV SOLN
500.0000 [IU] | Freq: Once | INTRAVENOUS | Status: AC
  Administered 2014-02-08: 500 [IU] via INTRAVENOUS
  Filled 2014-02-08: qty 5

## 2014-02-08 NOTE — Patient Instructions (Signed)
[facility] Discharge Instructions  RECOMMENDATIONS MADE BY THE CONSULTANT AND ANY TEST RESULTS WILL BE SENT TO YOUR REFERRING PHYSICIAN.  EXAM FINDINGS BY THE PHYSICIAN TODAY AND SIGNS OR SYMPTOMS TO REPORT TO CLINIC OR PRIMARY PHYSICIAN: Exam and findings as discussed by Dr. Barnet Glasgow.  Think about getting monthly IVIG.  Might help prevent infections.  Let us know after you get back from [place]. Report fevers, chills, night sweats, unexplained weight loss or other concerns.    INSTRUCTIONS/FOLLOW-UP: Port flushes every 6 weeks and labs and office visit in 3 months.  Thank you for choosing [facility] to provide your oncology and hematology care.  To afford each patient quality time with our providers, please arrive at least 15 minutes before your scheduled appointment time.  With your help, our goal is to use those 15 minutes to complete the necessary work-up to ensure our physicians have the information they need to help with your evaluation and healthcare recommendations.    Effective January 1st, 2014, we ask that you re-schedule your appointment with our physicians should you arrive 10 or more minutes late for your appointment.  We strive to give you quality time with our providers, and arriving late affects you and other patients whose appointments are after yours.    Again, thank you for choosing [facility].  Our hope is that these requests will decrease the amount of time that you wait before being seen by our physicians.       _____________________________________________________________  Should you have questions after your visit to [facility], please contact our office at [phone] between the hours of 8:30 a.m. and 4:30 p.m.  Voicemails left after 4:30 p.m. will not be returned until the following business day.  For prescription refill requests, have your pharmacy contact our office with your prescription refill  request.    _______________________________________________________________  We hope that we have given you very good care.  You may receive a patient satisfaction survey in the mail, please complete it and return it as soon as possible.  We value your feedback!  _______________________________________________________________  Have you asked about our STAR program?  STAR stands for Survivorship Training and Rehabilitation, and this is a nationally recognized cancer care program that focuses on survivorship and rehabilitation.  Cancer and cancer treatments may cause problems, such as, pain, making you feel tired and keeping you from doing the things that you need or want to do. Cancer rehabilitation can help. Our goal is to reduce these troubling effects and help you have the best quality of life possible.  You may receive a survey from a nurse that asks questions about your current state of health.  Based on the survey results, all eligible patients will be referred to the [facility] program for an evaluation so we can better serve you!  A frequently asked questions sheet is available upon request.  Immune Globulin Injection What is this medicine? IMMUNE GLOBULIN (im MUNE GLOB yoo lin) helps to prevent or reduce the severity of certain infections in patients who are at risk. This medicine is collected from the pooled blood of many donors. It is used to treat immune system problems, thrombocytopenia, and Kawasaki syndrome. This medicine may be used for other purposes; ask your health care provider or pharmacist if you have questions. COMMON BRAND NAME(S): Baygam, BIVIGAM, Carimune, Carimune NF, Flebogamma, Flebogamma DIF, GamaSTAN S/D, Gamimune N, Gammagard S/D, Gammaked, Gammaplex, Gammar-P IV, Gamunex, Gamunex-C, Hizentra, Iveegam,  Iveegam EN, Octagam, Panglobulin, Panglobulin NF, Polygam S/D, Privigen, Sandoglobulin, Venoglobulin-S, Vigam, Vivaglobulin What should I tell my health care provider before  I take this medicine? They need to know if you have any of these conditions: - diabetes - extremely low or no immune antibodies in the blood - heart disease - history of blood clots - hyperprolinemia - infection in the blood, sepsis - kidney disease - taking medicine that may change kidney function - ask your health care provider about your medicine - an unusual or allergic reaction to human immune globulin, albumin, maltose, sucrose, polysorbate 80, other medicines, foods, dyes, or preservatives - pregnant or trying to get pregnant - breast-feeding How should I use this medicine? This medicine is for injection into a muscle or infusion into a vein or skin. It is usually given by a health care professional in a hospital or clinic setting. In rare cases, some brands of this medicine might be given at home. You will be taught how to give this medicine. Use exactly as directed. Take your medicine at regular intervals. Do not take your medicine more often than directed. Talk to your pediatrician regarding the use of this medicine in children. Special care may be needed. Overdosage: If you think you have taken too much of this medicine contact a poison control center or emergency room at once. NOTE: This medicine is only for you. Do not share this medicine with others. What if I miss a dose? It is important not to miss your dose. Call your doctor or health care professional if you are unable to keep an appointment. If you give yourself the medicine and you miss a dose, take it as soon as you can. If it is almost time for your next dose, take only that dose. Do not take double or extra doses. What may interact with this medicine? -aspirin and aspirin-like medicines -cisplatin -cyclosporine -medicines for infection like acyclovir, adefovir, amphotericin B, bacitracin, cidofovir, foscarnet, ganciclovir, gentamicin, pentamidine, vancomycin -NSAIDS, medicines for pain and inflammation,  like ibuprofen or naproxen -pamidronate -vaccines -zoledronic acid This list may not describe all possible interactions. Give your health care provider a list of all the medicines, herbs, non-prescription drugs, or dietary supplements you use. Also tell them if you smoke, drink alcohol, or use illegal drugs. Some items may interact with your medicine. What should I watch for while using this medicine? Your condition will be monitored carefully while you are receiving this medicine. This medicine is made from pooled blood donations of many different people. It may be possible to pass an infection in this medicine. However, the donors are screened for infections and all products are tested for HIV and hepatitis. The medicine is treated to kill most or all bacteria and viruses. Talk to your doctor about the risks and benefits of this medicine. Do not have vaccinations for at least 14 days before, or until at least 3 months after receiving this medicine. What side effects may I notice from receiving this medicine? Side effects that you should report to your doctor or health care professional as soon as possible: -allergic reactions like skin rash, itching or hives, swelling of the face, lips, or tongue -breathing problems -chest pain or tightness -fever, chills -headache with nausea, vomiting -neck pain or difficulty moving neck -pain when moving eyes -pain, swelling, warmth in the leg -problems with balance, talking, walking -sudden weight gain -swelling of the ankles, feet, hands -trouble passing urine or change in the amount of urine Side  effects that usually do not require medical attention (report to your doctor or health care professional if they continue or are bothersome): -dizzy, drowsy -flushing -increased sweating -leg cramps -muscle aches and pains -pain at site where injected This list may not describe all possible side effects. Call your doctor for medical advice about side  effects. You may report side effects to FDA at 1-800-FDA-1088. Where should I keep my medicine? Keep out of the reach of children. This drug is usually given in a hospital or clinic and will not be stored at home. In rare cases, some brands of this medicine may be given at home. If you are using this medicine at home, you will be instructed on how to store this medicine. Throw away any unused medicine after the expiration date on the label. NOTE: This sheet is a summary. It may not cover all possible information. If you have questions about this medicine, talk to your doctor, pharmacist, or health care provider.  2015, Elsevier/Gold Standard. (2008-06-05 11:44:49)

## 2014-02-08 NOTE — Progress Notes (Signed)
[facility]  OFFICE PROGRESS NOTE  [name], MD [address], [address] [place] [address]  DIAGNOSIS: CLL (chronic lymphocytic leukemia) - Plan: CBC with Differential, Reticulocytes, Comprehensive metabolic panel, Lactate dehydrogenase, Beta 2 microglobulin, serum  History of Hodgkin's disease - Plan: Lactate dehydrogenase, Beta 2 microglobulin, serum  Chief Complaint  Patient presents with  . CLL  . 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  INTERVAL HISTORY: HAYK DIVIS 68 y.o. male returns for follow-up after repeat CT scans were done on 01/03/2014. He is using half strength peroxide rinses after brushing his teeth and has had no recurrence of aphthous ulcers. Appetite is good with no nausea, vomiting, fever, night sweats, but with occasional cough without expectoration. He denies any lower extremity swelling or redness, diarrhea, constipation, dysuria, hematuria, urinary hesitancy, skin rash, headache, or seizures.  MEDICAL HISTORY: Past Medical History  Diagnosis Date  . Aortic stenosis   . Atrial fibrillation   . Coronary atherosclerosis of native coronary artery   . Hyperlipidemia   . Essential hypertension, benign   . BPH (benign prostatic hypertrophy)   . Arthritis   . Acid reflux   . Hypogammaglobulinemia   . Pre-diabetes 08/04/2011  . CLL (chronic lymphoblastic leukemia)   . Hodgkin's disease   . Kidney stones   . Wears glasses   . Wears dentures   . HOH (hard of hearing)   . Indirect inguinal hernia 04/13/2013    INTERIM HISTORY: has Hodgkin's disease; CLL; Mixed hyperlipidemia; OBESITY; CORONARY ATHEROSCLEROSIS NATIVE CORONARY ARTERY; Aortic valve disorder; Atrial fibrillation; OTHER DYSPHAGIA; AORTIC VALVE REPLACEMENT, HX OF; Essential hypertension, benign;  GERD (gastroesophageal reflux disease); Thrombocytopenia; Hypogammaglobulinemia, acquired; Anemia; Pre-diabetes; Dental infection; Piles (hemorrhoids); Reduced vision; Routine general medical examination at a health care facility; Neck mass; Cholelithiasis; Indirect inguinal hernia; Oral thrush; Mourning; Gingivitis, acute; Oral ulcer; and Acute bronchitis on his problem list.     Hodgkin's disease   10/02/2007 Initial Diagnosis HODGKIN'S DISEASE   From 10/13/2011 until 03/17/2012 he received 6 cycles of bendamustine/Rituxan followed by one additional dose of maintenance Rituxan on 07/18/2012 for CLL. Initial WBC count was 236,900.  ALLERGIES:  has No Known Allergies.  MEDICATIONS: has a current medication list which includes the following prescription(s): acetaminophen, acyclovir, aspirin ec, folic acid, metoprolol, multiple vitamins-minerals, omega 3, omeprazole, simvastatin, and sulfamethoxazole-trimethoprim, and the following Facility-Administered Medications: sodium chloride.  SURGICAL HISTORY:  Past Surgical History  Procedure Laterality Date  . Eye surgery    . Aortic valve replacement  10/09    47mm Magna Pericardial   . Esophagogastroduodenoscopy      scrapping of throat and stretching  . Lithotripsy    . Bone marrow aspiration    . Portacath placement    . 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 Colon surgery    . Dental surgery  08/2012  . Multiple tooth extractions    . Mass excision N/A 12/05/2012    Procedure: EXCISION MIDLINE NECK MASS;  Surgeon: Ascencion Dike, MD;  Location: [facility];  Service: ENT;  Laterality: N/A;  . Tongue biopsy  12/2012  . Cataract extraction w/phaco Right 01/07/2014    Procedure: CATARACT EXTRACTION PHACO AND INTRAOCULAR LENS PLACEMENT (IOC);  Surgeon: Tonny [facility], MD;  Location:  [facility];  Service: Ophthalmology;  Laterality: Right;  CDE:15.31    FAMILY HISTORY: family history includes Blindness in his mother; Cancer in his  father; Heart failure in his mother.  SOCIAL HISTORY:  reports that he has never smoked. He has never used smokeless tobacco. He reports that he does not drink alcohol or use illicit drugs.  REVIEW OF SYSTEMS:  Other than that discussed above is noncontributory.  PHYSICAL EXAMINATION: ECOG PERFORMANCE STATUS: 1 - Symptomatic but completely ambulatory  Blood pressure 141/65, pulse 66, temperature 97.9 F (36.6 C), temperature source Oral, resp. rate 16, weight 219 lb 8 oz (99.565 kg), SpO2 100 %.  GENERAL:alert, no distress and comfortable SKIN: skin color, texture, turgor are normal, no rashes or significant lesions EYES: PERLA; Conjunctiva are pink and non-injected, sclera clear SINUSES: No redness or tenderness over maxillary or ethmoid sinuses OROPHARYNX:no exudate, no erythema on lips, buccal mucosa, or tongue. NECK: supple, thyroid normal size, non-tender, without nodularity. No masses CHEST: increased AP diameter with light port in place. No gynecomastia. LYMPH:  no palpable lymphadenopathy in the cervical, axillary or inguinal LUNGS: clear to auscultation and percussion with normal breathing effort HEART: regular rate & rhythm and no murmurs. ABDOMEN:abdomen soft, non-tender and normal bowel sounds. Spleen tip not appreciated. MUSCULOSKELETAL:no cyanosis of digits and no clubbing. Range of motion normal.  NEURO: alert & oriented x 3 with fluent speech, no focal motor/sensory deficits   LABORATORY DATA:  01/03/2014: IgG 117 with IgA 10 and IgM of 5   No visits with results within 30 Day(s) from this visit. Latest known visit with results is:  Admission on 01/07/2014, Discharged on 01/07/2014  Component Date Value Ref Range Status  . Glucose-Capillary 01/07/2014 107* 70 - 99 mg/dL Final    PATHOLOGY:  Urinalysis    Component Value Date/Time   COLORURINE YELLOW 08/28/2013 [place] 08/28/2013 1259   LABSPEC >1.030* 08/28/2013 1259   PHURINE 5.5  08/28/2013 1259   GLUCOSEU NEGATIVE 08/28/2013 1259   HGBUR NEGATIVE 08/28/2013 1259   BILIRUBINUR NEGATIVE 08/28/2013 1259   KETONESUR NEGATIVE 08/28/2013 1259   PROTEINUR NEGATIVE 08/28/2013 1259   UROBILINOGEN 0.2 08/28/2013 1259   NITRITE NEGATIVE 08/28/2013 1259   LEUKOCYTESUR NEGATIVE 08/28/2013 1259    RADIOGRAPHIC STUDIES: Ct Abdomen Pelvis Wo Contrast  01/21/2014   CLINICAL DATA:  Splenomegaly. Chronic lymphocytic leukemia. Thrombocytopenia.  EXAM: CT CHEST, ABDOMEN AND PELVIS WITHOUT CONTRAST  TECHNIQUE: Multidetector CT imaging of the chest, abdomen and pelvis was performed following the standard protocol without IV contrast.  COMPARISON:  09/21/2012  FINDINGS: CT CHEST FINDINGS  Mediastinum/Hilar Regions: Increased prominence of shotty mediastinal lymph nodes are seen, largest now measuring 10 mm on image 23 compared to 5 mm previously. No other pathologically enlarged lymph nodes identified.  Other Thoracic Lymphadenopathy:  None.  Lungs:  No pulmonary infiltrate or mass identified.  Pleura:  No evidence of effusion or mass.  Vascular/Cardiac:  No acute findings identified.  Other:  None.  Musculoskeletal:  No suspicious bone lesions identified.  CT ABDOMEN AND PELVIS FINDINGS  Hepatobiliary: No mass or other abnormality visualized on this non-contrast exam. Tiny calcified gallstones again noted, without evidence of cholecystitis.  Pancreas: No mass or inflammatory process visualized on this non-contrast exam.  Spleen:  Mild splenomegaly remains stable.  Adrenal Glands:  No mass identified.  Kidneys/Urinary tract: Tiny nonobstructive right renal calculi noted. No evidence of ureteral calculi or hydronephrosis.  Stomach/Bowel/Peritoneum:  Unremarkable.  Vascular/Lymphatic: Shotty less than 1  cm lymph nodes in the upper abdomen and retroperitoneum remains stable. A few shotty less than 1 cm bilateral iliac lymph nodes are also stable.  Reproductive:  No mass or other significant abnormality  noted.  Other: Bilateral inguinal hernias are again noted with the right inguinal hernia containing nondilated distal small bowel and cecum. Only fat seen within the left inguinal hernia.  Musculoskeletal:  No suspicious bone lesions identified.  IMPRESSION: Mild increase in prominence of shotty mediastinal lymph nodes, now measuring up to 10 mm. No significant change in shotty less than 10 mm lymph nodes within the abdomen and pelvis.  Stable mild splenomegaly.  Nonobstructive right renal calculi. No evidence of ureteral calculi or hydronephrosis.  Cholelithiasis.  No radiographic evidence of cholecystitis.  Bilateral inguinal hernias, right side larger than left, as described above.   Electronically Signed   By: Earle Gell M.D.   On: 01/21/2014 14:55   Ct Chest Wo Contrast  01/21/2014   CLINICAL DATA:  Splenomegaly. Chronic lymphocytic leukemia. Thrombocytopenia.  EXAM: CT CHEST, ABDOMEN AND PELVIS WITHOUT CONTRAST  TECHNIQUE: Multidetector CT imaging of the chest, abdomen and pelvis was performed following the standard protocol without IV contrast.  COMPARISON:  09/21/2012  FINDINGS: CT CHEST FINDINGS  Mediastinum/Hilar Regions: Increased prominence of shotty mediastinal lymph nodes are seen, largest now measuring 10 mm on image 23 compared to 5 mm previously. No other pathologically enlarged lymph nodes identified.  Other Thoracic Lymphadenopathy:  None.  Lungs:  No pulmonary infiltrate or mass identified.  Pleura:  No evidence of effusion or mass.  Vascular/Cardiac:  No acute findings identified.  Other:  None.  Musculoskeletal:  No suspicious bone lesions identified.  CT ABDOMEN AND PELVIS FINDINGS  Hepatobiliary: No mass or other abnormality visualized on this non-contrast exam. Tiny calcified gallstones again noted, without evidence of cholecystitis.  Pancreas: No mass or inflammatory process visualized on this non-contrast exam.  Spleen:  Mild splenomegaly remains stable.  Adrenal Glands:  No mass  identified.  Kidneys/Urinary tract: Tiny nonobstructive right renal calculi noted. No evidence of ureteral calculi or hydronephrosis.  Stomach/Bowel/Peritoneum:  Unremarkable.  Vascular/Lymphatic: Shotty less than 1 cm lymph nodes in the upper abdomen and retroperitoneum remains stable. A few shotty less than 1 cm bilateral iliac lymph nodes are also stable.  Reproductive:  No mass or other significant abnormality noted.  Other: Bilateral inguinal hernias are again noted with the right inguinal hernia containing nondilated distal small bowel and cecum. Only fat seen within the left inguinal hernia.  Musculoskeletal:  No suspicious bone lesions identified.  IMPRESSION: Mild increase in prominence of shotty mediastinal lymph nodes, now measuring up to 10 mm. No significant change in shotty less than 10 mm lymph nodes within the abdomen and pelvis.  Stable mild splenomegaly.  Nonobstructive right renal calculi. No evidence of ureteral calculi or hydronephrosis.  Cholelithiasis.  No radiographic evidence of cholecystitis.  Bilateral inguinal hernias, right side larger than left, as described above.   Electronically Signed   By: Earle Gell M.D.   On: 01/21/2014 14:55    ASSESSMENT:  #1. Chronic lymphocytic leukemia with minimal residual disease on bone marrow examination in October 2014, stable.  #2. Aphthous ulcers of the right buccal mucosa, upper lip, and posterior pharynx, not responsive to oral steroids, possibly Herpes stomatitis, resolved with Famvir plus peroxide rinses.  #3. Right inguinal hernia, easily reducible, not symptomatic.  #4. History of Hodgkin's disease, nodular sclerosis type, no evidence of disease.  #5. Pancytopenia secondary to  previous therapy. #6. Immunoglobulin deficiency.     PLAN:  #1. Recommend monthly IV IgG in an effort to decrease the likelihood of future infections. He intends to visit his son in [place] and will let us know whether he will comply when he gets  back. #2. Port flush in 6 weeks. #3. Follow-up in 3 months with CBC, chem profile, LDH, beta-2 microglobulin, and peripheral blood flow cytometry.   All questions were answered. The patient knows to call the clinic with any problems, questions or concerns. We can certainly see the patient much sooner if necessary.   I spent 25 minutes counseling the patient face to face. The total time spent in the appointment was 30 minutes.    Doroteo [facility], MD 02/08/2014 1:17 PM  DISCLAIMER:  This note was dictated with voice recognition software.  Similar sounding words can inadvertently be transcribed inaccurately and may not be corrected upon review.

## 2014-02-08 NOTE — Progress Notes (Signed)
Please see doctors visit for more information 

## 2014-02-08 NOTE — Progress Notes (Signed)
[name] presented for labwork. Labs per MD order drawn via Portacath located in the right chest wall accessed with  H 20 needle. Good blood return present. Procedure without incident.  Needle removed intact. Patient tolerated procedure well.

## 2014-02-11 LAB — BETA 2 MICROGLOBULIN, SERUM: Beta-2 Microglobulin: 6.55 mg/L — ABNORMAL HIGH (ref <=2.51)

## 2014-03-02 ENCOUNTER — Other Ambulatory Visit (HOSPITAL_COMMUNITY): Payer: Self-pay | Admitting: Hematology and Oncology

## 2014-03-04 ENCOUNTER — Other Ambulatory Visit (HOSPITAL_COMMUNITY): Payer: Self-pay | Admitting: Hematology and Oncology

## 2014-03-04 MED ORDER — SULFAMETHOXAZOLE-TRIMETHOPRIM 800-160 MG PO TABS: ORAL_TABLET | ORAL | Status: DC

## 2014-03-07 ENCOUNTER — Encounter: Payer: Self-pay | Admitting: Family Medicine

## 2014-03-07 ENCOUNTER — Ambulatory Visit (INDEPENDENT_AMBULATORY_CARE_PROVIDER_SITE_OTHER): Payer: Medicare Other | Admitting: Family Medicine

## 2014-03-07 VITALS — BP 120/70 | HR 73 | Resp 16 | Ht 70.0 in | Wt 227.4 lb

## 2014-03-07 DIAGNOSIS — Z23 Encounter for immunization: Secondary | ICD-10-CM | POA: Insufficient documentation

## 2014-03-07 DIAGNOSIS — J209 Acute bronchitis, unspecified: Secondary | ICD-10-CM

## 2014-03-07 DIAGNOSIS — Z0000 Encounter for general adult medical examination without abnormal findings: Secondary | ICD-10-CM

## 2014-03-07 DIAGNOSIS — H547 Unspecified visual loss: Secondary | ICD-10-CM

## 2014-03-07 MED ORDER — DOXYCYCLINE HYCLATE 100 MG PO TABS: 100.0000 mg | ORAL_TABLET | Freq: Two times a day (BID) | ORAL | Status: DC

## 2014-03-07 NOTE — Progress Notes (Signed)
Preventive Screening-Counseling & Management   Patient present here today for a Medicare annual wellness visit. 5 day h/o increased chest congestion with cough and sputum production which is thick and yellow. C/o open sore on upper neck, in the back where he has been scratching, no purulent drainage noted   Current Problems (verified)   Medications Prior to Visit Allergies (verified)   PAST HISTORY  Family History (verified)   Social History  Widowed (wife passed away Mar 20, 2013) and he has 2 sons. 1 son lives with him    Risk Factors  Current exercise habits:  Goes to the [facility] and goes swimming when he's able   Dietary issues discussed: Heart healthy diet discussed- does eat a lot of cheeseburgers and bacon and encouraged to cut back and eat more white meat and vegetables/fruit    Cardiac risk factors: Mother had heart failure- unsure of age or if she ever had MI   Depression Screen  (Note: if answer to either of the following is "Yes", a more complete depression screening is indicated)   Over the past two weeks, have you felt down, depressed or hopeless? No  Over the past two weeks, have you felt little interest or pleasure in doing things? No  Have you lost interest or pleasure in daily life? No  Do you often feel hopeless? No  Do you cry easily over simple problems? No   Activities of Daily Living  In your present state of health, do you have any difficulty performing the following activities?  Driving?: No Managing money?: No Feeding yourself?:No Getting from bed to chair?:No Climbing a flight of stairs?: has to take his time  Preparing food and eating?:No Bathing or showering?:No Getting dressed?:No Getting to the toilet?:No Using the toilet?:No Moving around from place to place?: No  Fall Risk Assessment In the past year have you fallen or had a near fall?:Not that he remembers- sometimes he trips because he doesn't lift his feet high enough when he walks ,  home safety discussed Are you currently taking any medications that make you dizzy?:No   Hearing Difficulties: No Do you often ask people to speak up or repeat themselves?:No Do you experience ringing or noises in your ears?:No Do you have difficulty understanding soft or whispered voices?:yes occasionally  Cognitive Testing  Alert? Yes Normal Appearance?Yes  Oriented to person? Yes Place? Yes  Time? Yes  Displays appropriate judgment?Yes  Can read the correct time from a watch face? yes Are you having problems remembering things? Sometimes he forgets certain things (Dr's appts, etc)   Advanced Directives have been discussed with the patient?Yes, brochure given, full code at this time   List the Names of Other Physician/Practitioners you currently use:  Dr Barnet Glasgow (oncology)   Indicate any recent Medical Services you may have received from other than [facility] providers in the past year (date may be approximate).   Assessment:    Annual Wellness Exam   Plan:    During the course of the visit the patient was educated and counseled about appropriate screening and preventive services including:  A healthy diet is rich in fruit, vegetables and whole grains. Poultry fish, nuts and beans are a healthy choice for protein rather then red meat. A low sodium diet and drinking 64 ounces of water daily is generally recommended. Oils and sweet should be limited. Carbohydrates especially for those who are diabetic or overweight, should be limited to 30-45 gram per meal. It is important to eat on a  regular schedule, at least 3 times daily. Snacks should be primarily fruits, vegetables or nuts. It is important that you exercise regularly at least 30 minutes 5 times a week. If you develop chest pain, have severe difficulty breathing, or feel very tired, stop exercising immediately and seek medical attention  Immunization reviewed and updated. Cancer screening reviewed and updated    Patient  Instructions (the written plan) was given to the patient.  Medicare Attestation  I have personally reviewed:  The patient's medical and social history  Their use of alcohol, tobacco or illicit drugs  Their current medications and supplements  The patient's functional ability including ADLs,fall risks, home safety risks, cognitive, and hearing and visual impairment  Diet and physical activities  Evidence for depression or mood disorders  The patient's weight, height, BMI, and visual acuity have been recorded in the chart. I have made referrals, counseling, and provided education to the patient based on review of the above and I have provided the patient with a written personalized care plan for preventive services.   Physical exam BP 120/70 mmHg  Pulse 73  Resp 16  Ht 5\' 10"  (1.778 m)  Wt 227 lb 6.4 oz (103.148 kg)  BMI 32.63 kg/m2  SpO2 99%  Patient alert and oriented and in no cardiopulmonary distress.    Chest: decreased air entry with right basilar crackles and bronchial breath sounds, no wheezesw  Skin: 1 cm open lesion on nape of neck, mildly erythematous with scant drainage   Need for Tdap vaccination Open sores in upper neck x 2, TdAP needed and administered  Medicare annual wellness visit, subsequent Annual exam as documented. Counseling done  re healthy lifestyle involving commitment to daily physical activity as able, heart healthy diet, and attaining healthy weight.The importance of adequate sleep also discussed. Regular seat belt use and home safety, is also discussed. Changes in health habits are decided on by the patient with goals and time frames  set for achieving them. Immunization and cancer screening needs are specifically addressed at this visit. Needs TdAP due to open skin lesion on posterior neck Poor vision , has cataract extraction upcoming   Reduced vision Plans to have surgical intervention in the near future on right eye  Acute  bronchitis Antibiotic and decongestant prescribed

## 2014-03-07 NOTE — Patient Instructions (Addendum)
F/u in 4 month, call if you need me before  Come for pneumonia vaccine first week in January please  You will get the tetanus vaccine TdAP today and medication is sent  To the pharmacy

## 2014-03-07 NOTE — Assessment & Plan Note (Signed)
Open sores in upper neck x 2, TdAP needed and administered

## 2014-03-19 ENCOUNTER — Encounter (HOSPITAL_COMMUNITY): Payer: Medicare Other | Attending: Oncology

## 2014-03-19 VITALS — BP 126/67 | HR 67 | Temp 97.9°F | Resp 18

## 2014-03-19 DIAGNOSIS — Z95828 Presence of other vascular implants and grafts: Secondary | ICD-10-CM

## 2014-03-19 DIAGNOSIS — Z452 Encounter for adjustment and management of vascular access device: Secondary | ICD-10-CM

## 2014-03-19 DIAGNOSIS — C9110 Chronic lymphocytic leukemia of B-cell type not having achieved remission: Secondary | ICD-10-CM

## 2014-03-19 DIAGNOSIS — Z23 Encounter for immunization: Secondary | ICD-10-CM | POA: Insufficient documentation

## 2014-03-19 DIAGNOSIS — C8190 Hodgkin lymphoma, unspecified, unspecified site: Secondary | ICD-10-CM | POA: Insufficient documentation

## 2014-03-19 MED ORDER — HEPARIN SOD (PORK) LOCK FLUSH 100 UNIT/ML IV SOLN
500.0000 [IU] | Freq: Once | INTRAVENOUS | Status: AC
  Administered 2014-03-19: 500 [IU] via INTRAVENOUS
  Filled 2014-03-19: qty 5

## 2014-03-19 MED ORDER — SODIUM CHLORIDE 0.9 % IJ SOLN
10.0000 mL | INTRAMUSCULAR | Status: DC | PRN
  Administered 2014-03-19: 10 mL via INTRAVENOUS
  Filled 2014-03-19: qty 10

## 2014-03-19 NOTE — Progress Notes (Signed)
[name] presented for Portacath access and flush.  .  Portacath located right chest wall accessed with  H 20 needle.  Good blood return present. Portacath flushed with 16ml NS and 500U/38ml Heparin and needle removed intact.  Procedure tolerated well and without incident.

## 2014-03-19 NOTE — Patient Instructions (Signed)
[facility] Discharge Instructions  RECOMMENDATIONS MADE BY THE CONSULTANT AND ANY TEST RESULTS WILL BE SENT TO YOUR REFERRING PHYSICIAN.  Your port was flushed today. Please follow up as scheduled every 6-8 weeks for port flushes. Please call for any questions or concerns.   Thank you for choosing [facility] to provide your oncology and hematology care.  To afford each patient quality time with our providers, please arrive at least 15 minutes before your scheduled appointment time.  With your help, our goal is to use those 15 minutes to complete the necessary work-up to ensure our physicians have the information they need to help with your evaluation and healthcare recommendations.    Effective January 1st, 2014, we ask that you re-schedule your appointment with our physicians should you arrive 10 or more minutes late for your appointment.  We strive to give you quality time with our providers, and arriving late affects you and other patients whose appointments are after yours.    Again, thank you for choosing [facility].  Our hope is that these requests will decrease the amount of time that you wait before being seen by our physicians.       _____________________________________________________________  Should you have questions after your visit to [facility], please contact our office at [phone] between the hours of 8:30 a.m. and 4:30 p.m.  Voicemails left after 4:30 p.m. will not be returned until the following business day.  For prescription refill requests, have your pharmacy contact our office with your prescription refill request.    _______________________________________________________________  We hope that we have given you very good care.  You may receive a patient satisfaction survey in the mail, please complete it and return it as soon as possible.  We value your  feedback!  _______________________________________________________________  Have you asked about our STAR program?  STAR stands for Survivorship Training and Rehabilitation, and this is a nationally recognized cancer care program that focuses on survivorship and rehabilitation.  Cancer and cancer treatments may cause problems, such as, pain, making you feel tired and keeping you from doing the things that you need or want to do. Cancer rehabilitation can help. Our goal is to reduce these troubling effects and help you have the best quality of life possible.  You may receive a survey from a nurse that asks questions about your current state of health.  Based on the survey results, all eligible patients will be referred to the [facility] program for an evaluation so we can better serve you!  A frequently asked questions sheet is available upon request.

## 2014-03-20 ENCOUNTER — Encounter (HOSPITAL_COMMUNITY)
Admission: RE | Admit: 2014-03-20 | Discharge: 2014-03-20 | Disposition: A | Discharge status: Home / Self Care | Payer: Medicare Other | Source: Ambulatory Visit | Attending: Ophthalmology | Admitting: Ophthalmology

## 2014-03-20 ENCOUNTER — Encounter (HOSPITAL_COMMUNITY): Payer: Self-pay

## 2014-03-24 ENCOUNTER — Encounter: Payer: Self-pay | Admitting: Family Medicine

## 2014-03-24 NOTE — Assessment & Plan Note (Signed)
Antibiotic and decongestant prescribed 

## 2014-03-24 NOTE — Assessment & Plan Note (Signed)
Annual exam as documented. Counseling done  re healthy lifestyle involving commitment to daily physical activity as able, heart healthy diet, and attaining healthy weight.The importance of adequate sleep also discussed. Regular seat belt use and home safety, is also discussed. Changes in health habits are decided on by the patient with goals and time frames  set for achieving them. Immunization and cancer screening needs are specifically addressed at this visit. Needs TdAP due to open skin lesion on posterior neck Poor vision , has cataract extraction upcoming

## 2014-03-24 NOTE — Assessment & Plan Note (Addendum)
Plans to have surgical intervention in the near future on right eye

## 2014-03-25 ENCOUNTER — Encounter (HOSPITAL_COMMUNITY): Payer: Medicare Other

## 2014-03-25 ENCOUNTER — Encounter (HOSPITAL_COMMUNITY)
Admission: RE | Disposition: A | Discharge status: Home / Self Care | Payer: Self-pay | Source: Ambulatory Visit | Attending: Ophthalmology

## 2014-03-25 ENCOUNTER — Ambulatory Visit (HOSPITAL_COMMUNITY): Payer: Medicare Other | Admitting: Anesthesiology

## 2014-03-25 ENCOUNTER — Encounter (HOSPITAL_COMMUNITY): Payer: Self-pay | Admitting: Ophthalmology

## 2014-03-25 ENCOUNTER — Ambulatory Visit (HOSPITAL_COMMUNITY)
Admission: RE | Admit: 2014-03-25 | Discharge: 2014-03-25 | Disposition: A | Discharge status: Home / Self Care | Payer: Medicare Other | Source: Ambulatory Visit | Attending: Ophthalmology | Admitting: Ophthalmology

## 2014-03-25 DIAGNOSIS — H25812 Combined forms of age-related cataract, left eye: Secondary | ICD-10-CM | POA: Diagnosis not present

## 2014-03-25 HISTORY — PX: CATARACT EXTRACTION W/PHACO: SHX586

## 2014-03-25 LAB — GLUCOSE, CAPILLARY: GLUCOSE-CAPILLARY: 91 mg/dL (ref 70–99)

## 2014-03-25 SURGERY — PHACOEMULSIFICATION, CATARACT, WITH IOL INSERTION
Anesthesia: Monitor Anesthesia Care | Site: Eye | Laterality: Left

## 2014-03-25 MED ORDER — LIDOCAINE HCL 3.5 % OP GEL
1.0000 "application " | Freq: Once | OPHTHALMIC | Status: AC
  Administered 2014-03-25: 1 via OPHTHALMIC

## 2014-03-25 MED ORDER — MIDAZOLAM HCL 2 MG/2ML IJ SOLN
INTRAMUSCULAR | Status: AC
Start: 2014-03-25 — End: 2014-03-25
  Filled 2014-03-25: qty 2

## 2014-03-25 MED ORDER — EPINEPHRINE HCL 1 MG/ML IJ SOLN
INTRAMUSCULAR | Status: AC
  Filled 2014-03-25: qty 1

## 2014-03-25 MED ORDER — MIDAZOLAM HCL 2 MG/2ML IJ SOLN
1.0000 mg | INTRAMUSCULAR | Status: DC | PRN
  Administered 2014-03-25: 2 mg via INTRAVENOUS

## 2014-03-25 MED ORDER — FENTANYL CITRATE 0.05 MG/ML IJ SOLN
INTRAMUSCULAR | Status: AC
  Filled 2014-03-25: qty 2

## 2014-03-25 MED ORDER — POVIDONE-IODINE 5 % OP SOLN
OPHTHALMIC | Status: DC | PRN
Start: 2014-03-25 — End: 2014-03-25
  Administered 2014-03-25: 1 via OPHTHALMIC

## 2014-03-25 MED ORDER — EPINEPHRINE HCL 1 MG/ML IJ SOLN
INTRAOCULAR | Status: DC | PRN
  Administered 2014-03-25: 500 mL

## 2014-03-25 MED ORDER — PROVISC 10 MG/ML IO SOLN
INTRAOCULAR | Status: DC | PRN
  Administered 2014-03-25: 0.85 mL via INTRAOCULAR

## 2014-03-25 MED ORDER — TETRACAINE HCL 0.5 % OP SOLN
1.0000 [drp] | OPHTHALMIC | Status: AC
  Administered 2014-03-25 (×3): 1 [drp] via OPHTHALMIC

## 2014-03-25 MED ORDER — BSS IO SOLN
INTRAOCULAR | Status: DC | PRN
  Administered 2014-03-25: 15 mL

## 2014-03-25 MED ORDER — PHENYLEPHRINE HCL 2.5 % OP SOLN
1.0000 [drp] | OPHTHALMIC | Status: AC
Start: 2014-03-25 — End: 2014-03-25
  Administered 2014-03-25 (×3): 1 [drp] via OPHTHALMIC

## 2014-03-25 MED ORDER — NEOMYCIN-POLYMYXIN-DEXAMETH 3.5-10000-0.1 OP SUSP
OPHTHALMIC | Status: DC | PRN
  Administered 2014-03-25: 2 [drp] via OPHTHALMIC

## 2014-03-25 MED ORDER — FENTANYL CITRATE 0.05 MG/ML IJ SOLN
25.0000 ug | INTRAMUSCULAR | Status: AC
  Administered 2014-03-25 (×2): 25 ug via INTRAVENOUS

## 2014-03-25 MED ORDER — LIDOCAINE HCL (PF) 1 % IJ SOLN
INTRAOCULAR | Status: DC | PRN
  Administered 2014-03-25: .7 mL via OPHTHALMIC

## 2014-03-25 MED ORDER — LACTATED RINGERS IV SOLN
INTRAVENOUS | Status: DC
  Administered 2014-03-25: 1000 mL via INTRAVENOUS

## 2014-03-25 MED ORDER — CYCLOPENTOLATE-PHENYLEPHRINE 0.2-1 % OP SOLN
1.0000 [drp] | OPHTHALMIC | Status: AC
  Administered 2014-03-25 (×3): 1 [drp] via OPHTHALMIC

## 2014-03-25 MED ORDER — LIDOCAINE 3.5 % OP GEL OPTIME - NO CHARGE
OPHTHALMIC | Status: DC | PRN
  Administered 2014-03-25: 1 [drp] via OPHTHALMIC

## 2014-03-25 SURGICAL SUPPLY — 33 items
CAPSULAR TENSION RING-AMO (OPHTHALMIC RELATED) IMPLANT
CLOTH BEACON ORANGE TIMEOUT ST (SAFETY) ×1 IMPLANT
EYE SHIELD UNIVERSAL CLEAR (GAUZE/BANDAGES/DRESSINGS) ×1 IMPLANT
GLOVE BIO SURGEON STRL SZ 6.5 (GLOVE) IMPLANT
GLOVE BIOGEL PI IND STRL 6.5 (GLOVE) IMPLANT
GLOVE BIOGEL PI IND STRL 7.0 (GLOVE) IMPLANT
GLOVE BIOGEL PI IND STRL 7.5 (GLOVE) IMPLANT
GLOVE BIOGEL PI INDICATOR 6.5 (GLOVE)
GLOVE BIOGEL PI INDICATOR 7.0 (GLOVE) ×2
GLOVE BIOGEL PI INDICATOR 7.5 (GLOVE)
GLOVE ECLIPSE 6.5 STRL STRAW (GLOVE) IMPLANT
GLOVE ECLIPSE 7.0 STRL STRAW (GLOVE) IMPLANT
GLOVE ECLIPSE 7.5 STRL STRAW (GLOVE) IMPLANT
GLOVE EXAM NITRILE LRG STRL (GLOVE) IMPLANT
GLOVE EXAM NITRILE MD LF STRL (GLOVE) IMPLANT
GLOVE SKINSENSE NS SZ6.5 (GLOVE)
GLOVE SKINSENSE NS SZ7.0 (GLOVE)
GLOVE SKINSENSE STRL SZ6.5 (GLOVE) IMPLANT
GLOVE SKINSENSE STRL SZ7.0 (GLOVE) IMPLANT
KIT VITRECTOMY (OPHTHALMIC RELATED) IMPLANT
PAD ARMBOARD 7.5X6 YLW CONV (MISCELLANEOUS) ×1 IMPLANT
PROC W NO LENS (INTRAOCULAR LENS)
PROC W SPEC LENS (INTRAOCULAR LENS)
PROCESS W NO LENS (INTRAOCULAR LENS) IMPLANT
PROCESS W SPEC LENS (INTRAOCULAR LENS) IMPLANT
RETRACTOR IRIS SIGHTPATH (OPHTHALMIC RELATED) IMPLANT
RING MALYGIN (MISCELLANEOUS) IMPLANT
SIGHTPATH CAT PROC W REG LENS (Ophthalmic Related) ×2 IMPLANT
SYRINGE LUER LOK 1CC (MISCELLANEOUS) ×1 IMPLANT
TAPE SURG TRANSPORE 1 IN (GAUZE/BANDAGES/DRESSINGS) IMPLANT
TAPE SURGICAL TRANSPORE 1 IN (GAUZE/BANDAGES/DRESSINGS) ×1
VISCOELASTIC ADDITIONAL (OPHTHALMIC RELATED) IMPLANT
WATER STERILE IRR 250ML POUR (IV SOLUTION) ×1 IMPLANT

## 2014-03-25 NOTE — Discharge Instructions (Signed)

## 2014-03-25 NOTE — H&P (Signed)
I have reviewed the H&P, the patient was re-examined, and I have identified no interval changes in medical condition and plan of care since the history and physical of record  

## 2014-03-25 NOTE — Op Note (Signed)
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  Assistants: None  Anesthesia: Topical with MAC  Indications: Painless, progressive loss of vision with compromise of daily activities.  Surgery: Cataract Extraction with Intraocular lens Implant Left Eye  Discription: The patient had dilating drops and viscous lidocaine placed into the Left eye in the pre-op holding area. After transfer to the operating room, a time out was performed. The patient was then prepped and draped. Beginning with a 42 degree blade a paracentesis port was made at the surgeon's 2 o'clock position. The anterior chamber was then filled with 1% non-preserved lidocaine. This was followed by filling the anterior chamber with Provisc.  A 2.85mm keratome blade was used to make a clear corneal incision at the temporal limbus.  A bent cystatome needle was used to create a continuous tear capsulotomy. Hydrodissection was performed with balanced salt solution on a Fine canula. The lens nucleus was then removed using the phacoemulsification handpiece. Residual cortex was removed with the I&A handpiece. The anterior chamber and capsular bag were refilled with Provisc. A posterior chamber intraocular lens was placed into the capsular bag with it's injector. The implant was positioned with the Kuglan hook. The Provisc was then removed from the anterior chamber and capsular bag with the I&A handpiece. Stromal hydration of the main incision and paracentesis port was performed with BSS on a Fine canula. The wounds were tested for leak which was negative. The patient tolerated the procedure well. There were no operative complications. The patient was then transferred to the recovery room in stable condition.  Complications: None  Specimen: None  EBL: None  Prosthetic device: Hoya iSert 250, power 20.5 D,  SN [id number].

## 2014-03-25 NOTE — Transfer of Care (Signed)
Immediate Anesthesia Transfer of Care Note  Patient: [name]  Procedure(s) Performed: Procedure(s) (LRB): CATARACT EXTRACTION PHACO AND INTRAOCULAR LENS PLACEMENT (IOC) (Left)  Patient Location: Shortstay  Anesthesia Type: MAC  Level of Consciousness: awake  Airway & Oxygen Therapy: Patient Spontanous Breathing   Post-op Assessment: Report given to PACU RN, Post -op Vital signs reviewed and stable and Patient moving all extremities  Post vital signs: Reviewed and stable  Complications: No apparent anesthesia complications

## 2014-03-25 NOTE — Anesthesia Procedure Notes (Signed)
Procedure Name: MAC Date/Time: 03/25/2014 10:49 AM Performed by: Vista Deck Pre-anesthesia Checklist: Patient identified, Emergency Drugs available, Suction available, Timeout performed and Patient being monitored Patient Re-evaluated:Patient Re-evaluated prior to inductionOxygen Delivery Method: Nasal Cannula

## 2014-03-25 NOTE — Anesthesia Postprocedure Evaluation (Signed)
  Anesthesia Post-op Note  Patient: [name]  Procedure(s) Performed: Procedure(s) (LRB): CATARACT EXTRACTION PHACO AND INTRAOCULAR LENS PLACEMENT (IOC) (Left)  Patient Location:  Short Stay  Anesthesia Type: MAC  Level of Consciousness: awake  Airway and Oxygen Therapy: Patient Spontanous Breathing  Post-op Pain: none  Post-op Assessment: Post-op Vital signs reviewed, Patient's Cardiovascular Status Stable, Respiratory Function Stable, Patent Airway, No signs of Nausea or vomiting and Pain level controlled  Post-op Vital Signs: Reviewed and stable  Complications: No apparent anesthesia complications

## 2014-03-25 NOTE — Anesthesia Preprocedure Evaluation (Signed)
Anesthesia Evaluation  Patient identified by MRN, date of birth, ID band Patient awake    Reviewed: Allergy & Precautions, H&P , NPO status , Patient's Chart, lab work & pertinent test results, reviewed documented beta blocker date and time   Airway Mallampati: I  TM Distance: >3 FB Neck ROM: Full    Dental  (+) Edentulous Upper, Poor Dentition, Chipped   Pulmonary  breath sounds clear to auscultation        Cardiovascular hypertension, Pt. on medications and Pt. on home beta blockers + CAD and + CABG + dysrhythmias + Valvular Problems/Murmurs AS Rhythm:Regular     Neuro/Psych    GI/Hepatic GERD-  Medicated and Controlled,  Endo/Other    Renal/GU      Musculoskeletal  (+) Arthritis -,   Abdominal   Peds  Hematology  (+) Blood dyscrasia (CLL), anemia ,   Anesthesia Other Findings   Reproductive/Obstetrics                             Anesthesia Physical Anesthesia Plan  ASA: III  Anesthesia Plan: MAC   Post-op Pain Management:    Induction: Intravenous  Airway Management Planned: Nasal Cannula  Additional Equipment:   Intra-op Plan:   Post-operative Plan:   Informed Consent: I have reviewed the patients History and Physical, chart, labs and discussed the procedure including the risks, benefits and alternatives for the proposed anesthesia with the patient or authorized representative who has indicated his/her understanding and acceptance.     Plan Discussed with:   Anesthesia Plan Comments:         Anesthesia Quick Evaluation

## 2014-03-26 ENCOUNTER — Encounter (HOSPITAL_COMMUNITY): Payer: Self-pay | Admitting: Ophthalmology

## 2014-04-02 ENCOUNTER — Ambulatory Visit (INDEPENDENT_AMBULATORY_CARE_PROVIDER_SITE_OTHER): Payer: Medicare Other

## 2014-04-02 ENCOUNTER — Other Ambulatory Visit (HOSPITAL_COMMUNITY): Payer: Self-pay | Admitting: Oncology

## 2014-04-02 VITALS — Wt 221.0 lb

## 2014-04-02 DIAGNOSIS — C8190 Hodgkin lymphoma, unspecified, unspecified site: Secondary | ICD-10-CM

## 2014-04-02 DIAGNOSIS — C9110 Chronic lymphocytic leukemia of B-cell type not having achieved remission: Secondary | ICD-10-CM

## 2014-04-02 DIAGNOSIS — Z23 Encounter for immunization: Secondary | ICD-10-CM

## 2014-04-02 DIAGNOSIS — D801 Nonfamilial hypogammaglobulinemia: Secondary | ICD-10-CM

## 2014-04-02 MED ORDER — FOLIC ACID 1 MG PO TABS: 1.0000 mg | ORAL_TABLET | Freq: Every day | ORAL | Status: DC

## 2014-04-02 MED ORDER — ACYCLOVIR 200 MG PO CAPS: 200.0000 mg | ORAL_CAPSULE | Freq: Two times a day (BID) | ORAL | Status: DC

## 2014-04-02 NOTE — Progress Notes (Signed)
prevnar given in deltoid with no complications

## 2014-04-26 DIAGNOSIS — E785 Hyperlipidemia, unspecified: Secondary | ICD-10-CM | POA: Diagnosis not present

## 2014-04-26 DIAGNOSIS — I359 Nonrheumatic aortic valve disorder, unspecified: Secondary | ICD-10-CM | POA: Diagnosis not present

## 2014-04-26 LAB — HEPATIC FUNCTION PANEL
ALK PHOS: 101 U/L (ref 39–117)
ALT: 13 U/L (ref 0–53)
AST: 17 U/L (ref 0–37)
Albumin: 3.8 g/dL (ref 3.5–5.2)
Bilirubin, Direct: 0.1 mg/dL (ref 0.0–0.3)
Indirect Bilirubin: 0.5 mg/dL (ref 0.2–1.2)
Total Bilirubin: 0.6 mg/dL (ref 0.2–1.2)
Total Protein: 5.3 g/dL — ABNORMAL LOW (ref 6.0–8.3)

## 2014-04-26 LAB — LIPID PANEL
Cholesterol: 262 mg/dL — ABNORMAL HIGH (ref 0–200)
HDL: 30 mg/dL — ABNORMAL LOW (ref >39)
LDL CALC: 201 mg/dL — AB (ref 0–99)
Total CHOL/HDL Ratio: 8.7 Ratio
Triglycerides: 155 mg/dL — ABNORMAL HIGH (ref <150)
VLDL: 31 mg/dL (ref 0–40)

## 2014-04-29 ENCOUNTER — Telehealth: Payer: Self-pay

## 2014-04-29 MED ORDER — SIMVASTATIN 40 MG PO TABS: 40.0000 mg | ORAL_TABLET | Freq: Every day | ORAL | Status: DC

## 2014-04-29 NOTE — Telephone Encounter (Signed)
Pt notified,escribed increase dose of zocor

## 2014-04-29 NOTE — Telephone Encounter (Signed)
-----   Message from Satira Sark, MD sent at 04/29/2014  9:28 AM EST ----- Reviewed. We last started Zocor, LDL is coming down from 249-201, still needs more improvement. If he has been tolerating Zocor 20 mg daily, we may try to increase to 40 mg daily next. LFTs normal.

## 2014-05-03 ENCOUNTER — Encounter (HOSPITAL_COMMUNITY): Payer: Medicare Other | Attending: Oncology | Admitting: Hematology & Oncology

## 2014-05-03 ENCOUNTER — Encounter (HOSPITAL_COMMUNITY): Payer: Medicare Other

## 2014-05-03 VITALS — BP 125/67 | HR 64 | Temp 97.8°F | Resp 16 | Wt 228.6 lb

## 2014-05-03 DIAGNOSIS — D649 Anemia, unspecified: Secondary | ICD-10-CM

## 2014-05-03 DIAGNOSIS — Z8572 Personal history of non-Hodgkin lymphomas: Secondary | ICD-10-CM

## 2014-05-03 DIAGNOSIS — C8190 Hodgkin lymphoma, unspecified, unspecified site: Secondary | ICD-10-CM

## 2014-05-03 DIAGNOSIS — Z452 Encounter for adjustment and management of vascular access device: Secondary | ICD-10-CM | POA: Diagnosis not present

## 2014-05-03 DIAGNOSIS — Z23 Encounter for immunization: Secondary | ICD-10-CM | POA: Diagnosis not present

## 2014-05-03 DIAGNOSIS — C9110 Chronic lymphocytic leukemia of B-cell type not having achieved remission: Secondary | ICD-10-CM | POA: Diagnosis not present

## 2014-05-03 DIAGNOSIS — N183 Chronic kidney disease, stage 3 (moderate): Secondary | ICD-10-CM | POA: Diagnosis not present

## 2014-05-03 LAB — CBC WITH DIFFERENTIAL/PLATELET
BASOS PCT: 1 % (ref 0–1)
Basophils Absolute: 0.1 10*3/uL (ref 0.0–0.1)
EOS PCT: 7 % — AB (ref 0–5)
Eosinophils Absolute: 0.7 10*3/uL (ref 0.0–0.7)
HEMATOCRIT: 33.4 % — AB (ref 39.0–52.0)
Hemoglobin: 10.8 g/dL — ABNORMAL LOW (ref 13.0–17.0)
Lymphocytes Relative: 56 % — ABNORMAL HIGH (ref 12–46)
Lymphs Abs: 6.1 10*3/uL — ABNORMAL HIGH (ref 0.7–4.0)
MCH: 31.9 pg (ref 26.0–34.0)
MCHC: 32.3 g/dL (ref 30.0–36.0)
MCV: 98.5 fL (ref 78.0–100.0)
MONO ABS: 0.9 10*3/uL (ref 0.1–1.0)
Monocytes Relative: 8 % (ref 3–12)
NEUTROS ABS: 3.1 10*3/uL (ref 1.7–7.7)
NEUTROS PCT: 29 % — AB (ref 43–77)
Platelets: 144 10*3/uL — ABNORMAL LOW (ref 150–400)
RBC: 3.39 MIL/uL — ABNORMAL LOW (ref 4.22–5.81)
RDW: 16.7 % — ABNORMAL HIGH (ref 11.5–15.5)
WBC: 10.8 10*3/uL — ABNORMAL HIGH (ref 4.0–10.5)

## 2014-05-03 LAB — COMPREHENSIVE METABOLIC PANEL
ALT: 13 U/L (ref 0–53)
ANION GAP: 3 — AB (ref 5–15)
AST: 18 U/L (ref 0–37)
Albumin: 3.7 g/dL (ref 3.5–5.2)
Alkaline Phosphatase: 103 U/L (ref 39–117)
BUN: 19 mg/dL (ref 6–23)
CHLORIDE: 108 mmol/L (ref 96–112)
CO2: 26 mmol/L (ref 19–32)
Calcium: 8.6 mg/dL (ref 8.4–10.5)
Creatinine, Ser: 1.57 mg/dL — ABNORMAL HIGH (ref 0.50–1.35)
GFR calc Af Amer: 50 mL/min — ABNORMAL LOW (ref >90)
GFR, EST NON AFRICAN AMERICAN: 43 mL/min — AB (ref >90)
Glucose, Bld: 115 mg/dL — ABNORMAL HIGH (ref 70–99)
Potassium: 4.5 mmol/L (ref 3.5–5.1)
Sodium: 137 mmol/L (ref 135–145)
Total Bilirubin: 0.8 mg/dL (ref 0.3–1.2)
Total Protein: 6.3 g/dL (ref 6.0–8.3)

## 2014-05-03 LAB — LACTATE DEHYDROGENASE: LDH: 223 U/L (ref 94–250)

## 2014-05-03 MED ORDER — HEPARIN SOD (PORK) LOCK FLUSH 100 UNIT/ML IV SOLN
500.0000 [IU] | Freq: Once | INTRAVENOUS | Status: AC
  Administered 2014-05-03: 500 [IU] via INTRAVENOUS
  Filled 2014-05-03: qty 5

## 2014-05-03 MED ORDER — SODIUM CHLORIDE 0.9 % IJ SOLN
10.0000 mL | INTRAMUSCULAR | Status: DC | PRN
  Administered 2014-05-03: 10 mL via INTRAVENOUS
  Filled 2014-05-03: qty 10

## 2014-05-03 NOTE — Progress Notes (Signed)
[name] presented for labwork. Labs per MD order drawn via Portacath located in the right chest wall accessed with  H 20 needle. Good blood return present. Procedure without incident.  Portacath flushed with 10ml NS and 500U/51ml Heparin per protocol and needle removed intact. Patient tolerated procedure well.

## 2014-05-03 NOTE — Progress Notes (Signed)
See doctors encounter for more information

## 2014-05-03 NOTE — Progress Notes (Signed)
[name], MD [address] [place] [address]    DIAGNOSIS: Hodgkin's disease   Staging form: Lymphoid Neoplasms, AJCC 6th Edition     Clinical: Stage III - Signed by Baird Cancer, PA on 09/14/2010 CLL CKD Stage IIIA Hypogammaglobulenemia  SUMMARY OF ONCOLOGIC HISTORY:   Hodgkin's disease   10/02/2007 Initial Diagnosis HODGKIN'S DISEASE    CURRENT THERAPY: Observation  INTERVAL HISTORY: SEVERO BEBER 69 y.o. male returns for follow-up of CLL. He also has a history of Hodgkin Lymphoma. He has no major complaints today.  He denies night sweats, fever or chills. His appetite is fairly good. His energy is baseline and unchanged. He is excited as he has been told he is having a new grandchild.   MEDICAL HISTORY: Past Medical History  Diagnosis Date  . Aortic stenosis   . Atrial fibrillation   . Coronary atherosclerosis of native coronary artery   . Hyperlipidemia   . Essential hypertension, benign   . BPH (benign prostatic hypertrophy)   . Arthritis   . Acid reflux   . Hypogammaglobulinemia   . Pre-diabetes 08/04/2011  . CLL (chronic lymphoblastic leukemia)   . Hodgkin's disease   . Kidney stones   . Wears glasses   . Wears dentures   . HOH (hard of hearing)   . Indirect inguinal hernia 04/13/2013    has Hodgkin's disease; Leukemia, lymphocytic, chronic; Mixed hyperlipidemia; OBESITY; CORONARY ATHEROSCLEROSIS NATIVE CORONARY ARTERY; Aortic valve disorder; Atrial fibrillation; OTHER DYSPHAGIA; AORTIC VALVE REPLACEMENT, HX OF; Essential hypertension, benign; GERD (gastroesophageal reflux disease); Thrombocytopenia; Hypogammaglobulinemia, acquired; Anemia; Pre-diabetes; Dental infection; Piles (hemorrhoids); Reduced vision; Neck mass; Cholelithiasis; Indirect inguinal hernia; Oral thrush; Mourning; Oral ulcer; Acute bronchitis; Medicare annual wellness visit, subsequent; Need for Tdap vaccination; and Chronic kidney disease (CKD) on his problem  list.     has No Known Allergies.  We administered heparin lock flush and sodium chloride.  SURGICAL HISTORY: Past Surgical History  Procedure Laterality Date  . Aortic valve replacement  10/09    1mm Magna Pericardial   . Esophagogastroduodenoscopy      scrapping of throat and stretching  . Lithotripsy    . Bone marrow aspiration    . Portacath placement    . 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 Colon surgery    . Dental surgery  08/2012  . Multiple tooth extractions    . Mass excision N/A 12/05/2012    Procedure: EXCISION MIDLINE NECK MASS;  Surgeon: Ascencion Dike, MD;  Location: [facility];  Service: ENT;  Laterality: N/A;  . Tongue biopsy  12/2012  . Cataract extraction w/phaco Right 01/07/2014    Procedure: CATARACT EXTRACTION PHACO AND INTRAOCULAR LENS PLACEMENT (IOC);  Surgeon: Tonny Branch, MD;  Location: [facility];  Service: Ophthalmology;  Laterality: Right;  CDE:15.31  . Eye surgery Right 02/07/2014    cataract  . Cataract extraction w/phaco Left 03/25/2014    Procedure: CATARACT EXTRACTION PHACO AND INTRAOCULAR LENS PLACEMENT (IOC);  Surgeon: Tonny Branch, MD;  Location: [facility];  Service: Ophthalmology;  Laterality: Left;  CDE 6.55    SOCIAL HISTORY: History   Social History  . Marital Status: Widowed    Spouse Name: N/A  . Number of Children: N/A  . Years of Education: N/A   Occupational History  . Not on file.   Social History Main Topics  . Smoking status: Never Smoker   . Smokeless  tobacco: Never Used     Comment: pt denies tobacco use   . Alcohol Use: No     Comment: pt denies alcohol   . Drug Use: No  . Sexual Activity: Not Currently   Other Topics Concern  . Not on file   Social History Narrative    FAMILY HISTORY: Family History  Problem Relation Age of Onset  . Heart failure Mother   . Blindness Mother   . Cancer Father 43    Lung   . Cancer Brother 27    acute leukemia     Review of Systems    Constitutional: Negative.   HENT: Negative.   Eyes: Negative.   Respiratory: Negative.   Cardiovascular: Negative.   Gastrointestinal: Negative.   Genitourinary:       Nocturia  Skin: Negative.   Neurological: Negative.   Endo/Heme/Allergies: Negative.   Psychiatric/Behavioral: Negative.     PHYSICAL EXAMINATION  ECOG PERFORMANCE STATUS: 0 - Asymptomatic  Filed Vitals:   05/03/14 1130  BP: 125/67  Pulse: 64  Temp: 97.8 F (36.6 C)  Resp: 16    Physical Exam  Constitutional: He is oriented to person, place, and time and well-developed, well-nourished, and in no distress.  HENT:  Head: Normocephalic and atraumatic.  Nose: Nose normal.  Mouth/Throat: Oropharynx is clear and moist. No oropharyngeal exudate.  Eyes: Conjunctivae and EOM are normal. Pupils are equal, round, and reactive to light. Right eye exhibits no discharge. Left eye exhibits no discharge. No scleral icterus.  Neck: Normal range of motion. Neck supple. No tracheal deviation present. No thyromegaly present.  Cardiovascular: Normal rate, regular rhythm and normal heart sounds.  Exam reveals no gallop and no friction rub.   No murmur heard. Pulmonary/Chest: Effort normal and breath sounds normal. He has no wheezes. He has no rales.  Abdominal: Soft. Bowel sounds are normal. He exhibits no distension and no mass. There is no tenderness. There is no rebound and no guarding.  Musculoskeletal: Normal range of motion. He exhibits no edema.  Lymphadenopathy:    He has no cervical adenopathy.  Neurological: He is alert and oriented to person, place, and time. He has normal reflexes. No cranial nerve deficit. Gait normal. Coordination normal.  Skin: Skin is warm and dry. No rash noted.  Psychiatric: Mood, memory, affect and judgment normal.  Nursing note and vitals reviewed.   LABORATORY DATA:  CBC    Component Value Date/Time   WBC 10.8* 05/03/2014 1211   RBC 3.39* 05/03/2014 1211   RBC 3.43* 02/08/2014  1146   HGB 10.8* 05/03/2014 1211   HCT 33.4* 05/03/2014 1211   PLT 144* 05/03/2014 1211   MCV 98.5 05/03/2014 1211   MCH 31.9 05/03/2014 1211   MCHC 32.3 05/03/2014 1211   RDW 16.7* 05/03/2014 1211   LYMPHSABS 6.1* 05/03/2014 1211   MONOABS 0.9 05/03/2014 1211   EOSABS 0.7 05/03/2014 1211   BASOSABS 0.1 05/03/2014 1211   CMP     Component Value Date/Time   NA 137 05/03/2014 1211   K 4.5 05/03/2014 1211   CL 108 05/03/2014 1211   CO2 26 05/03/2014 1211   GLUCOSE 115* 05/03/2014 1211   BUN 19 05/03/2014 1211   CREATININE 1.57* 05/03/2014 1211   CREATININE 1.40* 08/21/2013 0815   CALCIUM 8.6 05/03/2014 1211   PROT 6.3 05/03/2014 1211   PROT 5.7* 05/03/2014 1211   ALBUMIN 3.7 05/03/2014 1211   AST 18 05/03/2014 1211   ALT 13 05/03/2014 1211  ALKPHOS 103 05/03/2014 1211   BILITOT 0.8 05/03/2014 1211   GFRNONAA 4* 05/03/2014 1211   GFRAA 50* 05/03/2014 1211     ASSESSMENT and THERAPY PLAN:    Hodgkin's disease 69 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  He remains without recurrence and at this point is considered cured of his disease.  We will continue with observation. We discussed his Hodgkin lymphoma today and he understands that chances of recurrence at this point are very small. We will continue ongoing follow-up of his CLL. His interim problems or concerns he is to let us know.   Leukemia, lymphocytic, chronic Pleasant 69 year old male with CLL. He has had no treatment to date. He does have an anemia although I think this is more likely from his stage III chronic kidney disease. Given his CLL however this needs to be closely monitored. He has no complaints today such as night sweats, weight loss, change in appetite or energy level. We will continue with ongoing observation.   All questions were answered. The patient knows to call the clinic with any problems, questions or concerns. We can certainly see the patient much sooner if  necessary.   Molli Hazard 05/16/2014

## 2014-05-03 NOTE — Patient Instructions (Signed)
[name] at [facility]  Discharge Instructions:  Report fevers, night sweats, unexplained weight loss or other concerns. Port flushes ever 6 weeks Labs and office visit in 4 months. _______________________________________________________________  Thank you for choosing [place] at [facility] to provide your oncology and hematology care.  To afford each patient quality time with our providers, please arrive at least 15 minutes before your scheduled appointment.  You need to re-schedule your appointment if you arrive 10 or more minutes late.  We strive to give you quality time with our providers, and arriving late affects you and other patients whose appointments are after yours.  Also, if you no show three or more times for appointments you may be dismissed from the clinic.  Again, thank you for choosing [place] at [place] hope is that these requests will allow you access to exceptional care and in a timely manner. _______________________________________________________________  If you have questions after your visit, please contact our office at [phone] between the hours of 8:30 a.m. and 5:00 p.m. Voicemails left after 4:30 p.m. will not be returned until the following business day. _______________________________________________________________  For prescription refill requests, have your pharmacy contact our office. _______________________________________________________________  Recommendations made by the consultant and any test results will be sent to your referring physician. _______________________________________________________________

## 2014-05-05 LAB — KAPPA/LAMBDA LIGHT CHAINS
KAPPA FREE LGHT CHN: 10.6 mg/L (ref 3.30–19.40)
Kappa, lambda light chain ratio: 0.42 (ref 0.26–1.65)
Lambda free light chains: 25.22 mg/L (ref 5.71–26.30)

## 2014-05-08 LAB — MULTIPLE MYELOMA PANEL, SERUM
Albumin ELP: 65.9 % (ref 55.8–66.1)
Alpha-1-Globulin: 5.8 % — ABNORMAL HIGH (ref 2.9–4.9)
Alpha-2-Globulin: 15.7 % — ABNORMAL HIGH (ref 7.1–11.8)
Beta 2: 4.6 % (ref 3.2–6.5)
Beta Globulin: 6 % (ref 4.7–7.2)
GAMMA GLOBULIN: 2 % — AB (ref 11.1–18.8)
IGG (IMMUNOGLOBIN G), SERUM: 104 mg/dL — AB (ref 650–1600)
IGM, SERUM: 8 mg/dL — AB (ref 41–251)
IgA: 9 mg/dL — ABNORMAL LOW (ref 68–379)
M-Spike, %: NOT DETECTED g/dL
Total Protein: 5.7 g/dL — ABNORMAL LOW (ref 6.0–8.3)

## 2014-05-10 ENCOUNTER — Encounter (HOSPITAL_COMMUNITY): Payer: Self-pay | Admitting: Hematology & Oncology

## 2014-05-10 DIAGNOSIS — N189 Chronic kidney disease, unspecified: Secondary | ICD-10-CM | POA: Insufficient documentation

## 2014-05-10 NOTE — Assessment & Plan Note (Addendum)
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  He remains without recurrence and at this point is considered cured of his disease.  We will continue with observation. We discussed his Hodgkin lymphoma today and he understands that chances of recurrence at this point are very small. We will continue ongoing follow-up of his CLL. His interim problems or concerns he is to let us know.

## 2014-05-16 NOTE — Assessment & Plan Note (Signed)
Pleasant 69 year old male with CLL. He has had no treatment to date. He does have an anemia although I think this is more likely from his stage III chronic kidney disease. Given his CLL however this needs to be closely monitored. He has no complaints today such as night sweats, weight loss, change in appetite or energy level. We will continue with ongoing observation.

## 2014-06-11 ENCOUNTER — Encounter: Payer: Self-pay | Admitting: Family Medicine

## 2014-06-11 ENCOUNTER — Ambulatory Visit (INDEPENDENT_AMBULATORY_CARE_PROVIDER_SITE_OTHER): Payer: Medicare Other | Admitting: Family Medicine

## 2014-06-11 VITALS — BP 134/72 | HR 61 | Resp 16 | Ht 70.0 in | Wt 233.0 lb

## 2014-06-11 DIAGNOSIS — Z1211 Encounter for screening for malignant neoplasm of colon: Secondary | ICD-10-CM | POA: Diagnosis not present

## 2014-06-11 DIAGNOSIS — I10 Essential (primary) hypertension: Secondary | ICD-10-CM

## 2014-06-11 DIAGNOSIS — E782 Mixed hyperlipidemia: Secondary | ICD-10-CM

## 2014-06-11 DIAGNOSIS — N3000 Acute cystitis without hematuria: Secondary | ICD-10-CM

## 2014-06-11 DIAGNOSIS — E669 Obesity, unspecified: Secondary | ICD-10-CM

## 2014-06-11 LAB — POCT URINALYSIS DIPSTICK
Bilirubin, UA: NEGATIVE
Blood, UA: NEGATIVE
Glucose, UA: NEGATIVE
Ketones, UA: NEGATIVE
Nitrite, UA: NEGATIVE
PH UA: 5.5
Protein, UA: NEGATIVE
SPEC GRAV UA: 1.02
UROBILINOGEN UA: 1

## 2014-06-11 MED ORDER — LEVOFLOXACIN 500 MG PO TABS: 500.0000 mg | ORAL_TABLET | Freq: Every day | ORAL | Status: DC

## 2014-06-11 NOTE — Patient Instructions (Addendum)
Annual physical in 3.5 month, call if you need me before You are treated for urinary infection  Continue to use tylenol for neck pain and spasm  Pls discuss colonoscopy with nurse you need this

## 2014-06-11 NOTE — Progress Notes (Signed)
Subjective:    Patient ID: [name], male    DOB: [birth year], 69 y.o.   MRN: [id number]  HPI   [name]     MRN: [id number]      DOB: 02/15/1946   HPI [name] is here with main c/o abdominal pain, lower, with chills and urinary frequency and mild dysuria which started on the day of the visit Preventive health is updated, specifically  Cancer screening and Immunization.   Questions or concerns regarding consultations or procedures which the PT has had in the interim are  addressed. The PT denies any adverse reactions to current medications since the last visit.    ROS Denies recent fever , has had  chills. Denies sinus pressure, nasal congestion, ear pain or sore throat. Denies chest congestion, productive cough or wheezing. Denies chest pains, palpitations and leg swelling . Denies joint pain, swelling and limitation in mobility. Denies headaches, seizures, numbness, or tingling. Denies depression, anxiety or insomnia. Denies skin break down or rash.   PE  BP 134/72 mmHg  Pulse 61  Resp 16  Ht 5\' 10"  (1.778 m)  Wt 233 lb (105.688 kg)  BMI 33.43 kg/m2  SpO2 98%  Patient alert and oriented and in no cardiopulmonary distress.  HEENT: No facial asymmetry, EOMI,   oropharynx pink and moist.  Neck supple no JVD, no mass.  Chest: Clear to auscultation bilaterally.  CVS: S1, S2 no murmurs, no S3.Regular rate.  ABD: Soft no rebound guarding or tenderness, no rena;l angle tenderness, mild suprapubic tenderness  Ext: No edema  MS: Adequate ROM spine, shoulders, hips and knees.  Skin: Intact, no ulcerations or rash noted.  Psych: Good eye contact, normal affect. Memory intact not anxious or depressed appearing.  CNS: CN 2-12 intact, power,  normal throughout.no focal deficits noted.   Assessment & Plan   Essential hypertension, benign Controlled, no change in medication DASH diet and commitment to daily physical activity for a minimum of 30  minutes discussed and encouraged, as a part of hypertension management. The importance of attaining a healthy weight is also discussed.  BP/Weight 06/14/2014 06/11/2014 05/03/2014 04/02/2014 03/25/2014 03/20/2014 32/95/1884  Systolic BP 166 063 016 - 010 80 932  Diastolic BP 59 72 67 - 72 54 67  Wt. (Lbs) - 233 228.6 221 - - -  BMI - 33.43 32.8 31.71 - - -    CMP Latest Ref Rng 05/03/2014 05/03/2014 04/26/2014  Glucose 70 - 99 mg/dL - 115(H) -  BUN 6 - 23 mg/dL - 19 -  Creatinine 0.50 - 1.35 mg/dL - 1.57(H) -  Sodium 135 - 145 mmol/L - 137 -  Potassium 3.5 - 5.1 mmol/L - 4.5 -  Chloride 96 - 112 mmol/L - 108 -  CO2 19 - 32 mmol/L - 26 -  Calcium 8.4 - 10.5 mg/dL - 8.6 -  Total Protein 6.0 - 8.3 g/dL 5.7(L) 6.3 5.3(L)  Total Bilirubin 0.3 - 1.2 mg/dL - 0.8 0.6  Alkaline Phos 39 - 117 U/L - 103 101  AST 0 - 37 U/L - 18 17  ALT 0 - 53 U/L - 13 13        Acute cystitis Abnormal UA in office will treat presumptively for UTI and f/u c/s , if he indeed has a uTI will need urology eval   Mixed hyperlipidemia Hyperlipidemia:Low fat diet discussed and encouraged. Uncontrolled, now taking  An affordable med regualrly Updated lab needed at/ before next visit.  CMP Latest Ref Rng 05/03/2014 05/03/2014 04/26/2014  Glucose 70 - 99 mg/dL - 115(H) -  BUN 6 - 23 mg/dL - 19 -  Creatinine 0.50 - 1.35 mg/dL - 1.57(H) -  Sodium 135 - 145 mmol/L - 137 -  Potassium 3.5 - 5.1 mmol/L - 4.5 -  Chloride 96 - 112 mmol/L - 108 -  CO2 19 - 32 mmol/L - 26 -  Calcium 8.4 - 10.5 mg/dL - 8.6 -  Total Protein 6.0 - 8.3 g/dL 5.7(L) 6.3 5.3(L)  Total Bilirubin 0.3 - 1.2 mg/dL - 0.8 0.6  Alkaline Phos 39 - 117 U/L - 103 101  AST 0 - 37 U/L - 18 17  ALT 0 - 53 U/L - 13 13    Lipid Panel     Component Value Date/Time   CHOL 262* 04/26/2014 0843   TRIG 155* 04/26/2014 0843   HDL 30* 04/26/2014 0843   CHOLHDL 8.7 04/26/2014 0843   VLDL 31 04/26/2014 0843   LDLCALC 201* 04/26/2014 0843      Updated lab  needed at/ before next visit.    Obesity Unchnaged Patient re-educated about  the importance of commitment to a  minimum of 150 minutes of exercise per week.  The importance of healthy food choices with portion control discussed. Encouraged to start a food diary, count calories and to consider  joining a support group. Sample diet sheets offered. Goals set by the patient for the next several months.   Wt Readings from Last 3 Encounters:  06/11/14 233 lb (105.688 kg)  05/03/14 228 lb 9.6 oz (103.692 kg)  04/02/14 221 lb (100.245 kg)    Body mass index is 33.43 kg/(m^2).  Current exercise per week 30 minutes.         Review of Systems     Objective:   Physical Exam        Assessment & Plan:

## 2014-06-13 LAB — URINE CULTURE: Colony Count: 8000

## 2014-06-14 ENCOUNTER — Encounter (HOSPITAL_COMMUNITY): Payer: Self-pay

## 2014-06-14 ENCOUNTER — Encounter (HOSPITAL_COMMUNITY): Payer: Medicare Other | Attending: Oncology

## 2014-06-14 VITALS — BP 134/59 | HR 71 | Temp 97.7°F | Resp 18

## 2014-06-14 DIAGNOSIS — Z95828 Presence of other vascular implants and grafts: Secondary | ICD-10-CM

## 2014-06-14 DIAGNOSIS — C9110 Chronic lymphocytic leukemia of B-cell type not having achieved remission: Secondary | ICD-10-CM | POA: Diagnosis not present

## 2014-06-14 DIAGNOSIS — Z452 Encounter for adjustment and management of vascular access device: Secondary | ICD-10-CM

## 2014-06-14 DIAGNOSIS — Z23 Encounter for immunization: Secondary | ICD-10-CM | POA: Insufficient documentation

## 2014-06-14 DIAGNOSIS — C8190 Hodgkin lymphoma, unspecified, unspecified site: Secondary | ICD-10-CM | POA: Insufficient documentation

## 2014-06-14 MED ORDER — SODIUM CHLORIDE 0.9 % IJ SOLN
10.0000 mL | INTRAMUSCULAR | Status: DC | PRN
  Administered 2014-06-14: 40 mL via INTRAVENOUS
  Filled 2014-06-14: qty 10

## 2014-06-14 MED ORDER — HEPARIN SOD (PORK) LOCK FLUSH 100 UNIT/ML IV SOLN
500.0000 [IU] | Freq: Once | INTRAVENOUS | Status: AC
  Administered 2014-06-14: 500 [IU] via INTRAVENOUS
  Filled 2014-06-14: qty 5

## 2014-06-14 NOTE — Patient Instructions (Signed)
[facility] at [facility]  Discharge Instructions:  You had your port flushed today.  Please follow up as scheduled Call the clinic if you have any questions or concerns _______________________________________________________________  Thank you for choosing [facility] at [facility] to provide your oncology and hematology care.  To afford each patient quality time with our providers, please arrive at least 15 minutes before your scheduled appointment.  You need to re-schedule your appointment if you arrive 10 or more minutes late.  We strive to give you quality time with our providers, and arriving late affects you and other patients whose appointments are after yours.  Also, if you no show three or more times for appointments you may be dismissed from the clinic.  Again, thank you for choosing [facility] at [facility]. Our hope is that these requests will allow you access to exceptional care and in a timely manner. _______________________________________________________________  If you have questions after your visit, please contact our office at [phone] between the hours of 8:30 a.m. and 5:00 p.m. Voicemails left after 4:30 p.m. will not be returned until the following business day. _______________________________________________________________  For prescription refill requests, have your pharmacy contact our office. _______________________________________________________________  Recommendations made by the consultant and any test results will be sent to your referring physician. _______________________________________________________________ 

## 2014-06-14 NOTE — Progress Notes (Signed)
[name] presented for Portacath access and flush.  Proper placement of portacath confirmed by CXR.  Portacath located right chest wall accessed with  H 20 needle.  Good blood return present. Portacath flushed with 67ml NS and 500U/19ml Heparin and needle removed intact.  Procedure tolerated well and without incident.

## 2014-06-20 ENCOUNTER — Telehealth: Payer: Self-pay | Admitting: Cardiology

## 2014-06-20 NOTE — Telephone Encounter (Signed)
Patient wants to know if he needs blood work drawn prior to appt in 4/26/tg

## 2014-06-20 NOTE — Telephone Encounter (Signed)
LM that he was not due for labs,just had some 2 months ago

## 2014-06-30 NOTE — Assessment & Plan Note (Signed)
Abnormal UA in office will treat presumptively for UTI and f/u c/s , if he indeed has a uTI will need urology eval

## 2014-06-30 NOTE — Assessment & Plan Note (Signed)
Controlled, no change in medication DASH diet and commitment to daily physical activity for a minimum of 30 minutes discussed and encouraged, as a part of hypertension management. The importance of attaining a healthy weight is also discussed.  BP/Weight 06/14/2014 06/11/2014 05/03/2014 04/02/2014 03/25/2014 03/20/2014 46/65/9935  Systolic BP 701 779 390 - 300 80 923  Diastolic BP 59 72 67 - 72 54 67  Wt. (Lbs) - 233 228.6 221 - - -  BMI - 33.43 32.8 31.71 - - -    CMP Latest Ref Rng 05/03/2014 05/03/2014 04/26/2014  Glucose 70 - 99 mg/dL - 115(H) -  BUN 6 - 23 mg/dL - 19 -  Creatinine 0.50 - 1.35 mg/dL - 1.57(H) -  Sodium 135 - 145 mmol/L - 137 -  Potassium 3.5 - 5.1 mmol/L - 4.5 -  Chloride 96 - 112 mmol/L - 108 -  CO2 19 - 32 mmol/L - 26 -  Calcium 8.4 - 10.5 mg/dL - 8.6 -  Total Protein 6.0 - 8.3 g/dL 5.7(L) 6.3 5.3(L)  Total Bilirubin 0.3 - 1.2 mg/dL - 0.8 0.6  Alkaline Phos 39 - 117 U/L - 103 101  AST 0 - 37 U/L - 18 17  ALT 0 - 53 U/L - 13 13

## 2014-06-30 NOTE — Assessment & Plan Note (Signed)
Unchnaged Patient re-educated about  the importance of commitment to a  minimum of 150 minutes of exercise per week.  The importance of healthy food choices with portion control discussed. Encouraged to start a food diary, count calories and to consider  joining a support group. Sample diet sheets offered. Goals set by the patient for the next several months.   Wt Readings from Last 3 Encounters:  06/11/14 233 lb (105.688 kg)  05/03/14 228 lb 9.6 oz (103.692 kg)  04/02/14 221 lb (100.245 kg)    Body mass index is 33.43 kg/(m^2).  Current exercise per week 30 minutes.

## 2014-06-30 NOTE — Assessment & Plan Note (Addendum)
Hyperlipidemia:Low fat diet discussed and encouraged. Uncontrolled, now taking  An affordable med regualrly Updated lab needed at/ before next visit.   CMP Latest Ref Rng 05/03/2014 05/03/2014 04/26/2014  Glucose 70 - 99 mg/dL - 115(H) -  BUN 6 - 23 mg/dL - 19 -  Creatinine 0.50 - 1.35 mg/dL - 1.57(H) -  Sodium 135 - 145 mmol/L - 137 -  Potassium 3.5 - 5.1 mmol/L - 4.5 -  Chloride 96 - 112 mmol/L - 108 -  CO2 19 - 32 mmol/L - 26 -  Calcium 8.4 - 10.5 mg/dL - 8.6 -  Total Protein 6.0 - 8.3 g/dL 5.7(L) 6.3 5.3(L)  Total Bilirubin 0.3 - 1.2 mg/dL - 0.8 0.6  Alkaline Phos 39 - 117 U/L - 103 101  AST 0 - 37 U/L - 18 17  ALT 0 - 53 U/L - 13 13    Lipid Panel     Component Value Date/Time   CHOL 262* 04/26/2014 0843   TRIG 155* 04/26/2014 0843   HDL 30* 04/26/2014 0843   CHOLHDL 8.7 04/26/2014 0843   VLDL 31 04/26/2014 0843   LDLCALC 201* 04/26/2014 0843      Updated lab needed at/ before next visit.

## 2014-07-23 ENCOUNTER — Ambulatory Visit (INDEPENDENT_AMBULATORY_CARE_PROVIDER_SITE_OTHER): Payer: Medicare Other | Admitting: Cardiology

## 2014-07-23 ENCOUNTER — Encounter: Payer: Self-pay | Admitting: Cardiology

## 2014-07-23 VITALS — BP 120/66 | HR 88 | Ht 70.0 in | Wt 230.0 lb

## 2014-07-23 DIAGNOSIS — E785 Hyperlipidemia, unspecified: Secondary | ICD-10-CM

## 2014-07-23 DIAGNOSIS — I482 Chronic atrial fibrillation: Secondary | ICD-10-CM | POA: Diagnosis not present

## 2014-07-23 DIAGNOSIS — I480 Paroxysmal atrial fibrillation: Secondary | ICD-10-CM | POA: Diagnosis not present

## 2014-07-23 DIAGNOSIS — I050 Rheumatic mitral stenosis: Secondary | ICD-10-CM | POA: Diagnosis not present

## 2014-07-23 DIAGNOSIS — I2510 Atherosclerotic heart disease of native coronary artery without angina pectoris: Secondary | ICD-10-CM | POA: Diagnosis not present

## 2014-07-23 NOTE — Progress Notes (Signed)
Cardiology Office Note  Date: 07/23/2014   ID: Contrell, [place] Mar 09, 1946, MRN [id number]  PCP: [name], MD  Primary Cardiologist: Rozann Lesches, MD   Chief Complaint  Patient presents with  . Aortic valve disease  . Atrial Fibrillation  . Coronary Artery Disease    History of Present Illness: [name] is a 69 y.o. male last seen in October 2015. He presents for a routine follow-up visit. From a cardiac perspective, he reports no new symptoms, stable NYHA class II dyspnea and no angina. We reviewed his medications. He reports tolerating Zocor at 40 mg daily, his last lipid assessment was in January as noted below.  ECG today shows sinus rhythm with prolonged PR interval and IVCD.  Most recent echocardiogram from late last year is also reviewed below, stable LVEF and aortic prosthesis. He does have mitral stenosis which we are following conservatively at this time.   Past Medical History  Diagnosis Date  . Aortic stenosis   . Atrial fibrillation   . Coronary atherosclerosis of native coronary artery   . Hyperlipidemia   . Essential hypertension, benign   . BPH (benign prostatic hypertrophy)   . Arthritis   . Acid reflux   . Hypogammaglobulinemia   . Pre-diabetes 08/04/2011  . CLL (chronic lymphoblastic leukemia)   . Hodgkin's disease   . Kidney stones   . Wears glasses   . Wears dentures   . HOH (hard of hearing)   . Indirect inguinal hernia 04/13/2013    Past Surgical History  Procedure Laterality Date  . Aortic valve replacement  10/09    22mm Magna Pericardial   . Esophagogastroduodenoscopy      scrapping of throat and stretching  . Lithotripsy    . Bone marrow aspiration    . Portacath placement    . 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 Colon surgery    . Dental surgery  08/2012  . Multiple tooth extractions    . Mass excision N/A 12/05/2012    Procedure: EXCISION MIDLINE NECK MASS;  Surgeon: Ascencion Dike, MD;   Location: [place];  Service: ENT;  Laterality: N/A;  . Tongue biopsy  12/2012  . Cataract extraction w/phaco Right 01/07/2014    Procedure: CATARACT EXTRACTION PHACO AND INTRAOCULAR LENS PLACEMENT (IOC);  Surgeon: Tonny Branch, MD;  Location: [facility];  Service: Ophthalmology;  Laterality: Right;  CDE:15.31  . Eye surgery Right 02/07/2014    cataract  . Cataract extraction w/phaco Left 03/25/2014    Procedure: CATARACT EXTRACTION PHACO AND INTRAOCULAR LENS PLACEMENT (IOC);  Surgeon: Tonny Branch, MD;  Location: [facility];  Service: Ophthalmology;  Laterality: Left;  CDE 6.55    Current Outpatient Prescriptions  Medication Sig Dispense Refill  . acetaminophen (TYLENOL) 500 MG tablet Take 1,000 mg by mouth every 6 (six) hours as needed for fever.     Marland Kitchen acyclovir (ZOVIRAX) 200 MG capsule Take 1 capsule (200 mg total) by mouth 2 (two) times daily. 60 capsule 5  . aspirin EC 81 MG tablet Take 81 mg by mouth daily.    . folic acid (FOLVITE) 1 MG tablet Take 1 tablet (1 mg total) by mouth daily. 30 tablet 5  . metoprolol (LOPRESSOR) 50 MG tablet TAKE ONE TABLET BY MOUTH TWICE DAILY 60 tablet 6  . Multiple Vitamins-Minerals (CENTRUM SILVER ULTRA MENS PO) Take 1 tablet by mouth every morning.     Marland Kitchen [place]  3 1000 MG CAPS Take 1,000 mg by mouth daily.     Marland Kitchen omeprazole (PRILOSEC) 20 MG capsule Take 20 mg by mouth every morning.     . simvastatin (ZOCOR) 40 MG tablet Take 1 tablet (40 mg total) by mouth at bedtime. 90 tablet 3  . sulfamethoxazole-trimethoprim (BACTRIM DS,SEPTRA DS) 800-160 MG per tablet Take 1 tablet on Monday, Wednesday, and Friday. 36 tablet 3   No current facility-administered medications for this visit.    Allergies:  Review of patient's allergies indicates no known allergies.   Social History: The patient  reports that he has never smoked. He has never used smokeless tobacco. He reports that he does not drink alcohol or use illicit drugs.   ROS:  Please see the history  of present illness. Otherwise, complete review of systems is positive for none.  All other systems are reviewed and negative.   Physical Exam: VS:  BP 120/66 mmHg  Pulse 88  Ht 5\' 10"  (1.778 m)  Wt 230 lb (104.327 kg)  BMI 33.00 kg/m2, BMI Body mass index is 33 kg/(m^2).  Wt Readings from Last 3 Encounters:  07/23/14 230 lb (104.327 kg)  06/11/14 233 lb (105.688 kg)  05/03/14 228 lb 9.6 oz (103.692 kg)     Chronically ill-appearing, no distress.  HEENT: Conjunctiva and lids normal, oropharynx with poor dentition.  Neck: Supple, no elevated jugular venous pressure or bruits.  Lungs: Clear to auscultation, nonlabored.  Cardiac: Regular rate and rhythm, S4, 2/6 systolic murmur at the base, no S3.  Abdomen: Obese, nontender, bowel sounds present.  Extremities: Venous stasis noted, 1-2+ edema, distal pulses diminished.  Skin: Warm and dry.  Musculoskeletal: No gross deformities.  Neuropsychiatric: Alert and oriented x3, affect grossly normal.   ECG: ECG is ordered today and shows sinus rhythm with prolonged PR interval and IVCD.  Recent Labwork: 05/03/2014: ALT 13; AST 18; BUN 19; Creatinine 1.57*; Hemoglobin 10.8*; Platelets 144*; Potassium 4.5; Sodium 137     Component Value Date/Time   CHOL 262* 04/26/2014 0843   TRIG 155* 04/26/2014 0843   HDL 30* 04/26/2014 0843   CHOLHDL 8.7 04/26/2014 0843   VLDL 31 04/26/2014 0843   LDLCALC 201* 04/26/2014 0843    Other Studies Reviewed Today:  Echocardiogram 01/24/2014: Study Conclusions  - Left ventricle: The cavity size was normal. Wall thickness was increased increased in a pattern of mild to moderate LVH. Systolic function was normal. The estimated ejection fraction was in the range of 60% to 65%. Diastolic function is abnormal, indeterminate grade. - Aortic valve: A 23 mm Edwards Magna pericardial valve is in the AV position. Moderately calcified annulus. Mildly thickened leaflets. There was mild  regurgitation. Mean gradient (S): 9 mm Hg. VTI ratio of LVOT to aortic valve: 0.48. Valve area (VTI): 2 cm^2. Valve area (Vmax): 1.85 cm^2. Valve area (Vmean): 2.04 cm^2. - Mitral valve: Moderately to severely calcified annulus. Moderately thickened leaflets . The findings are consistent with severe stenosis by mean pressure gradient. There was mild regurgitation. Mean gradient (D): 12 mm Hg. Cannot distinguish PHT from available spectral Doppler tracings, unable to calculate MVA by PHT. The presence of AI prohibits accurate calculation of MVA by continuity equation. Morphologically there appears to be severe mitral stenosis. - Left atrium: The atrium was severely dilated. - Right ventricle: The cavity size was mildly dilated. - Right atrium: The atrium was moderately dilated. - Pulmonary arteries: Systolic pressure was moderately increased. PA peak pressure: 43 mm Hg (S). - Technically  difficult study.   ASSESSMENT AND PLAN:  1. History of aortic stenosis status post pericardial AVR, stable by echocardiogram in October 2015.  2. Significant MAC with mitral stenosis, following conservatively at this time.  3. Hyperlipidemia, so for tolerating Zocor 40 daily. Follow-up FLP.  4. CAD status post SVG to RCA, clinically stable without active angina.  Current medicines were reviewed at length with the patient today.   Orders Placed This Encounter  Procedures  . EKG 12-Lead    Disposition: FU with me in 6 months.   Signed, Satira Sark, MD, [facility] 07/23/2014 2:21 PM    [facility] at [facility] [address] [place], [place] [address] Phone: [phone]; Fax: [phone]

## 2014-07-23 NOTE — Patient Instructions (Signed)
Your physician wants you to follow-up in: 6 months with Dr.McDowell You will receive a reminder letter in the mail two months in advance. If you don't receive a letter, please call our office to schedule the follow-up appointment.    Your physician recommends that you continue on your current medications as directed. Please refer to the Current Medication list given to you today.   Please get FASTING lab work     Thank you for choosing [place] [facility] !

## 2014-07-25 DIAGNOSIS — E785 Hyperlipidemia, unspecified: Secondary | ICD-10-CM | POA: Diagnosis not present

## 2014-07-25 LAB — LIPID PANEL
CHOLESTEROL: 239 mg/dL — AB (ref 0–200)
HDL: 25 mg/dL — ABNORMAL LOW (ref >=40)
LDL Cholesterol: 174 mg/dL — ABNORMAL HIGH (ref 0–99)
Total CHOL/HDL Ratio: 9.6 Ratio
Triglycerides: 198 mg/dL — ABNORMAL HIGH (ref <150)
VLDL: 40 mg/dL (ref 0–40)

## 2014-07-26 ENCOUNTER — Encounter (HOSPITAL_COMMUNITY): Payer: Self-pay

## 2014-07-26 ENCOUNTER — Encounter (HOSPITAL_COMMUNITY): Payer: Medicare Other | Attending: Oncology

## 2014-07-26 DIAGNOSIS — C8190 Hodgkin lymphoma, unspecified, unspecified site: Secondary | ICD-10-CM | POA: Insufficient documentation

## 2014-07-26 DIAGNOSIS — Z23 Encounter for immunization: Secondary | ICD-10-CM | POA: Insufficient documentation

## 2014-07-26 DIAGNOSIS — C9110 Chronic lymphocytic leukemia of B-cell type not having achieved remission: Secondary | ICD-10-CM | POA: Diagnosis not present

## 2014-07-26 DIAGNOSIS — Z452 Encounter for adjustment and management of vascular access device: Secondary | ICD-10-CM

## 2014-07-26 MED ORDER — SODIUM CHLORIDE 0.9 % IJ SOLN
10.0000 mL | INTRAMUSCULAR | Status: DC | PRN
  Administered 2014-07-26: 10 mL via INTRAVENOUS
  Filled 2014-07-26: qty 10

## 2014-07-26 MED ORDER — HEPARIN SOD (PORK) LOCK FLUSH 100 UNIT/ML IV SOLN
500.0000 [IU] | Freq: Once | INTRAVENOUS | Status: AC
  Administered 2014-07-26: 500 [IU] via INTRAVENOUS

## 2014-07-26 NOTE — Progress Notes (Signed)
1330:  [name] presented for Portacath access and flush.  Portacath located right chest wall accessed with  H 20 needle.  Good blood return present. Portacath flushed with 55ml NS and 500U/25ml Heparin and needle removed intact.  Procedure tolerated well and without incident.

## 2014-09-06 ENCOUNTER — Encounter (HOSPITAL_COMMUNITY): Payer: Medicare Other | Attending: Hematology & Oncology

## 2014-09-06 VITALS — BP 157/58 | HR 71 | Temp 98.0°F | Resp 18

## 2014-09-06 DIAGNOSIS — C9110 Chronic lymphocytic leukemia of B-cell type not having achieved remission: Secondary | ICD-10-CM | POA: Diagnosis not present

## 2014-09-06 DIAGNOSIS — Z452 Encounter for adjustment and management of vascular access device: Secondary | ICD-10-CM

## 2014-09-06 DIAGNOSIS — Z95828 Presence of other vascular implants and grafts: Secondary | ICD-10-CM

## 2014-09-06 MED ORDER — HEPARIN SOD (PORK) LOCK FLUSH 100 UNIT/ML IV SOLN
500.0000 [IU] | Freq: Once | INTRAVENOUS | Status: AC
  Administered 2014-09-06: 500 [IU] via INTRAVENOUS

## 2014-09-06 MED ORDER — SODIUM CHLORIDE 0.9 % IJ SOLN
10.0000 mL | INTRAMUSCULAR | Status: DC | PRN
  Administered 2014-09-06: 10 mL via INTRAVENOUS
  Filled 2014-09-06: qty 10

## 2014-09-06 NOTE — Progress Notes (Signed)
[name] presented for Portacath access and flush.  Proper placement of portacath confirmed by CXR.  Portacath located right chest wall accessed with  H 20 needle.  Good blood return present. Portacath flushed with 23ml NS and 500U/28ml Heparin and needle removed intact.  Procedure tolerated well and without incident.

## 2014-09-06 NOTE — Patient Instructions (Signed)
[facility] at [facility] Discharge Instructions  RECOMMENDATIONS MADE BY THE CONSULTANT AND ANY TEST RESULTS WILL BE SENT TO YOUR REFERRING PHYSICIAN.  Port flush today Follow up as scheduled  Call the clinic if you have any questions or concerns  Thank you for choosing [facility] at [facility] to provide your oncology and hematology care.  To afford each patient quality time with our provider, please arrive at least 15 minutes before your scheduled appointment time.    You need to re-schedule your appointment should you arrive 10 or more minutes late.  We strive to give you quality time with our providers, and arriving late affects you and other patients whose appointments are after yours.  Also, if you no show three or more times for appointments you may be dismissed from the clinic at the providers discretion.     Again, thank you for choosing [facility].  Our hope is that these requests will decrease the amount of time that you wait before being seen by our physicians.       _____________________________________________________________  Should you have questions after your visit to  Cancer Center, please contact our office at [phone] between the hours of 8:30 a.m. and 4:30 p.m.  Voicemails left after 4:30 p.m. will not be returned until the following business day.  For prescription refill requests, have your pharmacy contact our office.    

## 2014-09-09 ENCOUNTER — Other Ambulatory Visit: Payer: Self-pay

## 2014-09-09 MED ORDER — METOPROLOL TARTRATE 50 MG PO TABS: 50.0000 mg | ORAL_TABLET | Freq: Two times a day (BID) | ORAL | Status: DC

## 2014-10-08 ENCOUNTER — Other Ambulatory Visit (HOSPITAL_COMMUNITY): Payer: Self-pay | Admitting: Oncology

## 2014-10-08 DIAGNOSIS — D801 Nonfamilial hypogammaglobulinemia: Secondary | ICD-10-CM

## 2014-10-08 DIAGNOSIS — C9110 Chronic lymphocytic leukemia of B-cell type not having achieved remission: Secondary | ICD-10-CM

## 2014-10-08 DIAGNOSIS — C8190 Hodgkin lymphoma, unspecified, unspecified site: Secondary | ICD-10-CM

## 2014-10-08 MED ORDER — FOLIC ACID 1 MG PO TABS: 1.0000 mg | ORAL_TABLET | Freq: Every day | ORAL | Status: DC

## 2014-10-08 MED ORDER — ACYCLOVIR 200 MG PO CAPS: 200.0000 mg | ORAL_CAPSULE | Freq: Two times a day (BID) | ORAL | Status: DC

## 2014-10-10 ENCOUNTER — Encounter: Payer: Self-pay | Admitting: Family Medicine

## 2014-10-10 ENCOUNTER — Ambulatory Visit (INDEPENDENT_AMBULATORY_CARE_PROVIDER_SITE_OTHER): Payer: Medicare Other | Admitting: Family Medicine

## 2014-10-10 ENCOUNTER — Other Ambulatory Visit: Payer: Self-pay | Admitting: Family Medicine

## 2014-10-10 VITALS — BP 126/78 | HR 95 | Resp 18 | Ht 70.0 in | Wt 242.1 lb

## 2014-10-10 DIAGNOSIS — I10 Essential (primary) hypertension: Secondary | ICD-10-CM

## 2014-10-10 DIAGNOSIS — L819 Disorder of pigmentation, unspecified: Secondary | ICD-10-CM

## 2014-10-10 DIAGNOSIS — Z0000 Encounter for general adult medical examination without abnormal findings: Secondary | ICD-10-CM

## 2014-10-10 DIAGNOSIS — E669 Obesity, unspecified: Secondary | ICD-10-CM

## 2014-10-10 DIAGNOSIS — E782 Mixed hyperlipidemia: Secondary | ICD-10-CM

## 2014-10-10 DIAGNOSIS — Z125 Encounter for screening for malignant neoplasm of prostate: Secondary | ICD-10-CM | POA: Diagnosis not present

## 2014-10-10 DIAGNOSIS — Z1211 Encounter for screening for malignant neoplasm of colon: Secondary | ICD-10-CM | POA: Diagnosis not present

## 2014-10-10 DIAGNOSIS — R7309 Other abnormal glucose: Secondary | ICD-10-CM

## 2014-10-10 DIAGNOSIS — R7303 Prediabetes: Secondary | ICD-10-CM

## 2014-10-10 DIAGNOSIS — K029 Dental caries, unspecified: Secondary | ICD-10-CM

## 2014-10-10 LAB — POC HEMOCCULT BLD/STL (OFFICE/1-CARD/DIAGNOSTIC): Fecal Occult Blood, POC: NEGATIVE

## 2014-10-10 NOTE — Patient Instructions (Signed)
Annual wellness 12/15 or after , call if you need me sooner  Fasting lipid, cmp, TSH, PSA as soon as possible, past due  It is important that you exercise regularly at least 30 minutes 5 times a week. If you develop chest pain, have severe difficulty breathing, or feel very tired, stop exercising immediately and seek medical attention    You are referred to dermatology about lesion on your back  Please get colonoscopy when you return  I am excited about your grand baby  Fasting lipid, cmp and EGFr, pSA tomorrow morning  Thanks for choosing [facility], we consider it a privelige to serve you.

## 2014-10-10 NOTE — Assessment & Plan Note (Signed)

## 2014-10-11 LAB — TSH: TSH: 5.043 u[IU]/mL — ABNORMAL HIGH (ref 0.350–4.500)

## 2014-10-11 LAB — COMPREHENSIVE METABOLIC PANEL
ALBUMIN: 3.9 g/dL (ref 3.5–5.2)
ALK PHOS: 80 U/L (ref 39–117)
ALT: 8 U/L (ref 0–53)
AST: 13 U/L (ref 0–37)
BUN: 13 mg/dL (ref 6–23)
CO2: 28 mEq/L (ref 19–32)
Calcium: 9 mg/dL (ref 8.4–10.5)
Chloride: 107 mEq/L (ref 96–112)
Creat: 1.53 mg/dL — ABNORMAL HIGH (ref 0.50–1.35)
GLUCOSE: 107 mg/dL — AB (ref 70–99)
Potassium: 4.8 mEq/L (ref 3.5–5.3)
SODIUM: 143 meq/L (ref 135–145)
TOTAL PROTEIN: 5.5 g/dL — AB (ref 6.0–8.3)
Total Bilirubin: 0.7 mg/dL (ref 0.2–1.2)

## 2014-10-11 LAB — LIPID PANEL
CHOLESTEROL: 264 mg/dL — AB (ref 0–200)
HDL: 18 mg/dL — ABNORMAL LOW (ref >=40)
LDL Cholesterol: 174 mg/dL — ABNORMAL HIGH (ref 0–99)
TRIGLYCERIDES: 358 mg/dL — AB (ref <150)
Total CHOL/HDL Ratio: 14.7 Ratio
VLDL: 72 mg/dL — ABNORMAL HIGH (ref 0–40)

## 2014-10-12 LAB — PSA, MEDICARE: PSA: 0.05 ng/mL (ref <=4.00)

## 2014-10-13 ENCOUNTER — Telehealth: Payer: Self-pay | Admitting: Family Medicine

## 2014-10-13 NOTE — Assessment & Plan Note (Signed)
The few remainig teeth are infected and broken , pt to have them removed in the near future,as able

## 2014-10-13 NOTE — Progress Notes (Signed)
Patient is in for annual physical exam. No other health concerns are expressed or addressed at the visit.  Immunization is reviewed , and  updated if needed.   Pleasant well nourished male, alert and oriented x 3, in no cardio-pulmonary distress. Afebrile. HEENT No facial trauma or asymetry. Sinuses non tender. EOMI, PERTL, External ears normal, tympanic membranes clear. Oropharynx moist, no exudate, poordentition. Neck: supple, no adenopathy,JVD or thyromegaly.No bruits.  Chest: Clear to ascultation bilaterally.No crackles or wheezes. Non tender to palpation  Breast: No asymetry,no masses. No nipple discharge or inversion. No axillary or supraclavicular adenopathy  Cardiovascular system; Heart sounds normal,  S1 and  S2 ,no S3.  systolic murmur, no thrill. Apical beat not displaced Peripheral pulses normal.  Abdomen: Soft, non tender, no organomegaly or masses. No bruits. Bowel sounds normal. No guarding, tenderness or rebound.  Rectal:  Normal sphincter tone.  guaiac negative stool. Prostate smooth and firm and atrophic  GU: Not examined .  Musculoskeletal exam: Adequate though reduced  ROM of spine, hips , shoulders and knees. No deformity ,swelling or crepitus noted. No muscle wasting or atrophy.   Neurologic: Cranial nerves 2 to 12 intact. Power, tone ,sensation and reflexes normal throughout. No disturbance in gait. No tremor.  Skin: Intact, no ulceration or  erythema , hyperpigmented skin lesion on right mid back, irregular border and  Varied pigmentation, rough surface, diameter approx 1.5 cm.  Psych; Normal mood and affect. Judgement and concentration normal  A/P:  Annual physical exam Annual exam as documented. Counseling done  re healthy lifestyle involving commitment to 150 minutes exercise per week, heart healthy diet, and attaining healthy weight.The importance of adequate sleep also discussed. Regular seat belt use and home safety, is also  discussed. Changes in health habits are decided on by the patient with goals and time frames  set for achieving them. Immunization and cancer screening needs are specifically addressed at this visit.   Pigmented skin lesion Pigmented lesion in mid back, irregular border and pigmentation, refer to dermatology for further eval  Dental caries The few remainig teeth are infected and broken , pt to have them removed in the near future,as able  Essential hypertension, benign Controlled, no change in medication DASH diet and commitment to daily physical activity for a minimum of 30 minutes discussed and encouraged, as a part of hypertension management. The importance of attaining a healthy weight is also discussed.  BP/Weight 10/10/2014 09/06/2014 07/26/2014 07/23/2014 06/14/2014 11/06/5724 2/0/3559  Systolic BP 741 638 453 646 803 212 248  Diastolic BP 78 58 66 66 59 72 67  Wt. (Lbs) 242.08 - - 230 - 233 228.6  BMI 34.73 - - 33 - 33.43 32.8        Mixed hyperlipidemia Uncontrolled, change to crestor Hyperlipidemia:Low fat diet discussed and encouraged.   Lipid Panel  Lab Results  Component Value Date   CHOL 264* 10/10/2014   HDL 18* 10/10/2014   LDLCALC 174* 10/10/2014   TRIG 358* 10/10/2014   CHOLHDL 14.7 10/10/2014        Obesity Deteriorated. Patient re-educated about  the importance of commitment to a  minimum of 150 minutes of exercise per week.  The importance of healthy food choices with portion control discussed. Encouraged to start a food diary, count calories and to consider  joining a support group. Sample diet sheets offered. Goals set by the patient for the next several months.   Weight /BMI 10/10/2014 07/23/2014 06/11/2014  WEIGHT 242 lb 1.3 oz  230 lb 233 lb  HEIGHT 5\' 10"  5\' 10"  5\' 10"   BMI 34.73 kg/m2 33 kg/m2 33.43 kg/m2    Current exercise per week 60 minutes.

## 2014-10-13 NOTE — Assessment & Plan Note (Signed)
Deteriorated. Patient re-educated about  the importance of commitment to a  minimum of 150 minutes of exercise per week.  The importance of healthy food choices with portion control discussed. Encouraged to start a food diary, count calories and to consider  joining a support group. Sample diet sheets offered. Goals set by the patient for the next several months.   Weight /BMI 10/10/2014 07/23/2014 06/11/2014  WEIGHT 242 lb 1.3 oz 230 lb 233 lb  HEIGHT 5\' 10"  5\' 10"  5\' 10"   BMI 34.73 kg/m2 33 kg/m2 33.43 kg/m2    Current exercise per week 60 minutes.

## 2014-10-13 NOTE — Assessment & Plan Note (Signed)
Pigmented lesion in mid back, irregular border and pigmentation, refer to dermatology for further eval

## 2014-10-13 NOTE — Assessment & Plan Note (Signed)
Controlled, no change in medication DASH diet and commitment to daily physical activity for a minimum of 30 minutes discussed and encouraged, as a part of hypertension management. The importance of attaining a healthy weight is also discussed.  BP/Weight 10/10/2014 09/06/2014 07/26/2014 07/23/2014 06/14/2014 3/81/7711 08/31/7901  Systolic BP 833 383 291 916 606 004 599  Diastolic BP 78 58 66 66 59 72 67  Wt. (Lbs) 242.08 - - 230 - 233 228.6  BMI 34.73 - - 33 - 33.43 32.8

## 2014-10-13 NOTE — Telephone Encounter (Signed)
I see pt is on zocor 40 mg , since ; lipids are so high , I recommend change to crestor 40 mg discuss with pt and send in new med please, this is an addend to his result note, thanks  ?? pls ask

## 2014-10-13 NOTE — Assessment & Plan Note (Signed)
Uncontrolled, change to crestor Hyperlipidemia:Low fat diet discussed and encouraged.   Lipid Panel  Lab Results  Component Value Date   CHOL 264* 10/10/2014   HDL 18* 10/10/2014   LDLCALC 174* 10/10/2014   TRIG 358* 10/10/2014   CHOLHDL 14.7 10/10/2014

## 2014-10-14 LAB — T3, FREE: T3, Free: 3 pg/mL (ref 2.3–4.2)

## 2014-10-14 LAB — T4, FREE: FREE T4: 1.14 ng/dL (ref 0.80–1.80)

## 2014-10-14 NOTE — Telephone Encounter (Signed)
Called patient and left message for them to return call at the office   

## 2014-10-14 NOTE — Telephone Encounter (Signed)
PLS EXPLAIN THE CHOLESTEROL IS HIGHER NOW THAN aPRIL so needs TO CHANGE DIET, AND PLS INC THE ZOCOR TO 80 MG ONE DAILY, HE MAY TAKE TWO 40 MG TABS TILL DONE, NEEDS REPT LIPI AND HEPATIC FASTING ION 3 MONTHS, PLS SEND IN NEW ORDER ETC ALSO

## 2014-10-14 NOTE — Telephone Encounter (Signed)
States he has been on crestor before and it didn't work and Dr Domenic Polite put him on zocor 57 and told him at his last visit to continue it. Please advise

## 2014-10-18 ENCOUNTER — Encounter (HOSPITAL_COMMUNITY): Payer: Medicare Other | Attending: Oncology | Admitting: Hematology & Oncology

## 2014-10-18 ENCOUNTER — Encounter (HOSPITAL_COMMUNITY): Payer: Medicare Other | Attending: Hematology & Oncology

## 2014-10-18 ENCOUNTER — Encounter (HOSPITAL_COMMUNITY): Payer: Self-pay | Admitting: Hematology & Oncology

## 2014-10-18 ENCOUNTER — Ambulatory Visit (HOSPITAL_COMMUNITY): Payer: Medicare Other | Admitting: Hematology & Oncology

## 2014-10-18 VITALS — BP 112/49 | HR 70 | Temp 98.0°F | Resp 18 | Wt 240.3 lb

## 2014-10-18 DIAGNOSIS — N183 Chronic kidney disease, stage 3 (moderate): Secondary | ICD-10-CM

## 2014-10-18 DIAGNOSIS — C8190 Hodgkin lymphoma, unspecified, unspecified site: Secondary | ICD-10-CM | POA: Diagnosis not present

## 2014-10-18 DIAGNOSIS — C9110 Chronic lymphocytic leukemia of B-cell type not having achieved remission: Secondary | ICD-10-CM

## 2014-10-18 DIAGNOSIS — D801 Nonfamilial hypogammaglobulinemia: Secondary | ICD-10-CM | POA: Diagnosis not present

## 2014-10-18 DIAGNOSIS — Z452 Encounter for adjustment and management of vascular access device: Secondary | ICD-10-CM | POA: Diagnosis not present

## 2014-10-18 DIAGNOSIS — Z8572 Personal history of non-Hodgkin lymphomas: Secondary | ICD-10-CM

## 2014-10-18 DIAGNOSIS — Z95828 Presence of other vascular implants and grafts: Secondary | ICD-10-CM

## 2014-10-18 DIAGNOSIS — Z23 Encounter for immunization: Secondary | ICD-10-CM | POA: Insufficient documentation

## 2014-10-18 LAB — CBC WITH DIFFERENTIAL/PLATELET
BASOS ABS: 0 10*3/uL (ref 0.0–0.1)
BASOS PCT: 0 % (ref 0–1)
Eosinophils Absolute: 1 10*3/uL — ABNORMAL HIGH (ref 0.0–0.7)
Eosinophils Relative: 4 % (ref 0–5)
HEMATOCRIT: 37.9 % — AB (ref 39.0–52.0)
Hemoglobin: 12.6 g/dL — ABNORMAL LOW (ref 13.0–17.0)
LYMPHS PCT: 64 % — AB (ref 12–46)
Lymphs Abs: 15.8 10*3/uL — ABNORMAL HIGH (ref 0.7–4.0)
MCH: 33 pg (ref 26.0–34.0)
MCHC: 33.2 g/dL (ref 30.0–36.0)
MCV: 99.2 fL (ref 78.0–100.0)
MONO ABS: 0 10*3/uL — AB (ref 0.1–1.0)
MONOS PCT: 0 % — AB (ref 3–12)
Neutro Abs: 7.9 10*3/uL — ABNORMAL HIGH (ref 1.7–7.7)
Neutrophils Relative %: 32 % — ABNORMAL LOW (ref 43–77)
Platelets: 119 10*3/uL — ABNORMAL LOW (ref 150–400)
RBC: 3.82 MIL/uL — ABNORMAL LOW (ref 4.22–5.81)
RDW: 15.8 % — AB (ref 11.5–15.5)
WBC: 24.7 10*3/uL — AB (ref 4.0–10.5)

## 2014-10-18 LAB — COMPREHENSIVE METABOLIC PANEL
ALBUMIN: 4 g/dL (ref 3.5–5.0)
ALT: 16 U/L — ABNORMAL LOW (ref 17–63)
AST: 19 U/L (ref 15–41)
Alkaline Phosphatase: 98 U/L (ref 38–126)
Anion gap: 8 (ref 5–15)
BILIRUBIN TOTAL: 0.6 mg/dL (ref 0.3–1.2)
BUN: 17 mg/dL (ref 6–20)
CHLORIDE: 107 mmol/L (ref 101–111)
CO2: 24 mmol/L (ref 22–32)
CREATININE: 1.86 mg/dL — AB (ref 0.61–1.24)
Calcium: 8.9 mg/dL (ref 8.9–10.3)
GFR calc non Af Amer: 35 mL/min — ABNORMAL LOW (ref >60)
GFR, EST AFRICAN AMERICAN: 41 mL/min — AB (ref >60)
Glucose, Bld: 148 mg/dL — ABNORMAL HIGH (ref 65–99)
POTASSIUM: 4.5 mmol/L (ref 3.5–5.1)
SODIUM: 139 mmol/L (ref 135–145)
Total Protein: 6.2 g/dL — ABNORMAL LOW (ref 6.5–8.1)

## 2014-10-18 LAB — LACTATE DEHYDROGENASE: LDH: 183 U/L (ref 98–192)

## 2014-10-18 MED ORDER — SODIUM CHLORIDE 0.9 % IJ SOLN
10.0000 mL | INTRAMUSCULAR | Status: DC | PRN
  Administered 2014-10-18: 10 mL via INTRAVENOUS
  Filled 2014-10-18: qty 10

## 2014-10-18 MED ORDER — HEPARIN SOD (PORK) LOCK FLUSH 100 UNIT/ML IV SOLN
500.0000 [IU] | Freq: Once | INTRAVENOUS | Status: AC
  Administered 2014-10-18: 500 [IU] via INTRAVENOUS

## 2014-10-18 NOTE — Progress Notes (Signed)
[name], MD [address], [address] [place] [address]  DIAGNOSIS: Hodgkin's disease   Staging form: Lymphoid Neoplasms, AJCC 6th Edition     Clinical: Stage III - Signed by Baird Cancer, PA on 09/14/2010 CLL CKD Stage IIIA Hypogammaglobulenemia  SUMMARY OF ONCOLOGIC HISTORY:   Hodgkin's disease   10/02/2007 Initial Diagnosis HODGKIN'S DISEASE    CURRENT THERAPY: Observation  INTERVAL HISTORY: [name] 69 y.o. male returns for follow-up of CLL. He also has a history of Hodgkin Lymphoma. He has no major complaints today.  He denies night sweats, fever or chills. His appetite is fairly good. His energy is baseline and unchanged. He is excited as he has been told he is having a new grandchild.   The patient is here alone today and says that he has been doing overall the same.  He will have blood work done today.    His son and his friend whom live with him have been helping him take care of the house and his animals.  He says that he would not be able to do this on his own.  The patient says that he can no longer do what he used to do, but notes that has been since his treatment of his Hodgkin Lymphoma. He has complaints of a cough and notes that this is intermittently chronic.  He denies SOB. The patient gets his port flushed every 6 weeks. He has never smoked however he has exposure to second hand smoke from both his son and friend. (As a widow, he begins mourning in the examination room about his wife.  He also gets emotional when talking about his son's accomplishments on becoming a psychiatrist.)  MEDICAL HISTORY: Past Medical History  Diagnosis Date  . Aortic stenosis   . Atrial fibrillation   . Coronary atherosclerosis of native coronary artery   . Hyperlipidemia   . Essential hypertension, benign   . BPH (benign prostatic hypertrophy)   . Arthritis   . Acid reflux   . Hypogammaglobulinemia   . Pre-diabetes 08/04/2011  . CLL (chronic  lymphoblastic leukemia)   . Hodgkin's disease   . Kidney stones   . Wears glasses   . Wears dentures   . HOH (hard of hearing)   . Indirect inguinal hernia 04/13/2013    has Hodgkin's disease; Leukemia, lymphocytic, chronic; Mixed hyperlipidemia; Obesity; CORONARY ATHEROSCLEROSIS NATIVE CORONARY ARTERY; Aortic valve disorder; Atrial fibrillation; AORTIC VALVE REPLACEMENT, HX OF; Essential hypertension, benign; GERD (gastroesophageal reflux disease); Thrombocytopenia; Hypogammaglobulinemia, acquired; Anemia; Pre-diabetes; Dental caries; Piles (hemorrhoids); Reduced vision; Neck mass; Cholelithiasis; Indirect inguinal hernia; Chronic kidney disease (CKD); Annual physical exam; and Pigmented skin lesion on his problem list.     has No Known Allergies.  We administered heparin lock flush and sodium chloride.  SURGICAL HISTORY: Past Surgical History  Procedure Laterality Date  . Aortic valve replacement  10/09    75mm Magna Pericardial   . Esophagogastroduodenoscopy      scrapping of throat and stretching  . Lithotripsy    . Bone marrow aspiration    . Portacath placement    . 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 Colon surgery    . Dental surgery  08/2012  . Multiple tooth extractions    . Mass excision N/A 12/05/2012    Procedure: EXCISION MIDLINE NECK MASS;  Surgeon: Ascencion Dike, MD;  Location: [facility];  Service: ENT;  Laterality: N/A;  . Tongue biopsy  12/2012  . Cataract extraction w/phaco Right 01/07/2014    Procedure: CATARACT EXTRACTION PHACO AND INTRAOCULAR LENS PLACEMENT (IOC);  Surgeon: Tonny Branch, MD;  Location: [facility];  Service: Ophthalmology;  Laterality: Right;  CDE:15.31  . Eye surgery Right 02/07/2014    cataract  . Cataract extraction w/phaco Left 03/25/2014    Procedure: CATARACT EXTRACTION PHACO AND INTRAOCULAR LENS PLACEMENT (IOC);  Surgeon: Tonny Branch, MD;  Location: [facility];  Service: Ophthalmology;  Laterality: Left;  CDE 6.55     SOCIAL HISTORY: Social History   Social History  . Marital Status: Widowed    Spouse Name: N/A  . Number of Children: N/A  . Years of Education: N/A   Occupational History  . Not on file.   Social History Main Topics  . Smoking status: Never Smoker   . Smokeless tobacco: Never Used     Comment: pt denies tobacco use   . Alcohol Use: No     Comment: pt denies alcohol   . Drug Use: No  . Sexual Activity: Not Currently   Other Topics Concern  . Not on file   Social History Narrative    FAMILY HISTORY: Family History  Problem Relation Age of Onset  . Heart failure Mother   . Blindness Mother   . Cancer Father 96    Lung   . Cancer Brother 50    acute leukemia     Review of Systems  Constitutional: Negative.   HENT: Negative.   Eyes: Negative.   Respiratory: Negative.   Cardiovascular: Negative.   Gastrointestinal: Negative.   Genitourinary:       Nocturia  Skin: Negative.   Neurological: Negative.   Endo/Heme/Allergies: Negative.   Psychiatric/Behavioral: Negative.   14 point review of systems was performed and is negative except as detailed under history of present illness and above   PHYSICAL EXAMINATION  ECOG PERFORMANCE STATUS: 0 - Asymptomatic  Filed Vitals:   10/18/14 1000  BP: 112/49  Pulse: 70  Temp: 98 F (36.7 C)  Resp: 18    Physical Exam  Constitutional: He is oriented to person, place, and time and well-developed, well-nourished, and in no distress.  HENT:  Head: Normocephalic and atraumatic.  Nose: Nose normal.  Mouth/Throat: Oropharynx is clear and moist. No oropharyngeal exudate.  Eyes: Conjunctivae and EOM are normal. Pupils are equal, round, and reactive to light. Right eye exhibits no discharge. Left eye exhibits no discharge. No scleral icterus.  Neck: Normal range of motion. Neck supple. No tracheal deviation present. No thyromegaly present.  Cardiovascular: Normal rate, regular rhythm and normal heart sounds.  Exam  reveals no gallop and no friction rub.   No murmur heard. Pulmonary/Chest:  Rhonchi and wheezing in both lungs Clear with cough Abdominal: Soft. Bowel sounds are normal. He exhibits no distension and no mass. There is no tenderness. There is no rebound and no guarding.  Musculoskeletal: Normal range of motion. He exhibits no edema.  Lymphadenopathy:    He has no cervical adenopathy.  Neurological: He is alert and oriented to person, place, and time. He has normal reflexes. No cranial nerve deficit. Gait normal. Coordination normal.  Skin: Skin is warm and dry. No rash noted.  Psychiatric: Mood, memory, affect and judgment normal.  Nursing note and vitals reviewed.   LABORATORY DATA:  CBC    Component Value Date/Time   WBC 24.7* 10/18/2014 1045   RBC 3.82* 10/18/2014 1045   RBC 3.43*  02/08/2014 1146   HGB 12.6* 10/18/2014 1045   HCT 37.9* 10/18/2014 1045   PLT 119* 10/18/2014 1045   MCV 99.2 10/18/2014 1045   MCH 33.0 10/18/2014 1045   MCHC 33.2 10/18/2014 1045   RDW 15.8* 10/18/2014 1045   LYMPHSABS 15.8* 10/18/2014 1045   MONOABS 0.0* 10/18/2014 1045   EOSABS 1.0* 10/18/2014 1045   BASOSABS 0.0 10/18/2014 1045   CMP     Component Value Date/Time   NA 139 10/18/2014 1045   K 4.5 10/18/2014 1045   CL 107 10/18/2014 1045   CO2 24 10/18/2014 1045   GLUCOSE 148* 10/18/2014 1045   BUN 17 10/18/2014 1045   CREATININE 1.86* 10/18/2014 1045   CREATININE 1.53* 10/10/2014 0923   CALCIUM 8.9 10/18/2014 1045   PROT 6.2* 10/18/2014 1045   ALBUMIN 4.0 10/18/2014 1045   AST 19 10/18/2014 1045   ALT 16* 10/18/2014 1045   ALKPHOS 98 10/18/2014 1045   BILITOT 0.6 10/18/2014 1045   GFRNONAA 35* 10/18/2014 1045   GFRAA 41* 10/18/2014 1045     ASSESSMENT and THERAPY PLAN:  DIAGNOSIS: Hodgkin's disease   Staging form: Lymphoid Neoplasms, AJCC 6th Edition     Clinical: Stage III - Signed by Baird Cancer, PA on 09/14/2010 CLL CKD Stage IIIA Hypogammaglobulenemia  He has  wheezing on today's pulmonary exam but he states he feels this is allergies as this intermittently occurs. I have encouraged him to notify us or Dr. Moshe Cipro if his symptoms do not improve. He otherwise seems to be doing well counts are fairly stable. We will continue to follow him on a regular basis, 3 months with repeat physical exam and laboratory studies.  All questions were answered. The patient knows to call the clinic with any problems, questions or concerns. We can certainly see the patient much sooner if necessary.   Orders Placed This Encounter  Procedures  . CBC with Differential    Standing Status: Future     Number of Occurrences:      Standing Expiration Date: 10/18/2015  . Comprehensive metabolic panel    Standing Status: Future     Number of Occurrences:      Standing Expiration Date: 10/18/2015  . Lactate dehydrogenase    Standing Status: Future     Number of Occurrences:      Standing Expiration Date: 10/18/2015  . Schedule Portacath Flush Appointment    Schedule Portacath flush appointment    This document serves as a record of services personally performed by Ancil Linsey, MD. It was created on her behalf by Janace Hoard, a trained medical scribe. The creation of this record is based on the scribe's personal observations and the provider's statements to them. This document has been checked and approved by the attending provider.  I have reviewed the above documentation for accuracy and completeness, and I agree with the above.  This note was electronically signed.    Kelby Fam. Whitney Muse, MD

## 2014-10-18 NOTE — Patient Instructions (Signed)
[place] at [facility] Discharge Instructions  RECOMMENDATIONS MADE BY THE CONSULTANT AND ANY TEST RESULTS WILL BE SENT TO YOUR REFERRING PHYSICIAN.  Exam completed by Dr Whitney Muse today Port flush today and lab work Port flushes every 6-8 weeks  Return to see the doctor in 3 months Please call the clinic if you have any questions or concerns  Thank you for choosing [place] at [facility] to provide your oncology and hematology care.  To afford each patient quality time with our provider, please arrive at least 15 minutes before your scheduled appointment time.    You need to re-schedule your appointment should you arrive 10 or more minutes late.  We strive to give you quality time with our providers, and arriving late affects you and other patients whose appointments are after yours.  Also, if you no show three or more times for appointments you may be dismissed from the clinic at the providers discretion.     Again, thank you for choosing [facility].  Our hope is that these requests will decrease the amount of time that you wait before being seen by our physicians.       _____________________________________________________________  Should you have questions after your visit to [facility], please contact our office at [phone] between the hours of 8:30 a.m. and 4:30 p.m.  Voicemails left after 4:30 p.m. will not be returned until the following business day.  For prescription refill requests, have your pharmacy contact our office.

## 2014-10-18 NOTE — Progress Notes (Signed)
[name] presented for Portacath access and flush.  Proper placement of portacath confirmed by CXR.  Portacath located right chest wall accessed with  H 20 needle.  sluggish blood return present. Portacath flushed with 40ml NS and 500U/72ml Heparin and needle removed intact.  Procedure tolerated well and without incident.

## 2014-10-18 NOTE — Progress Notes (Signed)
See md encounter

## 2014-10-20 LAB — KAPPA/LAMBDA LIGHT CHAINS
KAPPA FREE LGHT CHN: 6.56 mg/L (ref 3.30–19.40)
Kappa, lambda light chain ratio: 0.25 — ABNORMAL LOW (ref 0.26–1.65)
LAMDA FREE LIGHT CHAINS: 26.62 mg/L — AB (ref 5.71–26.30)

## 2014-10-22 LAB — MULTIPLE MYELOMA PANEL, SERUM
ALPHA 1: 0.2 g/dL (ref 0.0–0.4)
ALPHA2 GLOB SERPL ELPH-MCNC: 1.1 g/dL — AB (ref 0.4–1.0)
Albumin SerPl Elph-Mcnc: 3.6 g/dL (ref 2.9–4.4)
Albumin/Glob SerPl: 1.7 (ref 0.7–1.7)
B-GLOBULIN SERPL ELPH-MCNC: 0.8 g/dL (ref 0.7–1.3)
GLOBULIN, TOTAL: 2.2 g/dL (ref 2.2–3.9)
Gamma Glob SerPl Elph-Mcnc: 0.1 g/dL — ABNORMAL LOW (ref 0.4–1.8)
IgG (Immunoglobin G), Serum: 70 mg/dL — ABNORMAL LOW (ref 700–1600)
IgM, Serum: <6 mg/dL — ABNORMAL LOW (ref 20–172)
Total Protein ELP: 5.8 g/dL — ABNORMAL LOW (ref 6.0–8.5)

## 2014-11-20 ENCOUNTER — Encounter (HOSPITAL_COMMUNITY): Payer: Self-pay | Admitting: Hematology & Oncology

## 2014-11-21 NOTE — Telephone Encounter (Signed)
Patient states his cardiology told him to stay on the 40mg  dose

## 2014-12-09 ENCOUNTER — Encounter (HOSPITAL_COMMUNITY): Payer: Self-pay

## 2014-12-09 ENCOUNTER — Encounter (HOSPITAL_COMMUNITY): Payer: Medicare Other | Attending: Oncology

## 2014-12-09 DIAGNOSIS — C8190 Hodgkin lymphoma, unspecified, unspecified site: Secondary | ICD-10-CM | POA: Insufficient documentation

## 2014-12-09 DIAGNOSIS — C9110 Chronic lymphocytic leukemia of B-cell type not having achieved remission: Secondary | ICD-10-CM | POA: Insufficient documentation

## 2014-12-09 DIAGNOSIS — Z452 Encounter for adjustment and management of vascular access device: Secondary | ICD-10-CM | POA: Diagnosis not present

## 2014-12-09 DIAGNOSIS — Z23 Encounter for immunization: Secondary | ICD-10-CM | POA: Insufficient documentation

## 2014-12-09 MED ORDER — SODIUM CHLORIDE 0.9 % IJ SOLN: 10.0000 mL | INTRAMUSCULAR | Status: DC | PRN

## 2014-12-09 MED ORDER — SODIUM CHLORIDE 0.9 % IJ SOLN
10.0000 mL | INTRAMUSCULAR | Status: DC | PRN
  Administered 2014-12-09: 10 mL via INTRAVENOUS
  Filled 2014-12-09: qty 10

## 2014-12-09 MED ORDER — HEPARIN SOD (PORK) LOCK FLUSH 100 UNIT/ML IV SOLN: 500.0000 [IU] | Freq: Once | INTRAVENOUS | Status: DC

## 2014-12-09 MED ORDER — HEPARIN SOD (PORK) LOCK FLUSH 100 UNIT/ML IV SOLN
500.0000 [IU] | Freq: Once | INTRAVENOUS | Status: AC
  Administered 2014-12-09: 500 [IU] via INTRAVENOUS

## 2014-12-09 NOTE — Progress Notes (Signed)
[name] presented for Portacath access and flush.  .  Portacath located right chest wall accessed with  H 20 needle.  Good blood return present. Portacath flushed with 20ml NS and 500U/5ml Heparin and needle removed intact.  Procedure tolerated well and without incident.   

## 2014-12-09 NOTE — Patient Instructions (Signed)
[facility] at [facility] Discharge Instructions  RECOMMENDATIONS MADE BY THE CONSULTANT AND ANY TEST RESULTS WILL BE SENT TO YOUR REFERRING PHYSICIAN.  Port flush today. Return as scheduled for lab work and office visit.   Thank you for choosing [facility] at [facility] to provide your oncology and hematology care.  To afford each patient quality time with our provider, please arrive at least 15 minutes before your scheduled appointment time.    You need to re-schedule your appointment should you arrive 10 or more minutes late.  We strive to give you quality time with our providers, and arriving late affects you and other patients whose appointments are after yours.  Also, if you no show three or more times for appointments you may be dismissed from the clinic at the providers discretion.     Again, thank you for choosing [facility].  Our hope is that these requests will decrease the amount of time that you wait before being seen by our physicians.       _____________________________________________________________  Should you have questions after your visit to [facility], please contact our office at [phone] between the hours of 8:30 a.m. and 4:30 p.m.  Voicemails left after 4:30 p.m. will not be returned until the following business day.  For prescription refill requests, have your pharmacy contact our office.    

## 2014-12-10 ENCOUNTER — Telehealth: Payer: Self-pay

## 2014-12-10 NOTE — Telephone Encounter (Signed)
Pt came by the office to see when his next colonoscopy is due. He was referred by his PCP a few months ago. He is not having any GI problems and no family hx of colon cancer. His last colonoscopy was 08/2006 with Dr. Gala Romney and he is on recall for 2018. He is aware and he will call if any problems before then.

## 2014-12-12 NOTE — Telephone Encounter (Signed)
Noted  

## 2014-12-13 ENCOUNTER — Encounter (HOSPITAL_COMMUNITY): Payer: Medicare Other

## 2015-01-15 NOTE — Assessment & Plan Note (Signed)
Multifactorial with participating causes including history of treatment for Hodgkin's lymphoma and CLL.  Labs as outlined above.

## 2015-01-15 NOTE — Assessment & Plan Note (Signed)
Multifactorial secondary to past treatment, CLL, and Stage III chronic renal disease.  Labs as discussed above.

## 2015-01-15 NOTE — Progress Notes (Signed)
      Margaret Simpson, MD [address], [address] [place] [address]  Leukemia, lymphocytic, chronic (HCC) - Plan: CBC with Differential, Lactate dehydrogenase, Sedimentation rate  Hodgkin lymphoma, unspecified Hodgkin lymphoma type, unspecified body region (HCC) - Plan: CBC with Differential, Comprehensive metabolic panel  Hypogammaglobulinemia, acquired (HCC) - Plan: Kappa/lambda light chains, Beta 2 microglobuline, serum, Multiple myeloma panel, serum, Kappa/lambda light chains  Thrombocytopenia (HCC) - Plan: CBC with Differential, Kappa/lambda light chains, Beta 2 microglobuline, serum, Multiple myeloma panel, serum, Kappa/lambda light chains  Anemia in neoplastic disease - Plan: CBC with Differential, Kappa/lambda light chains, Beta 2 microglobuline, serum, Multiple myeloma panel, serum, Kappa/lambda light chains  CURRENT THERAPY: Surveillance  INTERVAL HISTORY: [name] 69 y.o. male returns for followup of CLL. He also has a history of Hodgkin Lymphoma.  Additionally, he has Stage III chronic renal disease and hypogammaglobulinemia.    Hodgkin's disease (HCC)   10/02/2007 Initial Diagnosis HODGKIN'S DISEASE     I personally reviewed and went over laboratory results with the patient.  The results are noted within this dictation.  Labs will be updated today.  He denies any B symptoms.   His grandson was recently born.  His name is Greyson.  He is doing well.  He enjoys seeing his grandson and has been traveling.   Past Medical History  Diagnosis Date  . Aortic stenosis   . Atrial fibrillation   . Coronary atherosclerosis of native coronary artery   . Hyperlipidemia   . Essential hypertension, benign   . BPH (benign prostatic hypertrophy)   . Arthritis   . Acid reflux   . Hypogammaglobulinemia   . Pre-diabetes 08/04/2011  . CLL (chronic lymphoblastic leukemia)   . Hodgkin's disease   . Kidney stones   . Wears glasses   . Wears dentures   . HOH (hard  of hearing)   . Indirect inguinal hernia 04/13/2013    has Hodgkin's disease (HCC); Leukemia, lymphocytic, chronic (HCC); Mixed hyperlipidemia; Obesity; CORONARY ATHEROSCLEROSIS NATIVE CORONARY ARTERY; Aortic valve disorder; Atrial fibrillation (HCC); AORTIC VALVE REPLACEMENT, HX OF; Essential hypertension, benign; GERD (gastroesophageal reflux disease); Thrombocytopenia (HCC); Hypogammaglobulinemia, acquired (HCC); Anemia; Pre-diabetes; Dental caries; Piles (hemorrhoids); Reduced vision; Neck mass; Cholelithiasis; Indirect inguinal hernia; Chronic kidney disease (CKD); Annual physical exam; and Pigmented skin lesion on his problem list.     has No Known Allergies.  Current Outpatient Prescriptions on File Prior to Visit  Medication Sig Dispense Refill  . acetaminophen (TYLENOL) 500 MG tablet Take 1,000 mg by mouth every 6 (six) hours as needed for fever.     . acyclovir (ZOVIRAX) 200 MG capsule Take 1 capsule (200 mg total) by mouth 2 (two) times daily. 60 capsule 5  . aspirin EC 81 MG tablet Take 81 mg by mouth daily.    . folic acid (FOLVITE) 1 MG tablet Take 1 tablet (1 mg total) by mouth daily. 30 tablet 5  . metoprolol (LOPRESSOR) 50 MG tablet Take 1 tablet (50 mg total) by mouth 2 (two) times daily. 60 tablet 6  . Multiple Vitamins-Minerals (CENTRUM SILVER ULTRA MENS PO) Take 1 tablet by mouth every morning.     . OMEGA 3 1000 MG CAPS Take 1,000 mg by mouth daily.     . omeprazole (PRILOSEC) 20 MG capsule Take 20 mg by mouth every morning.     . simvastatin (ZOCOR) 40 MG tablet Take 1 tablet (40 mg total) by mouth at bedtime. 90 tablet 3     No current facility-administered medications on file prior to visit.    Past Surgical History  Procedure Laterality Date  . 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 Esophagogastroduodenoscopy      scrapping of throat and stretching  . Lithotripsy    . Bone marrow aspiration    . Portacath placement    . Coronary artery  bypass graft  10/09    SVG to RCA  . Colon surgery    . Dental surgery  08/2012  . Multiple tooth extractions    . Mass excision N/A 12/05/2012    Procedure: EXCISION MIDLINE NECK MASS;  Surgeon: Sui W Teoh, MD;  Location: [facility];  Service: ENT;  Laterality: N/A;  . Tongue biopsy  12/2012  . Cataract extraction w/phaco Right 01/07/2014    Procedure: CATARACT EXTRACTION PHACO AND INTRAOCULAR LENS PLACEMENT (IOC);  Surgeon: Kerry Hunt, MD;  Location: [facility];  Service: Ophthalmology;  Laterality: Right;  CDE:15.31  . Eye surgery Right 02/07/2014    cataract  . Cataract extraction w/phaco Left 03/25/2014    Procedure: CATARACT EXTRACTION PHACO AND INTRAOCULAR LENS PLACEMENT (IOC);  Surgeon: Kerry Hunt, MD;  Location: [facility];  Service: Ophthalmology;  Laterality: Left;  CDE 6.55    Denies any headaches, dizziness, double vision, fevers, chills, night sweats, nausea, vomiting, diarrhea, constipation, chest pain, heart palpitations, shortness of breath, blood in stool, black tarry stool, urinary pain, urinary burning, urinary frequency, hematuria.   PHYSICAL EXAMINATION  ECOG PERFORMANCE STATUS: 1 - Symptomatic but completely ambulatory  There were no vitals filed for this visit.  GENERAL:alert, well nourished, well developed, comfortable, cooperative, obese, smiling and malodorous SKIN: skin color, texture, turgor are normal, no rashes or significant lesions HEAD: Normocephalic, No masses, lesions, tenderness or abnormalities EYES: normal, PERRLA, EOMI, Conjunctiva are pink and non-injected EARS: External ears normal OROPHARYNX:lips, buccal mucosa, and tongue normal, mucous membranes are moist and poor dentition  NECK: supple, no adenopathy, trachea midline LYMPH:  no palpable lymphadenopathy BREAST:not examined LUNGS: clear to auscultation and percussion HEART: regular rate & rhythm with a crescendo murmur heard best at LSB 5th ICS. ABDOMEN:abdomen soft, non-tender,  obese, normal bowel sounds and no masses or organomegaly BACK: Back symmetric, no curvature. EXTREMITIES:less then 2 second capillary refill, no joint deformities, effusion, or inflammation, no skin discoloration, no cyanosis  NEURO: alert & oriented x 3 with fluent speech, no focal motor/sensory deficits, gait normal   LABORATORY DATA: CBC    Component Value Date/Time   WBC 24.7* 10/18/2014 1045   RBC 3.82* 10/18/2014 1045   RBC 3.43* 02/08/2014 1146   HGB 12.6* 10/18/2014 1045   HCT 37.9* 10/18/2014 1045   PLT 119* 10/18/2014 1045   MCV 99.2 10/18/2014 1045   MCH 33.0 10/18/2014 1045   MCHC 33.2 10/18/2014 1045   RDW 15.8* 10/18/2014 1045   LYMPHSABS 15.8* 10/18/2014 1045   MONOABS 0.0* 10/18/2014 1045   EOSABS 1.0* 10/18/2014 1045   BASOSABS 0.0 10/18/2014 1045      Chemistry      Component Value Date/Time   NA 139 10/18/2014 1045   K 4.5 10/18/2014 1045   CL 107 10/18/2014 1045   CO2 24 10/18/2014 1045   BUN 17 10/18/2014 1045   CREATININE 1.86* 10/18/2014 1045   CREATININE 1.53* 10/10/2014 0923      Component Value Date/Time   CALCIUM 8.9 10/18/2014 1045   ALKPHOS 98 10/18/2014 1045   AST 19 10/18/2014 1045     ALT 16* 10/18/2014 1045   BILITOT 0.6 10/18/2014 1045      Lab Results  Component Value Date   PROT 6.2* 10/18/2014   ALBUMINELP 65.9 05/03/2014   A1GS 5.8* 05/03/2014   A2GS 15.7* 05/03/2014   BETS 6.0 05/03/2014   BETA2SER 4.6 05/03/2014   GAMS 2.0* 05/03/2014   MSPIKE NOT DETECTED 05/03/2014   SPEI (NOTE) 05/03/2014   SPECOM (NOTE) 05/03/2014   IGGSERUM 70* 10/18/2014   IGA <6* 10/18/2014   IGMSERUM <6* 10/18/2014   IMMELINT (NOTE) 05/03/2014   KPAFRELGTCHN 6.56 10/18/2014   LAMBDASER 26.62* 10/18/2014   KAPLAMBRATIO 0.25* 10/18/2014     PENDING LABS:   RADIOGRAPHIC STUDIES:  No results found.   PATHOLOGY:    ASSESSMENT AND PLAN:  Leukemia, lymphocytic, chronic (HCC) Pleasant 68-year-old male with CLL. He has had no  treatment to date. He does have an anemia although I think this is more likely from his stage III chronic kidney disease. Given his CLL however this needs to be closely monitored. He has no complaints today such as night sweats, weight loss, change in appetite or energy level. We will continue with ongoing observation.  Labs today as ordered.  Labs in 3 months: CBC diff, CMET, LDH, ESR, MM panel, B2M  Return in 3 months for follow-up.    Hypogammaglobulinemia, acquired (HCC) Secondary to CLL and past treatment.  H/O of recurrent infections requiring IVIG.  IgG, IgA, and IgM levels ordered for 3 month labs.  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  He remains without recurrence and at this point is considered cured of his disease.  We will continue with observation.     Anemia Multifactorial secondary to past treatment, CLL, and Stage III chronic renal disease.  Labs as discussed above.  Thrombocytopenia (HCC) Multifactorial with participating causes including history of treatment for Hodgkin's lymphoma and CLL.  Labs as outlined above.    THERAPY PLAN: Continue with surveillance planned.  All questions were answered. The patient knows to call the clinic with any problems, questions or concerns. We can certainly see the patient much sooner if necessary.  Patient and plan discussed with Dr. Shannon Penland and she is in agreement with the aforementioned.   This note is electronically signed by: KEFALAS,THOMAS, PA-C 01/15/2015 10:07 PM   

## 2015-01-15 NOTE — Assessment & Plan Note (Signed)
Pleasant 69 year old male with CLL. He has had no treatment to date. He does have an anemia although I think this is more likely from his stage III chronic kidney disease. Given his CLL however this needs to be closely monitored. He has no complaints today such as night sweats, weight loss, change in appetite or energy level. We will continue with ongoing observation.  Labs today as ordered.  Labs in 3 months: CBC diff, CMET, LDH, ESR, MM panel, B2M  Return in 3 months for follow-up.

## 2015-01-15 NOTE — Assessment & Plan Note (Signed)
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  He remains without recurrence and at this point is considered cured of his disease.  We will continue with observation.

## 2015-01-15 NOTE — Assessment & Plan Note (Signed)
Secondary to CLL and past treatment.  H/O of recurrent infections requiring IVIG.  IgG, IgA, and IgM levels ordered for 3 month labs.

## 2015-01-16 ENCOUNTER — Encounter (HOSPITAL_COMMUNITY): Payer: Medicare Other | Attending: Oncology | Admitting: Oncology

## 2015-01-16 ENCOUNTER — Encounter (HOSPITAL_COMMUNITY): Payer: Self-pay | Admitting: Oncology

## 2015-01-16 VITALS — BP 145/60 | HR 72 | Temp 98.0°F | Resp 18 | Wt 241.0 lb

## 2015-01-16 DIAGNOSIS — C8190 Hodgkin lymphoma, unspecified, unspecified site: Secondary | ICD-10-CM | POA: Insufficient documentation

## 2015-01-16 DIAGNOSIS — N183 Chronic kidney disease, stage 3 (moderate): Secondary | ICD-10-CM | POA: Diagnosis not present

## 2015-01-16 DIAGNOSIS — Z23 Encounter for immunization: Secondary | ICD-10-CM | POA: Diagnosis not present

## 2015-01-16 DIAGNOSIS — D630 Anemia in neoplastic disease: Secondary | ICD-10-CM

## 2015-01-16 DIAGNOSIS — Z8571 Personal history of Hodgkin lymphoma: Secondary | ICD-10-CM | POA: Diagnosis not present

## 2015-01-16 DIAGNOSIS — D6481 Anemia due to antineoplastic chemotherapy: Secondary | ICD-10-CM

## 2015-01-16 DIAGNOSIS — D696 Thrombocytopenia, unspecified: Secondary | ICD-10-CM

## 2015-01-16 DIAGNOSIS — D801 Nonfamilial hypogammaglobulinemia: Secondary | ICD-10-CM

## 2015-01-16 DIAGNOSIS — C9110 Chronic lymphocytic leukemia of B-cell type not having achieved remission: Secondary | ICD-10-CM | POA: Diagnosis not present

## 2015-01-16 DIAGNOSIS — Z452 Encounter for adjustment and management of vascular access device: Secondary | ICD-10-CM

## 2015-01-16 DIAGNOSIS — Z95828 Presence of other vascular implants and grafts: Secondary | ICD-10-CM

## 2015-01-16 DIAGNOSIS — D631 Anemia in chronic kidney disease: Secondary | ICD-10-CM

## 2015-01-16 LAB — COMPREHENSIVE METABOLIC PANEL
ALT: 12 U/L — ABNORMAL LOW (ref 17–63)
AST: 16 U/L (ref 15–41)
Albumin: 3.8 g/dL (ref 3.5–5.0)
Alkaline Phosphatase: 90 U/L (ref 38–126)
Anion gap: 4 — ABNORMAL LOW (ref 5–15)
BUN: 17 mg/dL (ref 6–20)
CHLORIDE: 109 mmol/L (ref 101–111)
CO2: 26 mmol/L (ref 22–32)
Calcium: 8.8 mg/dL — ABNORMAL LOW (ref 8.9–10.3)
Creatinine, Ser: 1.87 mg/dL — ABNORMAL HIGH (ref 0.61–1.24)
GFR calc Af Amer: 40 mL/min — ABNORMAL LOW (ref >60)
GFR, EST NON AFRICAN AMERICAN: 35 mL/min — AB (ref >60)
Glucose, Bld: 103 mg/dL — ABNORMAL HIGH (ref 65–99)
POTASSIUM: 4.4 mmol/L (ref 3.5–5.1)
Sodium: 139 mmol/L (ref 135–145)
Total Bilirubin: 1.1 mg/dL (ref 0.3–1.2)
Total Protein: 5.9 g/dL — ABNORMAL LOW (ref 6.5–8.1)

## 2015-01-16 LAB — CBC WITH DIFFERENTIAL/PLATELET
BASOS ABS: 0 10*3/uL (ref 0.0–0.1)
Basophils Relative: 0 %
EOS ABS: 0.6 10*3/uL (ref 0.0–0.7)
EOS PCT: 2 %
HCT: 35.3 % — ABNORMAL LOW (ref 39.0–52.0)
Hemoglobin: 11.5 g/dL — ABNORMAL LOW (ref 13.0–17.0)
Lymphocytes Relative: 89 %
Lymphs Abs: 25.7 10*3/uL — ABNORMAL HIGH (ref 0.7–4.0)
MCH: 33.1 pg (ref 26.0–34.0)
MCHC: 32.6 g/dL (ref 30.0–36.0)
MCV: 101.7 fL — AB (ref 78.0–100.0)
Monocytes Absolute: 0.3 10*3/uL (ref 0.1–1.0)
Monocytes Relative: 1 %
NEUTROS ABS: 2.3 10*3/uL (ref 1.7–7.7)
Neutrophils Relative %: 8 %
PLATELETS: 104 10*3/uL — AB (ref 150–400)
RBC: 3.47 MIL/uL — ABNORMAL LOW (ref 4.22–5.81)
RDW: 14.9 % (ref 11.5–15.5)
WBC: 28.9 10*3/uL — AB (ref 4.0–10.5)

## 2015-01-16 LAB — LACTATE DEHYDROGENASE: LDH: 179 U/L (ref 98–192)

## 2015-01-16 MED ORDER — HEPARIN SOD (PORK) LOCK FLUSH 100 UNIT/ML IV SOLN
500.0000 [IU] | Freq: Once | INTRAVENOUS | Status: AC
  Administered 2015-01-16: 500 [IU] via INTRAVENOUS

## 2015-01-16 MED ORDER — SODIUM CHLORIDE 0.9 % IJ SOLN
10.0000 mL | INTRAMUSCULAR | Status: DC | PRN
  Administered 2015-01-16: 10 mL via INTRAVENOUS
  Filled 2015-01-16: qty 10

## 2015-01-16 NOTE — Progress Notes (Signed)
[name] presented for Portacath access and flush.  .  Portacath located right chest wall accessed with  H 20 needle.  Good blood return present. Portacath flushed with 20ml NS and 500U/5ml Heparin and needle removed intact.  Procedure tolerated well and without incident.   

## 2015-01-16 NOTE — Patient Instructions (Addendum)
[place] at [facility] Discharge Instructions  RECOMMENDATIONS MADE BY THE CONSULTANT AND ANY TEST RESULTS WILL BE SENT TO YOUR REFERRING PHYSICIAN.  Port flush with lab work today. Port flush with lab in 3 months. Office visit with Dr. Whitney Muse in 3 months.  Thank you for choosing [place] at [facility] to provide your oncology and hematology care.  To afford each patient quality time with our provider, please arrive at least 15 minutes before your scheduled appointment time.    You need to re-schedule your appointment should you arrive 10 or more minutes late.  We strive to give you quality time with our providers, and arriving late affects you and other patients whose appointments are after yours.  Also, if you no show three or more times for appointments you may be dismissed from the clinic at the providers discretion.     Again, thank you for choosing [facility].  Our hope is that these requests will decrease the amount of time that you wait before being seen by our physicians.       _____________________________________________________________  Should you have questions after your visit to [facility], please contact our office at [phone] between the hours of 8:30 a.m. and 4:30 p.m.  Voicemails left after 4:30 p.m. will not be returned until the following business day.  For prescription refill requests, have your pharmacy contact our office.

## 2015-01-20 ENCOUNTER — Ambulatory Visit (INDEPENDENT_AMBULATORY_CARE_PROVIDER_SITE_OTHER): Payer: Medicare Other | Admitting: Cardiology

## 2015-01-20 ENCOUNTER — Encounter: Payer: Self-pay | Admitting: Cardiology

## 2015-01-20 VITALS — BP 102/62 | HR 84 | Ht 70.0 in | Wt 239.0 lb

## 2015-01-20 DIAGNOSIS — I480 Paroxysmal atrial fibrillation: Secondary | ICD-10-CM

## 2015-01-20 DIAGNOSIS — I359 Nonrheumatic aortic valve disorder, unspecified: Secondary | ICD-10-CM | POA: Diagnosis not present

## 2015-01-20 DIAGNOSIS — I2510 Atherosclerotic heart disease of native coronary artery without angina pectoris: Secondary | ICD-10-CM | POA: Diagnosis not present

## 2015-01-20 NOTE — Patient Instructions (Signed)
Your physician wants you to follow-up in: 6 months with Dr McDowell You will receive a reminder letter in the mail two months in advance. If you don't receive a letter, please call our office to schedule the follow-up appointment.     Your physician recommends that you continue on your current medications as directed. Please refer to the Current Medication list given to you today.    If you need a refill on your cardiac medications before your next appointment, please call your pharmacy.     Thank you for choosing [facility] !        

## 2015-01-20 NOTE — Progress Notes (Signed)
Cardiology Office Note  Date: 01/20/2015   ID: [name], Bartoszek Nov 15, 1945, MRN [id number]  PCP: [name], MD  Primary Cardiologist: Rozann Lesches, MD   Chief Complaint  Patient presents with  . Atrial Fibrillation  . Status post AVR    History of Present Illness: [name] is a 69 y.o. male last seen in April. He presents for a routine follow-up visit. Since last encounter he has not experienced any functional decline overall, no chest pain or dyspnea on exertion beyond NYHA class II. He denies any fevers or chills.  He continues to follow in the Oncology clinic with CLL. I reviewed the recent note.  We reviewed his medications. He continues on aspirin, Lopressor, and Zocor. Rhythm has been stable with no recently documented atrial fibrillation, and we have deferred pursuing anticoagulation based on discussions in the past.  He showed me pictures today of his 53-month-old grandson, [name]. He is very proud.  Past Medical History  Diagnosis Date  . Aortic stenosis   . Atrial fibrillation ([place])   . Coronary atherosclerosis of native coronary artery   . Hyperlipidemia   . Essential hypertension, benign   . BPH (benign prostatic hypertrophy)   . Arthritis   . Acid reflux   . Hypogammaglobulinemia ([place])   . Pre-diabetes 08/04/2011  . CLL (chronic lymphoblastic leukemia)   . Hodgkin's disease   . Kidney stones   . Wears glasses   . Wears dentures   . HOH (hard of hearing)   . Indirect inguinal hernia 04/13/2013    Current Outpatient Prescriptions  Medication Sig Dispense Refill  . acetaminophen (TYLENOL) 500 MG tablet Take 1,000 mg by mouth every 6 (six) hours as needed for fever.     Marland Kitchen acyclovir (ZOVIRAX) 200 MG capsule Take 1 capsule (200 mg total) by mouth 2 (two) times daily. 60 capsule 5  . aspirin EC 81 MG tablet Take 81 mg by mouth daily.    . folic acid (FOLVITE) 1 MG tablet Take 1 tablet (1 mg total) by mouth daily. 30 tablet 5  .  metoprolol (LOPRESSOR) 50 MG tablet Take 1 tablet (50 mg total) by mouth 2 (two) times daily. 60 tablet 6  . Multiple Vitamins-Minerals (CENTRUM SILVER ULTRA MENS PO) Take 1 tablet by mouth every morning.     Marland Kitchen OMEGA 3 1000 MG CAPS Take 1,000 mg by mouth daily.     Marland Kitchen omeprazole (PRILOSEC) 20 MG capsule Take 20 mg by mouth every morning.     . simvastatin (ZOCOR) 40 MG tablet Take 1 tablet (40 mg total) by mouth at bedtime. 90 tablet 3  . sulfamethoxazole-trimethoprim (BACTRIM DS,SEPTRA DS) 800-160 MG tablet      No current facility-administered medications for this visit.    Allergies:  Review of patient's allergies indicates no known allergies.   Social History: The patient  reports that he has never smoked. He has never used smokeless tobacco. He reports that he does not drink alcohol or use illicit drugs.   ROS:  Please see the history of present illness. Otherwise, complete review of systems is positive for chronic fatigue.  All other systems are reviewed and negative.   Physical Exam: VS:  BP 102/62 mmHg  Pulse 84  Ht 5\' 10"  (1.778 m)  Wt 239 lb (108.41 kg)  BMI 34.29 kg/m2  SpO2 96%, BMI Body mass index is 34.29 kg/(m^2).  Wt Readings from Last 3 Encounters:  01/20/15 239 lb (108.41 kg)  01/16/15  241 lb (109.317 kg)  10/18/14 240 lb 4.8 oz (108.999 kg)     Chronically ill-appearing, no distress.  HEENT: Conjunctiva and lids normal, oropharynx with poor dentition.  Neck: Supple, no elevated jugular venous pressure or bruits.  Lungs: Clear to auscultation, nonlabored.  Cardiac: Regular rate and rhythm, S4, 2/6 systolic murmur at the base, no S3.  Abdomen: Obese, nontender, bowel sounds present.  Extremities: Venous stasis noted, 1-2+ edema, distal pulses diminished.   ECG: ECG is not ordered today.  Recent Labwork: 10/10/2014: TSH 5.043* 01/16/2015: ALT 12*; AST 16; BUN 17; Creatinine, Ser 1.87*; Hemoglobin 11.5*; Platelets 104*; Potassium 4.4; Sodium 139       Component Value Date/Time   CHOL 264* 10/10/2014 0923   TRIG 358* 10/10/2014 0923   HDL 18* 10/10/2014 0923   CHOLHDL 14.7 10/10/2014 0923   VLDL 72* 10/10/2014 0923   LDLCALC 174* 10/10/2014 0923    Other Studies Reviewed Today:  Echocardiogram 01/24/2014: Study Conclusions  - Left ventricle: The cavity size was normal. Wall thickness was increased increased in a pattern of mild to moderate LVH. Systolic function was normal. The estimated ejection fraction was in the range of 60% to 65%. Diastolic function is abnormal, indeterminate grade. - Aortic valve: A 23 mm Edwards Magna pericardial valve is in the AV position. Moderately calcified annulus. Mildly thickened leaflets. There was mild regurgitation. Mean gradient (S): 9 mm Hg. VTI ratio of LVOT to aortic valve: 0.48. Valve area (VTI): 2 cm^2. Valve area (Vmax): 1.85 cm^2. Valve area (Vmean): 2.04 cm^2. - Mitral valve: Moderately to severely calcified annulus. Moderately thickened leaflets . The findings are consistent with severe stenosis by mean pressure gradient. There was mild regurgitation. Mean gradient (D): 12 mm Hg. Cannot distinguish PHT from available spectral Doppler tracings, unable to calculate MVA by PHT. The presence of AI prohibits accurate calculation of MVA by continuity equation. Morphologically there appears to be severe mitral stenosis. - Left atrium: The atrium was severely dilated. - Right ventricle: The cavity size was mildly dilated. - Right atrium: The atrium was moderately dilated. - Pulmonary arteries: Systolic pressure was moderately increased. PA peak pressure: 43 mm Hg (S). - Technically difficult study.  ASSESSMENT AND PLAN:  1. Bioprosthetic AVR, symptomatically stable and no significant change on examination. Echocardiogram from 2015 reviewed.  2. Mitral stenosis in the setting of significant mitral annular calcification.  3. CAD status post SVG to  RCA. No active angina symptoms.  Current medicines were reviewed at length with the patient today.  Disposition: FU with me in 6 months.   Signed, Satira Sark, MD, [facility] 01/20/2015 2:19 PM    [facility] at [facility] [address] [place], [place] [address] Phone: [phone]; Fax: [phone]

## 2015-02-07 ENCOUNTER — Encounter (HOSPITAL_COMMUNITY): Payer: Medicare Other

## 2015-02-10 ENCOUNTER — Telehealth: Payer: Self-pay | Admitting: Family Medicine

## 2015-02-10 MED ORDER — BENZONATATE 100 MG PO CAPS: 100.0000 mg | ORAL_CAPSULE | Freq: Two times a day (BID) | ORAL | Status: DC | PRN

## 2015-02-10 MED ORDER — PENICILLIN V POTASSIUM 500 MG PO TABS: 500.0000 mg | ORAL_TABLET | Freq: Three times a day (TID) | ORAL | Status: DC

## 2015-02-10 NOTE — Telephone Encounter (Signed)
Patient is complaining of coughing, congested, aching, please advise?

## 2015-02-10 NOTE — Addendum Note (Signed)
Addended by: Denman George B on: 02/10/2015 03:33 PM   Modules accepted: Orders

## 2015-02-10 NOTE — Telephone Encounter (Signed)
Spoke with patient and he states that he has had symptoms x 2 days.  Tried OTC meds with no relief.  Sputum is green and he has had intermittent chills.  Please advise.

## 2015-02-10 NOTE — Telephone Encounter (Signed)
Patient aware and medication sent to pharmacy  

## 2015-02-10 NOTE — Telephone Encounter (Signed)
pls send 1 week of pen V 500 mg three times daily and tessalon perles 100mg  twice daily for 1 week  Advise if worsens or does not resolve needs to go to ED

## 2015-03-13 ENCOUNTER — Encounter (HOSPITAL_COMMUNITY): Payer: Self-pay

## 2015-03-13 ENCOUNTER — Encounter (HOSPITAL_COMMUNITY): Payer: Medicare Other | Attending: Oncology

## 2015-03-13 DIAGNOSIS — Z452 Encounter for adjustment and management of vascular access device: Secondary | ICD-10-CM | POA: Diagnosis not present

## 2015-03-13 DIAGNOSIS — C8190 Hodgkin lymphoma, unspecified, unspecified site: Secondary | ICD-10-CM | POA: Diagnosis not present

## 2015-03-13 DIAGNOSIS — Z23 Encounter for immunization: Secondary | ICD-10-CM | POA: Diagnosis not present

## 2015-03-13 DIAGNOSIS — D630 Anemia in neoplastic disease: Secondary | ICD-10-CM

## 2015-03-13 DIAGNOSIS — D801 Nonfamilial hypogammaglobulinemia: Secondary | ICD-10-CM

## 2015-03-13 DIAGNOSIS — C9110 Chronic lymphocytic leukemia of B-cell type not having achieved remission: Secondary | ICD-10-CM

## 2015-03-13 DIAGNOSIS — D696 Thrombocytopenia, unspecified: Secondary | ICD-10-CM

## 2015-03-13 LAB — COMPREHENSIVE METABOLIC PANEL
ALT: 13 U/L — AB (ref 17–63)
ANION GAP: 6 (ref 5–15)
AST: 17 U/L (ref 15–41)
Albumin: 3.6 g/dL (ref 3.5–5.0)
Alkaline Phosphatase: 87 U/L (ref 38–126)
BILIRUBIN TOTAL: 0.6 mg/dL (ref 0.3–1.2)
BUN: 15 mg/dL (ref 6–20)
CO2: 25 mmol/L (ref 22–32)
Calcium: 8.6 mg/dL — ABNORMAL LOW (ref 8.9–10.3)
Chloride: 106 mmol/L (ref 101–111)
Creatinine, Ser: 1.81 mg/dL — ABNORMAL HIGH (ref 0.61–1.24)
GFR, EST AFRICAN AMERICAN: 42 mL/min — AB (ref >60)
GFR, EST NON AFRICAN AMERICAN: 36 mL/min — AB (ref >60)
Glucose, Bld: 120 mg/dL — ABNORMAL HIGH (ref 65–99)
POTASSIUM: 4.4 mmol/L (ref 3.5–5.1)
Sodium: 137 mmol/L (ref 135–145)
TOTAL PROTEIN: 5.6 g/dL — AB (ref 6.5–8.1)

## 2015-03-13 LAB — CBC WITH DIFFERENTIAL/PLATELET
Basophils Absolute: 0.1 10*3/uL (ref 0.0–0.1)
Basophils Relative: 0 %
EOS PCT: 3 %
Eosinophils Absolute: 0.9 10*3/uL — ABNORMAL HIGH (ref 0.0–0.7)
HEMATOCRIT: 33.6 % — AB (ref 39.0–52.0)
Hemoglobin: 10.9 g/dL — ABNORMAL LOW (ref 13.0–17.0)
LYMPHS ABS: 27.8 10*3/uL — AB (ref 0.7–4.0)
LYMPHS PCT: 86 %
MCH: 33.2 pg (ref 26.0–34.0)
MCHC: 32.4 g/dL (ref 30.0–36.0)
MCV: 102.4 fL — AB (ref 78.0–100.0)
Monocytes Absolute: 0.7 10*3/uL (ref 0.1–1.0)
Monocytes Relative: 2 %
Neutro Abs: 2.8 10*3/uL (ref 1.7–7.7)
Neutrophils Relative %: 9 %
Platelets: 94 10*3/uL — ABNORMAL LOW (ref 150–400)
RBC: 3.28 MIL/uL — ABNORMAL LOW (ref 4.22–5.81)
RDW: 15.2 % (ref 11.5–15.5)
WBC: 32.3 10*3/uL — AB (ref 4.0–10.5)

## 2015-03-13 LAB — SEDIMENTATION RATE: Sed Rate: 17 mm/hr — ABNORMAL HIGH (ref 0–16)

## 2015-03-13 LAB — LACTATE DEHYDROGENASE: LDH: 177 U/L (ref 98–192)

## 2015-03-13 MED ORDER — HEPARIN SOD (PORK) LOCK FLUSH 100 UNIT/ML IV SOLN
INTRAVENOUS | Status: AC
  Filled 2015-03-13: qty 5

## 2015-03-13 MED ORDER — HEPARIN SOD (PORK) LOCK FLUSH 100 UNIT/ML IV SOLN
500.0000 [IU] | Freq: Once | INTRAVENOUS | Status: AC
  Administered 2015-03-13: 500 [IU] via INTRAVENOUS

## 2015-03-13 MED ORDER — SODIUM CHLORIDE 0.9 % IJ SOLN
10.0000 mL | INTRAMUSCULAR | Status: DC | PRN
  Administered 2015-03-13: 10 mL via INTRAVENOUS
  Filled 2015-03-13: qty 10

## 2015-03-13 NOTE — Progress Notes (Signed)
..  [name] presented for Portacath access and flush.  Portacath located rt chest wall accessed with  H 20 needle.  Good blood return present. Portacath flushed with 96ml NS and 500U/59ml Heparin and needle removed intact.  Procedure tolerated well and without incident.

## 2015-03-14 ENCOUNTER — Telehealth (HOSPITAL_COMMUNITY): Payer: Self-pay | Admitting: Emergency Medicine

## 2015-03-14 LAB — KAPPA/LAMBDA LIGHT CHAINS
Kappa free light chain: 6.29 mg/L (ref 3.30–19.40)
Kappa, lambda light chain ratio: 0.22 — ABNORMAL LOW (ref 0.26–1.65)
LAMDA FREE LIGHT CHAINS: 28.66 mg/L — AB (ref 5.71–26.30)

## 2015-03-14 LAB — BETA 2 MICROGLOBULIN, SERUM: Beta-2 Microglobulin: 5.3 mg/L — ABNORMAL HIGH (ref 0.6–2.4)

## 2015-03-14 NOTE — Telephone Encounter (Signed)
Pt notified of labs, verbalized understanding

## 2015-03-14 NOTE — Telephone Encounter (Signed)
-----   Message from Baird Cancer, PA-C sent at 03/13/2015  5:51 PM EST ----- CBC noted.  Increasing WBC, declining HGB, and declining PLT.  Labs as planned in 1 month with appointment with Dr. Mamie Nick.  Let him know that his counts are changing and we will continue to follow them closely.

## 2015-03-17 LAB — MULTIPLE MYELOMA PANEL, SERUM
ALBUMIN/GLOB SERPL: 1.7 (ref 0.7–1.7)
ALPHA 1: 0.2 g/dL (ref 0.0–0.4)
Albumin SerPl Elph-Mcnc: 3.1 g/dL (ref 2.9–4.4)
Alpha2 Glob SerPl Elph-Mcnc: 0.8 g/dL (ref 0.4–1.0)
B-Globulin SerPl Elph-Mcnc: 0.8 g/dL (ref 0.7–1.3)
GAMMA GLOB SERPL ELPH-MCNC: 0.1 g/dL — AB (ref 0.4–1.8)
GLOBULIN, TOTAL: 1.9 g/dL — AB (ref 2.2–3.9)
IgG (Immunoglobin G), Serum: 50 mg/dL — ABNORMAL LOW (ref 700–1600)
IgM, Serum: <5 mg/dL — ABNORMAL LOW (ref 15–143)
Total Protein ELP: 5 g/dL — ABNORMAL LOW (ref 6.0–8.5)

## 2015-03-28 ENCOUNTER — Other Ambulatory Visit: Payer: Self-pay | Admitting: Cardiology

## 2015-03-28 ENCOUNTER — Other Ambulatory Visit (HOSPITAL_COMMUNITY): Payer: Self-pay | Admitting: Oncology

## 2015-04-18 ENCOUNTER — Encounter (HOSPITAL_COMMUNITY): Payer: Self-pay | Admitting: Hematology & Oncology

## 2015-04-18 ENCOUNTER — Encounter (HOSPITAL_COMMUNITY): Payer: Medicare Other

## 2015-04-18 ENCOUNTER — Ambulatory Visit (HOSPITAL_COMMUNITY): Payer: Medicare Other | Admitting: Oncology

## 2015-04-18 ENCOUNTER — Encounter (HOSPITAL_COMMUNITY): Payer: Medicare Other | Attending: Oncology | Admitting: Hematology & Oncology

## 2015-04-18 VITALS — BP 138/58 | HR 65 | Temp 97.3°F | Resp 16 | Wt 243.6 lb

## 2015-04-18 DIAGNOSIS — Z23 Encounter for immunization: Secondary | ICD-10-CM | POA: Insufficient documentation

## 2015-04-18 DIAGNOSIS — C9110 Chronic lymphocytic leukemia of B-cell type not having achieved remission: Secondary | ICD-10-CM | POA: Insufficient documentation

## 2015-04-18 DIAGNOSIS — Z8571 Personal history of Hodgkin lymphoma: Secondary | ICD-10-CM

## 2015-04-18 DIAGNOSIS — D801 Nonfamilial hypogammaglobulinemia: Secondary | ICD-10-CM | POA: Diagnosis not present

## 2015-04-18 DIAGNOSIS — C8190 Hodgkin lymphoma, unspecified, unspecified site: Secondary | ICD-10-CM | POA: Insufficient documentation

## 2015-04-18 DIAGNOSIS — Z95828 Presence of other vascular implants and grafts: Secondary | ICD-10-CM

## 2015-04-18 DIAGNOSIS — N183 Chronic kidney disease, stage 3 (moderate): Secondary | ICD-10-CM

## 2015-04-18 LAB — CBC WITH DIFFERENTIAL/PLATELET
BASOS ABS: 0 10*3/uL (ref 0.0–0.1)
BLASTS: 0 %
Band Neutrophils: 0 %
Basophils Relative: 0 %
EOS PCT: 1 %
Eosinophils Absolute: 0.5 10*3/uL (ref 0.0–0.7)
HEMATOCRIT: 34.7 % — AB (ref 39.0–52.0)
HEMOGLOBIN: 11.3 g/dL — AB (ref 13.0–17.0)
LYMPHS ABS: 45 10*3/uL — AB (ref 0.7–4.0)
LYMPHS PCT: 89 %
MCH: 33 pg (ref 26.0–34.0)
MCHC: 32.6 g/dL (ref 30.0–36.0)
MCV: 101.5 fL — AB (ref 78.0–100.0)
METAMYELOCYTES PCT: 0 %
MONO ABS: 1 10*3/uL (ref 0.1–1.0)
MYELOCYTES: 0 %
Monocytes Relative: 2 %
NEUTROS ABS: 4 10*3/uL (ref 1.7–7.7)
NRBC: 0 /100{WBCs}
Neutrophils Relative %: 8 %
Other: 0 %
PROMYELOCYTES ABS: 0 %
Platelets: 91 10*3/uL — ABNORMAL LOW (ref 150–400)
RBC: 3.42 MIL/uL — ABNORMAL LOW (ref 4.22–5.81)
RDW: 15.1 % (ref 11.5–15.5)
WBC: 50.5 10*3/uL (ref 4.0–10.5)

## 2015-04-18 LAB — COMPREHENSIVE METABOLIC PANEL
ALBUMIN: 4 g/dL (ref 3.5–5.0)
ALT: 13 U/L — AB (ref 17–63)
AST: 19 U/L (ref 15–41)
Alkaline Phosphatase: 93 U/L (ref 38–126)
Anion gap: 7 (ref 5–15)
BILIRUBIN TOTAL: 0.4 mg/dL (ref 0.3–1.2)
BUN: 18 mg/dL (ref 6–20)
CO2: 26 mmol/L (ref 22–32)
CREATININE: 1.94 mg/dL — AB (ref 0.61–1.24)
Calcium: 8.9 mg/dL (ref 8.9–10.3)
Chloride: 108 mmol/L (ref 101–111)
GFR calc Af Amer: 38 mL/min — ABNORMAL LOW (ref >60)
GFR, EST NON AFRICAN AMERICAN: 33 mL/min — AB (ref >60)
GLUCOSE: 106 mg/dL — AB (ref 65–99)
POTASSIUM: 4.7 mmol/L (ref 3.5–5.1)
Sodium: 141 mmol/L (ref 135–145)
TOTAL PROTEIN: 5.8 g/dL — AB (ref 6.5–8.1)

## 2015-04-18 LAB — RETICULOCYTES
RBC.: 3.42 MIL/uL — ABNORMAL LOW (ref 4.22–5.81)
RETIC COUNT ABSOLUTE: 47.9 10*3/uL (ref 19.0–186.0)
Retic Ct Pct: 1.4 % (ref 0.4–3.1)

## 2015-04-18 LAB — LACTATE DEHYDROGENASE: LDH: 168 U/L (ref 98–192)

## 2015-04-18 MED ORDER — SODIUM CHLORIDE 0.9 % IJ SOLN
10.0000 mL | Freq: Once | INTRAMUSCULAR | Status: AC
  Administered 2015-04-18: 10 mL via INTRAVENOUS

## 2015-04-18 MED ORDER — HEPARIN SOD (PORK) LOCK FLUSH 100 UNIT/ML IV SOLN
INTRAVENOUS | Status: AC
Start: 2015-04-18 — End: 2015-04-18
  Filled 2015-04-18: qty 5

## 2015-04-18 MED ORDER — HEPARIN SOD (PORK) LOCK FLUSH 100 UNIT/ML IV SOLN
500.0000 [IU] | Freq: Once | INTRAVENOUS | Status: AC
  Administered 2015-04-18: 500 [IU] via INTRAVENOUS

## 2015-04-18 NOTE — Progress Notes (Signed)
[name] presented for Portacath access and flush. Proper placement of portacath confirmed by CXR. Portacath located right chest wall accessed with  H 20 needle. Good blood return present. Portacath flushed with 20ml NS and 500U/5ml Heparin and needle removed intact. Procedure without incident. Patient tolerated procedure well.   

## 2015-04-18 NOTE — Patient Instructions (Signed)
[place] at [facility] Discharge Instructions  RECOMMENDATIONS MADE BY THE CONSULTANT AND ANY TEST RESULTS WILL BE SENT TO YOUR REFERRING PHYSICIAN.     Exam and discussion completed by Dr Whitney Muse today  Port flush today with lab work  Port flush every 8 weeks  You could have some neuropathy from the chemotherapy that you had.  This could be part of the numb feeling that you are having in your feet.  Return to see the doctor in 3 months with labs  Please call the clinic if you have any questions or concerns       Thank you for choosing [place] at [facility] to provide your oncology and hematology care.  To afford each patient quality time with our provider, please arrive at least 15 minutes before your scheduled appointment time.   Beginning January 23rd 2017 lab work for the [facility] will be done in the  Main lab at [facility] on 1st floor. If you have a lab appointment with the [facility] please come in thru the  Main Entrance and check in at the main information desk  You need to re-schedule your appointment should you arrive 10 or more minutes late.  We strive to give you quality time with our providers, and arriving late affects you and other patients whose appointments are after yours.  Also, if you no show three or more times for appointments you may be dismissed from the clinic at the providers discretion.     Again, thank you for choosing [facility].  Our hope is that these requests will decrease the amount of time that you wait before being seen by our physicians.       _____________________________________________________________  Should you have questions after your visit to [facility], please contact our office at [phone] between the hours of 8:30 a.m. and 4:30 p.m.  Voicemails left after 4:30 p.m. will not be returned until the following business day.  For prescription  refill requests, have your pharmacy contact our office.

## 2015-04-18 NOTE — Progress Notes (Signed)
CRITICAL VALUE ALERT Critical value received:  WBC 50.4 Date of notification:  04/18/2015 Time of notification: [id number] Critical value read back:  Yes.   Nurse who received alert:  Shellia Carwin RN MD notified (1st page):  Dr Whitney Muse

## 2015-04-18 NOTE — Progress Notes (Signed)
[name], MD [address] [place] [address]    [facility] NOTE   DIAGNOSIS: Hodgkin's disease   Staging form: Lymphoid Neoplasms, AJCC 6th Edition     Clinical: Stage III - Signed by Baird Cancer, PA on 09/14/2010 CLL CKD Stage IIIA Hypogammaglobulenemia  SUMMARY OF ONCOLOGIC HISTORY:   Hodgkin's disease ([place])   10/02/2007 Initial Diagnosis HODGKIN'S DISEASE    CURRENT THERAPY: Observation  INTERVAL HISTORY: TIN STANKUS 70 y.o. male returns for follow-up of CLL. He also has a history of Hodgkin Lymphoma. He has no major complaints today.  He denies night sweats, fever or chills. His appetite is fairly good. His energy is baseline and unchanged.   Mr. Kone returns to the [facility] alone today. He is having his blood drawn, and responds to questions slowly.  We brought him back to the [facility] a little sooner because some of his blood counts have gotten a little lower than usual.  He would like to share some pictures of his grandson today. He says he went out there to visit on the second of December and plans on going back as soon as possible, since he had a good time and enjoyed himself.  Mr. Folk says he's been feeling pretty good, just tired; but he feels that his energy levels align with his norm. He says he tries to "go," get done with "odds and ends" at the house, washing clothes, cleaning up, etc., and then afterward he just sits down and takes it easy. He confirms that he's eating good, and denies any big changes.  He does remark that he is having a lot of dreams lately, and says he "likes to hear from his wife." He implies that, in his dreams, he feels close to her. "It seems to me like she's sitting there with me." He says "I think that some of the things I feel, I really ain't got into doing anything else; I just try to work out all the stuff I need to at the house, and look after  myself or look after my son because he's staying there with me and doing the things I can't do outside and inside." He confirms that his son helps out a whole lot. He says that his son is not currently employed and is basically his caregiver. He again denies any major changes recently.  Mr. Witmer says that his feet have been numb and bothering him a little bit.  His PCP is Dr. Moshe Cipro and he says he sees her "probably once every six months or something." He confirms that she's looked at his feet before. He denies any problems with his feet today, saying "Naw, it's just the way they feel. They really don't bother me."  Mr. Mikulak comments on his bad teeth and the fact that he needs to get some more of them out. He denies B symptoms. Weight is stable to increased.   MEDICAL HISTORY: Past Medical History  Diagnosis Date  . Aortic stenosis   . Atrial fibrillation ([place])   . Coronary atherosclerosis of native coronary artery   . Hyperlipidemia   . Essential hypertension, benign   . BPH (benign prostatic hypertrophy)   . Arthritis   . Acid reflux   . Hypogammaglobulinemia ([place])   . Pre-diabetes 08/04/2011  . CLL (chronic lymphoblastic leukemia)   . Hodgkin's disease   . Kidney stones   .  Wears glasses   . Wears dentures   . HOH (hard of hearing)   . Indirect inguinal hernia 04/13/2013    has Hodgkin's disease ([place]); Leukemia, lymphocytic, chronic ([place]); Mixed hyperlipidemia; Obesity; CORONARY ATHEROSCLEROSIS NATIVE CORONARY ARTERY; Aortic valve disorder; Atrial fibrillation ([place]); AORTIC VALVE REPLACEMENT, HX OF; Essential hypertension, benign; GERD (gastroesophageal reflux disease); Thrombocytopenia ([place]); Hypogammaglobulinemia, acquired ([place]); Anemia; Pre-diabetes; Dental caries; Piles (hemorrhoids); Reduced vision; Neck mass; Cholelithiasis; Indirect inguinal hernia; Chronic kidney disease (CKD); Annual physical exam; and Pigmented skin lesion on his problem list.     has No Known  Allergies.  We administered heparin lock flush and sodium chloride.  SURGICAL HISTORY: Past Surgical History  Procedure Laterality Date  . Aortic valve replacement  10/09    21mm Magna Pericardial   . Esophagogastroduodenoscopy      scrapping of throat and stretching  . Lithotripsy    . Bone marrow aspiration    . Portacath placement    . 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 Colon surgery    . Dental surgery  08/2012  . Multiple tooth extractions    . Mass excision N/A 12/05/2012    Procedure: EXCISION MIDLINE NECK MASS;  Surgeon: Ascencion Dike, MD;  Location: [facility];  Service: ENT;  Laterality: N/A;  . Tongue biopsy  12/2012  . Cataract extraction w/phaco Right 01/07/2014    Procedure: CATARACT EXTRACTION PHACO AND INTRAOCULAR LENS PLACEMENT (IOC);  Surgeon: Tonny Branch, MD;  Location: [facility];  Service: Ophthalmology;  Laterality: Right;  CDE:15.31  . Eye surgery Right 02/07/2014    cataract  . Cataract extraction w/phaco Left 03/25/2014    Procedure: CATARACT EXTRACTION PHACO AND INTRAOCULAR LENS PLACEMENT (IOC);  Surgeon: Tonny Branch, MD;  Location: [facility];  Service: Ophthalmology;  Laterality: Left;  CDE 6.55    SOCIAL HISTORY: Social History   Social History  . Marital Status: Widowed    Spouse Name: N/A  . Number of Children: N/A  . Years of Education: N/A   Occupational History  . Not on file.   Social History Main Topics  . Smoking status: Never Smoker   . Smokeless tobacco: Never Used     Comment: pt denies tobacco use   . Alcohol Use: No     Comment: pt denies alcohol   . Drug Use: No  . Sexual Activity: Not Currently   Other Topics Concern  . Not on file   Social History Narrative    FAMILY HISTORY: Family History  Problem Relation Age of Onset  . Heart failure Mother   . Blindness Mother   . Cancer Father 55    Lung   . Cancer Brother 48    acute leukemia     Review of Systems  Constitutional: Negative.    HENT: Negative.   Eyes: Negative.   Respiratory: Negative.   Cardiovascular: Negative.   Gastrointestinal: Negative.   Genitourinary:       Nocturia  Skin: Negative.   Neurological: Negative.   Endo/Heme/Allergies: Negative.   Psychiatric/Behavioral: Negative.   14 point review of systems was performed and is negative except as detailed under history of present illness and above  PHYSICAL EXAMINATION  ECOG PERFORMANCE STATUS: 0 - Asymptomatic  Filed Vitals:   04/18/15 1324  BP: 138/58  Pulse: 65  Temp: 97.3 F (36.3 C)  Resp: 16    Physical Exam  Constitutional: He is oriented to person, place, and time  and well-developed, well-nourished, and in no distress.  HENT:  Head: Normocephalic and atraumatic.  Nose: Nose normal.  Mouth/Throat: Oropharynx is clear and moist. No oropharyngeal exudate.  Eyes: Conjunctivae and EOM are normal. Pupils are equal, round, and reactive to light. Right eye exhibits no discharge. Left eye exhibits no discharge. No scleral icterus.  Neck: Normal range of motion. Neck supple. No tracheal deviation present. No thyromegaly present.  Cardiovascular: Normal rate, regular rhythm and normal heart sounds.  Exam reveals no gallop and no friction rub.   No murmur heard. Pulmonary/Chest:  Rhonchi and wheezing in both lungs Clear with cough Abdominal: Soft. Bowel sounds are normal. He exhibits no distension and no mass. There is no tenderness. There is no rebound and no guarding.  Musculoskeletal: Normal range of motion. He exhibits no edema.  Lymphadenopathy:    He has no cervical adenopathy.  Neurological: He is alert and oriented to person, place, and time. He has normal reflexes. No cranial nerve deficit. Gait normal. Coordination normal.  Skin: Skin is warm and dry. No rash noted.  Psychiatric: Mood, memory, affect and judgment normal.  Nursing note and vitals reviewed.   LABORATORY DATA: I have reviewed the data as listed.  CBC     Component Value Date/Time   WBC 50.5* 04/18/2015 1347   RBC 3.42* 04/18/2015 1347   RBC 3.42* 04/18/2015 1347   HGB 11.3* 04/18/2015 1347   HCT 34.7* 04/18/2015 1347   PLT 91* 04/18/2015 1347   MCV 101.5* 04/18/2015 1347   MCH 33.0 04/18/2015 1347   MCHC 32.6 04/18/2015 1347   RDW 15.1 04/18/2015 1347   LYMPHSABS PENDING 04/18/2015 1347   MONOABS PENDING 04/18/2015 1347   EOSABS 1.0* 04/18/2015 1347   BASOSABS 0.1 04/18/2015 1347   CMP     Component Value Date/Time   NA 141 04/18/2015 1347   K 4.7 04/18/2015 1347   CL 108 04/18/2015 1347   CO2 26 04/18/2015 1347   GLUCOSE 106* 04/18/2015 1347   BUN 18 04/18/2015 1347   CREATININE 1.94* 04/18/2015 1347   CREATININE 1.53* 10/10/2014 0923   CALCIUM 8.9 04/18/2015 1347   PROT 5.8* 04/18/2015 1347   ALBUMIN 4.0 04/18/2015 1347   AST 19 04/18/2015 1347   ALT 13* 04/18/2015 1347   ALKPHOS 93 04/18/2015 1347   BILITOT 0.4 04/18/2015 1347   GFRNONAA 33* 04/18/2015 1347   GFRAA 38* 04/18/2015 1347   Results for ORVIL, M[place]AG[place] (MRN [id number]) as of 04/30/2015 18:37  Ref. Range 05/03/2014 12:11 10/18/2014 10:45 01/16/2015 14:15 03/13/2015 13:07 04/18/2015 13:47  Platelets Latest Ref Range: 150-400 K/uL 144 (L) 119 (L) 104 (L) 94 (L) 91 (L)   ASSESSMENT and THERAPY PLAN:  DIAGNOSIS: Hodgkin's disease   Staging form: Lymphoid Neoplasms, AJCC 6th Edition     Clinical: Stage III - Signed by Baird Cancer, PA on 09/14/2010 CLL CKD Stage IIIA Hypogammaglobulenemia  He has wheezing on today's pulmonary exam but he states he feels this is allergies as this intermittently occurs. I have encouraged him to notify us or Dr. Moshe Cipro if his symptoms do not improve.   His platelet count is beginning to fall. This is going to have to be closely monitored. If it worsens I would recommend BMBX and if it is secondary to his CLL, proceeding with appropriate therapy.  I anticipate repeating a CBC in 6 weeks.   Schedule him tentatively for 3  months for a repeat PE, with CBC and CMP.  Orders Placed This  Encounter  Procedures  . CBC with Differential    Standing Status: Future     Number of Occurrences: 1     Standing Expiration Date: 04/17/2016  . Comprehensive metabolic panel    Standing Status: Future     Number of Occurrences: 1     Standing Expiration Date: 04/17/2016  . Lactate dehydrogenase    Standing Status: Future     Number of Occurrences: 1     Standing Expiration Date: 04/17/2016  . Reticulocytes    Standing Status: Future     Number of Occurrences: 1     Standing Expiration Date: 04/17/2016  . Haptoglobin    Standing Status: Future     Number of Occurrences: 1     Standing Expiration Date: 04/17/2016  . CBC with Differential    Standing Status: Future     Number of Occurrences:      Standing Expiration Date: 04/17/2016  . Comprehensive metabolic panel    Standing Status: Future     Number of Occurrences:      Standing Expiration Date: 04/17/2016   This document serves as a record of services personally performed by Ancil Linsey, MD. It was created on her behalf by Toni Amend, a trained medical scribe. The creation of this record is based on the scribe's personal observations and the provider's statements to them. This document has been checked and approved by the attending provider.  I have reviewed the above documentation for accuracy and completeness, and I agree with the above.  This note was electronically signed.    Kelby Fam. Whitney Muse, MD

## 2015-04-19 LAB — HAPTOGLOBIN: Haptoglobin: 105 mg/dL (ref 34–200)

## 2015-05-02 ENCOUNTER — Ambulatory Visit (INDEPENDENT_AMBULATORY_CARE_PROVIDER_SITE_OTHER): Payer: Medicare Other

## 2015-05-02 DIAGNOSIS — Z23 Encounter for immunization: Secondary | ICD-10-CM | POA: Diagnosis not present

## 2015-05-13 DIAGNOSIS — H35363 Drusen (degenerative) of macula, bilateral: Secondary | ICD-10-CM | POA: Diagnosis not present

## 2015-05-13 DIAGNOSIS — Z961 Presence of intraocular lens: Secondary | ICD-10-CM | POA: Diagnosis not present

## 2015-05-13 DIAGNOSIS — Z9849 Cataract extraction status, unspecified eye: Secondary | ICD-10-CM | POA: Diagnosis not present

## 2015-06-10 ENCOUNTER — Ambulatory Visit (INDEPENDENT_AMBULATORY_CARE_PROVIDER_SITE_OTHER): Payer: Medicare Other | Admitting: Family Medicine

## 2015-06-10 ENCOUNTER — Encounter: Payer: Self-pay | Admitting: Family Medicine

## 2015-06-10 ENCOUNTER — Other Ambulatory Visit: Payer: Self-pay | Admitting: Family Medicine

## 2015-06-10 ENCOUNTER — Ambulatory Visit (HOSPITAL_COMMUNITY)
Admission: RE | Admit: 2015-06-10 | Discharge: 2015-06-10 | Disposition: A | Discharge status: Home / Self Care | Payer: Medicare Other | Source: Ambulatory Visit | Attending: Family Medicine | Admitting: Family Medicine

## 2015-06-10 VITALS — BP 124/62 | HR 73 | Temp 99.8°F | Resp 16 | Ht 70.0 in | Wt 237.1 lb

## 2015-06-10 DIAGNOSIS — R05 Cough: Secondary | ICD-10-CM | POA: Diagnosis not present

## 2015-06-10 DIAGNOSIS — J209 Acute bronchitis, unspecified: Secondary | ICD-10-CM | POA: Insufficient documentation

## 2015-06-10 DIAGNOSIS — D630 Anemia in neoplastic disease: Secondary | ICD-10-CM

## 2015-06-10 DIAGNOSIS — Z125 Encounter for screening for malignant neoplasm of prostate: Secondary | ICD-10-CM | POA: Diagnosis not present

## 2015-06-10 DIAGNOSIS — E559 Vitamin D deficiency, unspecified: Secondary | ICD-10-CM

## 2015-06-10 DIAGNOSIS — Z0000 Encounter for general adult medical examination without abnormal findings: Secondary | ICD-10-CM | POA: Diagnosis not present

## 2015-06-10 DIAGNOSIS — J111 Influenza due to unidentified influenza virus with other respiratory manifestations: Secondary | ICD-10-CM

## 2015-06-10 DIAGNOSIS — E782 Mixed hyperlipidemia: Secondary | ICD-10-CM

## 2015-06-10 DIAGNOSIS — R7303 Prediabetes: Secondary | ICD-10-CM

## 2015-06-10 DIAGNOSIS — R7302 Impaired glucose tolerance (oral): Secondary | ICD-10-CM | POA: Diagnosis not present

## 2015-06-10 DIAGNOSIS — I10 Essential (primary) hypertension: Secondary | ICD-10-CM

## 2015-06-10 DIAGNOSIS — R0989 Other specified symptoms and signs involving the circulatory and respiratory systems: Secondary | ICD-10-CM

## 2015-06-10 DIAGNOSIS — R296 Repeated falls: Secondary | ICD-10-CM

## 2015-06-10 DIAGNOSIS — Z1159 Encounter for screening for other viral diseases: Secondary | ICD-10-CM | POA: Diagnosis not present

## 2015-06-10 DIAGNOSIS — R509 Fever, unspecified: Secondary | ICD-10-CM | POA: Diagnosis not present

## 2015-06-10 MED ORDER — AZITHROMYCIN 250 MG PO TABS: ORAL_TABLET | ORAL | Status: DC

## 2015-06-10 MED ORDER — CEFTRIAXONE SODIUM 500 MG IJ SOLR
500.0000 mg | Freq: Once | INTRAMUSCULAR | Status: AC
  Administered 2015-06-10: 500 mg via INTRAMUSCULAR

## 2015-06-10 MED ORDER — OSELTAMIVIR PHOSPHATE 75 MG PO CAPS: 75.0000 mg | ORAL_CAPSULE | Freq: Two times a day (BID) | ORAL | Status: DC

## 2015-06-10 NOTE — Patient Instructions (Addendum)
Annual physical exam in September, call if you need me before  You are referred for [place] of neck arteries  Care not to fall, and you will get a script for a walking cane  Labs today and also CXR  Medication sent for influenza symptoms and sinusitis and bronchitsi, tamiflu and z pack Rocephin in office  Hope you feel better soon, as you will soon be travelling o see [name]!  Thanks for choosing [facility], we consider it a privelige to serve you.

## 2015-06-10 NOTE — Progress Notes (Signed)
Subjective:    Patient ID: [name], male    DOB: [birth year], 70 y.o.   MRN: [id number]  HPI Preventive Screening-Counseling & Management   Patient present here today for a Medicare annual wellness visit.. 3 day h/o chills, body aches, nasal drainage and cough.   Current Problems (verified)   Medications Prior to Visit Allergies (verified)   PAST HISTORY  Family History (VERIFIED)  Social History  Widowed x 2 years, 2 sons, 1 grandson, disabled since 2004    Risk Factors  Current exercise habits: tries to walk around some but has to take it slow    Dietary issues discussed: heart healthy diet discussed, eating more vegetables and fruit and white meat and cut back on soda/tea   Cardiac risk factors: Mother had heart disease   Depression Screen  (Note: if answer to either of the following is "Yes", a more complete depression screening is indicated)   Over the past two weeks, have you felt down, depressed or hopeless? No  Over the past two weeks, have you felt little interest or pleasure in doing things? No  Have you lost interest or pleasure in daily life? No  Do you often feel hopeless? No  Do you cry easily over simple problems? No   Activities of Daily Living  In your present state of health, do you have any difficulty performing the following activities?  Driving?: No Managing money?: No Feeding yourself?:No Getting from bed to chair?:No Climbing a flight of stairs?:yes,  has to take his time Preparing food and eating?:No Bathing or showering?:No Getting dressed?:No Getting to the toilet?:No Using the toilet?:No Moving around from place to place?: yes at times  Fall Risk Assessment In the past year have you fallen or had a near fall?: twice in the past 6 month Are you currently taking any medications that make you dizzy?:No   Hearing Difficulties: No Do you often ask people to speak up or repeat themselves?:No Do you experience ringing or noises  in your ears?:No Do you have difficulty understanding soft or whispered voices?:No  Cognitive Testing  Alert? Yes Normal Appearance?Yes  Oriented to person? Yes Place? Yes  Time? Yes  Displays appropriate judgment?Yes  Can read the correct time from a watch face? yes Are you having problems remembering things?No  Advanced Directives have been discussed with the patient?Yes, will give brochure , full code  List the Names of Other Physician/Practitioners you currently use:  Dr Domenic Polite (cardio) Dr Whitney Muse (oncology)  Indicate any recent Medical Services you may have received from other than [facility] providers in the past year (date may be approximate).   Assessment:    Annual Wellness Exam   Plan:    Medicare Attestation  I have personally reviewed:  The patient's medical and social history  Their use of alcohol, tobacco or illicit drugs  Their current medications and supplements  The patient's functional ability including ADLs,fall risks, home safety risks, cognitive, and hearing and visual impairment  Diet and physical activities  Evidence for depression or mood disorders  The patient's weight, height, BMI, and visual acuity have been recorded in the chart. I have made referrals, counseling, and provided education to the patient based on review of the above and I have provided the patient with a written personalized care plan for preventive services.      Review of Systems     Objective:   Physical Exam  BP 124/62 mmHg  Pulse 73  Temp(Src) 99.8 F (37.7  C) (Oral)  Resp 16  Ht 5\' 10"  (1.778 m)  Wt 237 lb 1.9 oz (107.557 kg)  BMI 34.02 kg/m2  SpO2 97%  HEENT; erythema and edema of nasal mucosa, TM clear, oropharynx pink and moist, no exudate, neck supple no adenopathy, bilateral conjunctival injection, maxillary sinus tenderness No goiter, bilateral bruits  Chest : decreased air entry, bilateral crackles no wheezes      Assessment & Plan:  Medicare annual  wellness visit, subsequent Annual exam as documented. Counseling done  re healthy lifestyle involving commitment to 150 minutes exercise per week, heart healthy diet, and attaining healthy weight.The importance of adequate sleep also discussed. Regular seat belt use and home safety, is also discussed. Changes in health habits are decided on by the patient with goals and time frames  set for achieving them. Immunization and cancer screening needs are specifically addressed at this visit.   Acute bronchitis cXR and labs today Rocephin iM in office followed by z pack  Influenza with respiratory manifestation tamiflu prescribed and pt also treated for acute sinusitis and bronchitis  Recurrent falls while walking Carotid doppler ordered to r/o significant stenosis Encouraged pt strongly to do PT for 4 to 6 weeks to help with stability, opting out at this time Script provided for a cane Home safety reviewed in detail

## 2015-06-10 NOTE — Assessment & Plan Note (Signed)

## 2015-06-11 LAB — CBC WITH DIFFERENTIAL/PLATELET
BASOS ABS: 0 10*3/uL (ref 0.0–0.1)
BASOS PCT: 0 % (ref 0–1)
EOS ABS: 1 10*3/uL — AB (ref 0.0–0.7)
EOS PCT: 2 % (ref 0–5)
HCT: 36.2 % — ABNORMAL LOW (ref 39.0–52.0)
Hemoglobin: 12 g/dL — ABNORMAL LOW (ref 13.0–17.0)
Lymphocytes Relative: 85 % — ABNORMAL HIGH (ref 12–46)
Lymphs Abs: 40.7 10*3/uL — ABNORMAL HIGH (ref 0.7–4.0)
MCH: 32 pg (ref 26.0–34.0)
MCHC: 33.1 g/dL (ref 30.0–36.0)
MCV: 96.5 fL (ref 78.0–100.0)
MPV: 10.6 fL (ref 8.6–12.4)
Monocytes Absolute: 1.4 10*3/uL — ABNORMAL HIGH (ref 0.1–1.0)
Monocytes Relative: 3 % (ref 3–12)
Neutro Abs: 4.8 10*3/uL (ref 1.7–7.7)
Neutrophils Relative %: 10 % — ABNORMAL LOW (ref 43–77)
PLATELETS: 80 10*3/uL — AB (ref 150–400)
RBC: 3.75 MIL/uL — AB (ref 4.22–5.81)
RDW: 14.6 % (ref 11.5–15.5)
WBC: 47.9 10*3/uL — AB (ref 4.0–10.5)

## 2015-06-11 LAB — LIPID PANEL
CHOLESTEROL: 200 mg/dL (ref 125–200)
HDL: 16 mg/dL — AB (ref >=40)
LDL Cholesterol: 146 mg/dL — ABNORMAL HIGH (ref <130)
TRIGLYCERIDES: 190 mg/dL — AB (ref <150)
Total CHOL/HDL Ratio: 12.5 Ratio — ABNORMAL HIGH (ref <=5.0)
VLDL: 38 mg/dL — ABNORMAL HIGH (ref <30)

## 2015-06-11 LAB — COMPREHENSIVE METABOLIC PANEL
ALBUMIN: 4 g/dL (ref 3.6–5.1)
ALT: 17 U/L (ref 9–46)
AST: 21 U/L (ref 10–35)
Alkaline Phosphatase: 117 U/L — ABNORMAL HIGH (ref 40–115)
BUN: 16 mg/dL (ref 7–25)
CHLORIDE: 104 mmol/L (ref 98–110)
CO2: 25 mmol/L (ref 20–31)
CREATININE: 1.59 mg/dL — AB (ref 0.70–1.18)
Calcium: 8.6 mg/dL (ref 8.6–10.3)
Glucose, Bld: 116 mg/dL — ABNORMAL HIGH (ref 65–99)
POTASSIUM: 4.3 mmol/L (ref 3.5–5.3)
SODIUM: 137 mmol/L (ref 135–146)
TOTAL PROTEIN: 5.6 g/dL — AB (ref 6.1–8.1)
Total Bilirubin: 0.5 mg/dL (ref 0.2–1.2)

## 2015-06-11 LAB — PSA, MEDICARE: PSA: 0.02 ng/mL (ref <=4.00)

## 2015-06-11 LAB — HEPATITIS C ANTIBODY: HCV Ab: NEGATIVE

## 2015-06-11 LAB — PATHOLOGIST SMEAR REVIEW

## 2015-06-11 LAB — VITAMIN D 25 HYDROXY (VIT D DEFICIENCY, FRACTURES): Vit D, 25-Hydroxy: 23 ng/mL — ABNORMAL LOW (ref 30–100)

## 2015-06-11 LAB — TSH: TSH: 2.38 m[IU]/L (ref 0.40–4.50)

## 2015-06-12 ENCOUNTER — Ambulatory Visit (HOSPITAL_COMMUNITY)
Admission: RE | Admit: 2015-06-12 | Discharge: 2015-06-12 | Disposition: A | Discharge status: Home / Self Care | Payer: Medicare Other | Source: Ambulatory Visit | Attending: Family Medicine | Admitting: Family Medicine

## 2015-06-12 DIAGNOSIS — R0989 Other specified symptoms and signs involving the circulatory and respiratory systems: Secondary | ICD-10-CM | POA: Diagnosis not present

## 2015-06-12 DIAGNOSIS — I6523 Occlusion and stenosis of bilateral carotid arteries: Secondary | ICD-10-CM | POA: Diagnosis not present

## 2015-06-12 LAB — HEMOGLOBIN A1C
Hgb A1c MFr Bld: 5.7 % — ABNORMAL HIGH (ref <5.7)
Mean Plasma Glucose: 117 mg/dL — ABNORMAL HIGH (ref <117)

## 2015-06-13 ENCOUNTER — Encounter (HOSPITAL_COMMUNITY): Payer: Medicare Other

## 2015-06-13 ENCOUNTER — Telehealth: Payer: Self-pay | Admitting: Family Medicine

## 2015-06-13 MED ORDER — PENICILLIN V POTASSIUM 500 MG PO TABS: 500.0000 mg | ORAL_TABLET | Freq: Three times a day (TID) | ORAL | Status: DC

## 2015-06-13 NOTE — Telephone Encounter (Signed)
Thinks its making his SOB, started after he took the first dose. Doesn't want to take anymore and wants it changed to something else

## 2015-06-13 NOTE — Telephone Encounter (Signed)
Patient aware.

## 2015-06-13 NOTE — Telephone Encounter (Signed)
Penicillin sent  pls let him know , pls enter his adverse raction under allergies

## 2015-06-13 NOTE — Telephone Encounter (Signed)
[name] states that he cant take the Zpak, he states that it made him feel worse, please advise?

## 2015-06-13 NOTE — Telephone Encounter (Signed)
Pt aware and med sent  

## 2015-06-14 ENCOUNTER — Encounter: Payer: Self-pay | Admitting: Family Medicine

## 2015-06-14 DIAGNOSIS — R296 Repeated falls: Secondary | ICD-10-CM | POA: Insufficient documentation

## 2015-06-14 NOTE — Assessment & Plan Note (Signed)
cXR and labs today Rocephin iM in office followed by z pack

## 2015-06-14 NOTE — Assessment & Plan Note (Signed)
Carotid doppler ordered to r/o significant stenosis Encouraged pt strongly to do PT for 4 to 6 weeks to help with stability, opting out at this time Script provided for a cane Home safety reviewed in detail

## 2015-06-14 NOTE — Assessment & Plan Note (Signed)
tamiflu prescribed and pt also treated for acute sinusitis and bronchitis

## 2015-06-16 ENCOUNTER — Encounter (HOSPITAL_COMMUNITY): Payer: Medicare Other | Attending: Oncology

## 2015-06-16 VITALS — BP 141/61 | HR 77 | Temp 98.3°F | Resp 18

## 2015-06-16 DIAGNOSIS — C8190 Hodgkin lymphoma, unspecified, unspecified site: Secondary | ICD-10-CM | POA: Insufficient documentation

## 2015-06-16 DIAGNOSIS — Z452 Encounter for adjustment and management of vascular access device: Secondary | ICD-10-CM | POA: Diagnosis not present

## 2015-06-16 DIAGNOSIS — C9110 Chronic lymphocytic leukemia of B-cell type not having achieved remission: Secondary | ICD-10-CM | POA: Diagnosis not present

## 2015-06-16 DIAGNOSIS — Z95828 Presence of other vascular implants and grafts: Secondary | ICD-10-CM

## 2015-06-16 DIAGNOSIS — Z23 Encounter for immunization: Secondary | ICD-10-CM | POA: Insufficient documentation

## 2015-06-16 MED ORDER — SODIUM CHLORIDE 0.9% FLUSH
10.0000 mL | Freq: Once | INTRAVENOUS | Status: AC
  Administered 2015-06-16: 10 mL via INTRAVENOUS

## 2015-06-16 MED ORDER — HEPARIN SOD (PORK) LOCK FLUSH 100 UNIT/ML IV SOLN
500.0000 [IU] | Freq: Once | INTRAVENOUS | Status: AC
  Administered 2015-06-16: 500 [IU] via INTRAVENOUS
  Filled 2015-06-16: qty 5

## 2015-06-16 NOTE — Patient Instructions (Signed)
[place] at [facility] Discharge Instructions  RECOMMENDATIONS MADE BY THE CONSULTANT AND ANY TEST RESULTS WILL BE SENT TO YOUR REFERRING PHYSICIAN.  Port flush today as scheduled.  Thank you for choosing [place] at [facility] to provide your oncology and hematology care.  To afford each patient quality time with our provider, please arrive at least 15 minutes before your scheduled appointment time.   Beginning January 23rd 2017 lab work for the [facility] will be done in the  Main lab at [facility] on 1st floor. If you have a lab appointment with the [facility] please come in thru the  Main Entrance and check in at the main information desk  You need to re-schedule your appointment should you arrive 10 or more minutes late.  We strive to give you quality time with our providers, and arriving late affects you and other patients whose appointments are after yours.  Also, if you no show three or more times for appointments you may be dismissed from the clinic at the providers discretion.     Again, thank you for choosing [facility].  Our hope is that these requests will decrease the amount of time that you wait before being seen by our physicians.       _____________________________________________________________  Should you have questions after your visit to [facility], please contact our office at [phone] between the hours of 8:30 a.m. and 4:30 p.m.  Voicemails left after 4:30 p.m. will not be returned until the following business day.  For prescription refill requests, have your pharmacy contact our office.         Resources For Cancer Patients and their Caregivers ? American Cancer Society: Can assist with transportation, wigs, general needs, runs Look Good Feel Better.        630-472-7221 ? Cancer Care: Provides financial assistance, online support groups, medication/co-pay assistance.   1-800-813-HOPE (912) 438-7930) ? [place] Assists [place] cancer patients and their families through emotional , educational and financial support.  2158290696 ? [place] DSS Where to apply for food stamps, Medicaid and utility assistance. 5631516483 ? [employer]: Transportation to medical appointments. 8062757538 ? Social Security Administration: May apply for disability if have a Stage IV cancer. 386-767-5816 5166165945 ? [employer], Disability and Transit Services: Assists with nutrition, care and transit needs. (215) 785-0864

## 2015-06-16 NOTE — Progress Notes (Signed)
[name] presented for Portacath access and flush. Proper placement of portacath confirmed by CXR. Portacath located right chest wall accessed with  H 20 needle. Good blood return present. Portacath flushed with 20ml NS and 500U/5ml Heparin and needle removed intact. Procedure without incident. Patient tolerated procedure well.   

## 2015-06-27 DIAGNOSIS — R2681 Unsteadiness on feet: Secondary | ICD-10-CM | POA: Diagnosis not present

## 2015-06-30 ENCOUNTER — Telehealth: Payer: Self-pay

## 2015-06-30 ENCOUNTER — Ambulatory Visit (HOSPITAL_COMMUNITY)
Admission: RE | Admit: 2015-06-30 | Discharge: 2015-06-30 | Disposition: A | Discharge status: Home / Self Care | Payer: Medicare Other | Source: Ambulatory Visit | Attending: Family Medicine | Admitting: Family Medicine

## 2015-06-30 DIAGNOSIS — M25552 Pain in left hip: Secondary | ICD-10-CM | POA: Insufficient documentation

## 2015-06-30 NOTE — Telephone Encounter (Signed)
Patient aware and will have imaging done today if he can

## 2015-06-30 NOTE — Telephone Encounter (Signed)
He received antibiotic s only, had called back sating Z pack bothered him He already had a problem with unsteady gait and falls, and I recommended Pt Explain that I do believe this issue to arthritis, not the meds, BUT I would like him to get xray of the hip and have ordered this. Use tylenol 500 mg one twice daily as needed for pain, and reconsider PT

## 2015-07-04 ENCOUNTER — Telehealth: Payer: Self-pay | Admitting: Family Medicine

## 2015-07-04 NOTE — Telephone Encounter (Signed)
Patient made aware of results.  

## 2015-07-04 NOTE — Telephone Encounter (Signed)
Patient is calling asking for results from hip x-rays

## 2015-07-16 NOTE — Assessment & Plan Note (Signed)
Progressive thrombocytopenia, likely secondary to CLL.

## 2015-07-16 NOTE — Assessment & Plan Note (Signed)
Multifactorial anemia, normocytic and normochromic, thought to be secondary to past treatment for Hodgkin's Lymphoma, CLL, and Stage III chronic renal disease.  STABLE 

## 2015-07-16 NOTE — Progress Notes (Signed)
[name] Nakayama, MD [address] [place] [address]  Leukemia, lymphocytic, chronic [facility]) - Plan: CT Biopsy, CBC with Differential  Hodgkin lymphoma, unspecified Hodgkin lymphoma type, unspecified body region ([place])  Hypogammaglobulinemia, acquired ([place])  Anemia in neoplastic disease  Thrombocytopenia ([place])  Left-sided low back pain without sciatica - Plan: DG Lumbar Spine Complete, DG HIP UNILAT W OR W/O PELVIS 2-3 VIEWS LEFT  CURRENT THERAPY:  INTERVAL HISTORY: [name] 70 y.o. male returns for followup of CLL AND History of Hodgkin's Lymphoma AND Hypogammaglobulenemia AND CKD, Stage IIIA    Hodgkin's disease ([place])   10/02/2007 Initial Diagnosis HODGKIN'S DISEASE    I personally reviewed and went over laboratory results with the patient.  The results are noted within this dictation.  We will update labs today.  Of note, his platelet count has been declining, suspicious for progression of CLL.  His white blood cell count today is above baseline at 110.  His weight is relatively stable over the past year ranging between 230-244 pounds.  He denies any worsening of B symptoms. Appetite is strong.  Based upon his platelet count and increasing white count, and conjunction with declining hemoglobin, I think it is wise that we pursue bone marrow aspiration biopsy. I reviewed this in detail with the patient. He's had one in the distant past and is familiar with this. I recommended utilizing interventional radiology as Sixto remembers a not so pleasant experience with his past bone marrow aspiration and biopsy here in the clinic with Dr. Tressie Stalker.  [name] continues to redirect our conversation to low back pain and left hip pain. He has seen his primary care physician, Dr. Moshe Cipro, about this recently. He underwent imaging studies of left hip/pelvis and those images weren't negative for any acute findings. He admits that the discomfort is improved  but he was expected to be resolved by now. He denies any point tenderness but shows me that the pain is lateral to the spine and low back with left pain being more than right pain. He notes that it is exacerbated by particular movements and alleviated with Tylenol and rest. He denies any acute trauma or falls. We discussed options. See below for details.   Past Medical History  Diagnosis Date  . Aortic stenosis   . Atrial fibrillation ([place])   . Coronary atherosclerosis of native coronary artery   . Hyperlipidemia   . Essential hypertension, benign   . BPH (benign prostatic hypertrophy)   . Arthritis   . Acid reflux   . Hypogammaglobulinemia ([place])   . Pre-diabetes 08/04/2011  . CLL (chronic lymphoblastic leukemia)   . Hodgkin's disease   . Kidney stones   . Wears glasses   . Wears dentures   . HOH (hard of hearing)   . Indirect inguinal hernia 04/13/2013    has Hodgkin's disease ([place]); Leukemia, lymphocytic, chronic ([place]); Mixed hyperlipidemia; Obesity; CORONARY ATHEROSCLEROSIS NATIVE CORONARY ARTERY; Aortic valve disorder; Atrial fibrillation ([place]); AORTIC VALVE REPLACEMENT, HX OF; Essential hypertension, benign; GERD (gastroesophageal reflux disease); Thrombocytopenia ([place]); Hypogammaglobulinemia, acquired ([place]); Anemia; Pre-diabetes; Dental caries; Piles (hemorrhoids); Reduced vision; Neck mass; Cholelithiasis; Indirect inguinal hernia; Chronic kidney disease (CKD); Annual physical exam; Pigmented skin lesion; Medicare annual wellness visit, subsequent; Carotid bruit; Acute bronchitis; Influenza with respiratory manifestation; and Recurrent falls while walking on his problem list.     is allergic to azithromycin.  Current Outpatient Prescriptions on File Prior to Visit  Medication Sig Dispense Refill  .  acetaminophen (TYLENOL) 500 MG tablet Take 1,000 mg by mouth every 6 (six) hours as needed for fever.     Marland Kitchen acyclovir (ZOVIRAX) 200 MG capsule TAKE ONE CAPSULE BY MOUTH TWICE DAILY 60  capsule 3  . aspirin EC 81 MG tablet Take 81 mg by mouth daily.    Marland Kitchen azithromycin (ZITHROMAX) 250 MG tablet Two tablets on day one, then one tablet once daily for an additional 4 days (Patient not taking: Reported on 07/17/2015) 6 tablet 0  . folic acid (FOLVITE) 1 MG tablet TAKE ONE TABLET BY MOUTH ONCE DAILY 30 tablet 5  . metoprolol (LOPRESSOR) 50 MG tablet TAKE ONE TABLET BY MOUTH TWICE DAILY 60 tablet 6  . Multiple Vitamins-Minerals (CENTRUM SILVER ULTRA MENS PO) Take 1 tablet by mouth every morning.     Marland Kitchen OMEGA 3 1000 MG CAPS Take 1,000 mg by mouth daily.     Marland Kitchen omeprazole (PRILOSEC) 20 MG capsule Take 20 mg by mouth every morning.     Marland Kitchen oseltamivir (TAMIFLU) 75 MG capsule Take 1 capsule (75 mg total) by mouth 2 (two) times daily. 10 capsule 0  . penicillin v potassium (VEETID) 500 MG tablet Take 1 tablet (500 mg total) by mouth 3 (three) times daily. 30 tablet 0  . simvastatin (ZOCOR) 40 MG tablet Take 1 tablet (40 mg total) by mouth at bedtime. 90 tablet 3  . sulfamethoxazole-trimethoprim (BACTRIM DS,SEPTRA DS) 800-160 MG tablet      No current facility-administered medications on file prior to visit.    Past Surgical History  Procedure Laterality Date  . Aortic valve replacement  10/09    50mm Magna Pericardial   . Esophagogastroduodenoscopy      scrapping of throat and stretching  . Lithotripsy    . Bone marrow aspiration    . Portacath placement    . 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 Colon surgery    . Dental surgery  08/2012  . Multiple tooth extractions    . Mass excision N/A 12/05/2012    Procedure: EXCISION MIDLINE NECK MASS;  Surgeon: Ascencion Dike, MD;  Location: [facility];  Service: ENT;  Laterality: N/A;  . Tongue biopsy  12/2012  . Cataract extraction w/phaco Right 01/07/2014    Procedure: CATARACT EXTRACTION PHACO AND INTRAOCULAR LENS PLACEMENT (IOC);  Surgeon: Tonny Branch, MD;  Location: [facility];  Service: Ophthalmology;  Laterality:  Right;  CDE:15.31  . Eye surgery Right 02/07/2014    cataract  . Cataract extraction w/phaco Left 03/25/2014    Procedure: CATARACT EXTRACTION PHACO AND INTRAOCULAR LENS PLACEMENT (IOC);  Surgeon: Tonny Branch, MD;  Location: [facility];  Service: Ophthalmology;  Laterality: Left;  CDE 6.55    Denies any headaches, dizziness, double vision, fevers, chills, night sweats, nausea, vomiting, diarrhea, constipation, chest pain, heart palpitations, shortness of breath, blood in stool, black tarry stool, urinary pain, urinary burning, urinary frequency, hematuria.   PHYSICAL EXAMINATION  ECOG PERFORMANCE STATUS: 1 - Symptomatic but completely ambulatory  Filed Vitals:   07/17/15 0903  BP: 122/66  Pulse: 66  Temp: 97.6 F (36.4 C)  Resp: 20    GENERAL:alert, no distress, comfortable, cooperative, obese, smiling and malodorous and unclean, unaccompanied. SKIN: skin color, texture, turgor are normal, no rashes or significant lesions HEAD: Normocephalic, No masses, lesions, tenderness or abnormalities EYES: normal, EOMI, Conjunctiva are pink and non-injected EARS: External ears normal OROPHARYNX:lips, buccal mucosa, and tongue normal, mucous membranes are  moist and very poor dentition and gingival health  NECK: supple, no adenopathy, thyroid normal size, non-tender, without nodularity, trachea midline LYMPH:  no palpable lymphadenopathy BREAST:not examined LUNGS: clear to auscultation  HEART: regular rate & rhythm, no murmurs, no gallops, S1 normal and S2 normal ABDOMEN:abdomen soft, non-tender, obese and normal bowel sounds BACK: Back symmetric, no curvature.  No tenderness to palpation, no rash, normal range of motion EXTREMITIES:less then 2 second capillary refill, no joint deformities, effusion, or inflammation, no skin discoloration, no clubbing, no cyanosis, negative findings: Negative straight leg test bilaterally.  NEURO: alert & oriented x 3 with fluent speech, gait  normal    LABORATORY DATA: CBC    Component Value Date/Time   WBC 110.4* 07/17/2015 0925   RBC 3.39* 07/17/2015 0925   RBC 3.42* 04/18/2015 1347   HGB 10.9* 07/17/2015 0925   HCT 34.1* 07/17/2015 0925   PLT 91* 07/17/2015 0925   MCV 100.6* 07/17/2015 0925   MCH 32.2 07/17/2015 0925   MCHC 32.0 07/17/2015 0925   RDW 16.0* 07/17/2015 0925   LYMPHSABS PENDING 07/17/2015 0925   MONOABS PENDING 07/17/2015 0925   EOSABS PENDING 07/17/2015 0925   BASOSABS PENDING 07/17/2015 0925      Chemistry      Component Value Date/Time   NA 140 07/17/2015 0925   K 4.4 07/17/2015 0925   CL 108 07/17/2015 0925   CO2 23 07/17/2015 0925   BUN 20 07/17/2015 0925   CREATININE 1.80* 07/17/2015 0925   CREATININE 1.59* 06/10/2015 1601      Component Value Date/Time   CALCIUM 8.7* 07/17/2015 0925   ALKPHOS 116 07/17/2015 0925   AST 23 07/17/2015 0925   ALT 17 07/17/2015 0925   BILITOT 0.3 07/17/2015 0925        PENDING LABS:   RADIOGRAPHIC STUDIES:  Dg Lumbar Spine Complete  07/17/2015  CLINICAL DATA:  Mid and left-sided low back pain radiating into the left leg at times ; duration of symptoms: 3 weeks. No known injury. EXAM: LUMBAR SPINE - COMPLETE 4+ VIEW COMPARISON:  Coronal and sagittal reconstructed images through the lumbar spine of October 09/15/2013 FINDINGS: The lumbar vertebral bodies are preserved in height. The disc space heights are well maintained. There is mild facet joint hypertrophy at L4-5 and at L5-S1. There is no spondylolisthesis. The pedicles and transverse processes are intact. IMPRESSION: There is no compression fracture nor significant disc space narrowing. There is facet joint hypertrophy at L4-5 and L5-S1. If the patient's symptoms persist, further evaluation with MRI may be useful Electronically Signed   By: David  Martinique M.D.   On: 07/17/2015 11:03   Dg Hip Unilat W Or W/o Pelvis 2-3 Views Left  07/17/2015  CLINICAL DATA:  Mid and left-sided low back pain  occasionally radiating into the left leg for the past 3 weeks without known injury. EXAM: DG HIP (WITH OR WITHOUT PELVIS) 2-3V LEFT COMPARISON:  Left hip series of June 30, 2015 FINDINGS: The bony pelvis appears adequately mineralized. There is no lytic or blastic lesion. No acute or old fracture is observed. AP and lateral views of the left hip reveal very mild symmetric narrowing of the joint space. The articular surfaces of the femoral head and acetabulum remains smoothly rounded. IMPRESSION: There is no acute bony abnormality of the left hip nor significant bony change. Minimal narrowing of the joint space is noted but is stable and likely reflects mild osteoarthritic change. Electronically Signed   By: David  Martinique M.D.  On: 07/17/2015 11:05   Dg Hip Unilat With Pelvis 2-3 Views Left  06/30/2015  CLINICAL DATA:  Left hip pain. EXAM: DG HIP (WITH OR WITHOUT PELVIS) 2-3 V LEFT COMPARISON:  None. FINDINGS: No evidence of fracture or dislocation. No bony lesions or destruction identified. No significant arthropathy seen. Soft tissues are unremarkable. The frontal pelvic film shows a normal appearance to the bony pelvis and contralateral right hip. No soft tissue abnormalities. IMPRESSION: Normal left hip films. Electronically Signed   By: Aletta Edouard M.D.   On: 06/30/2015 18:06     PATHOLOGY:    ASSESSMENT AND PLAN:  Leukemia, lymphocytic, chronic (HCC) CLL, having not required treatment to date.  Now with increasing WBC and declining platelet count.  Labs today as ordered.  With his platelet count below baseline, progressive anemia, and increasing WBC, I think it is prudent at this time to perform a bone marrow aspiration and biopsy to evaluate for progression of CLL. Order is placed for this procedure with interventional radiology at [facility].  He complains of low back pain, lateral to the spine, bilaterally and bilateral hip pain left greater than right. This is been evaluated by his  primary care physician and workup thus far including imaging has been negative. He's been using Tylenol with minimal benefit. He notes that discomfort is improved over the past 2 weeks but remains. Physical exam is negative for straight-leg tests bilaterally. Based upon symptoms, I suspect is musculoskeletal. [name] wishes to have repeat imaging performed to confirm no changes. Orders placed for plain films of left hip and L-spine. I reviewed the results, and left hip shows osteoarthritic changes and L-spine demonstrates facet joint hypertrophy at L4-5 and L5-S1.  Since patient reports symptoms are improving, would not pursue further imaging or testing. If discomfort persists, MRI imaging may be helpful.  Labs in 1 month: CBC with differential, complete metabolic panel.  Return in 1 month for follow-up. This appointment may be altered based upon bone marrow aspiration and biopsy results. [name] is aware of that.  Hodgkin's disease ([place]) Nodular sclerosis type Hodgkin Lymphoma diagnosed in 2006.  He remains without recurrence and at this point is considered cured of his disease.  We will continue with observation.   Hypogammaglobulinemia, acquired ([place]) Secondary to CLL and past treatment of Hodgkin's Lymphoma.  H/O of recurrent infections requiring IVIG in the past.  Anemia Multifactorial anemia, normocytic and normochromic, thought to be secondary to past treatment for Hodgkin's Lymphoma, CLL, and Stage III chronic renal disease.  STABLE  Thrombocytopenia (HCC) Progressive thrombocytopenia, likely secondary to CLL.    THERAPY PLAN:  Patient will pursue a bone marrow aspiration and biopsy to evaluate status of CLL given CBC progressive abnormalities.  All questions were answered. The patient knows to call the clinic with any problems, questions or concerns. We can certainly see the patient much sooner if necessary.  Patient and plan discussed with Dr. Ancil Linsey and she is in  agreement with the aforementioned.   This note is electronically signed by: Doy Mince 07/17/2015 2:32 PM

## 2015-07-16 NOTE — Assessment & Plan Note (Signed)
Nodular sclerosis-type Hodgkin Lymphoma diagnosed in 2006.  He remains without recurrence and at this point is considered cured of his disease.  We will continue with observation.  

## 2015-07-16 NOTE — Assessment & Plan Note (Signed)
Secondary to CLL and past treatment of Hodgkin's Lymphoma.  H/O of recurrent infections requiring IVIG in the past.

## 2015-07-16 NOTE — Assessment & Plan Note (Addendum)
CLL, having not required treatment to date.  Now with increasing WBC and declining platelet count.  Labs today as ordered.  With his platelet count below baseline, progressive anemia, and increasing WBC, I think it is prudent at this time to perform a bone marrow aspiration and biopsy to evaluate for progression of CLL. Order is placed for this procedure with interventional radiology at [facility].  He complains of low back pain, lateral to the spine, bilaterally and bilateral hip pain left greater than right. This is been evaluated by his primary care physician and workup thus far including imaging has been negative. He's been using Tylenol with minimal benefit. He notes that discomfort is improved over the past 2 weeks but remains. Physical exam is negative for straight-leg tests bilaterally. Based upon symptoms, I suspect is musculoskeletal. Seiichi wishes to have repeat imaging performed to confirm no changes. Orders placed for plain films of left hip and L-spine. I reviewed the results, and left hip shows osteoarthritic changes and L-spine demonstrates facet joint hypertrophy at L4-5 and L5-S1.  Since patient reports symptoms are improving, would not pursue further imaging or testing. If discomfort persists, MRI imaging may be helpful.  Labs in 1 month: CBC with differential, complete metabolic panel.  Return in 1 month for follow-up. This appointment may be altered based upon bone marrow aspiration and biopsy results. [facility] is aware of that.

## 2015-07-17 ENCOUNTER — Encounter (HOSPITAL_COMMUNITY): Payer: Medicare Other | Attending: Oncology

## 2015-07-17 ENCOUNTER — Encounter (HOSPITAL_COMMUNITY): Payer: Self-pay | Admitting: Oncology

## 2015-07-17 ENCOUNTER — Ambulatory Visit (HOSPITAL_COMMUNITY): Payer: Medicare Other | Admitting: Oncology

## 2015-07-17 ENCOUNTER — Encounter (HOSPITAL_BASED_OUTPATIENT_CLINIC_OR_DEPARTMENT_OTHER): Payer: Medicare Other | Admitting: Oncology

## 2015-07-17 ENCOUNTER — Ambulatory Visit (HOSPITAL_COMMUNITY)
Admission: RE | Admit: 2015-07-17 | Discharge: 2015-07-17 | Disposition: A | Discharge status: Home / Self Care | Payer: Medicare Other | Source: Ambulatory Visit | Attending: Oncology | Admitting: Oncology

## 2015-07-17 VITALS — BP 122/66 | HR 66 | Temp 97.6°F | Resp 20 | Wt 232.0 lb

## 2015-07-17 DIAGNOSIS — M545 Low back pain, unspecified: Secondary | ICD-10-CM

## 2015-07-17 DIAGNOSIS — M25552 Pain in left hip: Secondary | ICD-10-CM | POA: Diagnosis not present

## 2015-07-17 DIAGNOSIS — C9110 Chronic lymphocytic leukemia of B-cell type not having achieved remission: Secondary | ICD-10-CM

## 2015-07-17 DIAGNOSIS — D801 Nonfamilial hypogammaglobulinemia: Secondary | ICD-10-CM

## 2015-07-17 DIAGNOSIS — C8190 Hodgkin lymphoma, unspecified, unspecified site: Secondary | ICD-10-CM | POA: Insufficient documentation

## 2015-07-17 DIAGNOSIS — D696 Thrombocytopenia, unspecified: Secondary | ICD-10-CM

## 2015-07-17 DIAGNOSIS — D649 Anemia, unspecified: Secondary | ICD-10-CM

## 2015-07-17 DIAGNOSIS — D630 Anemia in neoplastic disease: Secondary | ICD-10-CM

## 2015-07-17 DIAGNOSIS — Z23 Encounter for immunization: Secondary | ICD-10-CM | POA: Diagnosis not present

## 2015-07-17 LAB — COMPREHENSIVE METABOLIC PANEL
ALK PHOS: 116 U/L (ref 38–126)
ALT: 17 U/L (ref 17–63)
ANION GAP: 9 (ref 5–15)
AST: 23 U/L (ref 15–41)
Albumin: 3.7 g/dL (ref 3.5–5.0)
BUN: 20 mg/dL (ref 6–20)
CALCIUM: 8.7 mg/dL — AB (ref 8.9–10.3)
CO2: 23 mmol/L (ref 22–32)
CREATININE: 1.8 mg/dL — AB (ref 0.61–1.24)
Chloride: 108 mmol/L (ref 101–111)
GFR, EST AFRICAN AMERICAN: 42 mL/min — AB (ref >60)
GFR, EST NON AFRICAN AMERICAN: 36 mL/min — AB (ref >60)
Glucose, Bld: 110 mg/dL — ABNORMAL HIGH (ref 65–99)
Potassium: 4.4 mmol/L (ref 3.5–5.1)
SODIUM: 140 mmol/L (ref 135–145)
Total Bilirubin: 0.3 mg/dL (ref 0.3–1.2)
Total Protein: 5.8 g/dL — ABNORMAL LOW (ref 6.5–8.1)

## 2015-07-17 LAB — CBC WITH DIFFERENTIAL/PLATELET
BASOS PCT: 0 %
Basophils Absolute: 0.3 10*3/uL — ABNORMAL HIGH (ref 0.0–0.1)
EOS ABS: 2.2 10*3/uL — AB (ref 0.0–0.7)
Eosinophils Relative: 2 %
HCT: 34.1 % — ABNORMAL LOW (ref 39.0–52.0)
HEMOGLOBIN: 10.9 g/dL — AB (ref 13.0–17.0)
LYMPHS ABS: 106.8 10*3/uL — AB (ref 0.7–4.0)
Lymphocytes Relative: 95 %
MCH: 32.2 pg (ref 26.0–34.0)
MCHC: 32 g/dL (ref 30.0–36.0)
MCV: 100.6 fL — AB (ref 78.0–100.0)
MONO ABS: 1.3 10*3/uL — AB (ref 0.1–1.0)
MONOS PCT: 1 %
Neutro Abs: 1.5 10*3/uL — ABNORMAL LOW (ref 1.7–7.7)
Neutrophils Relative %: 1 %
PLATELETS: 91 10*3/uL — AB (ref 150–400)
RBC: 3.39 MIL/uL — ABNORMAL LOW (ref 4.22–5.81)
RDW: 16 % — ABNORMAL HIGH (ref 11.5–15.5)
WBC: 110.4 10*3/uL (ref 4.0–10.5)

## 2015-07-17 MED ORDER — HEPARIN SOD (PORK) LOCK FLUSH 100 UNIT/ML IV SOLN
500.0000 [IU] | Freq: Once | INTRAVENOUS | Status: AC
  Administered 2015-07-17: 500 [IU] via INTRAVENOUS
  Filled 2015-07-17: qty 5

## 2015-07-17 MED ORDER — SODIUM CHLORIDE 0.9% FLUSH
20.0000 mL | INTRAVENOUS | Status: DC | PRN
  Administered 2015-07-17: 20 mL via INTRAVENOUS
  Filled 2015-07-17: qty 20

## 2015-07-17 NOTE — Patient Instructions (Signed)
[place] at [facility] Discharge Instructions  RECOMMENDATIONS MADE BY THE CONSULTANT AND ANY TEST RESULTS WILL BE SENT TO YOUR REFERRING PHYSICIAN.  Exam done and seen today by [facility] with port flush done today. Will need a bone marrow aspiration and biopsy done Xrays will be scheduled on left spine, left hip/pelvis Return to see the PA in one month and labs in one month Call the clinic for any concerns or questions.   Thank you for choosing [place] at [facility] to provide your oncology and hematology care.  To afford each patient quality time with our provider, please arrive at least 15 minutes before your scheduled appointment time.   Beginning January 23rd 2017 lab work for the [facility] will be done in the  Main lab at [facility] on 1st floor. If you have a lab appointment with the [facility] please come in thru the  Main Entrance and check in at the main information desk  You need to re-schedule your appointment should you arrive 10 or more minutes late.  We strive to give you quality time with our providers, and arriving late affects you and other patients whose appointments are after yours.  Also, if you no show three or more times for appointments you may be dismissed from the clinic at the providers discretion.     Again, thank you for choosing [facility].  Our hope is that these requests will decrease the amount of time that you wait before being seen by our physicians.       _____________________________________________________________  Should you have questions after your visit to [facility], please contact our office at [phone] between the hours of 8:30 a.m. and 4:30 p.m.  Voicemails left after 4:30 p.m. will not be returned until the following business day.  For prescription refill requests, have your pharmacy contact our office.         Resources For Cancer Patients  and their Caregivers ? American Cancer Society: Can assist with transportation, wigs, general needs, runs Look Good Feel Better.        706-060-3270 ? Cancer Care: Provides financial assistance, online support groups, medication/co-pay assistance.  1-800-813-HOPE (612) 799-2308) ? [facility] Assists [place] cancer patients and their families through emotional , educational and financial support.  909-699-4319 ? [place] DSS Where to apply for food stamps, Medicaid and utility assistance. 667-659-9250 ? RC[employer]: Transportation to medical appointments. (628)616-2907 ? Social Security Administration: May apply for disability if have a Stage IV cancer. 223-258-5912 (838)281-5754 ? [employer], Disability and Transit Services: Assists with nutrition, care and transit needs. ([phone] 311-6209

## 2015-07-17 NOTE — Progress Notes (Signed)
[name] presented for Portacath access and flush. Proper placement of portacath confirmed by CXR. Portacath located right chest wall accessed with  H 20 needle. Good blood return present.  Specimen drawn for labs.   Portacath flushed with 38ml NS and 500U/22ml Heparin and needle removed intact. Procedure without incident. Patient tolerated procedure well.

## 2015-07-17 NOTE — Patient Instructions (Signed)
[facility] at [facility] Discharge Instructions  RECOMMENDATIONS MADE BY THE CONSULTANT AND ANY TEST RESULTS WILL BE SENT TO YOUR REFERRING PHYSICIAN.  Port flush today.    Thank you for choosing [facility] at [facility] to provide your oncology and hematology care.  To afford each patient quality time with our provider, please arrive at least 15 minutes before your scheduled appointment time.   Beginning January 23rd 2017 lab work for the Cancer Center will be done in the  Main lab at [facility] on 1st floor. If you have a lab appointment with the Cancer Center please come in thru the  Main Entrance and check in at the main information desk  You need to re-schedule your appointment should you arrive 10 or more minutes late.  We strive to give you quality time with our providers, and arriving late affects you and other patients whose appointments are after yours.  Also, if you no show three or more times for appointments you may be dismissed from the clinic at the providers discretion.     Again, thank you for choosing [facility].  Our hope is that these requests will decrease the amount of time that you wait before being seen by our physicians.       _____________________________________________________________  Should you have questions after your visit to  Cancer Center, please contact our office at [phone] between the hours of 8:30 a.m. and 4:30 p.m.  Voicemails left after 4:30 p.m. will not be returned until the following business day.  For prescription refill requests, have your pharmacy contact our office.         Resources For Cancer Patients and their Caregivers ? American Cancer Society: Can assist with transportation, wigs, general needs, runs Look Good Feel Better.        1-888-227-6333 ? Cancer Care: Provides financial assistance, online support groups, medication/co-pay assistance.   1-800-813-HOPE (4673) ? [facility] Assists [place] cancer patients and their families through emotional , educational and financial support.  336-427-4357 ? [place] DSS Where to apply for food stamps, Medicaid and utility assistance. 336-342-1394 ? RCATS: Transportation to medical appointments. 336-347-2287 ? Social Security Administration: May apply for disability if have a Stage IV cancer. 336-342-7796 1-800-772-1213 ? [place] [facility], Disability and Transit Services: Assists with nutrition, care and transit needs. 336-349-2343  

## 2015-07-17 NOTE — Progress Notes (Signed)
CRITICAL VALUE ALERT Critical value received: WBC 110,400 Date of notification:  07/17/15 Time of notification: 1250 Critical value read back:  Yes.   Nurse who received alert:  C.Sayid Moll RN MD notified Kirby Crigler PA-C notified

## 2015-07-18 NOTE — Progress Notes (Signed)
Noted, patient is aware. 

## 2015-07-20 ENCOUNTER — Other Ambulatory Visit: Payer: Self-pay | Admitting: Radiology

## 2015-07-21 ENCOUNTER — Other Ambulatory Visit (HOSPITAL_COMMUNITY): Payer: Self-pay | Admitting: Oncology

## 2015-07-21 ENCOUNTER — Other Ambulatory Visit: Payer: Self-pay | Admitting: Radiology

## 2015-07-22 ENCOUNTER — Ambulatory Visit (HOSPITAL_COMMUNITY)
Admission: RE | Admit: 2015-07-22 | Discharge: 2015-07-22 | Disposition: A | Discharge status: Home / Self Care | Payer: Medicare Other | Source: Ambulatory Visit | Attending: Oncology | Admitting: Oncology

## 2015-07-22 ENCOUNTER — Encounter (HOSPITAL_COMMUNITY): Payer: Self-pay

## 2015-07-22 DIAGNOSIS — D696 Thrombocytopenia, unspecified: Secondary | ICD-10-CM | POA: Insufficient documentation

## 2015-07-22 DIAGNOSIS — D801 Nonfamilial hypogammaglobulinemia: Secondary | ICD-10-CM | POA: Diagnosis not present

## 2015-07-22 DIAGNOSIS — C9110 Chronic lymphocytic leukemia of B-cell type not having achieved remission: Secondary | ICD-10-CM

## 2015-07-22 DIAGNOSIS — R531 Weakness: Secondary | ICD-10-CM | POA: Diagnosis not present

## 2015-07-22 DIAGNOSIS — R05 Cough: Secondary | ICD-10-CM | POA: Diagnosis not present

## 2015-07-22 DIAGNOSIS — M25559 Pain in unspecified hip: Secondary | ICD-10-CM | POA: Insufficient documentation

## 2015-07-22 DIAGNOSIS — R718 Other abnormality of red blood cells: Secondary | ICD-10-CM | POA: Insufficient documentation

## 2015-07-22 DIAGNOSIS — Z0189 Encounter for other specified special examinations: Secondary | ICD-10-CM | POA: Insufficient documentation

## 2015-07-22 DIAGNOSIS — D649 Anemia, unspecified: Secondary | ICD-10-CM | POA: Diagnosis not present

## 2015-07-22 DIAGNOSIS — Z8571 Personal history of Hodgkin lymphoma: Secondary | ICD-10-CM | POA: Diagnosis not present

## 2015-07-22 DIAGNOSIS — Z7982 Long term (current) use of aspirin: Secondary | ICD-10-CM | POA: Diagnosis not present

## 2015-07-22 DIAGNOSIS — G629 Polyneuropathy, unspecified: Secondary | ICD-10-CM | POA: Diagnosis not present

## 2015-07-22 LAB — CBC WITH DIFFERENTIAL/PLATELET
BASOS ABS: 0 10*3/uL (ref 0.0–0.1)
BLASTS: 0 %
Band Neutrophils: 0 %
Basophils Relative: 0 %
Eosinophils Absolute: 2.2 10*3/uL — ABNORMAL HIGH (ref 0.0–0.7)
Eosinophils Relative: 2 %
HCT: 35.1 % — ABNORMAL LOW (ref 39.0–52.0)
HEMOGLOBIN: 11.1 g/dL — AB (ref 13.0–17.0)
LYMPHS PCT: 92 %
Lymphs Abs: 102.7 10*3/uL — ABNORMAL HIGH (ref 0.7–4.0)
MCH: 31.1 pg (ref 26.0–34.0)
MCHC: 31.6 g/dL (ref 30.0–36.0)
MCV: 98.3 fL (ref 78.0–100.0)
METAMYELOCYTES PCT: 0 %
MONOS PCT: 5 %
MYELOCYTES: 0 %
Monocytes Absolute: 5.6 10*3/uL — ABNORMAL HIGH (ref 0.1–1.0)
NRBC: 0 /100{WBCs}
Neutro Abs: 1.1 10*3/uL — ABNORMAL LOW (ref 1.7–7.7)
Neutrophils Relative %: 1 %
Other: 0 %
PROMYELOCYTES ABS: 0 %
Platelets: 89 10*3/uL — ABNORMAL LOW (ref 150–400)
RBC: 3.57 MIL/uL — AB (ref 4.22–5.81)
RDW: 15.9 % — ABNORMAL HIGH (ref 11.5–15.5)
WBC: 111.6 10*3/uL (ref 4.0–10.5)

## 2015-07-22 LAB — BASIC METABOLIC PANEL
ANION GAP: 10 (ref 5–15)
BUN: 16 mg/dL (ref 6–20)
CALCIUM: 9.2 mg/dL (ref 8.9–10.3)
CO2: 23 mmol/L (ref 22–32)
Chloride: 110 mmol/L (ref 101–111)
Creatinine, Ser: 1.55 mg/dL — ABNORMAL HIGH (ref 0.61–1.24)
GFR calc Af Amer: 50 mL/min — ABNORMAL LOW (ref >60)
GFR calc non Af Amer: 43 mL/min — ABNORMAL LOW (ref >60)
GLUCOSE: 105 mg/dL — AB (ref 65–99)
Potassium: 5.1 mmol/L (ref 3.5–5.1)
Sodium: 143 mmol/L (ref 135–145)

## 2015-07-22 LAB — PROTIME-INR
INR: 1.1 (ref 0.00–1.49)
Prothrombin Time: 14.4 seconds (ref 11.6–15.2)

## 2015-07-22 LAB — BONE MARROW EXAM

## 2015-07-22 MED ORDER — FENTANYL CITRATE (PF) 100 MCG/2ML IJ SOLN
INTRAMUSCULAR | Status: AC | PRN
  Administered 2015-07-22 (×2): 25 ug via INTRAVENOUS

## 2015-07-22 MED ORDER — FENTANYL CITRATE (PF) 100 MCG/2ML IJ SOLN
INTRAMUSCULAR | Status: AC
  Filled 2015-07-22: qty 2

## 2015-07-22 MED ORDER — SODIUM CHLORIDE 0.9 % IV SOLN: INTRAVENOUS | Status: DC

## 2015-07-22 MED ORDER — MIDAZOLAM HCL 2 MG/2ML IJ SOLN
INTRAMUSCULAR | Status: AC
  Filled 2015-07-22: qty 4

## 2015-07-22 MED ORDER — MIDAZOLAM HCL 2 MG/2ML IJ SOLN
INTRAMUSCULAR | Status: AC | PRN
  Administered 2015-07-22: 1 mg via INTRAVENOUS
  Administered 2015-07-22: 0.5 mg via INTRAVENOUS

## 2015-07-22 NOTE — Sedation Documentation (Signed)
Patient denies pain and is resting comfortably.  

## 2015-07-22 NOTE — Discharge Instructions (Signed)
Moderate Conscious Sedation, Adult, Care After °Refer to this sheet in the next few weeks. These instructions provide you with information on caring for yourself after your procedure. Your health care provider may also give you more specific instructions. Your treatment has been planned according to current medical practices, but problems sometimes occur. Call your health care provider if you have any problems or questions after your procedure. °WHAT TO EXPECT AFTER THE PROCEDURE  °After your procedure: °· You may feel sleepy, clumsy, and have poor balance for several hours. °· Vomiting may occur if you eat too soon after the procedure. °HOME CARE INSTRUCTIONS °· Do not participate in any activities where you could become injured for at least 24 hours. Do not: °¨ Drive. °¨ Swim. °¨ Ride a bicycle. °¨ Operate heavy machinery. °¨ Cook. °¨ Use power tools. °¨ Climb ladders. °¨ Work from a high place. °· Do not make important decisions or sign legal documents until you are improved. °· If you vomit, drink water, juice, or soup when you can drink without vomiting. Make sure you have little or no nausea before eating solid foods. °· Only take over-the-counter or prescription medicines for pain, discomfort, or fever as directed by your health care provider. °· Make sure you and your family fully understand everything about the medicines given to you, including what side effects may occur. °· You should not drink alcohol, take sleeping pills, or take medicines that cause drowsiness for at least 24 hours. °· If you smoke, do not smoke without supervision. °· If you are feeling better, you may resume normal activities 24 hours after you were sedated. °· Keep all appointments with your health care provider. °SEEK MEDICAL CARE IF: °· Your skin is pale or bluish in color. °· You continue to feel nauseous or vomit. °· Your pain is getting worse and is not helped by medicine. °· You have bleeding or swelling. °· You are still  sleepy or feeling clumsy after 24 hours. °SEEK IMMEDIATE MEDICAL CARE IF: °· You develop a rash. °· You have difficulty breathing. °· You develop any type of allergic problem. °· You have a fever. °MAKE SURE YOU: °· Understand these instructions. °· Will watch your condition. °· Will get help right away if you are not doing well or get worse. °  °This information is not intended to replace advice given to you by your health care provider. Make sure you discuss any questions you have with your health care provider. °  °Document Released: 01/03/2013 Document Revised: 04/05/2014 Document Reviewed: 01/03/2013 °Elsevier Interactive Patient Education ©2016 [employer] °Bone Marrow Aspiration and Bone Marrow Biopsy, Care After °Refer to this sheet in the next few weeks. These instructions provide you with information about caring for yourself after your procedure. Your health care provider may also give you more specific instructions. Your treatment has been planned according to current medical practices, but problems sometimes occur. Call your health care provider if you have any problems or questions after your procedure. °WHAT TO EXPECT AFTER THE PROCEDURE °After your procedure, it is common to have: °· Soreness or tenderness around the puncture site. °· Bruising. °HOME CARE INSTRUCTIONS °· Take medicines only as directed by your health care provider. °· Follow your health care provider's instructions about: °¨ Puncture site care. °¨ Bandage (dressing) changes and removal. °· Bathe and shower as directed by your health care provider. °· Check your puncture site every day for signs of infection. Watch for: °¨ Redness, swelling, or pain. °¨   Fluid, blood, or pus. °· Return to your normal activities as directed by your health care provider. °· Keep all follow-up visits as directed by your health care provider. This is important. °SEEK MEDICAL CARE IF: °· You have a fever. °· You have uncontrollable bleeding. °· You have  redness, swelling, or pain at the site of your puncture. °· You have fluid, blood, or pus coming from your puncture site. °  °This information is not intended to replace advice given to you by your health care provider. Make sure you discuss any questions you have with your health care provider. °  °Document Released: 10/02/2004 Document Revised: 07/30/2014 Document Reviewed: 03/06/2014 °Elsevier Interactive Patient Education ©2016 Elsevier Inc. ° °

## 2015-07-22 NOTE — Progress Notes (Signed)
CRITICAL VALUE ALERT  Critical value received:  111.6 wbc  Date of notification:  07/22/15  Time of notification:  [id number]  Critical value read back:Yes.    Nurse who received alert:  L.Marlowe Alt  MD notified (1st page):  Notified Rowe Robert, PA in the hallway verbally at 1045  Time of first page:  n/a  MD notified (2nd page):n/a  Time of second page:n/a  Responding MD:  n/a  Time MD responded:  n/a

## 2015-07-22 NOTE — H&P (Signed)
Referring Physician(s): Baird Cancer  Supervising Physician: Sandi Mariscal  Patient Status: OP  Chief Complaint:  "I'm having another bone marrow biopsy"  Subjective:  Pt familiar to IR service from prior CT guided bone marrow biopsy in October 2014. He has a history of Hodgkin's lymphoma diagnosed in 2006, hypogammaglobulinemia, and CLL now with progressive anemia, increasing WBC and declining platelet count. He presents today for another CT-guided bone marrow biopsy to evaluate for progression of CLL. He currently denies fever, headache, chest pain, dyspnea, abdominal pain, nausea, vomiting or abnormal bleeding. He does have occasional cough, back/hip pain, occasional lower extremity neuropathy, weakness/fatigue.   Allergies: Azithromycin  Medications: Prior to Admission medications   Medication Sig Start Date End Date Taking? Authorizing Provider  acyclovir (ZOVIRAX) 200 MG capsule TAKE ONE CAPSULE BY MOUTH TWICE DAILY 07/21/15  Yes Baird Cancer, PA-C  aspirin EC 81 MG tablet Take 81 mg by mouth daily.   Yes Historical Provider, MD  folic acid (FOLVITE) 1 MG tablet TAKE ONE TABLET BY MOUTH ONCE DAILY 03/28/15  Yes Manon Hilding Kefalas, PA-C  metoprolol (LOPRESSOR) 50 MG tablet TAKE ONE TABLET BY MOUTH TWICE DAILY 03/28/15  Yes Satira Sark, MD  Multiple Vitamins-Minerals (CENTRUM SILVER ULTRA MENS PO) Take 1 tablet by mouth every morning.    Yes Historical Provider, MD  OMEGA 3 1000 MG CAPS Take 1,000 mg by mouth daily.    Yes Historical Provider, MD  omeprazole (PRILOSEC) 20 MG capsule Take 20 mg by mouth every morning.    Yes Historical Provider, MD  penicillin v potassium (VEETID) 500 MG tablet Take 1 tablet (500 mg total) by mouth 3 (three) times daily. 06/13/15  Yes [name], MD  simvastatin (ZOCOR) 40 MG tablet Take 1 tablet (40 mg total) by mouth at bedtime. 04/29/14  Yes Satira Sark, MD  sulfamethoxazole-trimethoprim (BACTRIM DS,SEPTRA DS) 800-160  MG tablet  10/29/14  Yes Historical Provider, MD  acetaminophen (TYLENOL) 500 MG tablet Take 1,000 mg by mouth every 6 (six) hours as needed for fever.     Historical Provider, MD  azithromycin (ZITHROMAX) 250 MG tablet Two tablets on day one, then one tablet once daily for an additional 4 days Patient not taking: Reported on 07/17/2015 06/10/15   [name], MD  oseltamivir (TAMIFLU) 75 MG capsule Take 1 capsule (75 mg total) by mouth 2 (two) times daily. 06/10/15   [name], MD     Vital Signs:Blood pressure 132/59, temperature 97.8, heart rate 83, respirations 18, O2 sat 98% room air  Physical Exam awake, alert. Chest clear to auscultation bilaterally. Heart with regular rate and rhythm. Abdomen soft, positive bowel sounds, nontender. Lower extremities with 1+ edema bilaterally.  Imaging: No results found.  Labs:  CBC:  Recent Labs  03/13/15 1307 04/18/15 1347 06/10/15 1601 07/17/15 0925  WBC 32.3* 50.5* 47.9* 110.4*  HGB 10.9* 11.3* 12.0* 10.9*  HCT 33.6* 34.7* 36.2* 34.1*  PLT 94* 91* 80* 91*    COAGS: No results for input(s): INR, APTT in the last 8760 hours.  BMP:  Recent Labs  01/16/15 1415 03/13/15 1307 04/18/15 1347 06/10/15 1601 07/17/15 0925  NA 139 137 141 137 140  K 4.4 4.4 4.7 4.3 4.4  CL 109 106 108 104 108  CO2 _0 GLUCOSE 103* 120* 106* 116* 110*  BUN _1 CALCIUM 8.8* 8.6* 8.9 8.6 8.7*  CREATININE 1.87* 1.81* 1.94* 1.59*  1.80*  GFRNONAA 35* 36* 33*  --  36*  GFRAA 40* 42* 38*  --  42*    LIVER FUNCTION TESTS:  Recent Labs  03/13/15 1307 04/18/15 1347 06/10/15 1601 07/17/15 0925  BILITOT 0.6 0.4 0.5 0.3  AST _0 ALT 13* 13* 17 17  ALKPHOS 87 93 117* 116  PROT 5.6* 5.8* 5.6* 5.8*  ALBUMIN 3.6 4.0 4.0 3.7    Assessment and Plan: Patient with remote history of Hodgkin's lymphoma 2006, hypogammaglobulinemia, CLL and now with worsening platelet count, anemia and increasing WBC. Plan today  is for CT-guided bone marrow biopsy to evaluate for progression of CLL.Risks and benefits discussed with the patient/son including, but not limited to bleeding, infection, damage to adjacent structures or low yield requiring additional tests.All of the patient's questions were answered, patient is agreeable to proceed.Consent signed and in chart.     Electronically Signed: D. Rowe Robert 07/22/2015, 9:47 AM   I spent a total of 20 minutes at the the patient's bedside AND on the patient's hospital floor or unit, greater than 50% of which was counseling/coordinating care for CT guided bone marrow biopsy

## 2015-07-22 NOTE — Procedures (Signed)
Technically successful CT guided bone marrow aspiration and biopsy of left iliac crest. EBL: None No immediate complications.    SignedSandi Mariscal [phone] 07/22/2015, 11:50 AM

## 2015-07-28 ENCOUNTER — Telehealth (HOSPITAL_COMMUNITY): Payer: Self-pay | Admitting: *Deleted

## 2015-07-28 NOTE — Telephone Encounter (Signed)
Pt notified that it may take about 2 weeks to get all the results back from the [place], pt verbalized understanding

## 2015-07-29 ENCOUNTER — Other Ambulatory Visit (HOSPITAL_COMMUNITY): Payer: Self-pay | Admitting: Oncology

## 2015-07-29 DIAGNOSIS — C9110 Chronic lymphocytic leukemia of B-cell type not having achieved remission: Secondary | ICD-10-CM

## 2015-07-29 MED ORDER — IBRUTINIB 140 MG PO CAPS: 420.0000 mg | ORAL_CAPSULE | Freq: Every day | ORAL | Status: DC

## 2015-08-05 LAB — CHROMOSOME ANALYSIS, BONE MARROW

## 2015-08-05 LAB — TISSUE HYBRIDIZATION (BONE MARROW)-NCBH

## 2015-08-15 ENCOUNTER — Encounter (HOSPITAL_COMMUNITY): Payer: Self-pay

## 2015-08-15 ENCOUNTER — Encounter (HOSPITAL_COMMUNITY): Payer: Medicare Other

## 2015-08-15 ENCOUNTER — Other Ambulatory Visit: Payer: Self-pay

## 2015-08-15 ENCOUNTER — Encounter (HOSPITAL_COMMUNITY): Payer: Medicare Other | Attending: Oncology | Admitting: Oncology

## 2015-08-15 VITALS — BP 108/62 | HR 75 | Temp 98.4°F | Resp 20 | Wt 226.0 lb

## 2015-08-15 DIAGNOSIS — C8190 Hodgkin lymphoma, unspecified, unspecified site: Secondary | ICD-10-CM | POA: Insufficient documentation

## 2015-08-15 DIAGNOSIS — Z23 Encounter for immunization: Secondary | ICD-10-CM | POA: Diagnosis not present

## 2015-08-15 DIAGNOSIS — C9110 Chronic lymphocytic leukemia of B-cell type not having achieved remission: Secondary | ICD-10-CM

## 2015-08-15 DIAGNOSIS — D801 Nonfamilial hypogammaglobulinemia: Secondary | ICD-10-CM | POA: Diagnosis not present

## 2015-08-15 DIAGNOSIS — R093 Abnormal sputum: Secondary | ICD-10-CM | POA: Diagnosis not present

## 2015-08-15 DIAGNOSIS — Z8571 Personal history of Hodgkin lymphoma: Secondary | ICD-10-CM

## 2015-08-15 DIAGNOSIS — J069 Acute upper respiratory infection, unspecified: Secondary | ICD-10-CM

## 2015-08-15 DIAGNOSIS — N183 Chronic kidney disease, stage 3 (moderate): Secondary | ICD-10-CM | POA: Diagnosis not present

## 2015-08-15 LAB — COMPREHENSIVE METABOLIC PANEL
ALBUMIN: 3.8 g/dL (ref 3.5–5.0)
ALK PHOS: 126 U/L (ref 38–126)
ALT: 15 U/L — AB (ref 17–63)
AST: 19 U/L (ref 15–41)
Anion gap: 8 (ref 5–15)
BUN: 18 mg/dL (ref 6–20)
CALCIUM: 8.9 mg/dL (ref 8.9–10.3)
CO2: 24 mmol/L (ref 22–32)
CREATININE: 1.74 mg/dL — AB (ref 0.61–1.24)
Chloride: 108 mmol/L (ref 101–111)
GFR calc non Af Amer: 38 mL/min — ABNORMAL LOW (ref >60)
GFR, EST AFRICAN AMERICAN: 44 mL/min — AB (ref >60)
GLUCOSE: 111 mg/dL — AB (ref 65–99)
Potassium: 4.8 mmol/L (ref 3.5–5.1)
SODIUM: 140 mmol/L (ref 135–145)
Total Bilirubin: 0.4 mg/dL (ref 0.3–1.2)
Total Protein: 6.2 g/dL — ABNORMAL LOW (ref 6.5–8.1)

## 2015-08-15 LAB — CBC WITH DIFFERENTIAL/PLATELET
BASOS PCT: 0 %
Basophils Absolute: 0.8 10*3/uL — ABNORMAL HIGH (ref 0.0–0.1)
EOS PCT: 1 %
Eosinophils Absolute: 1.9 10*3/uL — ABNORMAL HIGH (ref 0.0–0.7)
HEMATOCRIT: 31.9 % — AB (ref 39.0–52.0)
HEMOGLOBIN: 10.5 g/dL — AB (ref 13.0–17.0)
Lymphocytes Relative: 94 %
Lymphs Abs: 173.5 10*3/uL — ABNORMAL HIGH (ref 0.7–4.0)
MCH: 33 pg (ref 26.0–34.0)
MCHC: 32.9 g/dL (ref 30.0–36.0)
MCV: 100.3 fL — ABNORMAL HIGH (ref 78.0–100.0)
MONO ABS: 3.1 10*3/uL — AB (ref 0.1–1.0)
MONOS PCT: 2 %
NEUTROS ABS: 4.4 10*3/uL (ref 1.7–7.7)
Neutrophils Relative %: 3 %
Platelets: 110 10*3/uL — ABNORMAL LOW (ref 150–400)
RBC: 3.18 MIL/uL — ABNORMAL LOW (ref 4.22–5.81)
RDW: 16.7 % — ABNORMAL HIGH (ref 11.5–15.5)
WBC: 183.7 10*3/uL (ref 4.0–10.5)

## 2015-08-15 LAB — URIC ACID: Uric Acid, Serum: 9.7 mg/dL — ABNORMAL HIGH (ref 4.4–7.6)

## 2015-08-15 LAB — LACTATE DEHYDROGENASE: LDH: 162 U/L (ref 98–192)

## 2015-08-15 LAB — MAGNESIUM: Magnesium: 2.2 mg/dL (ref 1.7–2.4)

## 2015-08-15 LAB — PHOSPHORUS: Phosphorus: 3.7 mg/dL (ref 2.5–4.6)

## 2015-08-15 NOTE — Assessment & Plan Note (Addendum)
CLL, having not required treatment to date.  Now with increasing WBC and declining platelet count.  S/P bone marrow aspiration and biopsy on 07/22/2015 demonstrating diffuse involvement of CLL.  Oncology history is updated.  I personally reviewed and went over pathology results with the patient.  His bone marrow aspiration and biopsy demonstrates extensive involvement of CLL.  As a result of this and progressive CBC abnormalities, he would benefit from treatment.  We discussed options including Ibrutinib and Obinutuzumab + Chlorambucil (both category 1 recommendations).  We discussed the pros and cons of each option including the risks, benefits, alternatives, and side effects.  At this time, [name] wishes to pursue Ibrutinib as it is a PO option for him and is well tolerated.  He will need chemotherapy teaching regarding this medication.  We will get baseline EKG today.  EKG shows NSR.  I have ordered baseline CT CAP with contrast within the next week.  Patient has signs and symptoms of URI.  Given his immunocompromised status, I have offered him an antibiotic. At this time, he refuses.  He is advised to call on Monday if he is not improved as he will need an antibiotic.  He is afebrile today with stable vitals.  He will return in ~ 2 weeks for labs: CBC diff, CMET, uric acid, Phosphorus, Magnesium, LDH.  Return in ~ 2 weeks for follow-up to evaluate tolerance to therapy.   AddendumRonalee [name] provided me some paper work from [employer].  There is a section for [place] to fill out.  This is provided to Angie.

## 2015-08-15 NOTE — Patient Instructions (Signed)
[name] at [facility] Discharge Instructions  RECOMMENDATIONS MADE BY THE CONSULTANT AND ANY TEST RESULTS WILL BE SENT TO YOUR REFERRING PHYSICIAN.  I reviewed your bone marrow results with you. Your bone marrow demonstrates extensive involvement of chronic lymphocytic leukemia. I've also reviewed your laboratory work with you today. Your white blood cell count continues to climb evidence of progression of chronic lymphocytic leukemia. With your climbing white blood cell count, low platelet count, and extensive involvement of disease in your bone marrow, it is recommended that you consider therapy. I reviewed options with you regarding treatment. At this time it seems as though [name] is your treatment of choice. We will ascertain an EKG today. We will get you set up for CT scans in approximately one week We'll get you set up for chemotherapy teaching We will work on getting this medication ar a cost-effective price. He will return in approximately 2-3 weeks for follow-up including laboratory work.  Thank you for choosing [place] at [facility] to provide your oncology and hematology care.  To afford each patient quality time with our provider, please arrive at least 15 minutes before your scheduled appointment time.   Beginning January 23rd 2017 lab work for the [facility] will be done in the  Main lab at [facility] on 1st floor. If you have a lab appointment with the [facility] please come in thru the  Main Entrance and check in at the main information desk  You need to re-schedule your appointment should you arrive 10 or more minutes late.  We strive to give you quality time with our providers, and arriving late affects you and other patients whose appointments are after yours.  Also, if you no show three or more times for appointments you may be dismissed from the clinic at the providers discretion.     Again, thank you for  choosing [facility].  Our hope is that these requests will decrease the amount of time that you wait before being seen by our physicians.       _____________________________________________________________  Should you have questions after your visit to [facility], please contact our office at [phone] between the hours of 8:30 a.m. and 4:30 p.m.  Voicemails left after 4:30 p.m. will not be returned until the following business day.  For prescription refill requests, have your pharmacy contact our office.         Resources For Cancer Patients and their Caregivers ? American Cancer Society: Can assist with transportation, wigs, general needs, runs Look Good Feel Better.        906-243-2137 ? Cancer Care: Provides financial assistance, online support groups, medication/co-pay assistance.  1-800-813-HOPE (828)187-4935) ? [facility] Assists [place] cancer patients and their families through emotional , educational and financial support.  805-335-6989 ? [place] DSS Where to apply for food stamps, Medicaid and utility assistance. 7064265188 ? [facility]: Transportation to medical appointments. 940 449 7130 ? Social Security Administration: May apply for disability if have a Stage IV cancer. 574-169-6493 830-394-5624 ? [employer], Disability and Transit Services: Assists with nutrition, care and transit needs. [place] Support Programs: @10RELATIVEDAYS @ > Cancer Support Group  2nd Tuesday of the month 1pm-2pm, Journey Room  > Creative Journey  3rd Tuesday of the month 1130am-1pm, Journey Room  > Look Good Feel Better  1st Wednesday of the month 10am-12 noon, Journey Room (Call American Cancer  Society to register (845)558-7896)

## 2015-08-15 NOTE — Progress Notes (Addendum)
[name], MD [address], [address] [place] [address]  Leukemia, lymphocytic, chronic [facility]) - Plan: CBC with Differential, Comprehensive metabolic panel, Lactate dehydrogenase, Magnesium, Phosphorus, Uric acid, Comprehensive metabolic panel, Lactate dehydrogenase, Uric acid, Magnesium, Phosphorus, EKG 12-Lead, CT Chest W Contrast, CT Abdomen Pelvis W Contrast  URI (upper respiratory infection)  CURRENT THERAPY: Observation  INTERVAL HISTORY: [name] 70 y.o. 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 AND History of Hodgkin's Lymphoma AND Hypogammaglobulenemia AND CKD, Stage IIIA     Hodgkin's disease ([place])   10/02/2007 Initial Diagnosis HODGKIN'S DISEASE    Leukemia, lymphocytic, chronic ([place])   07/22/2015 Procedure IR bone marrow aspiration and biopsy   07/23/2015 Pathology Results Bone Marrow, Aspirate,Biopsy, and Clot, left iliac crest - EXTENSIVE INVOLVEMENT BY CHRONIC LYMPHOCYTIC LEUKEMIA. PERIPHERAL BLOOD: - CHRONIC LYMPHOCYTIC LEUKEMIA. - NORMOCYTIC ANEMIA. - THROMBOCYTOPENIA.   07/23/2015 Pathology Results Bone Marrow Flow Cytometry - CHRONIC LYMPHOCYTIC LEUKEMIA    I personally reviewed and went over laboratory results with the patient.  The results are noted within this dictation.  I personally reviewed and went over pathology results with the patient.  Bone marrow aspiration biopsy demonstrates extensive involvement of CLL.  With this information and his progressive cytopenias, we discussed next step interventions including the initiation of by mouth treatment with Imbruvica. I reviewed the risks, benefits, alternatives, and side effects of this intervention including, but not limited to, full edema, hypertension, fatigue, dizziness, rash, hyperuricemia, diarrhea, nausea, constipation, abdominal pain, vomiting, decreased  appetite, stomatitis, thrombocytopenia, neutropenia, bruising, anemia, hemorrhage, infection, as he'll still pending, arthralgias, dry eye syndrome, upper respiratory tract infection, dyspnea, cough, sinusitis, epistaxis, pneumonia, fever, and death.  While on this treatment, his blood counts be monitored closely along with his renal and hepatic function. Uric acid levels will be checked periodically.  He has signs and symptoms of a URI.  He admits to coughing yellow sputum.  His voice is hoarse.  He denies any fevers.  He denies any nasal discharge.  He is using OTC medications.     Review of Systems  Constitutional: Positive for malaise/fatigue. Negative for fever and chills.  HENT: Negative.  Negative for congestion and sore throat.   Eyes: Negative.   Respiratory: Positive for cough and sputum production.   Cardiovascular: Negative.  Negative for chest pain.  Gastrointestinal: Negative.   Genitourinary: Negative.   Musculoskeletal: Negative.   Skin: Negative.   Neurological: Negative.  Negative for weakness and headaches.  Endo/Heme/Allergies: Negative.   Psychiatric/Behavioral: Negative.     Past Medical History  Diagnosis Date  . Aortic stenosis   . Atrial fibrillation ([place])   . Coronary atherosclerosis of native coronary artery   . Hyperlipidemia   . Essential hypertension, benign   . BPH (benign prostatic hypertrophy)   . Arthritis   . Acid reflux   . Hypogammaglobulinemia ([place])   . Pre-diabetes 08/04/2011  . CLL (chronic lymphoblastic leukemia)   . Hodgkin's disease   . Kidney stones   . Wears glasses   . Wears dentures   . HOH (hard of hearing)   . Indirect inguinal hernia 04/13/2013    Past Surgical History  Procedure Laterality Date  . Aortic valve replacement  10/09    43m Magna Pericardial   . Esophagogastroduodenoscopy      scrapping of throat and stretching  . Lithotripsy    .  Bone marrow aspiration    . Portacath placement    . Coronary artery bypass  graft  10/09    SVG to RCA  . Colon surgery    . Dental surgery  08/2012  . Multiple tooth extractions    . Mass excision N/A 12/05/2012    Procedure: EXCISION MIDLINE NECK MASS;  Surgeon: Ascencion Dike, MD;  Location: [facility];  Service: ENT;  Laterality: N/A;  . Tongue biopsy  12/2012  . Cataract extraction w/phaco Right 01/07/2014    Procedure: CATARACT EXTRACTION PHACO AND INTRAOCULAR LENS PLACEMENT (IOC);  Surgeon: Tonny Branch, MD;  Location: [facility];  Service: Ophthalmology;  Laterality: Right;  CDE:15.31  . Eye surgery Right 02/07/2014    cataract  . Cataract extraction w/phaco Left 03/25/2014    Procedure: CATARACT EXTRACTION PHACO AND INTRAOCULAR LENS PLACEMENT (IOC);  Surgeon: Tonny Branch, MD;  Location: [facility];  Service: Ophthalmology;  Laterality: Left;  CDE 6.55    Family History  Problem Relation Age of Onset  . Heart failure Mother   . Blindness Mother   . Cancer Father 5    Lung   . Cancer Brother 15    acute leukemia     Social History   Social History  . Marital Status: Widowed    Spouse Name: N/A  . Number of Children: N/A  . Years of Education: N/A   Social History Main Topics  . Smoking status: Never Smoker   . Smokeless tobacco: Never Used     Comment: pt denies tobacco use   . Alcohol Use: No     Comment: pt denies alcohol   . Drug Use: No  . Sexual Activity: Not Currently   Other Topics Concern  . Not on file   Social History Narrative     PHYSICAL EXAMINATION  ECOG PERFORMANCE STATUS: 1 - Symptomatic but completely ambulatory  Filed Vitals:   08/15/15 1107  BP: 108/62  Pulse: 75  Temp: 98.4 F (36.9 C)  Resp: 20    GENERAL:alert, no distress, comfortable, cooperative, obese and smiling, unaccompanied  SKIN: skin color, texture, turgor are normal, no rashes or significant lesions HEAD: Normocephalic, No masses, lesions, tenderness or abnormalities EYES: normal, Conjunctiva are pink and non-injected EARS: External ears  normal OROPHARYNX:lips, buccal mucosa, and tongue normal and mucous membranes are moist  NECK: supple, trachea midline LYMPH:  not examined BREAST:not examined LUNGS: clear to auscultation  HEART: regular rate & rhythm, no murmurs and no gallops ABDOMEN:abdomen soft and normal bowel sounds BACK: Back symmetric, no curvature. EXTREMITIES:less then 2 second capillary refill, no joint deformities, effusion, or inflammation, no skin discoloration, no cyanosis  NEURO: alert & oriented x 3 with fluent speech, no focal motor/sensory deficits, gait normal   LABORATORY DATA: CBC    Component Value Date/Time   WBC 183.7* 08/15/2015 1014   RBC 3.18* 08/15/2015 1014   RBC 3.42* 04/18/2015 1347   HGB 10.5* 08/15/2015 1014   HCT 31.9* 08/15/2015 1014   PLT 110* 08/15/2015 1014   MCV 100.3* 08/15/2015 1014   MCH 33.0 08/15/2015 1014   MCHC 32.9 08/15/2015 1014   RDW 16.7* 08/15/2015 1014   LYMPHSABS 173.5* 08/15/2015 1014   MONOABS 3.1* 08/15/2015 1014   EOSABS 1.9* 08/15/2015 1014   BASOSABS 0.8* 08/15/2015 1014      Chemistry      Component Value Date/Time   NA 140 08/15/2015 1014   K 4.8 08/15/2015 1014   CL 108 08/15/2015  1014   CO2 24 08/15/2015 1014   BUN 18 08/15/2015 1014   CREATININE 1.74* 08/15/2015 1014   CREATININE 1.59* 06/10/2015 1601      Component Value Date/Time   CALCIUM 8.9 08/15/2015 1014   ALKPHOS 126 08/15/2015 1014   AST 19 08/15/2015 1014   ALT 15* 08/15/2015 1014   BILITOT 0.4 08/15/2015 1014        PENDING LABS:   RADIOGRAPHIC STUDIES:  Dg Lumbar Spine Complete  07/17/2015  CLINICAL DATA:  Mid and left-sided low back pain radiating into the left leg at times ; duration of symptoms: 3 weeks. No known injury. EXAM: LUMBAR SPINE - COMPLETE 4+ VIEW COMPARISON:  Coronal and sagittal reconstructed images through the lumbar spine of October 09/15/2013 FINDINGS: The lumbar vertebral bodies are preserved in height. The disc space heights are well  maintained. There is mild facet joint hypertrophy at L4-5 and at L5-S1. There is no spondylolisthesis. The pedicles and transverse processes are intact. IMPRESSION: There is no compression fracture nor significant disc space narrowing. There is facet joint hypertrophy at L4-5 and L5-S1. If the patient's symptoms persist, further evaluation with MRI may be useful Electronically Signed   By: David  Martinique M.D.   On: 07/17/2015 11:03   Ct Biopsy  07/22/2015  INDICATION: History of CLL. Please perform CT-guided bone marrow biopsy for tissue diagnostic purposes. EXAM: CT-GUIDED BONE MARROW BIOPSY AND ASPIRATION MEDICATIONS: None ANESTHESIA/SEDATION: Fentanyl 50 mcg IV; Versed 1.5 mg IV Sedation Time: 10 minutes; The patient was continuously monitored during the procedure by the interventional radiology nurse under my direct supervision. COMPLICATIONS: None immediate. PROCEDURE: Informed consent was obtained from the patient following an explanation of the procedure, risks, benefits and alternatives. The patient understands, agrees and consents for the procedure. All questions were addressed. A time out was performed prior to the initiation of the procedure. The patient was positioned right lateral decubitus and non-contrast localization CT was performed of the pelvis to demonstrate the iliac marrow spaces. The operative site was prepped and draped in the usual sterile fashion. Under sterile conditions and local anesthesia, a 22 gauge spinal needle was utilized for procedural planning. Next, an 11 gauge coaxial bone biopsy needle was advanced into the left iliac marrow space. Needle position was confirmed with CT imaging. Initially, bone marrow aspiration was performed. Next, a bone marrow biopsy was obtained with the 11 gauge outer bone marrow device. Samples were prepared with the cytotechnologist and deemed adequate. The needle was removed intact. Hemostasis was obtained with compression and a dressing was placed.  The patient tolerated the procedure well without immediate post procedural complication. IMPRESSION: Successful CT guided left iliac bone marrow aspiration and core biopsy. Electronically Signed   By: Sandi Mariscal M.D.   On: 07/22/2015 13:12   Dg Hip Unilat W Or W/o Pelvis 2-3 Views Left  07/17/2015  CLINICAL DATA:  Mid and left-sided low back pain occasionally radiating into the left leg for the past 3 weeks without known injury. EXAM: DG HIP (WITH OR WITHOUT PELVIS) 2-3V LEFT COMPARISON:  Left hip series of June 30, 2015 FINDINGS: The bony pelvis appears adequately mineralized. There is no lytic or blastic lesion. No acute or old fracture is observed. AP and lateral views of the left hip reveal very mild symmetric narrowing of the joint space. The articular surfaces of the femoral head and acetabulum remains smoothly rounded. IMPRESSION: There is no acute bony abnormality of the left hip nor significant bony change. Minimal narrowing  of the joint space is noted but is stable and likely reflects mild osteoarthritic change. Electronically Signed   By: David  Martinique M.D.   On: 07/17/2015 11:05     PATHOLOGY:    ASSESSMENT AND PLAN:  Leukemia, lymphocytic, chronic (HCC) CLL, having not required treatment to date.  Now with increasing WBC and declining platelet count.  S/P bone marrow aspiration and biopsy on 07/22/2015 demonstrating diffuse involvement of CLL.  Oncology history is updated.  I personally reviewed and went over pathology results with the patient.  His bone marrow aspiration and biopsy demonstrates extensive involvement of CLL.  As a result of this and progressive CBC abnormalities, he would benefit from treatment.  We discussed options including Ibrutinib and Obinutuzumab + Chlorambucil (both category 1 recommendations).  We discussed the pros and cons of each option including the risks, benefits, alternatives, and side effects.  At this time, Jermall wishes to pursue Ibrutinib as it is  a PO option for him and is well tolerated.  He will need chemotherapy teaching regarding this medication.  We will get baseline EKG today.  EKG shows NSR.  I have ordered baseline CT CAP with contrast within the next week.  Patient has signs and symptoms of URI.  Given his immunocompromised status, I have offered him an antibiotic. At this time, he refuses.  He is advised to call on Monday if he is not improved as he will need an antibiotic.  He is afebrile today with stable vitals.  He will return in ~ 2 weeks for labs: CBC diff, CMET, uric acid, Phosphorus, Magnesium, LDH.  Return in ~ 2 weeks for follow-up to evaluate tolerance to therapy.   AddendumRonalee Belts provided me some paper work from [employer].  There is a section for [place] to fill out.  This is provided to Angie.    ORDERS PLACED FOR THIS ENCOUNTER: Orders Placed This Encounter  Procedures  . CT Chest W Contrast  . CT Abdomen Pelvis W Contrast  . CBC with Differential  . Comprehensive metabolic panel  . Lactate dehydrogenase  . Magnesium  . Phosphorus  . Uric acid  . Comprehensive metabolic panel  . Lactate dehydrogenase  . Uric acid  . Magnesium  . Phosphorus  . EKG 12-Lead    MEDICATIONS PRESCRIBED THIS ENCOUNTER: No orders of the defined types were placed in this encounter.    THERAPY PLAN:  He will undergo chemotherapy teaching for Imbruvica and start treatment accordingly.  All questions were answered. The patient knows to call the clinic with any problems, questions or concerns. We can certainly see the patient much sooner if necessary.  Patient and plan discussed with Dr. Ancil Linsey and she is in agreement with the aforementioned.   This note is electronically signed by: Doy Mince 08/15/2015 5:10 PM

## 2015-08-18 ENCOUNTER — Other Ambulatory Visit (HOSPITAL_COMMUNITY): Payer: Self-pay | Admitting: Oncology

## 2015-08-18 DIAGNOSIS — J069 Acute upper respiratory infection, unspecified: Secondary | ICD-10-CM

## 2015-08-18 MED ORDER — AMOXICILLIN-POT CLAVULANATE 875-125 MG PO TABS: 1.0000 | ORAL_TABLET | Freq: Two times a day (BID) | ORAL | Status: DC

## 2015-08-19 NOTE — Progress Notes (Signed)
Imbruvica teaching done on 08/15/15.

## 2015-08-20 ENCOUNTER — Ambulatory Visit (HOSPITAL_COMMUNITY)
Admission: RE | Admit: 2015-08-20 | Discharge: 2015-08-20 | Disposition: A | Discharge status: Home / Self Care | Payer: Medicare Other | Source: Ambulatory Visit | Attending: Oncology | Admitting: Oncology

## 2015-08-20 DIAGNOSIS — C9110 Chronic lymphocytic leukemia of B-cell type not having achieved remission: Secondary | ICD-10-CM | POA: Diagnosis not present

## 2015-08-20 DIAGNOSIS — R599 Enlarged lymph nodes, unspecified: Secondary | ICD-10-CM | POA: Insufficient documentation

## 2015-08-20 DIAGNOSIS — R161 Splenomegaly, not elsewhere classified: Secondary | ICD-10-CM | POA: Diagnosis not present

## 2015-08-20 DIAGNOSIS — J219 Acute bronchiolitis, unspecified: Secondary | ICD-10-CM | POA: Insufficient documentation

## 2015-08-20 MED ORDER — IOPAMIDOL (ISOVUE-300) INJECTION 61%
80.0000 mL | Freq: Once | INTRAVENOUS | Status: AC | PRN
  Administered 2015-08-20: 80 mL via INTRAVENOUS

## 2015-08-21 ENCOUNTER — Telehealth (HOSPITAL_COMMUNITY): Payer: Self-pay | Admitting: Hematology & Oncology

## 2015-08-21 NOTE — Telephone Encounter (Signed)
PC to [facility] to check status of app. Per jill they needed a copy of pt's ins card.?'d turn around time. She stated she could not give me One. First come first serve! Will fax card

## 2015-08-26 ENCOUNTER — Inpatient Hospital Stay (HOSPITAL_COMMUNITY): Payer: Medicare Other

## 2015-08-29 ENCOUNTER — Ambulatory Visit (INDEPENDENT_AMBULATORY_CARE_PROVIDER_SITE_OTHER): Payer: Medicare Other | Admitting: Cardiology

## 2015-08-29 ENCOUNTER — Encounter (INDEPENDENT_AMBULATORY_CARE_PROVIDER_SITE_OTHER): Payer: Self-pay

## 2015-08-29 ENCOUNTER — Encounter: Payer: Self-pay | Admitting: Cardiology

## 2015-08-29 VITALS — BP 116/70 | HR 75 | Ht 70.0 in | Wt 223.0 lb

## 2015-08-29 DIAGNOSIS — Z954 Presence of other heart-valve replacement: Secondary | ICD-10-CM | POA: Diagnosis not present

## 2015-08-29 DIAGNOSIS — I2510 Atherosclerotic heart disease of native coronary artery without angina pectoris: Secondary | ICD-10-CM | POA: Diagnosis not present

## 2015-08-29 DIAGNOSIS — I10 Essential (primary) hypertension: Secondary | ICD-10-CM

## 2015-08-29 DIAGNOSIS — Z953 Presence of xenogenic heart valve: Secondary | ICD-10-CM

## 2015-08-29 NOTE — Progress Notes (Signed)
Cardiology Office Note  Date: 08/29/2015   ID: [name], [name] Aug 12, 1945, MRN [id number]  PCP: [name] Nakayama, MD  Primary Cardiologist: [name] Lesches, MD   Chief Complaint  Patient presents with  . Status post AVR    History of Present Illness: [name] is a 70 y.o. male last seen in October 2016.He has had no major change from a cardiac perspective, reports chronic stable dyspnea on exertion, no exertional chest pain or palpitations.  He continues to follow in the oncology clinic for management of CLL. It sounds like by the recent notes that further treatment options are being considered due to progression.  I reviewed his medications. Cardiac regimen includes aspirin, Lopressor, omega-3 supplement, and Zocor. His most recent echocardiogram was in 2015 as outlined below.  Past Medical History  Diagnosis Date  . Aortic stenosis   . Atrial fibrillation ([place])   . Coronary atherosclerosis of native coronary artery   . Hyperlipidemia   . Essential hypertension, benign   . BPH (benign prostatic hypertrophy)   . Arthritis   . Acid reflux   . Hypogammaglobulinemia ([place])   . Pre-diabetes 08/04/2011  . CLL (chronic lymphoblastic leukemia)   . Hodgkin's disease   . Kidney stones   . Wears glasses   . Wears dentures   . HOH (hard of hearing)   . Indirect inguinal hernia 04/13/2013    Current Outpatient Prescriptions  Medication Sig Dispense Refill  . acetaminophen (TYLENOL) 500 MG tablet Take 1,000 mg by mouth every 6 (six) hours as needed for fever.     Marland Kitchen acyclovir (ZOVIRAX) 200 MG capsule TAKE ONE CAPSULE BY MOUTH TWICE DAILY 60 capsule 5  . aspirin EC 81 MG tablet Take 81 mg by mouth daily.    . folic acid (FOLVITE) 1 MG tablet TAKE ONE TABLET BY MOUTH ONCE DAILY 30 tablet 5  . ibrutinib (IMBRUVICA) 140 MG capsul Take 3 capsules (420 mg total) by mouth daily. 90 capsule 0  . metoprolol (LOPRESSOR) 50 MG tablet TAKE ONE TABLET BY MOUTH TWICE DAILY 60 tablet  6  . Multiple Vitamins-Minerals (CENTRUM SILVER ULTRA MENS PO) Take 1 tablet by mouth every morning.     Marland Kitchen OMEGA 3 1000 MG CAPS Take 1,000 mg by mouth daily.     Marland Kitchen omeprazole (PRILOSEC) 20 MG capsule Take 20 mg by mouth every morning.     . simvastatin (ZOCOR) 40 MG tablet Take 1 tablet (40 mg total) by mouth at bedtime. 90 tablet 3  . sulfamethoxazole-trimethoprim (BACTRIM DS,SEPTRA DS) 800-160 MG tablet      No current facility-administered medications for this visit.   Allergies:  Azithromycin   Social History: The patient  reports that he has never smoked. He has never used smokeless tobacco. He reports that he does not drink alcohol or use illicit drugs.   ROS:  Please see the history of present illness. Otherwise, complete review of systems is positive for chronic fatigue.  All other systems are reviewed and negative.   Physical Exam: VS:  BP 116/70 mmHg  Pulse 75  Ht 5\' 10"  (1.778 m)  Wt 223 lb (101.152 kg)  BMI 32.00 kg/m2  SpO2 98%, BMI Body mass index is 32 kg/(m^2).  Wt Readings from Last 3 Encounters:  08/29/15 223 lb (101.152 kg)  08/15/15 226 lb (102.513 kg)  07/22/15 232 lb (105.235 kg)    Chronically ill-appearing, no distress.  HEENT: Conjunctiva and lids normal, oropharynx with poor dentition.  Neck:  Supple, no elevated jugular venous pressure or bruits.  Lungs: Clear to auscultation, nonlabored.  Cardiac: Regular rate and rhythm, S4, 2/6 systolic murmur at the base, no S3.  Abdomen: Obese, nontender, bowel sounds present.  Extremities: Venous stasis noted, 1-2+ edema, distal pulses diminished.   ECG: I personally reviewed the tracing from 08/15/2015 which showed sinus rhythm with IVCD of left bundle block type.  Recent Labwork: 06/10/2015: TSH 2.38 08/15/2015: ALT 15*; AST 19; BUN 18; Creatinine, Ser 1.74*; Hemoglobin 10.5*; Magnesium 2.2; Platelets 110*; Potassium 4.8; Sodium 140     Component Value Date/Time   CHOL 200 06/10/2015 1601   TRIG 190*  06/10/2015 1601   HDL 16* 06/10/2015 1601   CHOLHDL 12.5* 06/10/2015 1601   VLDL 38* 06/10/2015 1601   LDLCALC 146* 06/10/2015 1601    Other Studies Reviewed Today:  Echocardiogram 01/24/2014: Study Conclusions  - Left ventricle: The cavity size was normal. Wall thickness was increased increased in a pattern of mild to moderate LVH. Systolic function was normal. The estimated ejection fraction was in the range of 60% to 65%. Diastolic function is abnormal, indeterminate grade. - Aortic valve: A 23 mm Edwards Magna pericardial valve is in the AV position. Moderately calcified annulus. Mildly thickened leaflets. There was mild regurgitation. Mean gradient (S): 9 mm Hg. VTI ratio of LVOT to aortic valve: 0.48. Valve area (VTI): 2 cm^2. Valve area (Vmax): 1.85 cm^2. Valve area (Vmean): 2.04 cm^2. - Mitral valve: Moderately to severely calcified annulus. Moderately thickened leaflets . The findings are consistent with severe stenosis by mean pressure gradient. There was mild regurgitation. Mean gradient (D): 12 mm Hg. Cannot distinguish PHT from available spectral Doppler tracings, unable to calculate MVA by PHT. The presence of AI prohibits accurate calculation of MVA by continuity equation. Morphologically there appears to be severe mitral stenosis. - Left atrium: The atrium was severely dilated. - Right ventricle: The cavity size was mildly dilated. - Right atrium: The atrium was moderately dilated. - Pulmonary arteries: Systolic pressure was moderately increased. PA peak pressure: 43 mm Hg (S). - Technically difficult study.  Assessment and Plan:  1. Aortic stenosis status post bioprosthetic AVR 2009. Stable by echocardiogram from 2015. No significant change on examination. Continue observation.  2. CAD status post SVG to RCA at the time of valve surgery. No active angina symptoms. He continues on aspirin and statin.  3. Essential  hypertension, blood pressure is well controlled today. No changes were made.  Current medicines were reviewed with the patient today.  Disposition: FU with me in 6 months.   Signed, Satira Sark, MD, [facility] 08/29/2015 4:14 PM    [facility] at [facility] [address] [place], [place] [address] Phone: [phone]; Fax: [phone]

## 2015-08-29 NOTE — Patient Instructions (Signed)
Your physician wants you to follow-up in: 6 months You will receive a reminder letter in the mail two months in advance. If you don't receive a letter, please call our office to schedule the follow-up appointment.    Your physician recommends that you continue on your current medications as directed. Please refer to the Current Medication list given to you today.    If you need a refill on your cardiac medications before your next appointment, please call your pharmacy.     Thank you for choosing [facility] !        

## 2015-09-01 ENCOUNTER — Encounter (HOSPITAL_COMMUNITY): Payer: Self-pay | Admitting: Oncology

## 2015-09-01 ENCOUNTER — Encounter (HOSPITAL_COMMUNITY): Payer: Medicare Other

## 2015-09-01 ENCOUNTER — Encounter (HOSPITAL_COMMUNITY): Payer: Medicare Other | Attending: Oncology | Admitting: Oncology

## 2015-09-01 VITALS — BP 127/60 | HR 72 | Temp 97.4°F | Resp 18 | Wt 226.4 lb

## 2015-09-01 DIAGNOSIS — C8190 Hodgkin lymphoma, unspecified, unspecified site: Secondary | ICD-10-CM | POA: Insufficient documentation

## 2015-09-01 DIAGNOSIS — Z23 Encounter for immunization: Secondary | ICD-10-CM | POA: Diagnosis not present

## 2015-09-01 DIAGNOSIS — N183 Chronic kidney disease, stage 3 (moderate): Secondary | ICD-10-CM | POA: Diagnosis not present

## 2015-09-01 DIAGNOSIS — D801 Nonfamilial hypogammaglobulinemia: Secondary | ICD-10-CM | POA: Diagnosis not present

## 2015-09-01 DIAGNOSIS — C9110 Chronic lymphocytic leukemia of B-cell type not having achieved remission: Secondary | ICD-10-CM

## 2015-09-01 DIAGNOSIS — J069 Acute upper respiratory infection, unspecified: Secondary | ICD-10-CM | POA: Diagnosis not present

## 2015-09-01 DIAGNOSIS — Z8571 Personal history of Hodgkin lymphoma: Secondary | ICD-10-CM

## 2015-09-01 LAB — CBC WITH DIFFERENTIAL/PLATELET
Basophils Absolute: 0.6 10*3/uL — ABNORMAL HIGH (ref 0.0–0.1)
Basophils Relative: 0 %
EOS PCT: 1 %
Eosinophils Absolute: 1.4 10*3/uL — ABNORMAL HIGH (ref 0.0–0.7)
HEMATOCRIT: 30 % — AB (ref 39.0–52.0)
Hemoglobin: 9.7 g/dL — ABNORMAL LOW (ref 13.0–17.0)
LYMPHS PCT: 93 %
Lymphs Abs: 144.4 10*3/uL — ABNORMAL HIGH (ref 0.7–4.0)
MCH: 33 pg (ref 26.0–34.0)
MCHC: 32.3 g/dL (ref 30.0–36.0)
MCV: 102 fL — AB (ref 78.0–100.0)
Monocytes Absolute: 3 10*3/uL — ABNORMAL HIGH (ref 0.1–1.0)
Monocytes Relative: 2 %
Neutro Abs: 5.4 10*3/uL (ref 1.7–7.7)
Neutrophils Relative %: 4 %
Platelets: 92 10*3/uL — ABNORMAL LOW (ref 150–400)
RBC: 2.94 MIL/uL — ABNORMAL LOW (ref 4.22–5.81)
RDW: 16.8 % — AB (ref 11.5–15.5)
WBC: 154.8 10*3/uL — AB (ref 4.0–10.5)

## 2015-09-01 NOTE — Progress Notes (Signed)
[name], MD [address], [address] [place] [address]  Leukemia, lymphocytic, chronic [facility]) - Plan: CBC with Differential, Hepatitis B core antibody, IgM, Hepatitis B surface antigen, CBC with Differential, Hepatitis B surface antigen, Hepatitis B core antibody, IgM, CBC with Differential, Comprehensive metabolic panel, Magnesium, Sedimentation rate, Lactate dehydrogenase, Uric acid, Phosphorus, CBC with Differential, Comprehensive metabolic panel, Phosphorus, Uric acid, CBC with Differential, Comprehensive metabolic panel, Phosphorus, Uric acid, CANCELED: Hepatitis B core antibody, IgM, CANCELED: Hepatitis B core antibody, IgM  CURRENT THERAPY:  Planning for therapy.  INTERVAL HISTORY: [name] 70 y.o. 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 AND History of Hodgkin's Lymphoma, treated in 2006. AND Hypogammaglobulenemia AND CKD, Stage IIIA   I personally reviewed and went over laboratory results with the patient.  The results are noted within this dictation.  We will update labs today.  We learned that Imbruvica therapy will be cost prohibitive. He was provided allotment of money to be utilized for assistance with copayment. Fortunately, this money will run out at approximately his second month of treatment. This will be problematic as [name] is a lifetime treatment. His monthly copayment was approximately $3000-$5000.  Long-term, this is not a viable treatment option.  As result, we investigated and other treatment options. Other options include Obinotuzumab + chlorambucil and Bendamustine + Rituxan.  Given his past treatment and recent clinical experience with prolonged neutropenia, we've opted to offer him the latter option. We discussed the risks, benefits, alternatives, and side effects of bendamustine and Rituxan chemotherapy.  He  is familiar with Rituxan with his past treatment in 2006.  He does have a port in place. I reviewed this chemotherapy regimen with the patient. He will get Rituxan on day 0 at 375 mg/m followed by bendamustine at 70 mg/m on day 1 and 2 for cycle 1. Subsequent cycles will consist of Rituxan 500 mg/m on day 1 and bendamustine at 90 mg/m on day 1 and 2 with 6 cycles of chemotherapy being administered total.  He is agreeable to this option. He will need chemotherapy teaching. We will get hepatitis B testing completed today in preparation Rituxan therapy.  He notes that his upper respiratory tract congestion is improved. He sounds better. He notes that he's been taking Tylenol Sinus with improvement. He notes continued green sputum production intermittently and therefore offered him another course of antibiotics. He's declined at this point in time. He denies any fevers at home. He denies any shaking chills. He notes that he otherwise feels at baseline.  He reports that he went to [place] to visit with Dr. Tressie Stalker a few months back and share with him pictures of his recently born grandson. [name] didn't tell me whether he was able to get in touch with Dr. Tressie Stalker to share these pictures, but he did tell me that he was offered to transfer his oncology care to their [place] by front desk staffing. The patient said that he was pleased with his current care at [facility] and he will continue his care here as planned.    Review of Systems  Constitutional: Positive for malaise/fatigue (Chronic). Negative for fever, chills and weight loss.  HENT: Negative.  Negative for congestion, ear pain and sore throat.   Eyes: Negative.  Negative for pain.  Respiratory: Positive for sputum production (Intermittent). Negative  for cough, shortness of breath and wheezing.   Cardiovascular: Negative.  Negative for chest pain.  Gastrointestinal: Negative.   Genitourinary:  Negative.   Musculoskeletal: Negative.   Skin: Negative.   Neurological: Negative.  Negative for headaches.  Endo/Heme/Allergies: Negative.   Psychiatric/Behavioral: Negative.     Past Medical History  Diagnosis Date  . Aortic stenosis   . Atrial fibrillation ([place])   . Coronary atherosclerosis of native coronary artery   . Hyperlipidemia   . Essential hypertension, benign   . BPH (benign prostatic hypertrophy)   . Arthritis   . Acid reflux   . Hypogammaglobulinemia ([place])   . Pre-diabetes 08/04/2011  . CLL (chronic lymphoblastic leukemia)   . Hodgkin's disease   . Kidney stones   . Wears glasses   . Wears dentures   . HOH (hard of hearing)   . Indirect inguinal hernia 04/13/2013    Past Surgical History  Procedure Laterality Date  . Aortic valve replacement  10/09    69m Magna Pericardial   . Esophagogastroduodenoscopy      scrapping of throat and stretching  . Lithotripsy    . Bone marrow aspiration    . Portacath placement    . 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 Colon surgery    . Dental surgery  08/2012  . Multiple tooth extractions    . Mass excision N/A 12/05/2012    Procedure: EXCISION MIDLINE NECK MASS;  Surgeon: SAscencion Dike MD;  [facility]  Service: ENT;  Laterality: N/A;  . Tongue biopsy  12/2012  . Cataract extraction w/phaco Right 01/07/2014    Procedure: CATARACT EXTRACTION PHACO AND INTRAOCULAR LENS PLACEMENT (IOC);  Surgeon: KTonny Branch MD;  Location: [facility];  Service: Ophthalmology;  Laterality: Right;  CDE:15.31  . Eye surgery Right 02/07/2014    cataract  . Cataract extraction w/phaco Left 03/25/2014    Procedure: CATARACT EXTRACTION PHACO AND INTRAOCULAR LENS PLACEMENT (IOC);  Surgeon: KTonny Branch MD;  Location: [facility];  Service: Ophthalmology;  Laterality: Left;  CDE 6.55    Family History  Problem Relation Age of Onset  . Heart failure Mother   . Blindness Mother   . Cancer Father 592   Lung   .  Cancer Brother 792   acute leukemia     Social History   Social History  . Marital Status: Widowed    Spouse Name: N/A  . Number of Children: N/A  . Years of Education: N/A   Social History Main Topics  . Smoking status: Never Smoker   . Smokeless tobacco: Never Used     Comment: pt denies tobacco use   . Alcohol Use: No     Comment: pt denies alcohol   . Drug Use: No  . Sexual Activity: Not Currently   Other Topics Concern  . None   Social History Narrative     PHYSICAL EXAMINATION  ECOG PERFORMANCE STATUS: 1 - Symptomatic but completely ambulatory  Filed Vitals:   09/01/15 1350  BP: 127/60  Pulse: 72  Temp: 97.4 F (36.3 C)  Resp: 18    GENERAL:alert, well nourished, well developed, comfortable, cooperative, obese, smiling and malodorous, unaccompanied. SKIN: skin color, texture, turgor are normal, no rashes or significant lesions HEAD: Normocephalic, No masses, lesions, tenderness or abnormalities EYES: normal, EOMI, Conjunctiva are pink and non-injected EARS: External ears normal OROPHARYNX:lips, buccal mucosa, and tongue normal, mucous membranes are moist and  poor dentition  NECK: supple, trachea midline LYMPH:  not examined BREAST:not examined LUNGS: not examined HEART: not examined ABDOMEN:obese BACK: Back symmetric, no curvature. EXTREMITIES:less then 2 second capillary refill, no skin discoloration, no cyanosis  NEURO: alert & oriented x 3 with fluent speech, no focal motor/sensory deficits, gait normal    LABORATORY DATA: CBC    Component Value Date/Time   WBC 183.7* 08/15/2015 1014   RBC 3.18* 08/15/2015 1014   RBC 3.42* 04/18/2015 1347   HGB 10.5* 08/15/2015 1014   HCT 31.9* 08/15/2015 1014   PLT 110* 08/15/2015 1014   MCV 100.3* 08/15/2015 1014   MCH 33.0 08/15/2015 1014   MCHC 32.9 08/15/2015 1014   RDW 16.7* 08/15/2015 1014   LYMPHSABS 173.5* 08/15/2015 1014   MONOABS 3.1* 08/15/2015 1014   EOSABS 1.9* 08/15/2015 1014    BASOSABS 0.8* 08/15/2015 1014      Chemistry      Component Value Date/Time   NA 140 08/15/2015 1014   K 4.8 08/15/2015 1014   CL 108 08/15/2015 1014   CO2 24 08/15/2015 1014   BUN 18 08/15/2015 1014   CREATININE 1.74* 08/15/2015 1014   CREATININE 1.59* 06/10/2015 1601      Component Value Date/Time   CALCIUM 8.9 08/15/2015 1014   ALKPHOS 126 08/15/2015 1014   AST 19 08/15/2015 1014   ALT 15* 08/15/2015 1014   BILITOT 0.4 08/15/2015 1014     Lab Results  Component Value Date   LABURIC 9.7* 08/15/2015     PENDING LABS:   RADIOGRAPHIC STUDIES:  Ct Chest W Contrast  08/20/2015  CLINICAL DATA:  New diagnosis of chronic lymphocytic leukemia. Baseline scan EXAM: CT CHEST, ABDOMEN, AND PELVIS WITH CONTRAST TECHNIQUE: Multidetector CT imaging of the chest, abdomen and pelvis was performed following the standard protocol during bolus administration of intravenous contrast. CONTRAST:  39m ISOVUE-300 IOPAMIDOL (ISOVUE-300) INJECTION 61% COMPARISON:  CT 01/21/2014 FINDINGS: CT CHEST FINDINGS Mediastinum/Nodes: No axillary or supraclavicular adenopathy. Enlarged paratracheal lymph nodes. Subcarinal lymph node measures 2.3 cm (image 31, series 2). RIGHT lower paratracheal lymph node measures 1.5 cm. Mild hilar adenopathy noted. RIGHT hilar lymph node measures 12 mm. Lungs/Pleura: Mild peribronchial thickening. No discrete nodularity. Musculoskeletal: No aggressive osseous lesion. CT ABDOMEN AND PELVIS FINDINGS Hepatobiliary: No focal hepatic lesion. No biliary ductal dilatation. Gallbladder is normal. Common bile duct is normal. Pancreas: Pancreas is normal. No ductal dilatation. No pancreatic inflammation. Spleen: Spleen is enlarged with a calculated volume estimation of 1500 cubic cm. Adrenals/urinary tract: Adrenal glands and kidneys are normal. The ureters and bladder normal. Stomach/Bowel: Stomach, small bowel, appendix, and cecum are normal. The colon and rectosigmoid colon are normal.  Vascular/Lymphatic: Mild calcification aorta. Prompt periportal lymph nodes are upper limits of normal normal in at 12 - 13 mm. There is mild retroperitoneal adenopathy. Example 15 mm lymph node positioned between the aorta and IVC on image 73, series 2. No significant iliac or inguinal lymphadenopathy Reproductive: Prostate normal Other: No free fluid. Musculoskeletal: No aggressive osseous lesion. There is a compression deformity superior endplate of the L5 vertebral body which is new from 01/21/2014 IMPRESSION: Chest Impression: 1. Mediastinal and hilar lymphadenopathy consistent with CLL. 2. No pulmonary lesions.  Mild bronchiolitis. Abdomen / Pelvis Impression: 1. Marked splenomegaly. 2. Mild upper abdominal retroperitoneal adenopathy. Electronically Signed   By: SSuzy BouchardM.D.   On: 08/20/2015 15:00   Ct Abdomen Pelvis W Contrast  08/20/2015  CLINICAL DATA:  New diagnosis of chronic lymphocytic leukemia. Baseline  scan EXAM: CT CHEST, ABDOMEN, AND PELVIS WITH CONTRAST TECHNIQUE: Multidetector CT imaging of the chest, abdomen and pelvis was performed following the standard protocol during bolus administration of intravenous contrast. CONTRAST:  26m ISOVUE-300 IOPAMIDOL (ISOVUE-300) INJECTION 61% COMPARISON:  CT 01/21/2014 FINDINGS: CT CHEST FINDINGS Mediastinum/Nodes: No axillary or supraclavicular adenopathy. Enlarged paratracheal lymph nodes. Subcarinal lymph node measures 2.3 cm (image 31, series 2). RIGHT lower paratracheal lymph node measures 1.5 cm. Mild hilar adenopathy noted. RIGHT hilar lymph node measures 12 mm. Lungs/Pleura: Mild peribronchial thickening. No discrete nodularity. Musculoskeletal: No aggressive osseous lesion. CT ABDOMEN AND PELVIS FINDINGS Hepatobiliary: No focal hepatic lesion. No biliary ductal dilatation. Gallbladder is normal. Common bile duct is normal. Pancreas: Pancreas is normal. No ductal dilatation. No pancreatic inflammation. Spleen: Spleen is enlarged with a  calculated volume estimation of 1500 cubic cm. Adrenals/urinary tract: Adrenal glands and kidneys are normal. The ureters and bladder normal. Stomach/Bowel: Stomach, small bowel, appendix, and cecum are normal. The colon and rectosigmoid colon are normal. Vascular/Lymphatic: Mild calcification aorta. Prompt periportal lymph nodes are upper limits of normal normal in at 12 - 13 mm. There is mild retroperitoneal adenopathy. Example 15 mm lymph node positioned between the aorta and IVC on image 73, series 2. No significant iliac or inguinal lymphadenopathy Reproductive: Prostate normal Other: No free fluid. Musculoskeletal: No aggressive osseous lesion. There is a compression deformity superior endplate of the L5 vertebral body which is new from 01/21/2014 IMPRESSION: Chest Impression: 1. Mediastinal and hilar lymphadenopathy consistent with CLL. 2. No pulmonary lesions.  Mild bronchiolitis. Abdomen / Pelvis Impression: 1. Marked splenomegaly. 2. Mild upper abdominal retroperitoneal adenopathy. Electronically Signed   By: SSuzy BouchardM.D.   On: 08/20/2015 15:00     PATHOLOGY:    ASSESSMENT AND PLAN:  Leukemia, lymphocytic, chronic (HCC) CLL, having not required treatment to date.  Now with increasing WBC and declining platelet count.  S/P bone marrow aspiration and biopsy on 07/22/2015 demonstrating diffuse involvement of CLL.  Oncology history is updated.  We previously discussed treatment options and [name] for IKate Sablewhich is an oral agent.  Unfortunately, there are no significant, long-term funds available to assist him with co-pay and therefore, long-term financial viability of this treatment option is significantly prohibitive.  As a result we discussed other treatment options today:  Obinutuzumab + Chlorambucil  Bendamustine + Rituxan  Both of these options are available to the patient. I did express my concern with Obinutuzumab-based therapy given his past treatment and clinical  experience with significantly prolonged neutropenia.  He is not a beacon of excellent health to begin with and therefore neutropenia-induced complications are a major concern with MLegrand [name]  He has opted for 6 cycles of Bendamustine/Rituxan.  We discussed the risks, benefits, alternatives, and side effects of treatment including, but limited to, nausea/vomiting, infusion reaction, diarrhea, constipation, decreased blood counts, increased risk of infection, and death.   He has decided to pursue Bendamustine and Rituxan treatment:  Benadmustine: 70 mg/m2 day 1 and 2 for cycle 1 with dose escalation to 90 mg/m2 subsequently x 6 cycles total.  Rituxan 375 mg/m2 Day 0, then 500 mg/m2 Day 1 of each cycle every 28 days x 6 cycles total.  Will add Rasbuicase to his treatment plan for Day 0, cycle 1 of Rituxan therapy.  He already has a port in place from the past that is functional.  He will need labs today: CBC diff, Hepatitis B testing.  Labs day 1 of each cycle: CBC diff,  CMET, LDH, ESR, Magnesium, Phosphorus, Uric Acid.  Labs in 1 week post treatment of cycle 1: CBC diff, CMET, Phosphorus, Uric Acid  Depending on labs 1 week post cycle 1, he may need another dose of Rasburicase to reduce the risk of tumor lysis syndrome.  Labs 2 weeks post treatment of cycle 1: CBC diff, CMET, Phosphorus, Uric Acid  Following our last encounter, when I suspect [name] had a URI, he called requesting an antibiotic, which he declined at our appointment time.  An Rx was escribed for this.  Given his hypogammaglobulinemia, he is prone to infections that do not clear easily requiring antibiotic use.  Again, another reason I am concerned about Obinutuzumab-based therapy.  Bendamustine + Rituxan treatment/antibody plan built today with accurate dosing.  Rasburicase to be started with treatment as well.  He will be set-up for chemotherapy teaching.  Following this, he will start treatment; I anticipate a start date later  this week or next week.  Return 1-2 weeks post treatment for Nadir check with labs (as mentioned above): CBC diff, CMET, uric acid, phosphorus.  Return 1-2 weeks post treatment #1.  Depending on uric acid/phosphorus, he may need another dose of Rasburicase.  I predict he will need this.    ORDERS PLACED FOR THIS ENCOUNTER: Orders Placed This Encounter  Procedures  . CBC with Differential  . Hepatitis B core antibody, IgM  . Hepatitis B surface antigen  . Hepatitis B core antibody, IgM  . CBC with Differential  . Comprehensive metabolic panel  . Magnesium  . Sedimentation rate  . Lactate dehydrogenase  . Uric acid  . Phosphorus  . CBC with Differential  . Comprehensive metabolic panel  . Phosphorus  . Uric acid  . CBC with Differential  . Comprehensive metabolic panel  . Phosphorus  . Uric acid    MEDICATIONS PRESCRIBED THIS ENCOUNTER: No orders of the defined types were placed in this encounter.    THERAPY PLAN:  Will start Bendamustine/Rituxan next week.  All questions were answered. The patient knows to call the clinic with any problems, questions or concerns. We can certainly see the patient much sooner if necessary.  Patient and plan discussed with Dr. Ancil Linsey and she is in agreement with the aforementioned.   This note is electronically signed by: Doy Mince 09/01/2015 3:36 PM

## 2015-09-01 NOTE — Patient Instructions (Addendum)
[place] at [facility] Discharge Instructions  RECOMMENDATIONS MADE BY THE CONSULTANT AND ANY TEST RESULTS WILL BE SENT TO YOUR REFERRING PHYSICIAN.  Rituxan and Bendamustine for chemo: Rituxan Day 1 & Bendamustine Day 2, 3  For the first cycle and thereafter Rituxan Day 1 Bendamustine Day 1 & 2 every 28 days  This is to start next week  Return in 2 weeks for f/u with labs  Today go to the lab to have labs drawn     Thank you for choosing [place] at [facility] to provide your oncology and hematology care.  To afford each patient quality time with our provider, please arrive at least 15 minutes before your scheduled appointment time.   Beginning January 23rd 2017 lab work for the [facility] will be done in the  Main lab at [facility] on 1st floor. If you have a lab appointment with the [facility] please come in thru the  Main Entrance and check in at the main information desk  You need to re-schedule your appointment should you arrive 10 or more minutes late.  We strive to give you quality time with our providers, and arriving late affects you and other patients whose appointments are after yours.  Also, if you no show three or more times for appointments you may be dismissed from the clinic at the providers discretion.     Again, thank you for choosing [facility].  Our hope is that these requests will decrease the amount of time that you wait before being seen by our physicians.       _____________________________________________________________  Should you have questions after your visit to [facility], please contact our office at [phone] between the hours of 8:30 a.m. and 4:30 p.m.  Voicemails left after 4:30 p.m. will not be returned until the following business day.  For prescription refill requests, have your pharmacy contact our office.         Resources For Cancer Patients and their  Caregivers ? American Cancer Society: Can assist with transportation, wigs, general needs, runs Look Good Feel Better.        313-076-3045 ? Cancer Care: Provides financial assistance, online support groups, medication/co-pay assistance.  1-800-813-HOPE 505-770-3411) ? [place] Assists [place] cancer patients and their families through emotional , educational and financial support.  713-435-7318 ? [place] DSS Where to apply for food stamps, Medicaid and utility assistance. 501-804-1848 ? [facility]: Transportation to medical appointments. 9086603718 ? Social Security Administration: May apply for disability if have a Stage IV cancer. (520) 076-5800 5162781149 ? [employer], Disability and Transit Services: Assists with nutrition, care and transit needs. [place] Support Programs: @10RELATIVEDAYS @ > Cancer Support Group  2nd Tuesday of the month 1pm-2pm, Journey Room  > Creative Journey  3rd Tuesday of the month 1130am-1pm, Journey Room  > Look Good Feel Better  1st Wednesday of the month 10am-12 noon, Journey Room (Call [place] to register [phone])

## 2015-09-01 NOTE — Assessment & Plan Note (Addendum)
CLL, having not required treatment to date.  Now with increasing WBC and declining platelet count.  S/P bone marrow aspiration and biopsy on 07/22/2015 demonstrating diffuse involvement of CLL.  Oncology history is updated.  We previously discussed treatment options and Ahaan opted for Kate Sable which is an oral agent.  Unfortunately, there are no significant, long-term funds available to assist him with co-pay and therefore, long-term financial viability of this treatment option is significantly prohibitive.  As a result we discussed other treatment options today:  Obinutuzumab + Chlorambucil  Bendamustine + Rituxan  Both of these options are available to the patient. I did express my concern with Obinutuzumab-based therapy given his past treatment and clinical experience with significantly prolonged neutropenia.  He is not a beacon of excellent health to begin with and therefore neutropenia-induced complications are a major concern with Legrand Como.  He has opted for 6 cycles of Bendamustine/Rituxan.  We discussed the risks, benefits, alternatives, and side effects of treatment including, but limited to, nausea/vomiting, infusion reaction, diarrhea, constipation, decreased blood counts, increased risk of infection, and death.   He has decided to pursue Bendamustine and Rituxan treatment:  Benadmustine: 70 mg/m2 day 1 and 2 for cycle 1 with dose escalation to 90 mg/m2 subsequently x 6 cycles total.  Rituxan 375 mg/m2 Day 0, then 500 mg/m2 Day 1 of each cycle every 28 days x 6 cycles total.  Will add Rasbuicase to his treatment plan for Day 0, cycle 1 of Rituxan therapy.  He already has a port in place from the past that is functional.  He will need labs today: CBC diff, Hepatitis B testing.  Labs day 1 of each cycle: CBC diff, CMET, LDH, ESR, Magnesium, Phosphorus, Uric Acid.  Labs in 1 week post treatment of cycle 1: CBC diff, CMET, Phosphorus, Uric Acid  Depending on labs 1 week post cycle 1,  he may need another dose of Rasburicase to reduce the risk of tumor lysis syndrome.  Labs 2 weeks post treatment of cycle 1: CBC diff, CMET, Phosphorus, Uric Acid  Following our last encounter, when I suspect [name] had a URI, he called requesting an antibiotic, which he declined at our appointment time.  An Rx was escribed for this.  Given his hypogammaglobulinemia, he is prone to infections that do not clear easily requiring antibiotic use.  Again, another reason I am concerned about Obinutuzumab-based therapy.  Bendamustine + Rituxan treatment/antibody plan built today with accurate dosing.  Rasburicase to be started with treatment as well.  He will be set-up for chemotherapy teaching.  Following this, he will start treatment; I anticipate a start date later this week or next week.  Return 1-2 weeks post treatment for Nadir check with labs (as mentioned above): CBC diff, CMET, uric acid, phosphorus.  Return 1-2 weeks post treatment #1.  Depending on uric acid/phosphorus, he may need another dose of Rasburicase.  I predict he will need this.

## 2015-09-01 NOTE — Progress Notes (Signed)
CRITICAL VALUE ALERT  Critical value received:  WBC 154.8  Date of notification:  09/01/15  Time of notification:  [id number]  Critical value read back:Yes.    Nurse who received alert:  Donavan Foil, RN  MD notified (1st page):  Notified Darcella Gasman, PA  Time of first page:  34  MD notified (2nd page):  Time of second page:  Responding MD:   Time MD responded:

## 2015-09-02 LAB — HEPATITIS B CORE ANTIBODY, IGM: HEP B C IGM: NEGATIVE

## 2015-09-02 LAB — HEPATITIS B SURFACE ANTIGEN: Hepatitis B Surface Ag: NEGATIVE

## 2015-09-03 DIAGNOSIS — D72820 Lymphocytosis (symptomatic): Secondary | ICD-10-CM | POA: Diagnosis not present

## 2015-09-03 LAB — PATHOLOGIST SMEAR REVIEW

## 2015-09-08 ENCOUNTER — Other Ambulatory Visit (HOSPITAL_COMMUNITY): Payer: Self-pay | Admitting: Oncology

## 2015-09-08 ENCOUNTER — Encounter (HOSPITAL_COMMUNITY): Payer: Medicare Other

## 2015-09-08 ENCOUNTER — Encounter (HOSPITAL_BASED_OUTPATIENT_CLINIC_OR_DEPARTMENT_OTHER): Payer: Medicare Other

## 2015-09-08 VITALS — BP 92/50 | HR 80 | Temp 98.9°F | Resp 18 | Wt 222.8 lb

## 2015-09-08 DIAGNOSIS — Z5112 Encounter for antineoplastic immunotherapy: Secondary | ICD-10-CM

## 2015-09-08 DIAGNOSIS — C9110 Chronic lymphocytic leukemia of B-cell type not having achieved remission: Secondary | ICD-10-CM

## 2015-09-08 DIAGNOSIS — C8190 Hodgkin lymphoma, unspecified, unspecified site: Secondary | ICD-10-CM | POA: Diagnosis not present

## 2015-09-08 DIAGNOSIS — Z23 Encounter for immunization: Secondary | ICD-10-CM | POA: Diagnosis not present

## 2015-09-08 LAB — CBC WITH DIFFERENTIAL/PLATELET
BASOS ABS: 0.7 10*3/uL — AB (ref 0.0–0.1)
BASOS PCT: 1 %
EOS ABS: 1.3 10*3/uL — AB (ref 0.0–0.7)
Eosinophils Relative: 1 %
HCT: 36.1 % — ABNORMAL LOW (ref 39.0–52.0)
Hemoglobin: 11.3 g/dL — ABNORMAL LOW (ref 13.0–17.0)
Lymphocytes Relative: 95 %
Lymphs Abs: 144 10*3/uL — ABNORMAL HIGH (ref 0.7–4.0)
MCH: 32.8 pg (ref 26.0–34.0)
MCHC: 31.3 g/dL (ref 30.0–36.0)
MCV: 104.6 fL — ABNORMAL HIGH (ref 78.0–100.0)
MONOS PCT: 1 %
Monocytes Absolute: 2.1 10*3/uL — ABNORMAL HIGH (ref 0.1–1.0)
NEUTROS ABS: 4.2 10*3/uL (ref 1.7–7.7)
NEUTROS PCT: 3 %
Platelets: 78 10*3/uL — ABNORMAL LOW (ref 150–400)
RBC: 3.45 MIL/uL — ABNORMAL LOW (ref 4.22–5.81)
RDW: 17.3 % — ABNORMAL HIGH (ref 11.5–15.5)
WBC: 152.4 10*3/uL — AB (ref 4.0–10.5)

## 2015-09-08 LAB — COMPREHENSIVE METABOLIC PANEL
ALBUMIN: 3.7 g/dL (ref 3.5–5.0)
ALT: 13 U/L — ABNORMAL LOW (ref 17–63)
ANION GAP: 5 (ref 5–15)
AST: 21 U/L (ref 15–41)
Alkaline Phosphatase: 116 U/L (ref 38–126)
BUN: 19 mg/dL (ref 6–20)
CHLORIDE: 109 mmol/L (ref 101–111)
CO2: 26 mmol/L (ref 22–32)
Calcium: 8.4 mg/dL — ABNORMAL LOW (ref 8.9–10.3)
Creatinine, Ser: 2.01 mg/dL — ABNORMAL HIGH (ref 0.61–1.24)
GFR calc Af Amer: 37 mL/min — ABNORMAL LOW (ref >60)
GFR calc non Af Amer: 32 mL/min — ABNORMAL LOW (ref >60)
GLUCOSE: 102 mg/dL — AB (ref 65–99)
POTASSIUM: 4.4 mmol/L (ref 3.5–5.1)
SODIUM: 140 mmol/L (ref 135–145)
Total Bilirubin: 0.5 mg/dL (ref 0.3–1.2)
Total Protein: 5.7 g/dL — ABNORMAL LOW (ref 6.5–8.1)

## 2015-09-08 LAB — SEDIMENTATION RATE: Sed Rate: 5 mm/hr (ref 0–16)

## 2015-09-08 LAB — PHOSPHORUS: PHOSPHORUS: 4.7 mg/dL — AB (ref 2.5–4.6)

## 2015-09-08 LAB — URIC ACID: URIC ACID, SERUM: 11 mg/dL — AB (ref 4.4–7.6)

## 2015-09-08 LAB — MAGNESIUM: Magnesium: 2.2 mg/dL (ref 1.7–2.4)

## 2015-09-08 LAB — LACTATE DEHYDROGENASE: LDH: 160 U/L (ref 98–192)

## 2015-09-08 MED ORDER — HEPARIN SOD (PORK) LOCK FLUSH 100 UNIT/ML IV SOLN
500.0000 [IU] | Freq: Once | INTRAVENOUS | Status: AC | PRN
  Administered 2015-09-08: 500 [IU]
  Filled 2015-09-08 (×2): qty 5

## 2015-09-08 MED ORDER — SODIUM CHLORIDE 0.9 % IV SOLN: Freq: Once | INTRAVENOUS | Status: DC

## 2015-09-08 MED ORDER — HEPARIN SOD (PORK) LOCK FLUSH 100 UNIT/ML IV SOLN: 500.0000 [IU] | Freq: Once | INTRAVENOUS | Status: DC | PRN

## 2015-09-08 MED ORDER — SODIUM CHLORIDE 0.9% FLUSH: 10.0000 mL | INTRAVENOUS | Status: DC | PRN

## 2015-09-08 MED ORDER — SODIUM CHLORIDE 0.9 % IV SOLN
Freq: Once | INTRAVENOUS | Status: AC
  Administered 2015-09-08: 11:00:00 via INTRAVENOUS

## 2015-09-08 MED ORDER — ONDANSETRON HCL 4 MG/2ML IJ SOLN
4.0000 mg | Freq: Once | INTRAMUSCULAR | Status: AC
  Administered 2015-09-08: 4 mg via INTRAVENOUS

## 2015-09-08 MED ORDER — MEPERIDINE HCL 50 MG/ML IJ SOLN
INTRAMUSCULAR | Status: AC
  Filled 2015-09-08: qty 1

## 2015-09-08 MED ORDER — ACETAMINOPHEN 325 MG PO TABS
650.0000 mg | ORAL_TABLET | Freq: Once | ORAL | Status: AC
  Administered 2015-09-08: 650 mg via ORAL
  Filled 2015-09-08: qty 2

## 2015-09-08 MED ORDER — ONDANSETRON HCL 4 MG/2ML IJ SOLN: 4.0000 mg | Freq: Once | INTRAMUSCULAR | Status: DC

## 2015-09-08 MED ORDER — METHYLPREDNISOLONE SODIUM SUCC 40 MG IJ SOLR
INTRAMUSCULAR | Status: AC
  Filled 2015-09-08: qty 1

## 2015-09-08 MED ORDER — DIPHENHYDRAMINE HCL 25 MG PO CAPS
50.0000 mg | ORAL_CAPSULE | Freq: Once | ORAL | Status: AC
  Administered 2015-09-08: 50 mg via ORAL
  Filled 2015-09-08: qty 2

## 2015-09-08 MED ORDER — METHYLPREDNISOLONE SODIUM SUCC 40 MG IJ SOLR
40.0000 mg | Freq: Once | INTRAMUSCULAR | Status: AC
  Administered 2015-09-08: 40 mg via INTRAVENOUS

## 2015-09-08 MED ORDER — SODIUM CHLORIDE 0.9 % IV SOLN
6.0000 mg | Freq: Once | INTRAVENOUS | Status: AC
  Administered 2015-09-08: 6 mg via INTRAVENOUS
  Filled 2015-09-08: qty 4

## 2015-09-08 MED ORDER — ONDANSETRON HCL 40 MG/20ML IJ SOLN: 8.0000 mg | Freq: Once | INTRAMUSCULAR | Status: DC

## 2015-09-08 MED ORDER — METHYLPREDNISOLONE SODIUM SUCC 125 MG IJ SOLR
INTRAMUSCULAR | Status: AC
  Filled 2015-09-08: qty 2

## 2015-09-08 MED ORDER — ONDANSETRON HCL 4 MG/2ML IJ SOLN
INTRAMUSCULAR | Status: AC
  Filled 2015-09-08: qty 4

## 2015-09-08 MED ORDER — FAMOTIDINE IN NACL 20-0.9 MG/50ML-% IV SOLN
INTRAVENOUS | Status: AC
  Filled 2015-09-08: qty 50

## 2015-09-08 MED ORDER — PALONOSETRON HCL INJECTION 0.25 MG/5ML: 0.2500 mg | Freq: Once | INTRAVENOUS | Status: DC

## 2015-09-08 MED ORDER — SODIUM CHLORIDE 0.9 % IV SOLN
10.0000 mg | Freq: Once | INTRAVENOUS | Status: DC
  Filled 2015-09-08: qty 1

## 2015-09-08 MED ORDER — SODIUM CHLORIDE 0.9 % IV SOLN
375.0000 mg/m2 | Freq: Once | INTRAVENOUS | Status: AC
  Administered 2015-09-08: 800 mg via INTRAVENOUS
  Filled 2015-09-08: qty 50

## 2015-09-08 MED ORDER — SODIUM CHLORIDE 0.9 % IV SOLN
70.0000 mg/m2 | Freq: Once | INTRAVENOUS | Status: DC
  Filled 2015-09-08: qty 6

## 2015-09-08 NOTE — Progress Notes (Signed)
CRITICAL VALUE ALERT Critical value received:  WBC 152.4 Date of notification:  09/08/2015 Time of notification:0957 am Critical value read back:  Yes.   Nurse who received alert:  C.Yesica Kemler RN MD notified (1st Evetta Renner):  Kirby Crigler PA C

## 2015-09-08 NOTE — Progress Notes (Signed)
Rituxan infusion completed.  VSS.  A&Ox4, in no distress.  Discharged ambulatory.

## 2015-09-08 NOTE — Patient Instructions (Signed)
[facility] Discharge Instructions for Patients Receiving Chemotherapy   Beginning January 23rd 2017 lab work for the [facility] will be done in the  Main lab at [facility] on 1st floor. If you have a lab appointment with the [facility] please come in thru the  Main Entrance and check in at the main information desk   Today you received the following chemotherapy agent: Rituxan today.     If you develop nausea and vomiting, or diarrhea that is not controlled by your medication, call the clinic.  The clinic phone number is [phone]. Office hours are Monday-Friday 8:30am-5:00pm.  BELOW ARE SYMPTOMS THAT SHOULD BE REPORTED IMMEDIATELY:  *FEVER GREATER THAN 101.0 F  *CHILLS WITH OR WITHOUT FEVER  NAUSEA AND VOMITING THAT IS NOT CONTROLLED WITH YOUR NAUSEA MEDICATION  *UNUSUAL SHORTNESS OF BREATH  *UNUSUAL BRUISING OR BLEEDING  TENDERNESS IN MOUTH AND THROAT WITH OR WITHOUT PRESENCE OF ULCERS  *URINARY PROBLEMS  *BOWEL PROBLEMS  UNUSUAL RASH Items with * indicate a potential emergency and should be followed up as soon as possible. If you have an emergency after office hours please contact your primary care physician or go to the nearest emergency department.  Please call the clinic during office hours if you have any questions or concerns.   You may also contact the Patient Navigator at [phone] should you have any questions or need assistance in obtaining follow up care.      Resources For Cancer Patients and their Caregivers ? American Cancer Society: Can assist with transportation, wigs, general needs, runs Look Good Feel Better.        8281155606 ? Cancer Care: Provides financial assistance, online support groups, medication/co-pay assistance.  1-800-813-HOPE (910) 485-2624) ? [place] Assists [place] cancer patients and their families through emotional , educational and financial support.   (270) 240-7270 ? [place] DSS Where to apply for food stamps, Medicaid and utility assistance. (815)708-8219 ? RCATS: Transportation to medical appointments. (618)847-6682 ? Social Security Administration: May apply for disability if have a Stage IV cancer. [phone] ? [employer], Disability and Transit Services: Assists with nutrition, care and transit needs. [phone]

## 2015-09-08 NOTE — Progress Notes (Signed)
1146- Patient ready for first rate increase.  BP 96/44, patient drowsy, but asymptomatic otherwise.  MD and PA aware and the advise to continue Rituxan and monitor BP.   1210- Patient reports that he feels lightheaded and sick.  BP 42/14.  Rituxan stopped and MD and PA aware of BP.  Normal saline bolus started at 925ml/hr and patient reclines into flat supine position.   BP starts to increase, but patient started to dry heave and was inclined to prevent aspiration.  PA in the room for assessment, Zofran 8mg  given via IVP.  Patient monitored closely with RN and PA in the room. Please see flowsheet for vitals checks.  1250- Patient having chills and shaking slightly, MD and PA made aware.  Solumedrol 40mg  IV ordered and given.  Patient c/o being cold, fingers mildly cyanotic with good capillary refill.  VSS.  Patient supine and NS still running.  Will monitor for additional 30 minutes prior to starting Rituxan.   1320-Rituxan restarted.  VSS. 1341- Rituxan stopped r/t hypotension.  Patient asymptomatic.  PA and MD aware.  Will monitor patient until BP is WNL. 1400-Rituxan restarted at original starting rate.   1630- Patient tolerating infusion well at this time, all dosing increments have been made with VSS.

## 2015-09-09 ENCOUNTER — Other Ambulatory Visit (HOSPITAL_COMMUNITY): Payer: Self-pay | Admitting: Oncology

## 2015-09-09 ENCOUNTER — Encounter (HOSPITAL_BASED_OUTPATIENT_CLINIC_OR_DEPARTMENT_OTHER): Payer: Medicare Other

## 2015-09-09 VITALS — BP 103/60 | HR 88 | Temp 97.6°F | Resp 18

## 2015-09-09 DIAGNOSIS — C9110 Chronic lymphocytic leukemia of B-cell type not having achieved remission: Secondary | ICD-10-CM

## 2015-09-09 DIAGNOSIS — Z5111 Encounter for antineoplastic chemotherapy: Secondary | ICD-10-CM | POA: Diagnosis not present

## 2015-09-09 MED ORDER — SODIUM CHLORIDE 0.9 % IV SOLN
Freq: Once | INTRAVENOUS | Status: AC
  Administered 2015-09-09: 14:00:00 via INTRAVENOUS

## 2015-09-09 MED ORDER — DEXAMETHASONE SODIUM PHOSPHATE 100 MG/10ML IJ SOLN
10.0000 mg | Freq: Once | INTRAMUSCULAR | Status: AC
  Administered 2015-09-09: 10 mg via INTRAVENOUS
  Filled 2015-09-09: qty 1

## 2015-09-09 MED ORDER — BENDAMUSTINE HCL CHEMO INJECTION 100 MG/4ML
70.0000 mg/m2 | Freq: Once | INTRAVENOUS | Status: AC
  Administered 2015-09-09: 150 mg via INTRAVENOUS
  Filled 2015-09-09: qty 6

## 2015-09-09 MED ORDER — PALONOSETRON HCL INJECTION 0.25 MG/5ML
INTRAVENOUS | Status: AC
  Filled 2015-09-09: qty 5

## 2015-09-09 MED ORDER — SODIUM CHLORIDE 0.9% FLUSH: 10.0000 mL | INTRAVENOUS | Status: DC | PRN

## 2015-09-09 MED ORDER — PALONOSETRON HCL INJECTION 0.25 MG/5ML
0.2500 mg | Freq: Once | INTRAVENOUS | Status: AC
  Administered 2015-09-09: 0.25 mg via INTRAVENOUS

## 2015-09-09 MED ORDER — HEPARIN SOD (PORK) LOCK FLUSH 100 UNIT/ML IV SOLN
INTRAVENOUS | Status: AC
  Filled 2015-09-09: qty 5

## 2015-09-09 MED ORDER — HEPARIN SOD (PORK) LOCK FLUSH 100 UNIT/ML IV SOLN
500.0000 [IU] | Freq: Once | INTRAVENOUS | Status: AC | PRN
  Administered 2015-09-09: 500 [IU]

## 2015-09-09 NOTE — Progress Notes (Signed)
MD made aware of BP low today compared to patient's baseline.  She wishes to monitor the BP tomorrow as well and if the patient continues to have a BP below baseline, she will decrease his BP medication while on active treatment.   Patient tolerated infusion well.  VSS.

## 2015-09-09 NOTE — Patient Instructions (Signed)
[facility] Discharge Instructions for Patients Receiving Chemotherapy   Beginning January 23rd 2017 lab work for the [facility] will be done in the  Main lab at [facility] on 1st floor. If you have a lab appointment with the [facility] please come in thru the  Main Entrance and check in at the main information desk   Today you received the following chemotherapy agent: Bendamustine.     If you develop nausea and vomiting, or diarrhea that is not controlled by your medication, call the clinic.  The clinic phone number is [phone]. Office hours are Monday-Friday 8:30am-5:00pm.  BELOW ARE SYMPTOMS THAT SHOULD BE REPORTED IMMEDIATELY:  *FEVER GREATER THAN 101.0 F  *CHILLS WITH OR WITHOUT FEVER  NAUSEA AND VOMITING THAT IS NOT CONTROLLED WITH YOUR NAUSEA MEDICATION  *UNUSUAL SHORTNESS OF BREATH  *UNUSUAL BRUISING OR BLEEDING  TENDERNESS IN MOUTH AND THROAT WITH OR WITHOUT PRESENCE OF ULCERS  *URINARY PROBLEMS  *BOWEL PROBLEMS  UNUSUAL RASH Items with * indicate a potential emergency and should be followed up as soon as possible. If you have an emergency after office hours please contact your primary care physician or go to the nearest emergency department.  Please call the clinic during office hours if you have any questions or concerns.   You may also contact the Patient Navigator at [phone] should you have any questions or need assistance in obtaining follow up care.      Resources For Cancer Patients and their Caregivers ? American Cancer Society: Can assist with transportation, wigs, general needs, runs Look Good Feel Better.        ([phone] [phone] ? Cancer Care: Provides financial assistance, online support groups, medication/co-pay assistance.  1-800-813-HOPE 248-083-2759) ? [place] Assists [place] cancer patients and their families through emotional , educational and financial support.   954-805-0373 ? [place] DSS Where to apply for food stamps, Medicaid and utility assistance. (250)509-3423 ? RCATS: Transportation to medical appointments. (681) 699-6492 ? Social Security Administration: May apply for disability if have a Stage IV cancer. [phone] 503-439-2644 ? [employer], Disability and Transit Services: Assists with nutrition, care and transit needs. [phone]

## 2015-09-10 ENCOUNTER — Other Ambulatory Visit (HOSPITAL_COMMUNITY): Payer: Medicare Other

## 2015-09-10 ENCOUNTER — Other Ambulatory Visit (HOSPITAL_COMMUNITY): Payer: Self-pay | Admitting: Oncology

## 2015-09-10 ENCOUNTER — Encounter (HOSPITAL_BASED_OUTPATIENT_CLINIC_OR_DEPARTMENT_OTHER): Payer: Medicare Other

## 2015-09-10 VITALS — BP 112/45 | HR 67 | Temp 97.5°F | Resp 18 | Wt 228.0 lb

## 2015-09-10 DIAGNOSIS — Z5111 Encounter for antineoplastic chemotherapy: Secondary | ICD-10-CM

## 2015-09-10 DIAGNOSIS — C8190 Hodgkin lymphoma, unspecified, unspecified site: Secondary | ICD-10-CM | POA: Diagnosis not present

## 2015-09-10 DIAGNOSIS — C9110 Chronic lymphocytic leukemia of B-cell type not having achieved remission: Secondary | ICD-10-CM | POA: Diagnosis not present

## 2015-09-10 DIAGNOSIS — Z23 Encounter for immunization: Secondary | ICD-10-CM | POA: Diagnosis not present

## 2015-09-10 LAB — PHOSPHORUS: PHOSPHORUS: 4.9 mg/dL — AB (ref 2.5–4.6)

## 2015-09-10 LAB — CBC WITH DIFFERENTIAL/PLATELET
BAND NEUTROPHILS: 1 %
BASOS ABS: 0 10*3/uL (ref 0.0–0.1)
BASOS PCT: 0 %
BLASTS: 0 %
EOS ABS: 0 10*3/uL (ref 0.0–0.7)
Eosinophils Relative: 0 %
HCT: 27.9 % — ABNORMAL LOW (ref 39.0–52.0)
HEMOGLOBIN: 9.3 g/dL — AB (ref 13.0–17.0)
Lymphocytes Relative: 83 %
Lymphs Abs: 95 10*3/uL — ABNORMAL HIGH (ref 0.7–4.0)
MCH: 33.9 pg (ref 26.0–34.0)
MCHC: 33.3 g/dL (ref 30.0–36.0)
MCV: 101.8 fL — ABNORMAL HIGH (ref 78.0–100.0)
METAMYELOCYTES PCT: 0 %
MYELOCYTES: 0 %
Monocytes Absolute: 11.5 10*3/uL — ABNORMAL HIGH (ref 0.1–1.0)
Monocytes Relative: 10 %
NEUTROS ABS: 8 10*3/uL — AB (ref 1.7–7.7)
Neutrophils Relative %: 6 %
PLATELETS: 55 10*3/uL — AB (ref 150–400)
PROMYELOCYTES ABS: 0 %
RBC: 2.74 MIL/uL — ABNORMAL LOW (ref 4.22–5.81)
RDW: 17.4 % — ABNORMAL HIGH (ref 11.5–15.5)
SMEAR REVIEW: DECREASED
WBC: 114.5 10*3/uL (ref 4.0–10.5)
nRBC: 0 /100 WBC

## 2015-09-10 LAB — COMPREHENSIVE METABOLIC PANEL
ALK PHOS: 107 U/L (ref 38–126)
ALT: 17 U/L (ref 17–63)
AST: 22 U/L (ref 15–41)
Albumin: 3.5 g/dL (ref 3.5–5.0)
Anion gap: 6 (ref 5–15)
BUN: 29 mg/dL — AB (ref 6–20)
CALCIUM: 8 mg/dL — AB (ref 8.9–10.3)
CHLORIDE: 108 mmol/L (ref 101–111)
CO2: 21 mmol/L — AB (ref 22–32)
CREATININE: 2.03 mg/dL — AB (ref 0.61–1.24)
GFR calc non Af Amer: 31 mL/min — ABNORMAL LOW (ref >60)
GFR, EST AFRICAN AMERICAN: 36 mL/min — AB (ref >60)
Glucose, Bld: 137 mg/dL — ABNORMAL HIGH (ref 65–99)
Potassium: 4.8 mmol/L (ref 3.5–5.1)
SODIUM: 135 mmol/L (ref 135–145)
Total Bilirubin: 0.4 mg/dL (ref 0.3–1.2)
Total Protein: 5.4 g/dL — ABNORMAL LOW (ref 6.5–8.1)

## 2015-09-10 LAB — URIC ACID: Uric Acid, Serum: 3.6 mg/dL — ABNORMAL LOW (ref 4.4–7.6)

## 2015-09-10 MED ORDER — HEPARIN SOD (PORK) LOCK FLUSH 100 UNIT/ML IV SOLN
500.0000 [IU] | Freq: Once | INTRAVENOUS | Status: AC | PRN
  Administered 2015-09-10: 500 [IU]

## 2015-09-10 MED ORDER — SODIUM CHLORIDE 0.9% FLUSH: 10.0000 mL | INTRAVENOUS | Status: DC | PRN

## 2015-09-10 MED ORDER — SODIUM CHLORIDE 0.9 % IV SOLN
6.0000 mg | Freq: Once | INTRAVENOUS | Status: AC
  Administered 2015-09-10: 6 mg via INTRAVENOUS
  Filled 2015-09-10: qty 4

## 2015-09-10 MED ORDER — PEGFILGRASTIM 6 MG/0.6ML ~~LOC~~ PSKT: 6.0000 mg | PREFILLED_SYRINGE | Freq: Once | SUBCUTANEOUS | Status: DC

## 2015-09-10 MED ORDER — SODIUM CHLORIDE 0.9 % IV SOLN
Freq: Once | INTRAVENOUS | Status: AC
  Administered 2015-09-10: 10:00:00 via INTRAVENOUS

## 2015-09-10 MED ORDER — SODIUM CHLORIDE 0.9 % IV SOLN
70.0000 mg/m2 | Freq: Once | INTRAVENOUS | Status: AC
  Administered 2015-09-10: 150 mg via INTRAVENOUS
  Filled 2015-09-10: qty 6

## 2015-09-10 MED ORDER — SODIUM CHLORIDE 0.9 % IV SOLN
10.0000 mg | Freq: Once | INTRAVENOUS | Status: AC
  Administered 2015-09-10: 10 mg via INTRAVENOUS
  Filled 2015-09-10: qty 1

## 2015-09-10 NOTE — Progress Notes (Unsigned)
CRITICAL VALUE ALERT  Critical value received:  WBC 114.5  Date of notification:  09/10/15  Time of notification:  1000  Critical value read back:Yes.    Nurse who received alert:  Donavan Foil, RN  MD notified (1st page):  Notified  Dr. Whitney Muse  Time of first page:  1004  MD notified (2nd page):  Time of second page:  Responding MD:    Time MD responded:

## 2015-09-10 NOTE — Progress Notes (Signed)
Patient tolerated infusion well.  VSS.  BP back to baseline, MD aware.   No changes to BP medication.  Neulasta OnPro not applied this visit, patient was called to return tomorrow for neulasta injection.  He verbalized understanding.

## 2015-09-11 ENCOUNTER — Encounter (HOSPITAL_COMMUNITY): Payer: Medicare Other

## 2015-09-11 ENCOUNTER — Encounter (HOSPITAL_BASED_OUTPATIENT_CLINIC_OR_DEPARTMENT_OTHER): Payer: Medicare Other

## 2015-09-11 DIAGNOSIS — Z5189 Encounter for other specified aftercare: Secondary | ICD-10-CM | POA: Diagnosis not present

## 2015-09-11 DIAGNOSIS — C9110 Chronic lymphocytic leukemia of B-cell type not having achieved remission: Secondary | ICD-10-CM

## 2015-09-11 MED ORDER — PEGFILGRASTIM INJECTION 6 MG/0.6ML ~~LOC~~: 6.0000 mg | PREFILLED_SYRINGE | Freq: Once | SUBCUTANEOUS | Status: DC

## 2015-09-11 MED ORDER — PEGFILGRASTIM INJECTION 6 MG/0.6ML ~~LOC~~
6.0000 mg | PREFILLED_SYRINGE | Freq: Once | SUBCUTANEOUS | Status: AC
  Administered 2015-09-11: 6 mg via SUBCUTANEOUS

## 2015-09-11 MED ORDER — PEGFILGRASTIM INJECTION 6 MG/0.6ML ~~LOC~~
PREFILLED_SYRINGE | SUBCUTANEOUS | Status: AC
  Filled 2015-09-11: qty 0.6

## 2015-09-11 NOTE — Progress Notes (Unsigned)
24 hour chemo f/u in clinic; pt reports feeling fatigued and "a little wobbly"; gait steady. Denies n/v/d.  Denies pain. A&Ox4, in no distress.

## 2015-09-15 ENCOUNTER — Other Ambulatory Visit (HOSPITAL_COMMUNITY): Payer: Self-pay | Admitting: Oncology

## 2015-09-15 ENCOUNTER — Encounter (HOSPITAL_COMMUNITY): Payer: Medicare Other

## 2015-09-15 DIAGNOSIS — C8190 Hodgkin lymphoma, unspecified, unspecified site: Secondary | ICD-10-CM | POA: Diagnosis not present

## 2015-09-15 DIAGNOSIS — Z23 Encounter for immunization: Secondary | ICD-10-CM | POA: Diagnosis not present

## 2015-09-15 DIAGNOSIS — C9110 Chronic lymphocytic leukemia of B-cell type not having achieved remission: Secondary | ICD-10-CM | POA: Diagnosis not present

## 2015-09-15 LAB — COMPREHENSIVE METABOLIC PANEL
ALK PHOS: 110 U/L (ref 38–126)
ALT: 16 U/L — AB (ref 17–63)
AST: 19 U/L (ref 15–41)
Albumin: 3.4 g/dL — ABNORMAL LOW (ref 3.5–5.0)
Anion gap: 6 (ref 5–15)
BILIRUBIN TOTAL: 1 mg/dL (ref 0.3–1.2)
BUN: 25 mg/dL — AB (ref 6–20)
CALCIUM: 8.3 mg/dL — AB (ref 8.9–10.3)
CO2: 23 mmol/L (ref 22–32)
CREATININE: 1.82 mg/dL — AB (ref 0.61–1.24)
Chloride: 107 mmol/L (ref 101–111)
GFR calc Af Amer: 41 mL/min — ABNORMAL LOW (ref >60)
GFR, EST NON AFRICAN AMERICAN: 36 mL/min — AB (ref >60)
Glucose, Bld: 115 mg/dL — ABNORMAL HIGH (ref 65–99)
POTASSIUM: 4.6 mmol/L (ref 3.5–5.1)
Sodium: 136 mmol/L (ref 135–145)
TOTAL PROTEIN: 5.6 g/dL — AB (ref 6.5–8.1)

## 2015-09-15 LAB — CBC WITH DIFFERENTIAL/PLATELET
Basophils Absolute: 0 10*3/uL (ref 0.0–0.1)
Basophils Relative: 0 %
EOS PCT: 5 %
Eosinophils Absolute: 0.7 10*3/uL (ref 0.0–0.7)
HEMATOCRIT: 31.6 % — AB (ref 39.0–52.0)
Hemoglobin: 10.7 g/dL — ABNORMAL LOW (ref 13.0–17.0)
LYMPHS ABS: 7.1 10*3/uL — AB (ref 0.7–4.0)
LYMPHS PCT: 45 %
MCH: 34 pg (ref 26.0–34.0)
MCHC: 33.9 g/dL (ref 30.0–36.0)
MCV: 100.3 fL — AB (ref 78.0–100.0)
Monocytes Absolute: 0.5 10*3/uL (ref 0.1–1.0)
Monocytes Relative: 3 %
Neutro Abs: 7.5 10*3/uL (ref 1.7–7.7)
Neutrophils Relative %: 48 %
Platelets: 54 10*3/uL — ABNORMAL LOW (ref 150–400)
RBC: 3.15 MIL/uL — AB (ref 4.22–5.81)
RDW: 16.8 % — AB (ref 11.5–15.5)
WBC: 15.8 10*3/uL — AB (ref 4.0–10.5)

## 2015-09-15 LAB — URIC ACID: Uric Acid, Serum: 14.9 mg/dL — ABNORMAL HIGH (ref 4.4–7.6)

## 2015-09-15 LAB — PHOSPHORUS: PHOSPHORUS: 4.4 mg/dL (ref 2.5–4.6)

## 2015-09-15 MED ORDER — ONDANSETRON HCL 8 MG PO TABS: 8.0000 mg | ORAL_TABLET | Freq: Three times a day (TID) | ORAL | Status: DC | PRN

## 2015-09-16 ENCOUNTER — Encounter (HOSPITAL_BASED_OUTPATIENT_CLINIC_OR_DEPARTMENT_OTHER): Payer: Medicare Other

## 2015-09-16 ENCOUNTER — Encounter (HOSPITAL_COMMUNITY): Payer: Self-pay

## 2015-09-16 DIAGNOSIS — C9110 Chronic lymphocytic leukemia of B-cell type not having achieved remission: Secondary | ICD-10-CM

## 2015-09-16 MED ORDER — HEPARIN SOD (PORK) LOCK FLUSH 100 UNIT/ML IV SOLN
500.0000 [IU] | Freq: Once | INTRAVENOUS | Status: AC
  Administered 2015-09-16: 500 [IU] via INTRAVENOUS

## 2015-09-16 MED ORDER — HEPARIN SOD (PORK) LOCK FLUSH 100 UNIT/ML IV SOLN
INTRAVENOUS | Status: AC
  Filled 2015-09-16: qty 5

## 2015-09-16 MED ORDER — SODIUM CHLORIDE 0.9% FLUSH
10.0000 mL | INTRAVENOUS | Status: DC | PRN
  Administered 2015-09-16: 10 mL via INTRAVENOUS
  Filled 2015-09-16: qty 10

## 2015-09-16 MED ORDER — SODIUM CHLORIDE 0.9 % IV SOLN
INTRAVENOUS | Status: DC
  Administered 2015-09-16: 14:00:00 via INTRAVENOUS

## 2015-09-16 MED ORDER — SODIUM CHLORIDE 0.9 % IV SOLN
6.0000 mg | Freq: Once | INTRAVENOUS | Status: AC
  Administered 2015-09-16: 6 mg via INTRAVENOUS
  Filled 2015-09-16: qty 4

## 2015-09-16 NOTE — Patient Instructions (Signed)
[place] at [facility] Discharge Instructions  RECOMMENDATIONS MADE BY THE CONSULTANT AND ANY TEST RESULTS WILL BE SENT TO YOUR REFERRING PHYSICIAN.  Today you received an Elitek infusion. Return as scheduled for chemotherapy and office visit. Call the clinic should you have any questions or concerns prior to your next visit.   Thank you for choosing [place] at [facility] to provide your oncology and hematology care.  To afford each patient quality time with our provider, please arrive at least 15 minutes before your scheduled appointment time.   Beginning January 23rd 2017 lab work for the [facility] will be done in the  Main lab at [facility] on 1st floor. If you have a lab appointment with the [facility] please come in thru the  Main Entrance and check in at the main information desk  You need to re-schedule your appointment should you arrive 10 or more minutes late.  We strive to give you quality time with our providers, and arriving late affects you and other patients whose appointments are after yours.  Also, if you no show three or more times for appointments you may be dismissed from the clinic at the providers discretion.     Again, thank you for choosing [facility].  Our hope is that these requests will decrease the amount of time that you wait before being seen by our physicians.       _____________________________________________________________  Should you have questions after your visit to [facility], please contact our office at [phone] between the hours of 8:30 a.m. and 4:30 p.m.  Voicemails left after 4:30 p.m. will not be returned until the following business day.  For prescription refill requests, have your pharmacy contact our office.         Resources For Cancer Patients and their Caregivers ? American Cancer Society: Can assist with transportation, wigs, general needs, runs  Look Good Feel Better.        709-328-8588 ? Cancer Care: Provides financial assistance, online support groups, medication/co-pay assistance.  1-800-813-HOPE 934-611-0677) ? [place] Assists [place] cancer patients and their families through emotional , educational and financial support.  [phone] ? [place] DSS Where to apply for food stamps, Medicaid and utility assistance. 709-364-7271 ? RCATS: Transportation to medical appointments. (778)575-3701 ? Social Security Administration: May apply for disability if have a Stage IV cancer. [phone] ? [employer], Disability and Transit Services: Assists with nutrition, care and transit needs. [place] Support Programs: @10RELATIVEDAYS @ > Cancer Support Group  2nd Tuesday of the month 1pm-2pm, Journey Room  > Creative Journey  3rd Tuesday of the month 1130am-1pm, Journey Room  > Look Good Feel Better  1st Wednesday of the month 10am-12 noon, Journey Room (Call [place] to register [phone])

## 2015-09-16 NOTE — Progress Notes (Signed)
Tolerated infusion w/o adverse reaction. A&Ox4, in no distress.  Discharged ambulatory.  

## 2015-09-17 ENCOUNTER — Ambulatory Visit (HOSPITAL_COMMUNITY): Payer: Medicare Other

## 2015-09-23 ENCOUNTER — Telehealth (HOSPITAL_COMMUNITY): Payer: Self-pay | Admitting: *Deleted

## 2015-09-23 NOTE — Telephone Encounter (Signed)
Pt vomited x 1 this morning. Pt states that he feels better after throwing up. He still feels like he has a taste in his mouth. He has eaten a little here or there. He took a nausea pill (zofran) today around 12 noon. Pt states the highest his temp has been is 100.2. This occurred yesterday. It is around 99.3 today. Pt due for labs and to see Tom tomorrow. Pt sounds a little stuffy over the phone and reports having drainage. He states that it is clear. He later states that it is green here and there. He has a little cough too. He states that all he feels like doing is sleep. He states he lays around and gets up to make something to eat and goes back to lay/sit down. Pt states he has had sweats and chills here and there.

## 2015-09-23 NOTE — Telephone Encounter (Signed)
Will evaluate patient tomorrow.  If fever recurs, he should report to ED.  If his condition worsens, he should report to ED.  TK

## 2015-09-24 ENCOUNTER — Other Ambulatory Visit (HOSPITAL_COMMUNITY): Payer: Self-pay | Admitting: Oncology

## 2015-09-24 ENCOUNTER — Encounter (HOSPITAL_BASED_OUTPATIENT_CLINIC_OR_DEPARTMENT_OTHER): Payer: Medicare Other | Admitting: Oncology

## 2015-09-24 ENCOUNTER — Encounter (HOSPITAL_BASED_OUTPATIENT_CLINIC_OR_DEPARTMENT_OTHER): Payer: Medicare Other

## 2015-09-24 ENCOUNTER — Encounter (HOSPITAL_COMMUNITY): Payer: Medicare Other

## 2015-09-24 VITALS — BP 93/47 | HR 81 | Temp 97.6°F | Resp 20 | Wt 212.7 lb

## 2015-09-24 DIAGNOSIS — C9110 Chronic lymphocytic leukemia of B-cell type not having achieved remission: Secondary | ICD-10-CM

## 2015-09-24 DIAGNOSIS — E790 Hyperuricemia without signs of inflammatory arthritis and tophaceous disease: Secondary | ICD-10-CM

## 2015-09-24 DIAGNOSIS — D72829 Elevated white blood cell count, unspecified: Secondary | ICD-10-CM | POA: Diagnosis not present

## 2015-09-24 DIAGNOSIS — C8190 Hodgkin lymphoma, unspecified, unspecified site: Secondary | ICD-10-CM | POA: Diagnosis not present

## 2015-09-24 DIAGNOSIS — R5383 Other fatigue: Secondary | ICD-10-CM

## 2015-09-24 DIAGNOSIS — R634 Abnormal weight loss: Secondary | ICD-10-CM

## 2015-09-24 DIAGNOSIS — R093 Abnormal sputum: Secondary | ICD-10-CM

## 2015-09-24 DIAGNOSIS — Z23 Encounter for immunization: Secondary | ICD-10-CM | POA: Diagnosis not present

## 2015-09-24 LAB — CBC WITH DIFFERENTIAL/PLATELET
Basophils Absolute: 0.1 10*3/uL (ref 0.0–0.1)
Basophils Relative: 1 %
EOS ABS: 0.4 10*3/uL (ref 0.0–0.7)
EOS PCT: 7 %
HCT: 29.9 % — ABNORMAL LOW (ref 39.0–52.0)
HEMOGLOBIN: 9.9 g/dL — AB (ref 13.0–17.0)
LYMPHS ABS: 3.4 10*3/uL (ref 0.7–4.0)
LYMPHS PCT: 69 %
MCH: 33.1 pg (ref 26.0–34.0)
MCHC: 33.1 g/dL (ref 30.0–36.0)
MCV: 100 fL (ref 78.0–100.0)
MONOS PCT: 15 %
Monocytes Absolute: 0.7 10*3/uL (ref 0.1–1.0)
Neutro Abs: 0.4 10*3/uL — ABNORMAL LOW (ref 1.7–7.7)
Neutrophils Relative %: 7 %
PLATELETS: 83 10*3/uL — AB (ref 150–400)
RBC: 2.99 MIL/uL — ABNORMAL LOW (ref 4.22–5.81)
RDW: 16.5 % — ABNORMAL HIGH (ref 11.5–15.5)
WBC: 4.9 10*3/uL (ref 4.0–10.5)

## 2015-09-24 LAB — COMPREHENSIVE METABOLIC PANEL
ALK PHOS: 91 U/L (ref 38–126)
ALT: 12 U/L — ABNORMAL LOW (ref 17–63)
ANION GAP: 7 (ref 5–15)
AST: 15 U/L (ref 15–41)
Albumin: 3.2 g/dL — ABNORMAL LOW (ref 3.5–5.0)
BUN: 20 mg/dL (ref 6–20)
CALCIUM: 8.2 mg/dL — AB (ref 8.9–10.3)
CO2: 21 mmol/L — AB (ref 22–32)
CREATININE: 1.75 mg/dL — AB (ref 0.61–1.24)
Chloride: 105 mmol/L (ref 101–111)
GFR, EST AFRICAN AMERICAN: 43 mL/min — AB (ref >60)
GFR, EST NON AFRICAN AMERICAN: 37 mL/min — AB (ref >60)
Glucose, Bld: 129 mg/dL — ABNORMAL HIGH (ref 65–99)
Potassium: 4.1 mmol/L (ref 3.5–5.1)
SODIUM: 133 mmol/L — AB (ref 135–145)
TOTAL PROTEIN: 5.8 g/dL — AB (ref 6.5–8.1)
Total Bilirubin: 0.7 mg/dL (ref 0.3–1.2)

## 2015-09-24 LAB — PHOSPHORUS: PHOSPHORUS: 3.1 mg/dL (ref 2.5–4.6)

## 2015-09-24 LAB — URIC ACID: URIC ACID, SERUM: 8.9 mg/dL — AB (ref 4.4–7.6)

## 2015-09-24 MED ORDER — SODIUM CHLORIDE 0.9 % IV SOLN
INTRAVENOUS | Status: DC
  Administered 2015-09-24: 15:00:00 via INTRAVENOUS

## 2015-09-24 MED ORDER — HEPARIN SOD (PORK) LOCK FLUSH 100 UNIT/ML IV SOLN
500.0000 [IU] | Freq: Once | INTRAVENOUS | Status: AC
  Administered 2015-09-24: 500 [IU] via INTRAVENOUS

## 2015-09-24 NOTE — Progress Notes (Signed)
[name] Tolerated fluids today Discharged ambulatory

## 2015-09-24 NOTE — Progress Notes (Signed)
[name] Nakayama, MD [address] [place] [address]  Leukemia, lymphocytic, chronic [facility])  CURRENT THERAPY:Bendamustine/Rituxan beginning on 09/08/2015  INTERVAL HISTORY: [name] 70 y.o. 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  Now on systemic chemotherapy. AND History of Hodgkin's Lymphoma, treated in 2006. AND Hypogammaglobulenemia AND CKD, Stage III      Hodgkin's disease ([place])   10/02/2007 Initial Diagnosis HODGKIN'S DISEASE    Leukemia, lymphocytic, chronic ([place])   07/22/2015 Procedure IR bone marrow aspiration and biopsy   07/23/2015 Pathology Results Bone Marrow, Aspirate,Biopsy, and Clot, left iliac crest - EXTENSIVE INVOLVEMENT BY CHRONIC LYMPHOCYTIC LEUKEMIA. PERIPHERAL BLOOD: - CHRONIC LYMPHOCYTIC LEUKEMIA. - NORMOCYTIC ANEMIA. - THROMBOCYTOPENIA.   07/23/2015 Pathology Results Bone Marrow Flow Cytometry - CHRONIC LYMPHOCYTIC LEUKEMIA   08/20/2015 Imaging CT CAP- Mediastinal and hilar lymphadenopathy consistent with CLL. No pulmonary lesions. Marked splenomegaly. Mild upper abdominal retroperitoneal adenopathy.   08/27/2015 Miscellaneous Imbruvica therapy is cost-prohibitive.   09/08/2015 -  Chemotherapy Bendamustine day 1+2 every 28 days and Rituxan Day 1 every 28 days.   His biggest complaint is fatigue which is a common side effect of chemotherapy.  He is educated on his significant disease burden which has limited his reserve going into treatment.  He is educated that as he responds to treatment, treatment should actually become more tolerable.  He asks about a dose reduction, but he does not meet criteria for dose reduction.  He notes one episode of nausea with vomiting.  None since.  He notes that his anti-emetics at home are effective.  He is a poor historian and getting a good grasp of his clinical situation is  difficult at times.  He denies any fevers since he called the clinic yesterday.  We will need to be on the look-out for any acute infections and treat promptly.  He may need IVIG as well.  His BP is noted to be low today.  He reports that he is drinking 2 bottles of H2O and 1 bottle of gatorade.  He is advised to increase his PO H2O intake.  Weight loss is noted.  He does not appear to have lost weight.  I will consult [name], RD.  [name] reports a decrease in appetite.  Review of Systems  Constitutional: Positive for fever, weight loss and malaise/fatigue. Negative for chills.  HENT: Negative.   Eyes: Negative.   Respiratory: Positive for cough and sputum production.   Cardiovascular: Negative.   Gastrointestinal: Negative.   Genitourinary: Negative.   Musculoskeletal: Negative.   Skin: Negative.   Neurological: Positive for weakness.  Endo/Heme/Allergies: Negative.   Psychiatric/Behavioral: Negative.     Past Medical History  Diagnosis Date  . Aortic stenosis   . Atrial fibrillation ([place])   . Coronary atherosclerosis of native coronary artery   . Hyperlipidemia   . Essential hypertension, benign   . BPH (benign prostatic hypertrophy)   . Arthritis   . Acid reflux   . Hypogammaglobulinemia ([place])   . Pre-diabetes 08/04/2011  . CLL (chronic lymphoblastic leukemia)   . Hodgkin's disease   . Kidney stones   . Wears glasses   . Wears dentures   . HOH (hard of hearing)   . Indirect inguinal hernia 04/13/2013    Past Surgical History  Procedure Laterality Date  . Aortic valve replacement  10/09    63mm Magna  Pericardial   . Esophagogastroduodenoscopy      scrapping of throat and stretching  . Lithotripsy    . Bone marrow aspiration    . Portacath placement    . 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 Colon surgery    . Dental surgery  08/2012  . Multiple tooth extractions    . Mass excision N/A 12/05/2012    Procedure: EXCISION MIDLINE NECK MASS;   Surgeon: [name] Dike, MD;  Location: [facility];  Service: ENT;  Laterality: N/A;  . Tongue biopsy  12/2012  . Cataract extraction w/phaco Right 01/07/2014    Procedure: CATARACT EXTRACTION PHACO AND INTRAOCULAR LENS PLACEMENT (IOC);  Surgeon: [name] Branch, MD;  Location: [facility];  Service: Ophthalmology;  Laterality: Right;  CDE:15.31  . Eye surgery Right 02/07/2014    cataract  . Cataract extraction w/phaco Left 03/25/2014    Procedure: CATARACT EXTRACTION PHACO AND INTRAOCULAR LENS PLACEMENT (IOC);  Surgeon: [name] Branch, MD;  Location: [facility];  Service: Ophthalmology;  Laterality: Left;  CDE 6.55    Family History  Problem Relation Age of Onset  . Heart failure Mother   . Blindness Mother   . Cancer Father 29    Lung   . Cancer Brother 64    acute leukemia     Social History   Social History  . Marital Status: Widowed    Spouse Name: N/A  . Number of Children: N/A  . Years of Education: N/A   Social History Main Topics  . Smoking status: Never Smoker   . Smokeless tobacco: Never Used     Comment: pt denies tobacco use   . Alcohol Use: No     Comment: pt denies alcohol   . Drug Use: No  . Sexual Activity: Not Currently   Other Topics Concern  . Not on file   Social History Narrative     PHYSICAL EXAMINATION  ECOG PERFORMANCE STATUS: 1 - Symptomatic but completely ambulatory  Filed Vitals:   09/24/15 1355 09/24/15 1358  BP: 118/58 93/47  Pulse: 81   Temp: 97.6 F (36.4 C)   Resp: 20     GENERAL:alert, no distress, well nourished, well developed, comfortable, cooperative, obese and unaccompanied, his usual self, slow to answer questions. SKIN: skin color, texture, turgor are normal, no rashes or significant lesions HEAD: Normocephalic, No masses, lesions, tenderness or abnormalities EYES: normal, EOMI, Conjunctiva are pink and non-injected EARS: External ears normal OROPHARYNX:lips, buccal mucosa, and tongue normal and mucous membranes are moist    NECK: supple, trachea midline LYMPH:  not examined BREAST:not examined LUNGS: clear to auscultation and percussion HEART: regular rate & rhythm, no murmurs and no gallops ABDOMEN:abdomen soft, non-tender, obese and normal bowel sounds BACK: Back symmetric, no curvature. EXTREMITIES:less then 2 second capillary refill, no joint deformities, effusion, or inflammation, no skin discoloration, no cyanosis  NEURO: alert & oriented x 3 with fluent speech, no focal motor/sensory deficits, gait normal   LABORATORY DATA: CBC    Component Value Date/Time   WBC 4.9 09/24/2015 1323   RBC 2.99* 09/24/2015 1323   RBC 3.42* 04/18/2015 1347   HGB 9.9* 09/24/2015 1323   HCT 29.9* 09/24/2015 1323   PLT 83* 09/24/2015 1323   MCV 100.0 09/24/2015 1323   MCH 33.1 09/24/2015 1323   MCHC 33.1 09/24/2015 1323   RDW 16.5* 09/24/2015 1323   LYMPHSABS 3.4 09/24/2015 1323   MONOABS 0.7 09/24/2015 1323  EOSABS 0.4 09/24/2015 1323   BASOSABS 0.1 09/24/2015 1323      Chemistry      Component Value Date/Time   NA 133* 09/24/2015 1323   K 4.1 09/24/2015 1323   CL 105 09/24/2015 1323   CO2 21* 09/24/2015 1323   BUN 20 09/24/2015 1323   CREATININE 1.75* 09/24/2015 1323   CREATININE 1.59* 06/10/2015 1601      Component Value Date/Time   CALCIUM 8.2* 09/24/2015 1323   ALKPHOS 91 09/24/2015 1323   AST 15 09/24/2015 1323   ALT 12* 09/24/2015 1323   BILITOT 0.7 09/24/2015 1323        PENDING LABS:   RADIOGRAPHIC STUDIES:  No results found.   PATHOLOGY:    ASSESSMENT AND PLAN:  Leukemia, lymphocytic, chronic (HCC) CLL, requiring treatment due to extensive involvement of CLL in bone marrow aspiration and biopsy in addition to climbing WBC and thrombocytopenia.  Oncology history is updated.  Labs today: CBC diff, CMET, phosphorus, uric acid.  I personally reviewed and went over laboratory results with the patient.  The results are noted within this dictation.  He has required three doses  of Rasburicase for hyperuricemia secondary to tumor lysis.  If uric acid remains elevated, will need to set-up another dose of rasburicase.  Treatment have caused a significant decrease in leukocytosis thus far.  His uric acid today is elevated.  This was reported after his departure from the clinic.  Nursing is advised to set him up for another dose of 6 mg of Rasburicase.  Order is signed and held.  He notes significant fatigue requiring a motorized scooter when shopping at [place].  I suspect his significant fatigue is multifactorial from deconditioning in addition to his significant tumor burden.  His weight is down 16 lbs x 2 weeks.  He notes that his appetite has declined and taste for foods has changed.  I will ask [name], RD to see the patient for recommendations.  He admits to green sputum intermittently.  This has been ongoing for months.  He notes a temperature at home yesterday of 100.72F.  Today, he is afebrile. We will need to monitor closely as he may need a dose of IVIG with a course of antibiotic.  However, no acute findings on exam today.  He notes 1 episode of emesis with immediate improvement in nausea.  He notes that he is taking his anti-emetic medication at home and it is effective for him.  He denies any nausea today.  His BP demonstrates signs of orthostatic hypotension with postural changes (118/58 when sitting and 93/47 when standing).  As a result, I will get him 1 L of NS over 2 hours this afternoon.  Fatigue and weakness is his biggest complaint.  He is educated that this is a common side effect of chemotherapy.  He is currently at his nadir point and therefore, he should be improving over the next 48-72 hours.  Additionally, his fatigue is likely multifactorial and exacerbated by his disease burden.  He will return in 2 weeks for follow-up and treatment.    ORDERS PLACED FOR THIS ENCOUNTER: No orders of the defined types were placed in this encounter.     MEDICATIONS PRESCRIBED THIS ENCOUNTER: No orders of the defined types were placed in this encounter.    THERAPY PLAN:  Continue treatment as planned.  All questions were answered. The patient knows to call the clinic with any problems, questions or concerns. We can certainly see the patient  much sooner if necessary.  Patient and plan discussed with Dr. Ancil Linsey and she is in agreement with the aforementioned.   This note is electronically signed by: Doy Mince 09/24/2015 7:48 PM

## 2015-09-24 NOTE — Patient Instructions (Signed)
[facility] at [facility] Discharge Instructions  RECOMMENDATIONS MADE BY THE CONSULTANT AND ANY TEST RESULTS WILL BE SENT TO YOUR REFERRING PHYSICIAN.   Fluids today Follow up as scheduled Please call the clinic if you have any questions or concerns    Thank you for choosing [facility] at [facility] to provide your oncology and hematology care.  To afford each patient quality time with our provider, please arrive at least 15 minutes before your scheduled appointment time.   Beginning January 23rd 2017 lab work for the Cancer Center will be done in the  Main lab at [facility] on 1st floor. If you have a lab appointment with the Cancer Center please come in thru the  Main Entrance and check in at the main information desk  You need to re-schedule your appointment should you arrive 10 or more minutes late.  We strive to give you quality time with our providers, and arriving late affects you and other patients whose appointments are after yours.  Also, if you no show three or more times for appointments you may be dismissed from the clinic at the providers discretion.     Again, thank you for choosing [facility].  Our hope is that these requests will decrease the amount of time that you wait before being seen by our physicians.       _____________________________________________________________  Should you have questions after your visit to [facility], please contact our office at [phone] between the hours of 8:30 a.m. and 4:30 p.m.  Voicemails left after 4:30 p.m. will not be returned until the following business day.  For prescription refill requests, have your pharmacy contact our office.         Resources For Cancer Patients and their Caregivers ? American Cancer Society: Can assist with transportation, wigs, general needs, runs Look Good Feel Better.        1-888-227-6333 ? Cancer Care: Provides  financial assistance, online support groups, medication/co-pay assistance.  1-800-813-HOPE (4673) ? [facility] Assists [place] cancer patients and their families through emotional , educational and financial support.  336-427-4357 ? [place] DSS Where to apply for food stamps, Medicaid and utility assistance. 336-342-1394 ? RCATS: Transportation to medical appointments. 336-347-2287 ? Social Security Administration: May apply for disability if have a Stage IV cancer. 336-342-7796 1-800-772-1213 ? [place] Aging, Disability and Transit Services: Assists with nutrition, care and transit needs. 336-349-2343  Cancer Center Support Programs: @10RELATIVEDAYS@ > Cancer Support Group  2nd Tuesday of the month 1pm-2pm, Journey Room  > Creative Journey  3rd Tuesday of the month 1130am-1pm, Journey Room  > Look Good Feel Better  1st Wednesday of the month 10am-12 noon, Journey Room (Call American Cancer Society to register [phone])    

## 2015-09-24 NOTE — Assessment & Plan Note (Addendum)
CLL, requiring treatment due to extensive involvement of CLL in bone marrow aspiration and biopsy in addition to climbing WBC and thrombocytopenia.  Oncology history is updated.  Labs today: CBC diff, CMET, phosphorus, uric acid.  I personally reviewed and went over laboratory results with the patient.  The results are noted within this dictation.  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  If uric acid remains elevated, will need to set-up another dose of rasburicase.  Treatment have caused a significant decrease in leukocytosis thus far.  His uric acid today is elevated.  This was reported after his departure from the clinic.  Nursing is advised to set him up for another dose of 6 mg of Rasburicase.  Order is signed and held.  He notes significant fatigue requiring a motorized scooter when shopping at [place].  I suspect his significant fatigue is multifactorial from deconditioning in addition to his significant tumor burden.  His weight is down 16 lbs x 2 weeks.  He notes that his appetite has declined and taste for foods has changed.  I will ask Burtis Junes, RD to see the patient for recommendations.  He admits to green sputum intermittently.  This has been ongoing for months.  He notes a temperature at home yesterday of 100.12F.  Today, he is afebrile. We will need to monitor closely as he may need a dose of IVIG with a course of antibiotic.  However, no acute findings on exam today.  He notes 1 episode of emesis with immediate improvement in nausea.  He notes that he is taking his anti-emetic medication at home and it is effective for him.  He denies any nausea today.  His BP demonstrates signs of orthostatic hypotension with postural changes (118/58 when sitting and 93/47 when standing).  As a result, I will get him 1 L of NS over 2 hours this afternoon.  Fatigue and weakness is his biggest complaint.  He is educated that this is a common side  effect of chemotherapy.  He is currently at his nadir point and therefore, he should be improving over the next 48-72 hours.  Additionally, his fatigue is likely multifactorial and exacerbated by his disease burden.  He will return in 2 weeks for follow-up and treatment.

## 2015-09-25 ENCOUNTER — Ambulatory Visit (HOSPITAL_COMMUNITY)
Admission: RE | Admit: 2015-09-25 | Discharge: 2015-09-25 | Disposition: A | Discharge status: Home / Self Care | Payer: Medicare Other | Source: Ambulatory Visit | Attending: Oncology | Admitting: Oncology

## 2015-09-25 ENCOUNTER — Encounter (HOSPITAL_BASED_OUTPATIENT_CLINIC_OR_DEPARTMENT_OTHER): Payer: Medicare Other

## 2015-09-25 ENCOUNTER — Encounter (HOSPITAL_COMMUNITY): Payer: Self-pay

## 2015-09-25 ENCOUNTER — Other Ambulatory Visit (HOSPITAL_COMMUNITY): Payer: Self-pay | Admitting: Oncology

## 2015-09-25 DIAGNOSIS — Z952 Presence of prosthetic heart valve: Secondary | ICD-10-CM | POA: Insufficient documentation

## 2015-09-25 DIAGNOSIS — D801 Nonfamilial hypogammaglobulinemia: Secondary | ICD-10-CM

## 2015-09-25 DIAGNOSIS — J069 Acute upper respiratory infection, unspecified: Secondary | ICD-10-CM

## 2015-09-25 DIAGNOSIS — R05 Cough: Secondary | ICD-10-CM

## 2015-09-25 DIAGNOSIS — C9110 Chronic lymphocytic leukemia of B-cell type not having achieved remission: Secondary | ICD-10-CM

## 2015-09-25 DIAGNOSIS — Z23 Encounter for immunization: Secondary | ICD-10-CM | POA: Diagnosis not present

## 2015-09-25 DIAGNOSIS — R059 Cough, unspecified: Secondary | ICD-10-CM

## 2015-09-25 DIAGNOSIS — C8190 Hodgkin lymphoma, unspecified, unspecified site: Secondary | ICD-10-CM | POA: Diagnosis not present

## 2015-09-25 MED ORDER — HEPARIN SOD (PORK) LOCK FLUSH 100 UNIT/ML IV SOLN
500.0000 [IU] | Freq: Once | INTRAVENOUS | Status: AC
  Administered 2015-09-25: 500 [IU] via INTRAVENOUS

## 2015-09-25 MED ORDER — SODIUM CHLORIDE 0.9 % IV SOLN
6.0000 mg | Freq: Once | INTRAVENOUS | Status: AC
  Administered 2015-09-25: 6 mg via INTRAVENOUS
  Filled 2015-09-25: qty 4

## 2015-09-25 MED ORDER — LEVOFLOXACIN 500 MG PO TABS: 500.0000 mg | ORAL_TABLET | Freq: Every day | ORAL | Status: DC

## 2015-09-25 MED ORDER — HYDROCODONE-HOMATROPINE 5-1.5 MG/5ML PO SYRP: 5.0000 mL | ORAL_SOLUTION | Freq: Four times a day (QID) | ORAL | Status: DC | PRN

## 2015-09-25 MED ORDER — HYDROCODONE-HOMATROPINE 5-1.5 MG/5ML PO SYRP
5.0000 mL | ORAL_SOLUTION | Freq: Once | ORAL | Status: AC
  Filled 2015-09-25: qty 5

## 2015-09-25 MED ORDER — SODIUM CHLORIDE 0.9 % IV SOLN
INTRAVENOUS | Status: DC
  Administered 2015-09-25: 08:00:00 via INTRAVENOUS

## 2015-09-25 MED ORDER — HYDROCODONE-HOMATROPINE 5-1.5 MG/5ML PO SYRP
5.0000 mL | ORAL_SOLUTION | Freq: Once | ORAL | Status: AC
Start: 2015-09-25 — End: 2015-09-25
  Administered 2015-09-25: 5 mL via ORAL

## 2015-09-25 NOTE — Progress Notes (Signed)
0830 Pt coughing, Tom Kefalas notified, cough medication ordered and given  0910 pt taken to radiology for chest x-ray  [place] and fluids well today

## 2015-09-25 NOTE — Patient Instructions (Signed)
[place] at [facility] Discharge Instructions  RECOMMENDATIONS MADE BY THE CONSULTANT AND ANY TEST RESULTS WILL BE SENT TO YOUR REFERRING PHYSICIAN.  elitek today Chest x-ray today Fluids today Cough medications today Return as scheduled Please call the clinic if you have any questions or concerns   Thank you for choosing [place] at [facility] to provide your oncology and hematology care.  To afford each patient quality time with our provider, please arrive at least 15 minutes before your scheduled appointment time.   Beginning January 23rd 2017 lab work for the [facility] will be done in the  Main lab at [facility] on 1st floor. If you have a lab appointment with the [facility] please come in thru the  Main Entrance and check in at the main information desk  You need to re-schedule your appointment should you arrive 10 or more minutes late.  We strive to give you quality time with our providers, and arriving late affects you and other patients whose appointments are after yours.  Also, if you no show three or more times for appointments you may be dismissed from the clinic at the providers discretion.     Again, thank you for choosing [facility].  Our hope is that these requests will decrease the amount of time that you wait before being seen by our physicians.       _____________________________________________________________  Should you have questions after your visit to [facility], please contact our office at [phone] between the hours of 8:30 a.m. and 4:30 p.m.  Voicemails left after 4:30 p.m. will not be returned until the following business day.  For prescription refill requests, have your pharmacy contact our office.         Resources For Cancer Patients and their Caregivers ? American Cancer Society: Can assist with transportation, wigs, general needs, runs Look Good Feel Better.         276-241-1638 ? Cancer Care: Provides financial assistance, online support groups, medication/co-pay assistance.  1-800-813-HOPE (308) 455-6783) ? [facility] Assists [place] cancer patients and their families through emotional , educational and financial support.  781-214-0643 ? [place] DSS Where to apply for food stamps, Medicaid and utility assistance. 802 497 2947 ? RCATS: Transportation to medical appointments. 431 848 1086 ? Social Security Administration: May apply for disability if have a Stage IV cancer. 343-733-1015 937-629-9736 ? [employer], Disability and Transit Services: Assists with nutrition, care and transit needs. [place] Support Programs: @10RELATIVEDAYS @ > Cancer Support Group  2nd Tuesday of the month 1pm-2pm, Journey Room  > Creative Journey  3rd Tuesday of the month 1130am-1pm, Journey Room  > Look Good Feel Better  1st Wednesday of the month 10am-12 noon, Journey Room (Call [place] to register [phone])

## 2015-09-26 LAB — IGG, IGA, IGM
IgA: <5 mg/dL — ABNORMAL LOW (ref 61–437)
IgG (Immunoglobin G), Serum: <30 mg/dL — ABNORMAL LOW (ref 700–1600)

## 2015-09-28 ENCOUNTER — Other Ambulatory Visit (HOSPITAL_COMMUNITY): Payer: Self-pay | Admitting: Oncology

## 2015-10-03 ENCOUNTER — Encounter (HOSPITAL_COMMUNITY): Payer: Medicare Other | Attending: Oncology

## 2015-10-03 VITALS — BP 96/45 | HR 78 | Temp 99.4°F | Resp 20 | Wt 216.0 lb

## 2015-10-03 DIAGNOSIS — Z23 Encounter for immunization: Secondary | ICD-10-CM | POA: Insufficient documentation

## 2015-10-03 DIAGNOSIS — C8190 Hodgkin lymphoma, unspecified, unspecified site: Secondary | ICD-10-CM | POA: Insufficient documentation

## 2015-10-03 DIAGNOSIS — C9110 Chronic lymphocytic leukemia of B-cell type not having achieved remission: Secondary | ICD-10-CM | POA: Insufficient documentation

## 2015-10-03 DIAGNOSIS — D801 Nonfamilial hypogammaglobulinemia: Secondary | ICD-10-CM | POA: Diagnosis not present

## 2015-10-03 MED ORDER — HEPARIN SOD (PORK) LOCK FLUSH 100 UNIT/ML IV SOLN
INTRAVENOUS | Status: AC
  Filled 2015-10-03: qty 5

## 2015-10-03 MED ORDER — ACETAMINOPHEN 325 MG PO TABS
650.0000 mg | ORAL_TABLET | Freq: Four times a day (QID) | ORAL | Status: DC | PRN
  Administered 2015-10-03: 650 mg via ORAL
  Filled 2015-10-03: qty 2

## 2015-10-03 MED ORDER — DIPHENHYDRAMINE HCL 25 MG PO CAPS
25.0000 mg | ORAL_CAPSULE | Freq: Once | ORAL | Status: AC
  Administered 2015-10-03: 25 mg via ORAL
  Filled 2015-10-03: qty 1

## 2015-10-03 MED ORDER — SODIUM CHLORIDE 0.9 % IV SOLN
Freq: Once | INTRAVENOUS | Status: DC
Start: 2015-10-03 — End: 2015-10-03

## 2015-10-03 MED ORDER — MEPERIDINE HCL 50 MG/ML IJ SOLN
INTRAMUSCULAR | Status: AC
  Filled 2015-10-03: qty 1

## 2015-10-03 MED ORDER — HEPARIN SOD (PORK) LOCK FLUSH 100 UNIT/ML IV SOLN
500.0000 [IU] | Freq: Once | INTRAVENOUS | Status: AC
  Administered 2015-10-03: 500 [IU] via INTRAVENOUS

## 2015-10-03 MED ORDER — SODIUM CHLORIDE 0.9 % IV SOLN
INTRAVENOUS | Status: DC
  Administered 2015-10-03: 15:00:00 via INTRAVENOUS

## 2015-10-03 MED ORDER — IMMUNE GLOBULIN (HUMAN) 20 GM/200ML IV SOLN
0.4000 g/kg | Freq: Once | INTRAVENOUS | Status: AC
  Administered 2015-10-03: 40 g via INTRAVENOUS
  Filled 2015-10-03: qty 400

## 2015-10-03 MED ORDER — MEPERIDINE HCL 25 MG/ML IJ SOLN
25.0000 mg | Freq: Once | INTRAMUSCULAR | Status: AC
  Administered 2015-10-03: 25 mg via INTRAVENOUS

## 2015-10-03 NOTE — Patient Instructions (Signed)
[place] at [facility] Discharge Instructions  RECOMMENDATIONS MADE BY THE CONSULTANT AND ANY TEST RESULTS WILL BE SENT TO YOUR REFERRING PHYSICIAN.   Today you received IVIG due to your immunoglobulins being low in your body.     Thank you for choosing [place] at [facility] to provide your oncology and hematology care.  To afford each patient quality time with our provider, please arrive at least 15 minutes before your scheduled appointment time.   Beginning January 23rd 2017 lab work for the [facility] will be done in the  Main lab at [facility] on 1st floor. If you have a lab appointment with the [place] [facility] please come in thru the  Main Entrance and check in at the main information desk  You need to re-schedule your appointment should you arrive 10 or more minutes late.  We strive to give you quality time with our providers, and arriving late affects you and other patients whose appointments are after yours.  Also, if you no show three or more times for appointments you may be dismissed from the clinic at the providers discretion.     Again, thank you for choosing [facility].  Our hope is that these requests will decrease the amount of time that you wait before being seen by our physicians.       _____________________________________________________________  Should you have questions after your visit to [facility], please contact our office at [phone] between the hours of 8:30 a.m. and 4:30 p.m.  Voicemails left after 4:30 p.m. will not be returned until the following business day.  For prescription refill requests, have your pharmacy contact our office.         Resources For Cancer Patients and their Caregivers ? American Cancer Society: Can assist with transportation, wigs, general needs, runs Look Good Feel Better.        772-696-8300 ? Cancer Care: Provides financial assistance,  online support groups, medication/co-pay assistance.  1-800-813-HOPE 306-646-0145) ? [facility] Assists [place] cancer patients and their families through emotional , educational and financial support.  ([phone] ? [place] DSS Where to apply for food stamps, Medicaid and utility assistance. 9704312988 ? RCATS: Transportation to medical appointments. ([phone] ? Social Security Administration: May apply for disability if have a Stage IV cancer. 623-183-8641 402-381-1529 ? [employer], Disability and Transit Services: Assists with nutrition, care and transit needs. [place] Support Programs: @10RELATIVEDAYS @ > Cancer Support Group  2nd Tuesday of the month 1pm-2pm, Journey Room  > Creative Journey  3rd Tuesday of the month 1130am-1pm, Journey Room  > Look Good Feel Better  1st Wednesday of the month 10am-12 noon, Journey Room (Call American Cancer Society to register [phone])  Immune Globulin Injection What is this medicine? IMMUNE GLOBULIN (im MUNE GLOB yoo lin) helps to prevent or reduce the severity of certain infections in patients who are at risk. This medicine is collected from the pooled blood of many donors. It is used to treat immune system problems, thrombocytopenia, and Kawasaki syndrome. This medicine may be used for other purposes; ask your health care provider or pharmacist if you have questions. What should I tell my health care provider before I take this medicine? They need to know if you have any of these conditions: - diabetes - extremely low or no immune antibodies in the blood - heart disease - history of blood clots -  hyperprolinemia - infection in the blood, sepsis - kidney disease - taking medicine that may change kidney function - ask your health care provider about your medicine - an unusual or allergic reaction to human immune globulin, albumin, maltose, sucrose, polysorbate  80, other medicines, foods, dyes, or preservatives - pregnant or trying to get pregnant - breast-feeding How should I use this medicine? This medicine is for injection into a muscle or infusion into a vein or skin. It is usually given by a health care professional in a hospital or clinic setting. In rare cases, some brands of this medicine might be given at home. You will be taught how to give this medicine. Use exactly as directed. Take your medicine at regular intervals. Do not take your medicine more often than directed. Talk to your pediatrician regarding the use of this medicine in children. Special care may be needed. Overdosage: If you think you have taken too much of this medicine contact a poison control center or emergency room at once. NOTE: This medicine is only for you. Do not share this medicine with others. What if I miss a dose? It is important not to miss your dose. Call your doctor or health care professional if you are unable to keep an appointment. If you give yourself the medicine and you miss a dose, take it as soon as you can. If it is almost time for your next dose, take only that dose. Do not take double or extra doses. What may interact with this medicine? -aspirin and aspirin-like medicines -cisplatin -cyclosporine -medicines for infection like acyclovir, adefovir, amphotericin B, bacitracin, cidofovir, foscarnet, ganciclovir, gentamicin, pentamidine, vancomycin -NSAIDS, medicines for pain and inflammation, like ibuprofen or naproxen -pamidronate -vaccines -zoledronic acid This list may not describe all possible interactions. Give your health care provider a list of all the medicines, herbs, non-prescription drugs, or dietary supplements you use. Also tell them if you smoke, drink alcohol, or use illegal drugs. Some items may interact with your medicine. What should I watch for while using this medicine? Your condition will be monitored carefully while you are  receiving this medicine. This medicine is made from pooled blood donations of many different people. It may be possible to pass an infection in this medicine. However, the donors are screened for infections and all products are tested for HIV and hepatitis. The medicine is treated to kill most or all bacteria and viruses. Talk to your doctor about the risks and benefits of this medicine. Do not have vaccinations for at least 14 days before, or until at least 3 months after receiving this medicine. What side effects may I notice from receiving this medicine? Side effects that you should report to your doctor or health care professional as soon as possible: -allergic reactions like skin rash, itching or hives, swelling of the face, lips, or tongue -breathing problems -chest pain or tightness -fever, chills -headache with nausea, vomiting -neck pain or difficulty moving neck -pain when moving eyes -pain, swelling, warmth in the leg -problems with balance, talking, walking -sudden weight gain -swelling of the ankles, feet, hands -trouble passing urine or change in the amount of urine Side effects that usually do not require medical attention (report to your doctor or health care professional if they continue or are bothersome): -dizzy, drowsy -flushing -increased sweating -leg cramps -muscle aches and pains -pain at site where injected This list may not describe all possible side effects. Call your doctor for medical advice about side effects. You may  report side effects to FDA at 1-800-FDA-1088. Where should I keep my medicine? Keep out of the reach of children. This drug is usually given in a hospital or clinic and will not be stored at home. In rare cases, some brands of this medicine may be given at home. If you are using this medicine at home, you will be instructed on how to store this medicine. Throw away any unused medicine after the expiration date on the label. NOTE: This sheet is  a summary. It may not cover all possible information. If you have questions about this medicine, talk to your doctor, pharmacist, or health care provider.    2016, Elsevier/Gold Standard. (2008-06-05 11:44:49)

## 2015-10-03 NOTE — Progress Notes (Signed)
Pt's BP is low during 2nd increase. Pt states he feels a little light headed. Dr. Whitney Muse notified. Pt does this after getting benadryl prior to Rituxan infusions per tx room nurses. Dr. Whitney Muse said to continue with IVIG. 1059: pt c/o rt shoulder pain #4, chills, feeling heavy when breathing. Demerol 25mg  IV given @ 1102 by Forest Gleason RN. 1106: Vitals rechecked and stable (see vital f/s) chills/rigors are gone. Pt states his head is floating.  1212: IVIG restarted 1500: Patient's blood pressure low when sitting and standing (see VS sheet). Normal Saline 1088ml to be given over 2 hours.  1609: Pt's BP is stable. Pt's port needle removed and pt discharged home.

## 2015-10-07 ENCOUNTER — Encounter: Payer: Self-pay | Admitting: Dietician

## 2015-10-07 ENCOUNTER — Ambulatory Visit (HOSPITAL_COMMUNITY): Payer: Medicare Other | Admitting: Hematology & Oncology

## 2015-10-07 ENCOUNTER — Encounter (HOSPITAL_COMMUNITY): Payer: Self-pay | Admitting: Oncology

## 2015-10-07 ENCOUNTER — Encounter (HOSPITAL_BASED_OUTPATIENT_CLINIC_OR_DEPARTMENT_OTHER): Payer: Medicare Other | Admitting: Oncology

## 2015-10-07 ENCOUNTER — Inpatient Hospital Stay (HOSPITAL_COMMUNITY): Payer: Medicare Other

## 2015-10-07 ENCOUNTER — Encounter (HOSPITAL_COMMUNITY): Payer: Medicare Other

## 2015-10-07 VITALS — BP 91/50 | HR 75 | Temp 97.7°F | Resp 18

## 2015-10-07 VITALS — BP 94/51 | HR 70 | Temp 97.6°F | Resp 18 | Wt 212.0 lb

## 2015-10-07 DIAGNOSIS — Z5111 Encounter for antineoplastic chemotherapy: Secondary | ICD-10-CM | POA: Diagnosis not present

## 2015-10-07 DIAGNOSIS — Z23 Encounter for immunization: Secondary | ICD-10-CM | POA: Diagnosis not present

## 2015-10-07 DIAGNOSIS — C9110 Chronic lymphocytic leukemia of B-cell type not having achieved remission: Secondary | ICD-10-CM | POA: Diagnosis not present

## 2015-10-07 DIAGNOSIS — Z5112 Encounter for antineoplastic immunotherapy: Secondary | ICD-10-CM | POA: Diagnosis not present

## 2015-10-07 DIAGNOSIS — C8190 Hodgkin lymphoma, unspecified, unspecified site: Secondary | ICD-10-CM | POA: Diagnosis not present

## 2015-10-07 DIAGNOSIS — E790 Hyperuricemia without signs of inflammatory arthritis and tophaceous disease: Secondary | ICD-10-CM

## 2015-10-07 DIAGNOSIS — D801 Nonfamilial hypogammaglobulinemia: Secondary | ICD-10-CM

## 2015-10-07 DIAGNOSIS — R5383 Other fatigue: Secondary | ICD-10-CM

## 2015-10-07 LAB — COMPREHENSIVE METABOLIC PANEL
ALBUMIN: 3.1 g/dL — AB (ref 3.5–5.0)
ALT: 14 U/L — ABNORMAL LOW (ref 17–63)
ANION GAP: 4 — AB (ref 5–15)
AST: 22 U/L (ref 15–41)
Alkaline Phosphatase: 104 U/L (ref 38–126)
BUN: 12 mg/dL (ref 6–20)
CALCIUM: 8.2 mg/dL — AB (ref 8.9–10.3)
CHLORIDE: 110 mmol/L (ref 101–111)
CO2: 24 mmol/L (ref 22–32)
Creatinine, Ser: 1.4 mg/dL — ABNORMAL HIGH (ref 0.61–1.24)
GFR calc Af Amer: 57 mL/min — ABNORMAL LOW (ref >60)
GFR calc non Af Amer: 49 mL/min — ABNORMAL LOW (ref >60)
GLUCOSE: 130 mg/dL — AB (ref 65–99)
POTASSIUM: 3.8 mmol/L (ref 3.5–5.1)
SODIUM: 138 mmol/L (ref 135–145)
TOTAL PROTEIN: 5.7 g/dL — AB (ref 6.5–8.1)
Total Bilirubin: 0.7 mg/dL (ref 0.3–1.2)

## 2015-10-07 LAB — CBC WITH DIFFERENTIAL/PLATELET
BASOS ABS: 0.1 10*3/uL (ref 0.0–0.1)
BASOS PCT: 2 %
EOS PCT: 1 %
Eosinophils Absolute: 0 10*3/uL (ref 0.0–0.7)
HCT: 30.6 % — ABNORMAL LOW (ref 39.0–52.0)
Hemoglobin: 9.9 g/dL — ABNORMAL LOW (ref 13.0–17.0)
Lymphocytes Relative: 67 %
Lymphs Abs: 5 10*3/uL — ABNORMAL HIGH (ref 0.7–4.0)
MCH: 32 pg (ref 26.0–34.0)
MCHC: 32.4 g/dL (ref 30.0–36.0)
MCV: 99 fL (ref 78.0–100.0)
MONO ABS: 0.8 10*3/uL (ref 0.1–1.0)
Monocytes Relative: 10 %
NEUTROS ABS: 1.6 10*3/uL — AB (ref 1.7–7.7)
Neutrophils Relative %: 21 %
PLATELETS: 122 10*3/uL — AB (ref 150–400)
RBC: 3.09 MIL/uL — ABNORMAL LOW (ref 4.22–5.81)
RDW: 16.7 % — AB (ref 11.5–15.5)
WBC: 7.5 10*3/uL (ref 4.0–10.5)

## 2015-10-07 LAB — LACTATE DEHYDROGENASE: LDH: 175 U/L (ref 98–192)

## 2015-10-07 LAB — PHOSPHORUS: PHOSPHORUS: 3.2 mg/dL (ref 2.5–4.6)

## 2015-10-07 LAB — URIC ACID: Uric Acid, Serum: 9.5 mg/dL — ABNORMAL HIGH (ref 4.4–7.6)

## 2015-10-07 LAB — MAGNESIUM: MAGNESIUM: 1.9 mg/dL (ref 1.7–2.4)

## 2015-10-07 LAB — SEDIMENTATION RATE: Sed Rate: 40 mm/hr — ABNORMAL HIGH (ref 0–16)

## 2015-10-07 MED ORDER — ACETAMINOPHEN 325 MG PO TABS
650.0000 mg | ORAL_TABLET | Freq: Once | ORAL | Status: AC
Start: 2015-10-07 — End: 2015-10-07
  Administered 2015-10-07: 650 mg via ORAL
  Filled 2015-10-07: qty 2

## 2015-10-07 MED ORDER — DIPHENHYDRAMINE HCL 25 MG PO CAPS
50.0000 mg | ORAL_CAPSULE | Freq: Once | ORAL | Status: AC
  Administered 2015-10-07: 50 mg via ORAL
  Filled 2015-10-07: qty 2

## 2015-10-07 MED ORDER — HEPARIN SOD (PORK) LOCK FLUSH 100 UNIT/ML IV SOLN
500.0000 [IU] | Freq: Once | INTRAVENOUS | Status: AC | PRN
  Administered 2015-10-07: 500 [IU]
  Filled 2015-10-07: qty 5

## 2015-10-07 MED ORDER — PALONOSETRON HCL INJECTION 0.25 MG/5ML
0.2500 mg | Freq: Once | INTRAVENOUS | Status: AC
Start: 2015-10-07 — End: 2015-10-07
  Administered 2015-10-07: 0.25 mg via INTRAVENOUS
  Filled 2015-10-07: qty 5

## 2015-10-07 MED ORDER — SODIUM CHLORIDE 0.9 % IV SOLN
Freq: Once | INTRAVENOUS | Status: AC
  Administered 2015-10-07: 10:00:00 via INTRAVENOUS

## 2015-10-07 MED ORDER — SODIUM CHLORIDE 0.9 % IV SOLN
90.0000 mg/m2 | Freq: Once | INTRAVENOUS | Status: AC
  Administered 2015-10-07: 200 mg via INTRAVENOUS
  Filled 2015-10-07: qty 8

## 2015-10-07 MED ORDER — SODIUM CHLORIDE 0.9% FLUSH: 10.0000 mL | INTRAVENOUS | Status: DC | PRN

## 2015-10-07 MED ORDER — SODIUM CHLORIDE 0.9 % IV SOLN
6.0000 mg | Freq: Once | INTRAVENOUS | Status: AC
  Administered 2015-10-07: 6 mg via INTRAVENOUS
  Filled 2015-10-07: qty 4

## 2015-10-07 MED ORDER — SODIUM CHLORIDE 0.9 % IV SOLN
10.0000 mg | Freq: Once | INTRAVENOUS | Status: AC
  Administered 2015-10-07: 10 mg via INTRAVENOUS
  Filled 2015-10-07: qty 1

## 2015-10-07 MED ORDER — SODIUM CHLORIDE 0.9 % IV SOLN
500.0000 mg/m2 | Freq: Once | INTRAVENOUS | Status: AC
  Administered 2015-10-07: 1100 mg via INTRAVENOUS
  Filled 2015-10-07: qty 10

## 2015-10-07 NOTE — Assessment & Plan Note (Signed)
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    Due to recurrent URI, he was started on monthly IVIG.  Oncology Flowsheet 10/03/2015  Immune Globulin 10% (PRIVIGEN) IV 0.4 g/kg   This supportive therapy plan is built and reviewed.

## 2015-10-07 NOTE — Patient Instructions (Signed)
[place] at [facility] Discharge Instructions  RECOMMENDATIONS MADE BY THE CONSULTANT AND ANY TEST RESULTS WILL BE SENT TO YOUR REFERRING PHYSICIAN.  You were seen by [name] today and  Received Rasburicase. Continue weekly labs and return for  A follow up in 4 weeks Please call the center with any concerns.   Thank you for choosing [place] at [facility] to provide your oncology and hematology care.  To afford each patient quality time with our provider, please arrive at least 15 minutes before your scheduled appointment time.   Beginning January 23rd 2017 lab work for the [facility] will be done in the  Main lab at [facility] on 1st floor. If you have a lab appointment with the [facility] please come in thru the  Main Entrance and check in at the main information desk  You need to re-schedule your appointment should you arrive 10 or more minutes late.  We strive to give you quality time with our providers, and arriving late affects you and other patients whose appointments are after yours.  Also, if you no show three or more times for appointments you may be dismissed from the clinic at the providers discretion.     Again, thank you for choosing [facility].  Our hope is that these requests will decrease the amount of time that you wait before being seen by our physicians.       _____________________________________________________________  Should you have questions after your visit to [facility], please contact our office at [phone] between the hours of 8:30 a.m. and 4:30 p.m.  Voicemails left after 4:30 p.m. will not be returned until the following business day.  For prescription refill requests, have your pharmacy contact our office.         Resources For Cancer Patients and their Caregivers ? American Cancer Society: Can assist with transportation, wigs, general needs, runs Look Good Feel  Better.        [phone] ? Cancer Care: Provides financial assistance, online support groups, medication/co-pay assistance.  1-800-813-HOPE [phone]) ? [place] Assists [place] cancer patients and their families through emotional , educational and financial support.  517-629-0005 ? [place] DSS Where to apply for food stamps, Medicaid and utility assistance. 3398336915 ? RCATS: Transportation to medical appointments. (747)705-6416 ? Social Security Administration: May apply for disability if have a Stage IV cancer. [phone] ? [employer], Disability and Transit Services: Assists with nutrition, care and transit needs. [place] Support Programs: @10RELATIVEDAYS @ > Cancer Support Group  2nd Tuesday of the month 1pm-2pm, Journey Room  > Creative Journey  3rd Tuesday of the month 1130am-1pm, Journey Room  > Look Good Feel Better  1st Wednesday of the month 10am-12 noon, Journey Room (Call [place] to register [phone])

## 2015-10-07 NOTE — Patient Instructions (Signed)
[facility] Discharge Instructions for Patients Receiving Chemotherapy   Beginning January 23rd 2017 lab work for the [facility] will be done in the  Main lab at [facility] on 1st floor. If you have a lab appointment with the [place] please come in thru the  Main Entrance and check in at the main information desk   Today you received the following chemotherapy agents:  bendamustine and Rituxan. If you develop nausea and vomiting, or diarrhea that is not controlled by your medication, call the clinic.  The clinic phone number is [phone]. Office hours are Monday-Friday 8:30am-5:00pm.  BELOW ARE SYMPTOMS THAT SHOULD BE REPORTED IMMEDIATELY:  *FEVER GREATER THAN 101.0 F  *CHILLS WITH OR WITHOUT FEVER  NAUSEA AND VOMITING THAT IS NOT CONTROLLED WITH YOUR NAUSEA MEDICATION  *UNUSUAL SHORTNESS OF BREATH  *UNUSUAL BRUISING OR BLEEDING  TENDERNESS IN MOUTH AND THROAT WITH OR WITHOUT PRESENCE OF ULCERS  *URINARY PROBLEMS  *BOWEL PROBLEMS  UNUSUAL RASH Items with * indicate a potential emergency and should be followed up as soon as possible. If you have an emergency after office hours please contact your primary care physician or go to the nearest emergency department.  Please call the clinic during office hours if you have any questions or concerns.   You may also contact the Patient Navigator at [phone] should you have any questions or need assistance in obtaining follow up care.      Resources For Cancer Patients and their Caregivers ? American Cancer Society: Can assist with transportation, wigs, general needs, runs Look Good Feel Better.        [phone] ? Cancer Care: Provides financial assistance, online support groups, medication/co-pay assistance.  1-800-813-HOPE 206-684-7523) ? [place] Assists [place] cancer patients and their families through emotional , educational and financial support.   209-191-0669 ? [place] DSS Where to apply for food stamps, Medicaid and utility assistance. 819-147-9501 ? RCATS: Transportation to medical appointments. 250-549-0277 ? Social Security Administration: May apply for disability if have a Stage IV cancer. [phone] 762-780-4245 ? [employer], Disability and Transit Services: Assists with nutrition, care and transit needs. [phone]

## 2015-10-07 NOTE — Progress Notes (Signed)
[name], MD [address] [place] [address]  Leukemia, lymphocytic, chronic [facility]) - Plan: CBC with Differential, Comprehensive metabolic panel, Phosphorus, Uric acid  Hypogammaglobulinemia, acquired ([place])  Hyperuricemia - Plan: rasburicase (ELITEK) 6 mg in sodium chloride 0.9 % 46 mL IVPB, Phosphorus, Uric acid  CURRENT THERAPY: Bendamustine/Rituxan beginning on 09/08/2015  INTERVAL HISTORY: [name] 70 y.o. 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  Now on systemic chemotherapy. AND History of Hodgkin's Lymphoma, treated in 2006. AND Hypogammaglobulenemia, started IVIG on 10/03/2015 due to recurrent URI. AND CKD, Stage III      Hodgkin's disease ([place])   10/02/2007 Initial Diagnosis HODGKIN'S DISEASE    Leukemia, lymphocytic, chronic ([place])   07/22/2015 Procedure IR bone marrow aspiration and biopsy   07/23/2015 Pathology Results Bone Marrow, Aspirate,Biopsy, and Clot, left iliac crest - EXTENSIVE INVOLVEMENT BY CHRONIC LYMPHOCYTIC LEUKEMIA. PERIPHERAL BLOOD: - CHRONIC LYMPHOCYTIC LEUKEMIA. - NORMOCYTIC ANEMIA. - THROMBOCYTOPENIA.   07/23/2015 Pathology Results Bone Marrow Flow Cytometry - CHRONIC LYMPHOCYTIC LEUKEMIA   08/20/2015 Imaging CT CAP- Mediastinal and hilar lymphadenopathy consistent with CLL. No pulmonary lesions. Marked splenomegaly. Mild upper abdominal retroperitoneal adenopathy.   08/27/2015 Miscellaneous Imbruvica therapy is cost-prohibitive.   09/08/2015 -  Chemotherapy Bendamustine day 1+2 every 28 days and Rituxan Day 1 every 28 days.   10/03/2015 Survivorship Monthly IVIG for recurrent URI in the setting of hypogammaglobulinemia.    [name] tolerated his first cycle of chemotherapy reasonably well.  His biggest issue was fatigue which is likely secondary to his disease burden and lack of reserve going into  treatment as a result.  He denies any nausea or vomiting since being seen last.  Fatigue continues to be an issue for him to today.  He reports "nightmares" associated with Levaquin antibiotic for URI.  As a result, he did not complete the prescribed course.  From a URI perspective, he is improved.  He is afebrile today and notes no fevers at home.  He still has some green sputum production, but he reports this too is improved.  He was started on monthly IVIG for recurrent URI beginning last week; he tolerated this infusion well too.    Given his continue hyperuricemia, I will check weekly labs moving forward until better controlled with Rasburicase.  Review of Systems  Constitutional: Positive for weight loss and malaise/fatigue. Negative for fever and chills.  HENT: Negative.   Eyes: Negative.   Respiratory: Positive for cough (significantly improved) and sputum production (improving).   Cardiovascular: Negative.   Gastrointestinal: Negative.  Negative for nausea, vomiting, diarrhea and constipation.  Genitourinary: Negative.   Musculoskeletal: Negative.   Skin: Negative.   Neurological: Negative.   Endo/Heme/Allergies: Negative.   Psychiatric/Behavioral: Negative.     Past Medical History  Diagnosis Date  . Aortic stenosis   . Atrial fibrillation ([place])   . Coronary atherosclerosis of native coronary artery   . Hyperlipidemia   . Essential hypertension, benign   . BPH (benign prostatic hypertrophy)   . Arthritis   . Acid reflux   . Hypogammaglobulinemia ([place])   . Pre-diabetes 08/04/2011  . CLL (chronic lymphoblastic leukemia)   . Hodgkin's disease   . Kidney stones   . Wears glasses   . Wears dentures   . HOH (hard of hearing)   . Indirect inguinal hernia 04/13/2013    Past Surgical History  Procedure Laterality Date  . Aortic valve replacement  10/09    30mm Magna Pericardial   . Esophagogastroduodenoscopy      scrapping of throat and stretching  . Lithotripsy    .  Bone marrow aspiration    . Portacath placement    . 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 Colon surgery    . Dental surgery  08/2012  . Multiple tooth extractions    . Mass excision N/A 12/05/2012    Procedure: EXCISION MIDLINE NECK MASS;  Surgeon: Ascencion Dike, MD;  Location: [facility];  Service: ENT;  Laterality: N/A;  . Tongue biopsy  12/2012  . Cataract extraction w/phaco Right 01/07/2014    Procedure: CATARACT EXTRACTION PHACO AND INTRAOCULAR LENS PLACEMENT (IOC);  Surgeon: Tonny Branch, MD;  Location: [facility];  Service: Ophthalmology;  Laterality: Right;  CDE:15.31  . Eye surgery Right 02/07/2014    cataract  . Cataract extraction w/phaco Left 03/25/2014    Procedure: CATARACT EXTRACTION PHACO AND INTRAOCULAR LENS PLACEMENT (IOC);  Surgeon: Tonny Branch, MD;  Location: [facility];  Service: Ophthalmology;  Laterality: Left;  CDE 6.55    Family History  Problem Relation Age of Onset  . Heart failure Mother   . Blindness Mother   . Cancer Father 83    Lung   . Cancer Brother 28    acute leukemia     Social History   Social History  . Marital Status: Widowed    Spouse Name: N/A  . Number of Children: N/A  . Years of Education: N/A   Social History Main Topics  . Smoking status: Never Smoker   . Smokeless tobacco: Never Used     Comment: pt denies tobacco use   . Alcohol Use: No     Comment: pt denies alcohol   . Drug Use: No  . Sexual Activity: Not Currently   Other Topics Concern  . None   Social History Narrative     PHYSICAL EXAMINATION  ECOG PERFORMANCE STATUS: 1 - Symptomatic but completely ambulatory  Filed Vitals:   10/07/15 0820 10/07/15 1456  BP: 125/61 94/51  Pulse: 78 70  Temp: 97.5 F (36.4 C) 97.6 F (36.4 C)  Resp: 20 18    GENERAL:alert, no distress, well nourished, well developed, comfortable, cooperative, obese and unaccompanied, his usual self, slow to answer questions, smelling of feline urine. SKIN: skin color,  texture, turgor are normal, no rashes or significant lesions HEAD: Normocephalic, No masses, lesions, tenderness or abnormalities EYES: normal, EOMI, Conjunctiva are pink and non-injected EARS: External ears normal OROPHARYNX:lips, buccal mucosa, and tongue normal and mucous membranes are moist  NECK: supple, trachea midline LYMPH:  not examined BREAST:not examined LUNGS: clear to auscultation and percussion HEART: regular rate & rhythm, no murmurs and no gallops ABDOMEN:abdomen soft, non-tender, obese and normal bowel sounds BACK: Back symmetric, no curvature. EXTREMITIES:less then 2 second capillary refill, no joint deformities, effusion, or inflammation, no skin discoloration, no cyanosis  NEURO: alert & oriented x 3 with fluent speech, no focal motor/sensory deficits, gait normal   LABORATORY DATA: CBC    Component Value Date/Time   WBC 7.5 10/07/2015 0831   RBC 3.09* 10/07/2015 0831   RBC 3.42* 04/18/2015 1347   HGB 9.9* 10/07/2015 0831   HCT 30.6* 10/07/2015 0831   PLT 122* 10/07/2015 0831   MCV 99.0 10/07/2015 0831   MCH 32.0 10/07/2015 0831   MCHC 32.4 10/07/2015 0831  RDW 16.7* 10/07/2015 0831   LYMPHSABS 5.0* 10/07/2015 0831   MONOABS 0.8 10/07/2015 0831   EOSABS 0.0 10/07/2015 0831   BASOSABS 0.1 10/07/2015 0831      Chemistry      Component Value Date/Time   NA 138 10/07/2015 0831   K 3.8 10/07/2015 0831   CL 110 10/07/2015 0831   CO2 24 10/07/2015 0831   BUN 12 10/07/2015 0831   CREATININE 1.40* 10/07/2015 0831   CREATININE 1.59* 06/10/2015 1601      Component Value Date/Time   CALCIUM 8.2* 10/07/2015 0831   ALKPHOS 104 10/07/2015 0831   AST 22 10/07/2015 0831   ALT 14* 10/07/2015 0831   BILITOT 0.7 10/07/2015 0831        PENDING LABS:   RADIOGRAPHIC STUDIES:  Dg Chest 1 View  09/25/2015  CLINICAL DATA:  Productive cough, congestion for 1 week EXAM: CHEST 1 VIEW COMPARISON:  08/20/2015 FINDINGS: Cardiomediastinal silhouette is stable. The  patient is status post median sternotomy and aortic valve replacement. No infiltrate or pulmonary edema. Right subclavian Port-A-Cath with tip in SVC. No pneumothorax. IMPRESSION: No active disease.  Right subclavian Port-A-Cath in place. Electronically Signed   By: Lahoma Crocker M.D.   On: 09/25/2015 09:52     PATHOLOGY:    ASSESSMENT AND PLAN:  Leukemia, lymphocytic, chronic (HCC) CLL, requiring treatment due to extensive involvement of CLL in bone marrow aspiration and biopsy in addition to climbing WBC and thrombocytopenia.  He began Bendamustine/Rituxan on 09/08/2015.  Oncology history is updated.  Labs today: CBC diff, CMET, phosphorus, uric acid.  I personally reviewed and went over laboratory results with the patient.  The results are noted within this dictation.  His labs meet treatment criteria and are actually much improved since initiation of treatment.  He has required four doses of Rasburicase for hyperuricemia secondary to tumor lysis, to-date.  Today, hyperuricemia is again noted.  Order is placed for Rasburicase today and signed and held.  This is also communicated to his treating nurse.  Labs satisfy treatment criteria today and therefore, he will embark on cycle #2.  He will receive Neulasta OnPro on Day 2 of cycle #2.  Weekly labs: CBC diff, CMET, phosphorus, and uric acid.  Will provide additional Rasburicase if indicated.  He notes continued fatigue, although he does appear better clinically.    He was started on IVIG monthly last week given his hypogammaglobulinemia and recurrent URI.  He admits that he did not complete his Levaquin Rx due to "nightmares."  His URI is improved and he denies any fevers at home.  He is afebrile today.   Return in 4 weeks for follow-up.  Hypogammaglobulinemia, acquired ([place]) Hypogammaglobulinemia, secondary to CLL and past treatment of Hodgkin's Lymphoma.    Due to recurrent URI, he was started on monthly IVIG.  Oncology Flowsheet 10/03/2015   Immune Globulin 10% (PRIVIGEN) IV 0.4 g/kg   This supportive therapy plan is built and reviewed.      ORDERS PLACED FOR THIS ENCOUNTER: Orders Placed This Encounter  Procedures  . CBC with Differential  . Comprehensive metabolic panel  . Phosphorus  . Uric acid    MEDICATIONS PRESCRIBED THIS ENCOUNTER: No orders of the defined types were placed in this encounter.    THERAPY PLAN:  Continue treatment as planned.  All questions were answered. The patient knows to call the clinic with any problems, questions or concerns. We can certainly see the patient much sooner if necessary.  Patient and  plan discussed with Dr. Ancil Linsey and she is in agreement with the aforementioned.   This note is electronically signed by: Doy Mince 10/07/2015 4:02 PM

## 2015-10-07 NOTE — Assessment & Plan Note (Addendum)
CLL, requiring treatment due to extensive involvement of CLL in bone marrow aspiration and biopsy in addition to climbing WBC and thrombocytopenia.  He began Bendamustine/Rituxan on 09/08/2015.  Oncology history is updated.  Labs today: CBC diff, CMET, phosphorus, uric acid.  I personally reviewed and went over laboratory results with the patient.  The results are noted within this dictation.  His labs meet treatment criteria and are actually much improved since initiation of treatment.  He has required four doses of Rasburicase for hyperuricemia secondary to tumor lysis, to-date.  Today, hyperuricemia is again noted.  Order is placed for Rasburicase today and signed and held.  This is also communicated to his treating nurse.  Labs satisfy treatment criteria today and therefore, he will embark on cycle #2.  He will receive Neulasta OnPro on Day 2 of cycle #2.  Weekly labs: CBC diff, CMET, phosphorus, and uric acid.  Will provide additional Rasburicase if indicated.  He notes continued fatigue, although he does appear better clinically.    He was started on IVIG monthly last week given his hypogammaglobulinemia and recurrent URI.  He admits that he did not complete his Levaquin Rx due to "nightmares."  His URI is improved and he denies any fevers at home.  He is afebrile today.   Return in 4 weeks for follow-up.

## 2015-10-07 NOTE — Progress Notes (Signed)
RD consulted for weight loss by Oncology PA.   Contacted Pt by visiting during his infusion.   Wt Readings from Last 10 Encounters:  10/07/15 212 lb (96.163 kg)  10/03/15 216 lb (97.977 kg)  09/25/15 215 lb 12.8 oz (97.886 kg)  09/24/15 212 lb 11.2 oz (96.48 kg)  09/10/15 228 lb (103.42 kg)  09/08/15 222 lb 12.8 oz (101.061 kg)  09/01/15 226 lb 6.4 oz (102.694 kg)  08/29/15 223 lb (101.152 kg)  08/15/15 226 lb (102.513 kg)  07/22/15 232 lb (105.235 kg)   Patient weight has decreased by 30 lbs in the last 6 months.   Patient reports oral intake as worse than baseline and is suffering from symptoms including fatigue and poor appetite.  His current eating pattern consists of a morning meal, a late afternoon meal and a bedtime snack. He says this is the same pattern he has always had, but notices he is eating less at each meal.    Educated that because pt is eating less, he needs to make the most of each bite. RD went through dietary recall and either gave approval or detailed how to increase kcal/pro content of pts meals or the food items reported. For example, pt drinks mostly water. Asked him to either drink the water before/after meals or drink a calorie containing beverage instead. A large intake of water is not reccommended for weight maintenance.   RD listed "Big three": cheese, PB, and eggs. These foods all contain high kcal/pro. He does not like eggs but is willing to incorporate more cheese/PB into his diet.   Pt has fairly poor health literacy and do not believe patient truly understood the recommendations provided even though he said he did. This is a patient that would likely benefit most from an appetite stimulant as he largely uses his appetite to decided whether or not he will eat more. Did try to explain the idea that food is medicine and he needs to takes a couple extra bites even if he is not hungry.   Left my contact info, coupons,  and handouts titled "Increasing Calories  and Protein"  Patient is educated he can ask for or call RD whenever he would like.   Burtis Junes RD, LDN, [facility] Pager: [phone] 10/07/2015 10:53 AM

## 2015-10-07 NOTE — Progress Notes (Signed)
Tolerated tx w/o adverse reaction.  A&Ox4, in no distress.  VSS.  Discharged ambulatory in c/o son for tranport home.

## 2015-10-08 ENCOUNTER — Encounter (HOSPITAL_BASED_OUTPATIENT_CLINIC_OR_DEPARTMENT_OTHER): Payer: Medicare Other

## 2015-10-08 ENCOUNTER — Inpatient Hospital Stay (HOSPITAL_COMMUNITY): Payer: Medicare Other

## 2015-10-08 VITALS — BP 91/47 | HR 76 | Temp 98.0°F | Resp 18

## 2015-10-08 DIAGNOSIS — Z5111 Encounter for antineoplastic chemotherapy: Secondary | ICD-10-CM | POA: Diagnosis not present

## 2015-10-08 DIAGNOSIS — C9110 Chronic lymphocytic leukemia of B-cell type not having achieved remission: Secondary | ICD-10-CM

## 2015-10-08 DIAGNOSIS — Z5189 Encounter for other specified aftercare: Secondary | ICD-10-CM

## 2015-10-08 MED ORDER — PEGFILGRASTIM 6 MG/0.6ML ~~LOC~~ PSKT
6.0000 mg | PREFILLED_SYRINGE | Freq: Once | SUBCUTANEOUS | Status: AC
  Administered 2015-10-08: 6 mg via SUBCUTANEOUS
  Filled 2015-10-08: qty 0.6

## 2015-10-08 MED ORDER — HEPARIN SOD (PORK) LOCK FLUSH 100 UNIT/ML IV SOLN
500.0000 [IU] | Freq: Once | INTRAVENOUS | Status: AC | PRN
  Administered 2015-10-08: 500 [IU]
  Filled 2015-10-08: qty 5

## 2015-10-08 MED ORDER — SODIUM CHLORIDE 0.9 % IV SOLN
90.0000 mg/m2 | Freq: Once | INTRAVENOUS | Status: AC
  Administered 2015-10-08: 200 mg via INTRAVENOUS
  Filled 2015-10-08: qty 8

## 2015-10-08 MED ORDER — DEXAMETHASONE SODIUM PHOSPHATE 100 MG/10ML IJ SOLN
10.0000 mg | Freq: Once | INTRAMUSCULAR | Status: AC
  Administered 2015-10-08: 10 mg via INTRAVENOUS
  Filled 2015-10-08: qty 1

## 2015-10-08 MED ORDER — SODIUM CHLORIDE 0.9% FLUSH: 10.0000 mL | INTRAVENOUS | Status: DC | PRN

## 2015-10-08 MED ORDER — SODIUM CHLORIDE 0.9 % IV SOLN
Freq: Once | INTRAVENOUS | Status: AC
  Administered 2015-10-08: 12:00:00 via INTRAVENOUS

## 2015-10-08 NOTE — Patient Instructions (Signed)
[facility] Discharge Instructions for Patients Receiving Chemotherapy   Beginning January 23rd 2017 lab work for the [facility] will be done in the  Main lab at [facility] on 1st floor. If you have a lab appointment with the [facility] please come in thru the  Main Entrance and check in at the main information desk   Today you received the following chemotherapy agents:  Bendamustine  If you develop nausea and vomiting, or diarrhea that is not controlled by your medication, call the clinic.  The clinic phone number is [phone]. Office hours are Monday-Friday 8:30am-5:00pm.  BELOW ARE SYMPTOMS THAT SHOULD BE REPORTED IMMEDIATELY:  *FEVER GREATER THAN 101.0 F  *CHILLS WITH OR WITHOUT FEVER  NAUSEA AND VOMITING THAT IS NOT CONTROLLED WITH YOUR NAUSEA MEDICATION  *UNUSUAL SHORTNESS OF BREATH  *UNUSUAL BRUISING OR BLEEDING  TENDERNESS IN MOUTH AND THROAT WITH OR WITHOUT PRESENCE OF ULCERS  *URINARY PROBLEMS  *BOWEL PROBLEMS  UNUSUAL RASH Items with * indicate a potential emergency and should be followed up as soon as possible. If you have an emergency after office hours please contact your primary care physician or go to the nearest emergency department.  Please call the clinic during office hours if you have any questions or concerns.   You may also contact the Patient Navigator at [phone] should you have any questions or need assistance in obtaining follow up care.      Resources For Cancer Patients and their Caregivers ? American Cancer Society: Can assist with transportation, wigs, general needs, runs Look Good Feel Better.        754-237-7986 ? Cancer Care: Provides financial assistance, online support groups, medication/co-pay assistance.  1-800-813-HOPE 972 498 7647) ? [facility] Assists [place] cancer patients and their families through emotional , educational and financial support.   778-492-3973 ? [place] DSS Where to apply for food stamps, Medicaid and utility assistance. 830-587-1710 ? RCATS: Transportation to medical appointments. 607 679 9746 ? Social Security Administration: May apply for disability if have a Stage IV cancer. (430)452-4058 252-512-4688 ? [employer], Disability and Transit Services: Assists with nutrition, care and transit needs. 5138753952

## 2015-10-14 ENCOUNTER — Other Ambulatory Visit: Payer: Self-pay | Admitting: Cardiology

## 2015-10-15 MED ORDER — SULFAMETHOXAZOLE-TRIMETHOPRIM 800-160 MG PO TABS: 1.0000 | ORAL_TABLET | ORAL | Status: DC

## 2015-10-21 ENCOUNTER — Telehealth (HOSPITAL_COMMUNITY): Payer: Self-pay | Admitting: *Deleted

## 2015-10-21 NOTE — Telephone Encounter (Signed)
Patient says he has extreme fatigue. He had chemo 2 weeks ago and states it has never taken him this long to get over it. He feels nauseated at times but is controlling it with nausea med. He is still drinking and eating. Also complains of cough and states the cough med he was prescribed made him feel worse and have "crazy dreams" so he stopped taking it. He wants to go to his grandsons first birthday party in [place] this weekend and will have to fly. His biggest complaint is the fatigue.

## 2015-10-22 NOTE — Telephone Encounter (Signed)
Noted  

## 2015-10-31 ENCOUNTER — Encounter (HOSPITAL_COMMUNITY): Payer: Medicare Other | Attending: Oncology

## 2015-10-31 ENCOUNTER — Other Ambulatory Visit (HOSPITAL_COMMUNITY): Payer: Self-pay | Admitting: Oncology

## 2015-10-31 ENCOUNTER — Encounter (HOSPITAL_COMMUNITY): Payer: Self-pay

## 2015-10-31 VITALS — BP 115/53 | HR 71 | Temp 97.8°F | Resp 18 | Wt 212.3 lb

## 2015-10-31 DIAGNOSIS — C9110 Chronic lymphocytic leukemia of B-cell type not having achieved remission: Secondary | ICD-10-CM | POA: Diagnosis not present

## 2015-10-31 DIAGNOSIS — D801 Nonfamilial hypogammaglobulinemia: Secondary | ICD-10-CM | POA: Diagnosis not present

## 2015-10-31 DIAGNOSIS — Z23 Encounter for immunization: Secondary | ICD-10-CM | POA: Insufficient documentation

## 2015-10-31 DIAGNOSIS — C8190 Hodgkin lymphoma, unspecified, unspecified site: Secondary | ICD-10-CM | POA: Insufficient documentation

## 2015-10-31 DIAGNOSIS — E790 Hyperuricemia without signs of inflammatory arthritis and tophaceous disease: Secondary | ICD-10-CM

## 2015-10-31 DIAGNOSIS — Z95828 Presence of other vascular implants and grafts: Secondary | ICD-10-CM

## 2015-10-31 LAB — COMPREHENSIVE METABOLIC PANEL
ALT: 13 U/L — ABNORMAL LOW (ref 17–63)
ANION GAP: 5 (ref 5–15)
AST: 24 U/L (ref 15–41)
Albumin: 3 g/dL — ABNORMAL LOW (ref 3.5–5.0)
Alkaline Phosphatase: 96 U/L (ref 38–126)
BILIRUBIN TOTAL: 0.7 mg/dL (ref 0.3–1.2)
BUN: 11 mg/dL (ref 6–20)
CHLORIDE: 110 mmol/L (ref 101–111)
CO2: 24 mmol/L (ref 22–32)
Calcium: 8 mg/dL — ABNORMAL LOW (ref 8.9–10.3)
Creatinine, Ser: 1.24 mg/dL (ref 0.61–1.24)
GFR, EST NON AFRICAN AMERICAN: 57 mL/min — AB (ref >60)
Glucose, Bld: 110 mg/dL — ABNORMAL HIGH (ref 65–99)
POTASSIUM: 3.8 mmol/L (ref 3.5–5.1)
Sodium: 139 mmol/L (ref 135–145)
TOTAL PROTEIN: 5.1 g/dL — AB (ref 6.5–8.1)

## 2015-10-31 LAB — MAGNESIUM: MAGNESIUM: 1.9 mg/dL (ref 1.7–2.4)

## 2015-10-31 LAB — CBC WITH DIFFERENTIAL/PLATELET
BASOS ABS: 0.1 10*3/uL (ref 0.0–0.1)
Basophils Relative: 2 %
EOS PCT: 4 %
Eosinophils Absolute: 0.2 10*3/uL (ref 0.0–0.7)
HEMATOCRIT: 27.7 % — AB (ref 39.0–52.0)
Hemoglobin: 9.3 g/dL — ABNORMAL LOW (ref 13.0–17.0)
LYMPHS ABS: 1.9 10*3/uL (ref 0.7–4.0)
Lymphocytes Relative: 48 %
MCH: 33.3 pg (ref 26.0–34.0)
MCHC: 33.6 g/dL (ref 30.0–36.0)
MCV: 99.3 fL (ref 78.0–100.0)
MONO ABS: 0.3 10*3/uL (ref 0.1–1.0)
MONOS PCT: 8 %
Neutro Abs: 1.5 10*3/uL — ABNORMAL LOW (ref 1.7–7.7)
Neutrophils Relative %: 38 %
PLATELETS: 67 10*3/uL — AB (ref 150–400)
RBC: 2.79 MIL/uL — ABNORMAL LOW (ref 4.22–5.81)
RDW: 18.2 % — AB (ref 11.5–15.5)
WBC: 4 10*3/uL (ref 4.0–10.5)

## 2015-10-31 LAB — PHOSPHORUS: PHOSPHORUS: 3.3 mg/dL (ref 2.5–4.6)

## 2015-10-31 LAB — URIC ACID: URIC ACID, SERUM: 8.4 mg/dL — AB (ref 4.4–7.6)

## 2015-10-31 LAB — LACTATE DEHYDROGENASE: LDH: 230 U/L — ABNORMAL HIGH (ref 98–192)

## 2015-10-31 LAB — SEDIMENTATION RATE: SED RATE: 25 mm/h — AB (ref 0–16)

## 2015-10-31 MED ORDER — HEPARIN SOD (PORK) LOCK FLUSH 100 UNIT/ML IV SOLN: 500.0000 [IU] | Freq: Once | INTRAVENOUS | Status: DC

## 2015-10-31 MED ORDER — DIPHENHYDRAMINE HCL 25 MG PO CAPS
25.0000 mg | ORAL_CAPSULE | Freq: Once | ORAL | Status: AC
Start: 2015-10-31 — End: 2015-10-31
  Administered 2015-10-31: 25 mg via ORAL

## 2015-10-31 MED ORDER — IMMUNE GLOBULIN (HUMAN) 20 GM/200ML IV SOLN
0.4000 g/kg | Freq: Once | INTRAVENOUS | Status: AC
  Administered 2015-10-31: 40 g via INTRAVENOUS
  Filled 2015-10-31: qty 400

## 2015-10-31 MED ORDER — SODIUM CHLORIDE 0.9 % IV SOLN: Freq: Once | INTRAVENOUS | Status: DC

## 2015-10-31 MED ORDER — DIPHENHYDRAMINE HCL 25 MG PO TABS
25.0000 mg | ORAL_TABLET | Freq: Once | ORAL | Status: DC
  Filled 2015-10-31: qty 1

## 2015-10-31 MED ORDER — HEPARIN SOD (PORK) LOCK FLUSH 100 UNIT/ML IV SOLN
500.0000 [IU] | Freq: Once | INTRAVENOUS | Status: AC
  Administered 2015-10-31: 500 [IU] via INTRAVENOUS

## 2015-10-31 MED ORDER — DEXTROSE 5 % IV SOLN
INTRAVENOUS | Status: DC
  Administered 2015-10-31: 10:00:00 via INTRAVENOUS

## 2015-10-31 MED ORDER — ACETAMINOPHEN 325 MG PO TABS
650.0000 mg | ORAL_TABLET | Freq: Four times a day (QID) | ORAL | Status: DC | PRN
  Administered 2015-10-31: 650 mg via ORAL

## 2015-10-31 NOTE — Progress Notes (Signed)
1210:  Tolerated infusion w/o adverse reaction.  Alert, in no distress.  VSS.  Discharged ambulatory in c/o son.

## 2015-10-31 NOTE — Patient Instructions (Signed)
[facility] at [facility] Discharge Instructions  RECOMMENDATIONS MADE BY THE CONSULTANT AND ANY TEST RESULTS WILL BE SENT TO YOUR REFERRING PHYSICIAN.  IVIG infusion today. Return as scheduled for chemotherapy next week.  Thank you for choosing [facility] at [facility] to provide your oncology and hematology care.  To afford each patient quality time with our provider, please arrive at least 15 minutes before your scheduled appointment time.   Beginning January 23rd 2017 lab work for the [facility] will be done in the  Main lab at [facility] on 1st floor. If you have a lab appointment with the [facility] please come in thru the  Main Entrance and check in at the main information desk  You need to re-schedule your appointment should you arrive 10 or more minutes late.  We strive to give you quality time with our providers, and arriving late affects you and other patients whose appointments are after yours.  Also, if you no show three or more times for appointments you may be dismissed from the clinic at the providers discretion.     Again, thank you for choosing [facility].  Our hope is that these requests will decrease the amount of time that you wait before being seen by our physicians.       _____________________________________________________________  Should you have questions after your visit to [facility], please contact our office at [phone] between the hours of 8:30 a.m. and 4:30 p.m.  Voicemails left after 4:30 p.m. will not be returned until the following business day.  For prescription refill requests, have your pharmacy contact our office.         Resources For Cancer Patients and their Caregivers ? American Cancer Society: Can assist with transportation, wigs, general needs, runs Look Good Feel Better.        (548) 240-1352 ? Cancer Care: Provides financial assistance, online support  groups, medication/co-pay assistance.  1-800-813-HOPE 773 295 0017) ? [facility] Assists [place] cancer patients and their families through emotional , educational and financial support.  212 161 5227 ? [place] DSS Where to apply for food stamps, Medicaid and utility assistance. (581)584-7385 ? RCATS: Transportation to medical appointments. 510-823-9347 ? Social Security Administration: May apply for disability if have a Stage IV cancer. 484-756-0831 786 635 4032 ? [employer], Disability and Transit Services: Assists with nutrition, care and transit needs. [facility] Support Programs: @10RELATIVEDAYS @ > Cancer Support Group  2nd Tuesday of the month 1pm-2pm, Journey Room  > Creative Journey  3rd Tuesday of the month 1130am-1pm, Journey Room  > Look Good Feel Better  1st Wednesday of the month 10am-12 noon, Journey Room (Call [facility] to register [phone])

## 2015-11-03 ENCOUNTER — Ambulatory Visit (HOSPITAL_COMMUNITY): Payer: Medicare Other

## 2015-11-04 ENCOUNTER — Encounter (HOSPITAL_BASED_OUTPATIENT_CLINIC_OR_DEPARTMENT_OTHER): Payer: Medicare Other

## 2015-11-04 ENCOUNTER — Encounter (HOSPITAL_COMMUNITY): Payer: Medicare Other | Attending: Oncology | Admitting: Oncology

## 2015-11-04 ENCOUNTER — Encounter (HOSPITAL_COMMUNITY): Payer: Self-pay | Admitting: Oncology

## 2015-11-04 DIAGNOSIS — C9110 Chronic lymphocytic leukemia of B-cell type not having achieved remission: Secondary | ICD-10-CM

## 2015-11-04 DIAGNOSIS — E790 Hyperuricemia without signs of inflammatory arthritis and tophaceous disease: Secondary | ICD-10-CM

## 2015-11-04 DIAGNOSIS — C8190 Hodgkin lymphoma, unspecified, unspecified site: Secondary | ICD-10-CM | POA: Diagnosis not present

## 2015-11-04 DIAGNOSIS — D801 Nonfamilial hypogammaglobulinemia: Secondary | ICD-10-CM

## 2015-11-04 DIAGNOSIS — Z23 Encounter for immunization: Secondary | ICD-10-CM | POA: Diagnosis not present

## 2015-11-04 LAB — CBC WITH DIFFERENTIAL/PLATELET
BASOS ABS: 0.1 10*3/uL (ref 0.0–0.1)
Basophils Relative: 2 %
Eosinophils Absolute: 0.1 10*3/uL (ref 0.0–0.7)
Eosinophils Relative: 3 %
HEMATOCRIT: 28.3 % — AB (ref 39.0–52.0)
Hemoglobin: 9.6 g/dL — ABNORMAL LOW (ref 13.0–17.0)
LYMPHS ABS: 1.8 10*3/uL (ref 0.7–4.0)
LYMPHS PCT: 42 %
MCH: 33.7 pg (ref 26.0–34.0)
MCHC: 33.9 g/dL (ref 30.0–36.0)
MCV: 99.3 fL (ref 78.0–100.0)
MONO ABS: 0.6 10*3/uL (ref 0.1–1.0)
Monocytes Relative: 15 %
NEUTROS ABS: 1.7 10*3/uL (ref 1.7–7.7)
Neutrophils Relative %: 39 %
Platelets: 64 10*3/uL — ABNORMAL LOW (ref 150–400)
RBC: 2.85 MIL/uL — ABNORMAL LOW (ref 4.22–5.81)
RDW: 18.6 % — AB (ref 11.5–15.5)
WBC: 4.3 10*3/uL (ref 4.0–10.5)

## 2015-11-04 MED ORDER — HEPARIN SOD (PORK) LOCK FLUSH 100 UNIT/ML IV SOLN
500.0000 [IU] | Freq: Once | INTRAVENOUS | Status: AC
  Administered 2015-11-04: 500 [IU] via INTRAVENOUS
  Filled 2015-11-04: qty 5

## 2015-11-04 MED ORDER — SODIUM CHLORIDE 0.9 % IV SOLN
6.0000 mg | Freq: Once | INTRAVENOUS | Status: AC
  Administered 2015-11-04: 6 mg via INTRAVENOUS
  Filled 2015-11-04: qty 4

## 2015-11-04 MED ORDER — SODIUM CHLORIDE 0.9% FLUSH
10.0000 mL | INTRAVENOUS | Status: DC | PRN
  Administered 2015-11-04: 10 mL via INTRAVENOUS
  Filled 2015-11-04: qty 10

## 2015-11-04 MED ORDER — SODIUM CHLORIDE 0.9 % IV SOLN
Freq: Once | INTRAVENOUS | Status: AC
  Administered 2015-11-04: 10:00:00 via INTRAVENOUS

## 2015-11-04 NOTE — Progress Notes (Signed)
[name] Nakayama, MD [address], [address] [place] [address]  Leukemia, lymphocytic, chronic [facility]) - Plan: sulfamethoxazole-trimethoprim (BACTRIM DS,SEPTRA DS) 800-160 MG tablet  Hypogammaglobulinemia, acquired [facility])  CURRENT THERAPY: Bendamustine/Rituxan beginning on 09/08/2015  INTERVAL HISTORY: [name] 70 y.o. 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  Now on systemic chemotherapy. AND History of Hodgkin's Lymphoma, treated in 2006. AND Hypogammaglobulenemia, started IVIG on 10/03/2015 due to recurrent URI. AND CKD, Stage III      Hodgkin's disease ([name])   10/02/2007 Initial Diagnosis    HODGKIN'S DISEASE      Leukemia, lymphocytic, chronic ([name])   07/22/2015 Procedure    IR bone marrow aspiration and biopsy     07/23/2015 Pathology Results    Bone Marrow, Aspirate,Biopsy, and Clot, left iliac crest - EXTENSIVE INVOLVEMENT BY CHRONIC LYMPHOCYTIC LEUKEMIA. PERIPHERAL BLOOD: - CHRONIC LYMPHOCYTIC LEUKEMIA. - NORMOCYTIC ANEMIA. - THROMBOCYTOPENIA.     07/23/2015 Pathology Results    Bone Marrow Flow Cytometry - CHRONIC LYMPHOCYTIC LEUKEMIA     08/20/2015 Imaging    CT CAP- Mediastinal and hilar lymphadenopathy consistent with CLL. No pulmonary lesions. Marked splenomegaly. Mild upper abdominal retroperitoneal adenopathy.     08/27/2015 Miscellaneous    Imbruvica therapy is cost-prohibitive.     09/08/2015 -  Chemotherapy    Bendamustine day 1+2 every 28 days and Rituxan Day 1 every 28 days.     10/03/2015 Survivorship    Monthly IVIG for recurrent URI in the setting of hypogammaglobulinemia.     11/04/2015 Treatment Plan Change    Treatment deferred due to thrombocytopenia      He is doing better.  His fatigue is improved today.  Stamina continues to be an issue for him which is multifactorial.  He denies any nausea or  vomiting.  He, unfortunately, has had a few deaths in the family.    Review of Systems  Constitutional: Positive for malaise/fatigue. Negative for chills and fever.  HENT: Negative.  Negative for congestion and sore throat.   Eyes: Negative.   Respiratory: Positive for cough. Negative for sputum production.   Cardiovascular: Negative for chest pain.  Gastrointestinal: Negative.  Negative for constipation, diarrhea, nausea and vomiting.  Genitourinary: Negative.   Musculoskeletal: Negative.   Skin: Negative.   Neurological: Positive for weakness.  Endo/Heme/Allergies: Negative.   Psychiatric/Behavioral: Negative.     Past Medical History:  Diagnosis Date  . Acid reflux   . Aortic stenosis   . Arthritis   . Atrial fibrillation ([name])   . BPH (benign prostatic hypertrophy)   . CLL (chronic lymphoblastic leukemia)   . Coronary atherosclerosis of native coronary artery   . Essential hypertension, benign   . Hodgkin's disease   . HOH (hard of hearing)   . Hyperlipidemia   . Hypogammaglobulinemia ([name])   . Indirect inguinal hernia 04/13/2013  . Kidney stones   . Pre-diabetes 08/04/2011  . Wears dentures   . Wears glasses     Past Surgical History:  Procedure Laterality Date  . AORTIC VALVE REPLACEMENT  10/09   18mm Magna Pericardial   . BONE MARROW ASPIRATION    . CATARACT EXTRACTION W/PHACO Right 01/07/2014   Procedure: CATARACT EXTRACTION PHACO AND INTRAOCULAR LENS PLACEMENT ([place]);  Surgeon: Tonny Branch, MD;  Location: [facility];  Service: Ophthalmology;  Laterality: Right;  CDE:15.31  . CATARACT EXTRACTION W/PHACO Left 03/25/2014  Procedure: CATARACT EXTRACTION PHACO AND INTRAOCULAR LENS PLACEMENT ([facility]);  Surgeon: Tonny Branch, MD;  Location: [facility];  Service: Ophthalmology;  Laterality: Left;  CDE 6.55  . COLON SURGERY    . CORONARY ARTERY BYPASS GRAFT  10/09   SVG to RCA  . DENTAL SURGERY  08/2012  . ESOPHAGOGASTRODUODENOSCOPY     scrapping of throat and stretching  . EYE  SURGERY Right 02/07/2014   cataract  . LITHOTRIPSY    . MASS EXCISION N/A 12/05/2012   Procedure: EXCISION MIDLINE NECK MASS;  Surgeon: [name] Dike, MD;  Location: [place];  Service: ENT;  Laterality: N/A;  . MULTIPLE TOOTH EXTRACTIONS    . PORTACATH PLACEMENT    . TONGUE BIOPSY  12/2012    Family History  Problem Relation Age of Onset  . Heart failure Mother   . Blindness Mother   . Cancer Father 51    Lung   . Cancer Brother 77    acute leukemia     Social History   Social History  . Marital status: Widowed    Spouse name: N/A  . Number of children: N/A  . Years of education: N/A   Social History Main Topics  . Smoking status: Never Smoker  . Smokeless tobacco: Never Used     Comment: pt denies tobacco use   . Alcohol use No     Comment: pt denies alcohol   . Drug use: No  . Sexual activity: Not Currently   Other Topics Concern  . None   Social History Narrative  . None     PHYSICAL EXAMINATION  ECOG PERFORMANCE STATUS: 1 - Symptomatic but completely ambulatory  There were no vitals filed for this visit.  BP 105/52 P 76 R 16 T 97.8 O2 sat 100% on RA  GENERAL:alert, no distress, comfortable, cooperative, obese, smiling and unaccompanied, malodorous SKIN: skin color, texture, turgor are normal, no rashes or significant lesions HEAD: Normocephalic, No masses, lesions, tenderness or abnormalities EYES: normal, EOMI, Conjunctiva are pink and non-injected EARS: External ears normal OROPHARYNX:lips, buccal mucosa, and tongue normal and mucous membranes are moist  NECK: supple, trachea midline LYMPH:  no palpable lymphadenopathy BREAST:not examined LUNGS: clear to auscultation  HEART: regular rate & rhythm ABDOMEN:abdomen soft, non-tender, obese and normal bowel sounds BACK: Back symmetric, no curvature. EXTREMITIES:less then 2 second capillary refill, no joint deformities, effusion, or inflammation, no skin discoloration, no cyanosis   NEURO: alert & oriented x 3 with fluent speech, no focal motor/sensory deficits, gait normal   LABORATORY DATA: CBC    Component Value Date/Time   WBC 4.3 11/04/2015 0845   RBC 2.85 (L) 11/04/2015 0845   HGB 9.6 (L) 11/04/2015 0845   HCT 28.3 (L) 11/04/2015 0845   PLT 64 (L) 11/04/2015 0845   MCV 99.3 11/04/2015 0845   MCH 33.7 11/04/2015 0845   MCHC 33.9 11/04/2015 0845   RDW 18.6 (H) 11/04/2015 0845   LYMPHSABS 1.8 11/04/2015 0845   MONOABS 0.6 11/04/2015 0845   EOSABS 0.1 11/04/2015 0845   BASOSABS 0.1 11/04/2015 0845      Chemistry      Component Value Date/Time   NA 139 10/31/2015 0950   K 3.8 10/31/2015 0950   CL 110 10/31/2015 0950   CO2 24 10/31/2015 0950   BUN 11 10/31/2015 0950   CREATININE 1.24 10/31/2015 0950   CREATININE 1.59 (H) 06/10/2015 1601      Component Value Date/Time   CALCIUM 8.0 (L) 10/31/2015  0950   ALKPHOS 96 10/31/2015 0950   AST 24 10/31/2015 0950   ALT 13 (L) 10/31/2015 0950   BILITOT 0.7 10/31/2015 0950     Lab Results  Component Value Date   LABURIC 8.4 (H) 10/31/2015   Lab Results  Component Value Date   CALCIUM 8.0 (L) 10/31/2015   PHOS 3.3 10/31/2015     PENDING LABS:   RADIOGRAPHIC STUDIES:  No results found.   PATHOLOGY:    ASSESSMENT AND PLAN:  Leukemia, lymphocytic, chronic (HCC) CLL, requiring treatment due to extensive involvement of CLL in bone marrow aspiration and biopsy in addition to climbing WBC and thrombocytopenia.  He began Bendamustine/Rituxan on 09/08/2015.  Oncology history is updated.  Labs today: CBC diff, CMET, LDH, magnesium, phosphorus, uric acid.  I personally reviewed and went over laboratory results with the patient.  The results are noted within this dictation.  Platelet count today is 64,000.  As a result, we will defer treatment x 7 days.  Hyperuricemia is noted and therefore, he will get Rasburicase today.  Weekly labs: CBC diff, CMET, phosphorus, and uric acid.  Will provide  additional Rasburicase if indicated.  He is given a form for a handicap placard as well today.  Return in 4 weeks for follow-up.  Hypogammaglobulinemia, acquired ([place]) Hypogammaglobulinemia, secondary to CLL and past treatment of Hodgkin's Lymphoma.    Oncology Flowsheet 10/31/2015  Immune Globulin 10% (PRIVIGEN) IV 0.4 g/kg        ORDERS PLACED FOR THIS ENCOUNTER: No orders of the defined types were placed in this encounter.   MEDICATIONS PRESCRIBED THIS ENCOUNTER: Meds ordered this encounter  Medications  . sulfamethoxazole-trimethoprim (BACTRIM DS,SEPTRA DS) 800-160 MG tablet    Sig: Take 1 tablet by mouth 3 (three) times a week.    Dispense:  12 tablet    Refill:  2    THERAPY PLAN:  Continue treatment as planned.  All questions were answered. The patient knows to call the clinic with any problems, questions or concerns. We can certainly see the patient much sooner if necessary.  Patient and plan discussed with Dr. Ancil Linsey and she is in agreement with the aforementioned.   This note is electronically signed by: Doy Mince 11/04/2015 5:08 PM

## 2015-11-04 NOTE — Patient Instructions (Signed)
[place] at [facility] Discharge Instructions  RECOMMENDATIONS MADE BY THE CONSULTANT AND ANY TEST RESULTS WILL BE SENT TO YOUR REFERRING PHYSICIAN.  You saw [name] Crigler, PA-C, today. Holding chemotherapy this week. IVIG in 4 weeks. Labs next week. Return for follow up and treatment in 5 weeks.  Thank you for choosing [place] at [facility] to provide your oncology and hematology care.  To afford each patient quality time with our provider, please arrive at least 15 minutes before your scheduled appointment time.   Beginning January 23rd 2017 lab work for the [facility] will be done in the  Main lab at [facility] on 1st floor. If you have a lab appointment with the [facility] please come in thru the  Main Entrance and check in at the main information desk  You need to re-schedule your appointment should you arrive 10 or more minutes late.  We strive to give you quality time with our providers, and arriving late affects you and other patients whose appointments are after yours.  Also, if you no show three or more times for appointments you may be dismissed from the clinic at the providers discretion.     Again, thank you for choosing [facility].  Our hope is that these requests will decrease the amount of time that you wait before being seen by our physicians.       _____________________________________________________________  Should you have questions after your visit to [facility], please contact our office at [phone] between the hours of 8:30 a.m. and 4:30 p.m.  Voicemails left after 4:30 p.m. will not be returned until the following business day.  For prescription refill requests, have your pharmacy contact our office.         Resources For Cancer Patients and their Caregivers ? American Cancer Society: Can assist with transportation, wigs, general needs, runs Look Good Feel Better.         857-198-2467 ? Cancer Care: Provides financial assistance, online support groups, medication/co-pay assistance.  1-800-813-HOPE 432-323-9837) ? [place] Assists [place] cancer patients and their families through emotional , educational and financial support.  ([phone] ? [place] DSS Where to apply for food stamps, Medicaid and utility assistance. (534) 362-6594 ? RCATS: Transportation to medical appointments. 8583918930 ? Social Security Administration: May apply for disability if have a Stage IV cancer. [phone] ? [employer], Disability and Transit Services: Assists with nutrition, care and transit needs. [place] Support Programs: @10RELATIVEDAYS @ > Cancer Support Group  2nd Tuesday of the month 1pm-2pm, Journey Room  > Creative Journey  3rd Tuesday of the month 1130am-1pm, Journey Room  > Look Good Feel Better  1st Wednesday of the month 10am-12 noon, Journey Room (Call [place] to register [phone])

## 2015-11-04 NOTE — Patient Instructions (Signed)
[place] at [facility] Discharge Instructions  RECOMMENDATIONS MADE BY THE CONSULTANT AND ANY TEST RESULTS WILL BE SENT TO YOUR REFERRING PHYSICIAN.  Received Elitik today. Follow-up as scheduled. Call clinic for any questions or concerns  Thank you for choosing [place] at [facility] to provide your oncology and hematology care.  To afford each patient quality time with our provider, please arrive at least 15 minutes before your scheduled appointment time.   Beginning January 23rd 2017 lab work for the [facility] will be done in the  Main lab at [facility] on 1st floor. If you have a lab appointment with the [facility] please come in thru the  Main Entrance and check in at the main information desk  You need to re-schedule your appointment should you arrive 10 or more minutes late.  We strive to give you quality time with our providers, and arriving late affects you and other patients whose appointments are after yours.  Also, if you no show three or more times for appointments you may be dismissed from the clinic at the providers discretion.     Again, thank you for choosing [facility].  Our hope is that these requests will decrease the amount of time that you wait before being seen by our physicians.       _____________________________________________________________  Should you have questions after your visit to [facility], please contact our office at [phone] between the hours of 8:30 a.m. and 4:30 p.m.  Voicemails left after 4:30 p.m. will not be returned until the following business day.  For prescription refill requests, have your pharmacy contact our office.         Resources For Cancer Patients and their Caregivers ? American Cancer Society: Can assist with transportation, wigs, general needs, runs Look Good Feel Better.        401-837-2409 ? Cancer Care: Provides financial  assistance, online support groups, medication/co-pay assistance.  1-800-813-HOPE 848-683-2905) ? [place] Assists [place] cancer patients and their families through emotional , educational and financial support.  ([phone] [phone] ? [place] DSS Where to apply for food stamps, Medicaid and utility assistance. 680-451-2446 ? RCATS: Transportation to medical appointments. 954-445-8480 ? Social Security Administration: May apply for disability if have a Stage IV cancer. 954-242-3382 640-175-8782 ? [employer], Disability and Transit Services: Assists with nutrition, care and transit needs. [place] Support Programs: @10RELATIVEDAYS @ > Cancer Support Group  2nd Tuesday of the month 1pm-2pm, Journey Room  > Creative Journey  3rd Tuesday of the month 1130am-1pm, Journey Room  > Look Good Feel Better  1st Wednesday of the month 10am-12 noon, Journey Room (Call [place] to register [phone])

## 2015-11-04 NOTE — Progress Notes (Signed)
[name] tolerated Elitik well without complaints. Chemo tx held today due to platelets of 64,000 per Kirby Crigler PA. Pt instructed to call for any active bleeding and verbalized understanding.Pt discharged self ambulatory in satisfactory condition

## 2015-11-04 NOTE — Assessment & Plan Note (Addendum)
CLL, requiring treatment due to extensive involvement of CLL in bone marrow aspiration and biopsy in addition to climbing WBC and thrombocytopenia.  He began Bendamustine/Rituxan on 09/08/2015.  Oncology history is updated.  Labs today: CBC diff, CMET, LDH, magnesium, phosphorus, uric acid.  I personally reviewed and went over laboratory results with the patient.  The results are noted within this dictation.  Platelet count today is 64,000.  As a result, we will defer treatment x 7 days.  Hyperuricemia is noted and therefore, he will get Rasburicase today.  Weekly labs: CBC diff, CMET, phosphorus, and uric acid.  Will provide additional Rasburicase if indicated.  He is given a form for a handicap placard as well today.  Return in 4 weeks for follow-up.

## 2015-11-04 NOTE — Assessment & Plan Note (Signed)
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    Oncology Flowsheet 10/31/2015  Immune Globulin 10% (PRIVIGEN) IV 0.4 g/kg

## 2015-11-05 ENCOUNTER — Inpatient Hospital Stay (HOSPITAL_COMMUNITY): Payer: Medicare Other

## 2015-11-11 ENCOUNTER — Encounter (HOSPITAL_BASED_OUTPATIENT_CLINIC_OR_DEPARTMENT_OTHER): Payer: Medicare Other

## 2015-11-11 DIAGNOSIS — Z23 Encounter for immunization: Secondary | ICD-10-CM | POA: Diagnosis not present

## 2015-11-11 DIAGNOSIS — C8190 Hodgkin lymphoma, unspecified, unspecified site: Secondary | ICD-10-CM | POA: Diagnosis not present

## 2015-11-11 DIAGNOSIS — C9110 Chronic lymphocytic leukemia of B-cell type not having achieved remission: Secondary | ICD-10-CM | POA: Diagnosis not present

## 2015-11-11 LAB — CBC WITH DIFFERENTIAL/PLATELET
BASOS PCT: 2 %
Basophils Absolute: 0.1 10*3/uL (ref 0.0–0.1)
EOS ABS: 0.3 10*3/uL (ref 0.0–0.7)
EOS PCT: 7 %
HCT: 28.4 % — ABNORMAL LOW (ref 39.0–52.0)
Hemoglobin: 9.4 g/dL — ABNORMAL LOW (ref 13.0–17.0)
LYMPHS PCT: 39 %
Lymphs Abs: 1.7 10*3/uL (ref 0.7–4.0)
MCH: 33.5 pg (ref 26.0–34.0)
MCHC: 33.1 g/dL (ref 30.0–36.0)
MCV: 101.1 fL — AB (ref 78.0–100.0)
MONO ABS: 0.6 10*3/uL (ref 0.1–1.0)
Monocytes Relative: 13 %
Neutro Abs: 1.8 10*3/uL (ref 1.7–7.7)
Neutrophils Relative %: 40 %
PLATELETS: 62 10*3/uL — AB (ref 150–400)
RBC: 2.81 MIL/uL — ABNORMAL LOW (ref 4.22–5.81)
RDW: 18.8 % — AB (ref 11.5–15.5)
WBC: 4.4 10*3/uL (ref 4.0–10.5)

## 2015-11-11 LAB — COMPREHENSIVE METABOLIC PANEL
ALT: 14 U/L — ABNORMAL LOW (ref 17–63)
AST: 22 U/L (ref 15–41)
Albumin: 3 g/dL — ABNORMAL LOW (ref 3.5–5.0)
Alkaline Phosphatase: 87 U/L (ref 38–126)
Anion gap: 3 — ABNORMAL LOW (ref 5–15)
BUN: 10 mg/dL (ref 6–20)
CHLORIDE: 110 mmol/L (ref 101–111)
CO2: 26 mmol/L (ref 22–32)
Calcium: 8.1 mg/dL — ABNORMAL LOW (ref 8.9–10.3)
Creatinine, Ser: 1.29 mg/dL — ABNORMAL HIGH (ref 0.61–1.24)
GFR calc Af Amer: >60 mL/min (ref >60)
GFR, EST NON AFRICAN AMERICAN: 54 mL/min — AB (ref >60)
Glucose, Bld: 113 mg/dL — ABNORMAL HIGH (ref 65–99)
POTASSIUM: 3.6 mmol/L (ref 3.5–5.1)
Sodium: 139 mmol/L (ref 135–145)
Total Bilirubin: 0.6 mg/dL (ref 0.3–1.2)
Total Protein: 5.5 g/dL — ABNORMAL LOW (ref 6.5–8.1)

## 2015-11-11 LAB — SEDIMENTATION RATE: SED RATE: 25 mm/h — AB (ref 0–16)

## 2015-11-11 LAB — PHOSPHORUS: PHOSPHORUS: 3.6 mg/dL (ref 2.5–4.6)

## 2015-11-11 LAB — LACTATE DEHYDROGENASE: LDH: 184 U/L (ref 98–192)

## 2015-11-11 LAB — MAGNESIUM: Magnesium: 1.8 mg/dL (ref 1.7–2.4)

## 2015-11-11 LAB — URIC ACID: Uric Acid, Serum: 6.1 mg/dL (ref 4.4–7.6)

## 2015-11-11 MED ORDER — HEPARIN SOD (PORK) LOCK FLUSH 100 UNIT/ML IV SOLN
500.0000 [IU] | Freq: Once | INTRAVENOUS | Status: AC
  Administered 2015-11-11: 500 [IU] via INTRAVENOUS

## 2015-11-11 MED ORDER — SODIUM CHLORIDE 0.9% FLUSH
10.0000 mL | INTRAVENOUS | Status: DC | PRN
  Administered 2015-11-11: 10 mL via INTRAVENOUS
  Filled 2015-11-11: qty 10

## 2015-11-11 MED ORDER — HEPARIN SOD (PORK) LOCK FLUSH 100 UNIT/ML IV SOLN
INTRAVENOUS | Status: AC
  Filled 2015-11-11: qty 5

## 2015-11-11 NOTE — Patient Instructions (Signed)
[place] at [facility] Discharge Instructions  RECOMMENDATIONS MADE BY THE CONSULTANT AND ANY TEST RESULTS WILL BE SENT TO YOUR REFERRING PHYSICIAN.  Port lab draw with flush today. Chemo tx held today due to low platelets. Follow-up as scheduled. Call clinic for any questions or concerns  Thank you for choosing [place] at [facility] to provide your oncology and hematology care.  To afford each patient quality time with our provider, please arrive at least 15 minutes before your scheduled appointment time.   Beginning January 23rd 2017 lab work for the [facility] will be done in the  Main lab at [facility] on 1st floor. If you have a lab appointment with the [facility] please come in thru the  Main Entrance and check in at the main information desk  You need to re-schedule your appointment should you arrive 10 or more minutes late.  We strive to give you quality time with our providers, and arriving late affects you and other patients whose appointments are after yours.  Also, if you no show three or more times for appointments you may be dismissed from the clinic at the providers discretion.     Again, thank you for choosing [facility].  Our hope is that these requests will decrease the amount of time that you wait before being seen by our physicians.       _____________________________________________________________  Should you have questions after your visit to [facility], please contact our office at [phone] between the hours of 8:30 a.m. and 4:30 p.m.  Voicemails left after 4:30 p.m. will not be returned until the following business day.  For prescription refill requests, have your pharmacy contact our office.         Resources For Cancer Patients and their Caregivers ? American Cancer Society: Can assist with transportation, wigs, general needs, runs Look Good Feel Better.         808-318-3462 ? Cancer Care: Provides financial assistance, online support groups, medication/co-pay assistance.  1-800-813-HOPE 484-028-6595) ? [place] Assists [place] cancer patients and their families through emotional , educational and financial support.  251-017-3944 ? [place] DSS Where to apply for food stamps, Medicaid and utility assistance. 612 704 4075 ? RCATS: Transportation to medical appointments. 305-407-1971 ? Social Security Administration: May apply for disability if have a Stage IV cancer. 617-392-2951 [phone] ? [employer], Disability and Transit Services: Assists with nutrition, care and transit needs. [place] Support Programs: @10RELATIVEDAYS @ > Cancer Support Group  2nd Tuesday of the month 1pm-2pm, Journey Room  > Creative Journey  3rd Tuesday of the month 1130am-1pm, Journey Room  > Look Good Feel Better  1st Wednesday of the month 10am-12 noon, Journey Room (Call [place] to register [phone])

## 2015-11-11 NOTE — Progress Notes (Signed)
Blood drawn from port for labs. Platelets 62. Labs shown to [employer] PA. Chemo tx deferred until next week with repeat labs. Pt verbalized understanding. Reinforced pt to call for any s/sx of active bleeding. Pt discharged self ambulatory after port deaccessed in stable condition

## 2015-11-12 ENCOUNTER — Inpatient Hospital Stay (HOSPITAL_COMMUNITY): Payer: Medicare Other

## 2015-11-18 ENCOUNTER — Encounter (HOSPITAL_BASED_OUTPATIENT_CLINIC_OR_DEPARTMENT_OTHER): Payer: Medicare Other

## 2015-11-18 ENCOUNTER — Other Ambulatory Visit (HOSPITAL_COMMUNITY): Payer: Self-pay | Admitting: Oncology

## 2015-11-18 ENCOUNTER — Encounter (HOSPITAL_COMMUNITY): Payer: Self-pay

## 2015-11-18 VITALS — BP 116/56 | HR 70 | Temp 98.0°F | Resp 16 | Wt 215.0 lb

## 2015-11-18 DIAGNOSIS — C8190 Hodgkin lymphoma, unspecified, unspecified site: Secondary | ICD-10-CM | POA: Diagnosis not present

## 2015-11-18 DIAGNOSIS — Z23 Encounter for immunization: Secondary | ICD-10-CM | POA: Diagnosis not present

## 2015-11-18 DIAGNOSIS — C9110 Chronic lymphocytic leukemia of B-cell type not having achieved remission: Secondary | ICD-10-CM | POA: Diagnosis not present

## 2015-11-18 LAB — COMPREHENSIVE METABOLIC PANEL
ALK PHOS: 93 U/L (ref 38–126)
ALT: 16 U/L — AB (ref 17–63)
AST: 23 U/L (ref 15–41)
Albumin: 3 g/dL — ABNORMAL LOW (ref 3.5–5.0)
Anion gap: 4 — ABNORMAL LOW (ref 5–15)
BILIRUBIN TOTAL: 0.5 mg/dL (ref 0.3–1.2)
BUN: 12 mg/dL (ref 6–20)
CO2: 26 mmol/L (ref 22–32)
CREATININE: 1.29 mg/dL — AB (ref 0.61–1.24)
Calcium: 7.9 mg/dL — ABNORMAL LOW (ref 8.9–10.3)
Chloride: 107 mmol/L (ref 101–111)
GFR calc Af Amer: >60 mL/min (ref >60)
GFR, EST NON AFRICAN AMERICAN: 54 mL/min — AB (ref >60)
Glucose, Bld: 105 mg/dL — ABNORMAL HIGH (ref 65–99)
Potassium: 3.7 mmol/L (ref 3.5–5.1)
Sodium: 137 mmol/L (ref 135–145)
TOTAL PROTEIN: 5.5 g/dL — AB (ref 6.5–8.1)

## 2015-11-18 LAB — CBC WITH DIFFERENTIAL/PLATELET
BASOS ABS: 0 10*3/uL (ref 0.0–0.1)
Basophils Relative: 1 %
Eosinophils Absolute: 0.6 10*3/uL (ref 0.0–0.7)
Eosinophils Relative: 11 %
HEMATOCRIT: 29.8 % — AB (ref 39.0–52.0)
Hemoglobin: 10.1 g/dL — ABNORMAL LOW (ref 13.0–17.0)
LYMPHS PCT: 31 %
Lymphs Abs: 1.8 10*3/uL (ref 0.7–4.0)
MCH: 34.4 pg — ABNORMAL HIGH (ref 26.0–34.0)
MCHC: 33.9 g/dL (ref 30.0–36.0)
MCV: 101.4 fL — AB (ref 78.0–100.0)
MONO ABS: 0.8 10*3/uL (ref 0.1–1.0)
Monocytes Relative: 13 %
NEUTROS ABS: 2.6 10*3/uL (ref 1.7–7.7)
Neutrophils Relative %: 44 %
Platelets: 72 10*3/uL — ABNORMAL LOW (ref 150–400)
RBC: 2.94 MIL/uL — AB (ref 4.22–5.81)
RDW: 18.1 % — ABNORMAL HIGH (ref 11.5–15.5)
WBC: 5.8 10*3/uL (ref 4.0–10.5)

## 2015-11-18 LAB — PHOSPHORUS: PHOSPHORUS: 3.6 mg/dL (ref 2.5–4.6)

## 2015-11-18 LAB — MAGNESIUM: MAGNESIUM: 1.8 mg/dL (ref 1.7–2.4)

## 2015-11-18 LAB — LACTATE DEHYDROGENASE: LDH: 186 U/L (ref 98–192)

## 2015-11-18 LAB — URIC ACID: Uric Acid, Serum: 7.6 mg/dL (ref 4.4–7.6)

## 2015-11-18 LAB — SEDIMENTATION RATE: Sed Rate: 22 mm/hr — ABNORMAL HIGH (ref 0–16)

## 2015-11-18 MED ORDER — HEPARIN SOD (PORK) LOCK FLUSH 100 UNIT/ML IV SOLN
500.0000 [IU] | Freq: Once | INTRAVENOUS | Status: AC | PRN
  Administered 2015-11-18: 500 [IU]

## 2015-11-18 MED ORDER — SODIUM CHLORIDE 0.9% FLUSH
10.0000 mL | INTRAVENOUS | Status: DC | PRN
  Administered 2015-11-18: 10 mL
  Filled 2015-11-18: qty 10

## 2015-11-18 MED ORDER — HEPARIN SOD (PORK) LOCK FLUSH 100 UNIT/ML IV SOLN
INTRAVENOUS | Status: AC
  Filled 2015-11-18: qty 5

## 2015-11-18 NOTE — Patient Instructions (Addendum)
[place] at [facility] Discharge Instructions  RECOMMENDATIONS MADE BY THE CONSULTANT AND ANY TEST RESULTS WILL BE SENT TO YOUR REFERRING PHYSICIAN.  Chemo held today due to platelets.  Follow up as scheduled.  Thank you for choosing [place] at [facility] to provide your oncology and hematology care.  To afford each patient quality time with our provider, please arrive at least 15 minutes before your scheduled appointment time.   Beginning January 23rd 2017 lab work for the [facility] will be done in the  Main lab at [facility] on 1st floor. If you have a lab appointment with the [facility] please come in thru the  Main Entrance and check in at the main information desk  You need to re-schedule your appointment should you arrive 10 or more minutes late.  We strive to give you quality time with our providers, and arriving late affects you and other patients whose appointments are after yours.  Also, if you no show three or more times for appointments you may be dismissed from the clinic at the providers discretion.     Again, thank you for choosing [facility].  Our hope is that these requests will decrease the amount of time that you wait before being seen by our physicians.       _____________________________________________________________  Should you have questions after your visit to [facility], please contact our office at [phone] between the hours of 8:30 a.m. and 4:30 p.m.  Voicemails left after 4:30 p.m. will not be returned until the following business day.  For prescription refill requests, have your pharmacy contact our office.         Resources For Cancer Patients and their Caregivers ? American Cancer Society: Can assist with transportation, wigs, general needs, runs Look Good Feel Better.        4077689395 ? Cancer Care: Provides financial assistance, online support groups,  medication/co-pay assistance.  1-800-813-HOPE 763-729-8663) ? [place] Assists [place] cancer patients and their families through emotional , educational and financial support.  918-286-0442 ? [place] DSS Where to apply for food stamps, Medicaid and utility assistance. 339-804-6991 ? RCATS: Transportation to medical appointments. (618)193-0889 ? Social Security Administration: May apply for disability if have a Stage IV cancer. (438) 144-2495 [phone])862-1554 ? [employer], Disability and Transit Services: Assists with nutrition, care and transit needs. [place] Support Programs: @10RELATIVEDAYS @ > Cancer Support Group  2nd Tuesday of the month 1pm-2pm, Journey Room  > Creative Journey  3rd Tuesday of the month 1130am-1pm, Journey Room  > Look Good Feel Better  1st Wednesday of the month 10am-12 noon, Journey Room (Call [place] to register [phone])

## 2015-11-19 ENCOUNTER — Inpatient Hospital Stay (HOSPITAL_COMMUNITY): Payer: Medicare Other

## 2015-11-25 ENCOUNTER — Other Ambulatory Visit (HOSPITAL_COMMUNITY): Payer: Self-pay | Admitting: Oncology

## 2015-11-25 ENCOUNTER — Encounter (HOSPITAL_BASED_OUTPATIENT_CLINIC_OR_DEPARTMENT_OTHER): Payer: Medicare Other

## 2015-11-25 VITALS — BP 132/60 | HR 83 | Temp 97.8°F | Resp 18 | Wt 216.0 lb

## 2015-11-25 DIAGNOSIS — C9110 Chronic lymphocytic leukemia of B-cell type not having achieved remission: Secondary | ICD-10-CM | POA: Diagnosis not present

## 2015-11-25 DIAGNOSIS — Z23 Encounter for immunization: Secondary | ICD-10-CM | POA: Diagnosis not present

## 2015-11-25 DIAGNOSIS — C8190 Hodgkin lymphoma, unspecified, unspecified site: Secondary | ICD-10-CM | POA: Diagnosis not present

## 2015-11-25 DIAGNOSIS — E790 Hyperuricemia without signs of inflammatory arthritis and tophaceous disease: Secondary | ICD-10-CM | POA: Diagnosis not present

## 2015-11-25 DIAGNOSIS — E876 Hypokalemia: Secondary | ICD-10-CM

## 2015-11-25 LAB — CBC WITH DIFFERENTIAL/PLATELET
BASOS ABS: 0.1 10*3/uL (ref 0.0–0.1)
BASOS PCT: 1 %
EOS ABS: 0.8 10*3/uL — AB (ref 0.0–0.7)
EOS PCT: 14 %
HCT: 30.5 % — ABNORMAL LOW (ref 39.0–52.0)
Hemoglobin: 10.2 g/dL — ABNORMAL LOW (ref 13.0–17.0)
Lymphocytes Relative: 30 %
Lymphs Abs: 1.8 10*3/uL (ref 0.7–4.0)
MCH: 33.9 pg (ref 26.0–34.0)
MCHC: 33.4 g/dL (ref 30.0–36.0)
MCV: 101.3 fL — ABNORMAL HIGH (ref 78.0–100.0)
MONO ABS: 0.8 10*3/uL (ref 0.1–1.0)
Monocytes Relative: 13 %
Neutro Abs: 2.5 10*3/uL (ref 1.7–7.7)
Neutrophils Relative %: 43 %
PLATELETS: 94 10*3/uL — AB (ref 150–400)
RBC: 3.01 MIL/uL — ABNORMAL LOW (ref 4.22–5.81)
RDW: 17.2 % — AB (ref 11.5–15.5)
WBC: 5.9 10*3/uL (ref 4.0–10.5)

## 2015-11-25 LAB — PHOSPHORUS: PHOSPHORUS: 3.4 mg/dL (ref 2.5–4.6)

## 2015-11-25 LAB — COMPREHENSIVE METABOLIC PANEL
ALBUMIN: 3 g/dL — AB (ref 3.5–5.0)
ALT: 12 U/L — ABNORMAL LOW (ref 17–63)
ANION GAP: 8 (ref 5–15)
AST: 24 U/L (ref 15–41)
Alkaline Phosphatase: 91 U/L (ref 38–126)
BUN: 10 mg/dL (ref 6–20)
CHLORIDE: 107 mmol/L (ref 101–111)
CO2: 25 mmol/L (ref 22–32)
Calcium: 8.2 mg/dL — ABNORMAL LOW (ref 8.9–10.3)
Creatinine, Ser: 1.21 mg/dL (ref 0.61–1.24)
GFR calc Af Amer: >60 mL/min (ref >60)
GFR calc non Af Amer: 58 mL/min — ABNORMAL LOW (ref >60)
GLUCOSE: 105 mg/dL — AB (ref 65–99)
POTASSIUM: 3.3 mmol/L — AB (ref 3.5–5.1)
Sodium: 140 mmol/L (ref 135–145)
Total Bilirubin: 0.4 mg/dL (ref 0.3–1.2)
Total Protein: 5.5 g/dL — ABNORMAL LOW (ref 6.5–8.1)

## 2015-11-25 LAB — URIC ACID: URIC ACID, SERUM: 7.7 mg/dL — AB (ref 4.4–7.6)

## 2015-11-25 LAB — SEDIMENTATION RATE: SED RATE: 23 mm/h — AB (ref 0–16)

## 2015-11-25 LAB — LACTATE DEHYDROGENASE: LDH: 192 U/L (ref 98–192)

## 2015-11-25 LAB — MAGNESIUM: MAGNESIUM: 1.8 mg/dL (ref 1.7–2.4)

## 2015-11-25 MED ORDER — HEPARIN SOD (PORK) LOCK FLUSH 100 UNIT/ML IV SOLN
500.0000 [IU] | Freq: Once | INTRAVENOUS | Status: AC
  Administered 2015-11-25: 500 [IU] via INTRAVENOUS
  Filled 2015-11-25: qty 5

## 2015-11-25 MED ORDER — SODIUM CHLORIDE 0.9 % IV SOLN
6.0000 mg | Freq: Once | INTRAVENOUS | Status: AC
  Administered 2015-11-25: 6 mg via INTRAVENOUS
  Filled 2015-11-25: qty 1

## 2015-11-25 MED ORDER — SODIUM CHLORIDE 0.9 % IV SOLN
Freq: Once | INTRAVENOUS | Status: AC
  Administered 2015-11-25: 11:00:00 via INTRAVENOUS

## 2015-11-25 MED ORDER — SODIUM CHLORIDE 0.9% FLUSH
10.0000 mL | INTRAVENOUS | Status: DC | PRN
  Administered 2015-11-25: 10 mL via INTRAVENOUS
  Filled 2015-11-25: qty 10

## 2015-11-25 MED ORDER — POTASSIUM CHLORIDE CRYS ER 20 MEQ PO TBCR
40.0000 meq | EXTENDED_RELEASE_TABLET | Freq: Once | ORAL | Status: AC
  Administered 2015-11-25: 40 meq via ORAL
  Filled 2015-11-25: qty 2

## 2015-11-25 NOTE — Progress Notes (Signed)
[name] tolerated Rasburicase well without issues.Lab results shown to Dr. Whitney Muse. Chemo tx held today due to low platelets which were 94 per MD.Pt discharged self ambulatory in satisfactory condition

## 2015-11-25 NOTE — Patient Instructions (Signed)
[place] at [facility] Discharge Instructions  RECOMMENDATIONS MADE BY THE CONSULTANT AND ANY TEST RESULTS WILL BE SENT TO YOUR REFERRING PHYSICIAN.  Chemo tx held today due to low platelets.Rasburicase given IV and Potassium given PO today. Follow-up as scheduled. Call clinic for any questions or concerns  Thank you for choosing [place] at [facility] to provide your oncology and hematology care.  To afford each patient quality time with our provider, please arrive at least 15 minutes before your scheduled appointment time.   Beginning January 23rd 2017 lab work for the [facility] will be done in the  Main lab at [facility] on 1st floor. If you have a lab appointment with the [facility] please come in thru the  Main Entrance and check in at the main information desk  You need to re-schedule your appointment should you arrive 10 or more minutes late.  We strive to give you quality time with our providers, and arriving late affects you and other patients whose appointments are after yours.  Also, if you no show three or more times for appointments you may be dismissed from the clinic at the providers discretion.     Again, thank you for choosing [facility].  Our hope is that these requests will decrease the amount of time that you wait before being seen by our physicians.       _____________________________________________________________  Should you have questions after your visit to [facility], please contact our office at [phone] between the hours of 8:30 a.m. and 4:30 p.m.  Voicemails left after 4:30 p.m. will not be returned until the following business day.  For prescription refill requests, have your pharmacy contact our office.         Resources For Cancer Patients and their Caregivers ? American Cancer Society: Can assist with transportation, wigs, general needs, runs Look Good Feel Better.         ([phone] ? Cancer Care: Provides financial assistance, online support groups, medication/co-pay assistance.  1-800-813-HOPE 515-582-2003) ? [place] Assists [place] cancer patients and their families through emotional , educational and financial support.  307-597-5810 ? [place] DSS Where to apply for food stamps, Medicaid and utility assistance. 801 067 4626 ? RCATS: Transportation to medical appointments. ([phone] ? Social Security Administration: May apply for disability if have a Stage IV cancer. 260-141-9580 [phone] ? [employer], Disability and Transit Services: Assists with nutrition, care and transit needs. [place] Support Programs: @10RELATIVEDAYS @ > Cancer Support Group  2nd Tuesday of the month 1pm-2pm, Journey Room  > Creative Journey  3rd Tuesday of the month 1130am-1pm, Journey Room  > Look Good Feel Better  1st Wednesday of the month 10am-12 noon, Journey Room (Call [place] to register [phone])

## 2015-11-26 ENCOUNTER — Inpatient Hospital Stay (HOSPITAL_COMMUNITY): Payer: Medicare Other

## 2015-11-28 ENCOUNTER — Ambulatory Visit (HOSPITAL_COMMUNITY): Payer: Medicare Other

## 2015-11-28 NOTE — Progress Notes (Deleted)
Patient did not show for appointment today. Patient called twice on home phone and cell phone and messages left on answering machine each time. Patient did not return call to clinic and did not show up.

## 2015-12-02 ENCOUNTER — Ambulatory Visit (HOSPITAL_COMMUNITY): Payer: Medicare Other | Admitting: Hematology & Oncology

## 2015-12-02 ENCOUNTER — Ambulatory Visit (HOSPITAL_COMMUNITY): Payer: Medicare Other

## 2015-12-03 ENCOUNTER — Encounter (HOSPITAL_COMMUNITY): Payer: Medicare Other | Attending: Oncology

## 2015-12-03 ENCOUNTER — Other Ambulatory Visit (HOSPITAL_COMMUNITY): Payer: Self-pay | Admitting: Oncology

## 2015-12-03 ENCOUNTER — Encounter (HOSPITAL_COMMUNITY): Payer: Self-pay

## 2015-12-03 ENCOUNTER — Inpatient Hospital Stay (HOSPITAL_COMMUNITY): Payer: Medicare Other

## 2015-12-03 VITALS — BP 134/57 | HR 69 | Temp 97.9°F | Resp 18 | Wt 219.6 lb

## 2015-12-03 DIAGNOSIS — E790 Hyperuricemia without signs of inflammatory arthritis and tophaceous disease: Secondary | ICD-10-CM

## 2015-12-03 DIAGNOSIS — C8190 Hodgkin lymphoma, unspecified, unspecified site: Secondary | ICD-10-CM | POA: Insufficient documentation

## 2015-12-03 DIAGNOSIS — D801 Nonfamilial hypogammaglobulinemia: Secondary | ICD-10-CM | POA: Diagnosis not present

## 2015-12-03 DIAGNOSIS — Z23 Encounter for immunization: Secondary | ICD-10-CM | POA: Diagnosis not present

## 2015-12-03 DIAGNOSIS — C9110 Chronic lymphocytic leukemia of B-cell type not having achieved remission: Secondary | ICD-10-CM | POA: Diagnosis not present

## 2015-12-03 LAB — COMPREHENSIVE METABOLIC PANEL
ALT: 14 U/L — AB (ref 17–63)
AST: 24 U/L (ref 15–41)
Albumin: 3.2 g/dL — ABNORMAL LOW (ref 3.5–5.0)
Alkaline Phosphatase: 87 U/L (ref 38–126)
Anion gap: 7 (ref 5–15)
BUN: 10 mg/dL (ref 6–20)
CHLORIDE: 107 mmol/L (ref 101–111)
CO2: 26 mmol/L (ref 22–32)
CREATININE: 1.15 mg/dL (ref 0.61–1.24)
Calcium: 8.5 mg/dL — ABNORMAL LOW (ref 8.9–10.3)
GFR calc non Af Amer: >60 mL/min (ref >60)
Glucose, Bld: 118 mg/dL — ABNORMAL HIGH (ref 65–99)
POTASSIUM: 3.5 mmol/L (ref 3.5–5.1)
SODIUM: 140 mmol/L (ref 135–145)
Total Bilirubin: 0.4 mg/dL (ref 0.3–1.2)
Total Protein: 5.5 g/dL — ABNORMAL LOW (ref 6.5–8.1)

## 2015-12-03 LAB — CBC WITH DIFFERENTIAL/PLATELET
BAND NEUTROPHILS: 10 %
BASOS ABS: 0 10*3/uL (ref 0.0–0.1)
Basophils Relative: 0 %
Blasts: 0 %
EOS PCT: 13 %
Eosinophils Absolute: 1 10*3/uL — ABNORMAL HIGH (ref 0.0–0.7)
HCT: 30.4 % — ABNORMAL LOW (ref 39.0–52.0)
Hemoglobin: 10.2 g/dL — ABNORMAL LOW (ref 13.0–17.0)
LYMPHS ABS: 1.7 10*3/uL (ref 0.7–4.0)
Lymphocytes Relative: 22 %
MCH: 33.6 pg (ref 26.0–34.0)
MCHC: 33.6 g/dL (ref 30.0–36.0)
MCV: 100 fL (ref 78.0–100.0)
METAMYELOCYTES PCT: 2 %
MONO ABS: 0.1 10*3/uL (ref 0.1–1.0)
MONOS PCT: 1 %
Myelocytes: 0 %
Neutro Abs: 4.9 10*3/uL (ref 1.7–7.7)
Neutrophils Relative %: 52 %
PLATELETS: 98 10*3/uL — AB (ref 150–400)
Promyelocytes Absolute: 0 %
RBC: 3.04 MIL/uL — ABNORMAL LOW (ref 4.22–5.81)
RDW: 16.5 % — AB (ref 11.5–15.5)
WBC: 7.7 10*3/uL (ref 4.0–10.5)
nRBC: 0 /100 WBC

## 2015-12-03 LAB — PHOSPHORUS: Phosphorus: 3.6 mg/dL (ref 2.5–4.6)

## 2015-12-03 LAB — URIC ACID: URIC ACID, SERUM: 5.9 mg/dL (ref 4.4–7.6)

## 2015-12-03 MED ORDER — HEPARIN SOD (PORK) LOCK FLUSH 100 UNIT/ML IV SOLN
500.0000 [IU] | Freq: Once | INTRAVENOUS | Status: AC
  Administered 2015-12-03: 500 [IU] via INTRAVENOUS

## 2015-12-03 MED ORDER — IMMUNE GLOBULIN (HUMAN) 20 GM/200ML IV SOLN
0.4000 g/kg | Freq: Once | INTRAVENOUS | Status: AC
  Administered 2015-12-03: 40 g via INTRAVENOUS
  Filled 2015-12-03: qty 400

## 2015-12-03 MED ORDER — ACETAMINOPHEN 325 MG PO TABS
650.0000 mg | ORAL_TABLET | Freq: Four times a day (QID) | ORAL | Status: DC | PRN
  Administered 2015-12-03: 650 mg via ORAL
  Filled 2015-12-03: qty 2

## 2015-12-03 MED ORDER — DEXTROSE 5 % IV SOLN
INTRAVENOUS | Status: DC
  Administered 2015-12-03: 10:00:00 via INTRAVENOUS

## 2015-12-03 MED ORDER — DIPHENHYDRAMINE HCL 25 MG PO CAPS
25.0000 mg | ORAL_CAPSULE | Freq: Once | ORAL | Status: AC
Start: 2015-12-03 — End: 2015-12-03
  Administered 2015-12-03: 25 mg via ORAL
  Filled 2015-12-03: qty 1

## 2015-12-03 MED ORDER — DIPHENHYDRAMINE HCL 25 MG PO TABS
25.0000 mg | ORAL_TABLET | Freq: Once | ORAL | Status: DC
  Filled 2015-12-03: qty 1

## 2015-12-03 MED ORDER — HEPARIN SOD (PORK) LOCK FLUSH 100 UNIT/ML IV SOLN
INTRAVENOUS | Status: AC
  Filled 2015-12-03: qty 5

## 2015-12-03 MED ORDER — SODIUM CHLORIDE 0.9 % IV SOLN: Freq: Once | INTRAVENOUS | Status: DC

## 2015-12-03 NOTE — Progress Notes (Signed)
Dr. Whitney Muse notified of platelet count of 98,000.  Will hold chemo today per MD.   1155:  Tolerated infusion w/o adverse reaction.  Alert, in no distress.  VSS.  Discharged ambulatory.

## 2015-12-03 NOTE — Patient Instructions (Addendum)
[facility] Discharge Instructions for Patients Receiving Chemotherapy   Beginning January 23rd 2017 lab work for the [facility] will be done in the  Main lab at [facility] on 1st floor. If you have a lab appointment with the [facility] please come in thru the  Main Entrance and check in at the main information desk   Today you received the following chemotherapy agents:  Chemotherapy held today due to your low platelet count.  Return as scheduled next week for chemotherapy.  IVIG given today.  If you develop nausea and vomiting, or diarrhea that is not controlled by your medication, call the clinic.  The clinic phone number is [phone]. Office hours are Monday-Friday 8:30am-5:00pm.  BELOW ARE SYMPTOMS THAT SHOULD BE REPORTED IMMEDIATELY:  *FEVER GREATER THAN 101.0 F  *CHILLS WITH OR WITHOUT FEVER  NAUSEA AND VOMITING THAT IS NOT CONTROLLED WITH YOUR NAUSEA MEDICATION  *UNUSUAL SHORTNESS OF BREATH  *UNUSUAL BRUISING OR BLEEDING  TENDERNESS IN MOUTH AND THROAT WITH OR WITHOUT PRESENCE OF ULCERS  *URINARY PROBLEMS  *BOWEL PROBLEMS  UNUSUAL RASH Items with * indicate a potential emergency and should be followed up as soon as possible. If you have an emergency after office hours please contact your primary care physician or go to the nearest emergency department.  Please call the clinic during office hours if you have any questions or concerns.   You may also contact the Patient Navigator at [phone] should you have any questions or need assistance in obtaining follow up care.      Resources For Cancer Patients and their Caregivers ? American Cancer Society: Can assist with transportation, wigs, general needs, runs Look Good Feel Better.        708-767-2727 ? Cancer Care: Provides financial assistance, online support groups, medication/co-pay assistance.  1-800-813-HOPE 860-502-9306) ? [place] Assists [place]  cancer patients and their families through emotional , educational and financial support.  386-506-8957 ? [place] DSS Where to apply for food stamps, Medicaid and utility assistance. 504 684 8849 ? RCATS: Transportation to medical appointments. [phone] ? Social Security Administration: May apply for disability if have a Stage IV cancer. [phone] [phone] ? [employer], Disability and Transit Services: Assists with nutrition, care and transit needs. [phone]

## 2015-12-04 ENCOUNTER — Inpatient Hospital Stay (HOSPITAL_COMMUNITY): Payer: Medicare Other

## 2015-12-08 ENCOUNTER — Encounter (HOSPITAL_BASED_OUTPATIENT_CLINIC_OR_DEPARTMENT_OTHER): Payer: Medicare Other

## 2015-12-08 ENCOUNTER — Ambulatory Visit (HOSPITAL_COMMUNITY): Payer: Medicare Other

## 2015-12-08 ENCOUNTER — Other Ambulatory Visit (HOSPITAL_COMMUNITY): Payer: Self-pay | Admitting: Oncology

## 2015-12-08 ENCOUNTER — Encounter (HOSPITAL_COMMUNITY): Payer: Self-pay

## 2015-12-08 VITALS — BP 123/60 | HR 81 | Temp 98.3°F | Resp 16 | Wt 220.6 lb

## 2015-12-08 DIAGNOSIS — C9110 Chronic lymphocytic leukemia of B-cell type not having achieved remission: Secondary | ICD-10-CM

## 2015-12-08 DIAGNOSIS — E876 Hypokalemia: Secondary | ICD-10-CM

## 2015-12-08 DIAGNOSIS — Z5111 Encounter for antineoplastic chemotherapy: Secondary | ICD-10-CM | POA: Diagnosis not present

## 2015-12-08 DIAGNOSIS — Z23 Encounter for immunization: Secondary | ICD-10-CM | POA: Diagnosis not present

## 2015-12-08 DIAGNOSIS — C8190 Hodgkin lymphoma, unspecified, unspecified site: Secondary | ICD-10-CM | POA: Diagnosis not present

## 2015-12-08 DIAGNOSIS — Z5112 Encounter for antineoplastic immunotherapy: Secondary | ICD-10-CM | POA: Diagnosis not present

## 2015-12-08 DIAGNOSIS — E790 Hyperuricemia without signs of inflammatory arthritis and tophaceous disease: Secondary | ICD-10-CM

## 2015-12-08 LAB — COMPREHENSIVE METABOLIC PANEL
ALBUMIN: 3 g/dL — AB (ref 3.5–5.0)
ALT: 16 U/L — ABNORMAL LOW (ref 17–63)
AST: 28 U/L (ref 15–41)
Alkaline Phosphatase: 90 U/L (ref 38–126)
Anion gap: 3 — ABNORMAL LOW (ref 5–15)
BILIRUBIN TOTAL: 0.4 mg/dL (ref 0.3–1.2)
BUN: 8 mg/dL (ref 6–20)
CHLORIDE: 109 mmol/L (ref 101–111)
CO2: 24 mmol/L (ref 22–32)
Calcium: 7.9 mg/dL — ABNORMAL LOW (ref 8.9–10.3)
Creatinine, Ser: 1.08 mg/dL (ref 0.61–1.24)
GFR calc Af Amer: >60 mL/min (ref >60)
GFR calc non Af Amer: >60 mL/min (ref >60)
GLUCOSE: 123 mg/dL — AB (ref 65–99)
POTASSIUM: 3.3 mmol/L — AB (ref 3.5–5.1)
SODIUM: 136 mmol/L (ref 135–145)
TOTAL PROTEIN: 5.9 g/dL — AB (ref 6.5–8.1)

## 2015-12-08 LAB — CBC WITH DIFFERENTIAL/PLATELET
BASOS ABS: 0.1 10*3/uL (ref 0.0–0.1)
Basophils Relative: 1 %
EOS ABS: 0.9 10*3/uL — AB (ref 0.0–0.7)
Eosinophils Relative: 13 %
HEMATOCRIT: 30.8 % — AB (ref 39.0–52.0)
HEMOGLOBIN: 10.1 g/dL — AB (ref 13.0–17.0)
LYMPHS PCT: 41 %
Lymphs Abs: 2.7 10*3/uL (ref 0.7–4.0)
MCH: 33.2 pg (ref 26.0–34.0)
MCHC: 32.8 g/dL (ref 30.0–36.0)
MCV: 101.3 fL — ABNORMAL HIGH (ref 78.0–100.0)
MONO ABS: 0.7 10*3/uL (ref 0.1–1.0)
Monocytes Relative: 11 %
NEUTROS ABS: 2.2 10*3/uL (ref 1.7–7.7)
NEUTROS PCT: 34 %
Platelets: 105 10*3/uL — ABNORMAL LOW (ref 150–400)
RBC: 3.04 MIL/uL — ABNORMAL LOW (ref 4.22–5.81)
RDW: 16.2 % — AB (ref 11.5–15.5)
WBC: 6.6 10*3/uL (ref 4.0–10.5)

## 2015-12-08 LAB — URIC ACID: Uric Acid, Serum: 6.9 mg/dL (ref 4.4–7.6)

## 2015-12-08 LAB — PHOSPHORUS: Phosphorus: 3.2 mg/dL (ref 2.5–4.6)

## 2015-12-08 MED ORDER — POTASSIUM CHLORIDE CRYS ER 20 MEQ PO TBCR
40.0000 meq | EXTENDED_RELEASE_TABLET | Freq: Once | ORAL | Status: AC
  Administered 2015-12-08: 40 meq via ORAL

## 2015-12-08 MED ORDER — SODIUM CHLORIDE 0.9 % IV SOLN
Freq: Once | INTRAVENOUS | Status: AC
  Administered 2015-12-08: 12:00:00 via INTRAVENOUS

## 2015-12-08 MED ORDER — DIPHENHYDRAMINE HCL 25 MG PO CAPS
50.0000 mg | ORAL_CAPSULE | Freq: Once | ORAL | Status: AC
  Administered 2015-12-08: 50 mg via ORAL
  Filled 2015-12-08: qty 2

## 2015-12-08 MED ORDER — SODIUM CHLORIDE 0.9 % IV SOLN
500.0000 mg/m2 | Freq: Once | INTRAVENOUS | Status: AC
  Administered 2015-12-08: 1100 mg via INTRAVENOUS
  Filled 2015-12-08: qty 100

## 2015-12-08 MED ORDER — SODIUM CHLORIDE 0.9 % IV SOLN
90.0000 mg/m2 | Freq: Once | INTRAVENOUS | Status: AC
  Administered 2015-12-08: 200 mg via INTRAVENOUS
  Filled 2015-12-08: qty 8

## 2015-12-08 MED ORDER — ACETAMINOPHEN 325 MG PO TABS
650.0000 mg | ORAL_TABLET | Freq: Once | ORAL | Status: AC
  Administered 2015-12-08: 650 mg via ORAL

## 2015-12-08 MED ORDER — SODIUM CHLORIDE 0.9% FLUSH
10.0000 mL | INTRAVENOUS | Status: DC | PRN
  Administered 2015-12-08: 10 mL
  Filled 2015-12-08: qty 10

## 2015-12-08 MED ORDER — POTASSIUM CHLORIDE CRYS ER 20 MEQ PO TBCR: 40.0000 meq | EXTENDED_RELEASE_TABLET | Freq: Every day | ORAL | 1 refills | Status: DC

## 2015-12-08 MED ORDER — ACETAMINOPHEN 325 MG PO TABS
650.0000 mg | ORAL_TABLET | Freq: Once | ORAL | Status: AC
  Administered 2015-12-08: 650 mg via ORAL
  Filled 2015-12-08: qty 2

## 2015-12-08 MED ORDER — ALLOPURINOL 300 MG PO TABS: 300.0000 mg | ORAL_TABLET | Freq: Two times a day (BID) | ORAL | 3 refills | Status: DC

## 2015-12-08 MED ORDER — HEPARIN SOD (PORK) LOCK FLUSH 100 UNIT/ML IV SOLN
500.0000 [IU] | Freq: Once | INTRAVENOUS | Status: AC | PRN
  Administered 2015-12-08: 500 [IU]
  Filled 2015-12-08: qty 5

## 2015-12-08 MED ORDER — ACETAMINOPHEN 325 MG PO TABS
ORAL_TABLET | ORAL | Status: AC
  Filled 2015-12-08: qty 2

## 2015-12-08 MED ORDER — POTASSIUM CHLORIDE CRYS ER 20 MEQ PO TBCR
40.0000 meq | EXTENDED_RELEASE_TABLET | Freq: Once | ORAL | Status: AC
  Filled 2015-12-08: qty 2

## 2015-12-08 MED ORDER — PALONOSETRON HCL INJECTION 0.25 MG/5ML
0.2500 mg | Freq: Once | INTRAVENOUS | Status: AC
Start: 2015-12-08 — End: 2015-12-08
  Administered 2015-12-08: 0.25 mg via INTRAVENOUS
  Filled 2015-12-08: qty 5

## 2015-12-08 MED ORDER — SODIUM CHLORIDE 0.9 % IV SOLN
10.0000 mg | Freq: Once | INTRAVENOUS | Status: AC
  Administered 2015-12-08: 10 mg via INTRAVENOUS
  Filled 2015-12-08: qty 1

## 2015-12-08 NOTE — Patient Instructions (Signed)
[facility] Discharge Instructions for Patients Receiving Chemotherapy   Beginning January 23rd 2017 lab work for the [facility] will be done in the  Main lab at [facility] on 1st floor. If you have a lab appointment with the [facility] please come in thru the  Main Entrance and check in at the main information desk   Today you received the following chemotherapy agents Tituxan and bendamustine.  To help prevent nausea and vomiting after your treatment, we encourage you to take your nausea medication      If you develop nausea and vomiting, or diarrhea that is not controlled by your medication, call the clinic.  The clinic phone number is [phone]. Office hours are Monday-Friday 8:30am-5:00pm.  BELOW ARE SYMPTOMS THAT SHOULD BE REPORTED IMMEDIATELY:  *FEVER GREATER THAN 101.0 F  *CHILLS WITH OR WITHOUT FEVER  NAUSEA AND VOMITING THAT IS NOT CONTROLLED WITH YOUR NAUSEA MEDICATION  *UNUSUAL SHORTNESS OF BREATH  *UNUSUAL BRUISING OR BLEEDING  TENDERNESS IN MOUTH AND THROAT WITH OR WITHOUT PRESENCE OF ULCERS  *URINARY PROBLEMS  *BOWEL PROBLEMS  UNUSUAL RASH Items with * indicate a potential emergency and should be followed up as soon as possible. If you have an emergency after office hours please contact your primary care physician or go to the nearest emergency department.  Please call the clinic during office hours if you have any questions or concerns.   You may also contact the Patient Navigator at [phone] should you have any questions or need assistance in obtaining follow up care.      Resources For Cancer Patients and their Caregivers ? American Cancer Society: Can assist with transportation, wigs, general needs, runs Look Good Feel Better.        [phone] ? Cancer Care: Provides financial assistance, online support groups, medication/co-pay assistance.  1-800-813-HOPE (701) 437-1030) ? [place] Assists  [place] cancer patients and their families through emotional , educational and financial support.  ([phone] [phone] ? [place] DSS Where to apply for food stamps, Medicaid and utility assistance. 215-228-0852 ? RCATS: Transportation to medical appointments. 502-803-5707 ? Social Security Administration: May apply for disability if have a Stage IV cancer. [phone] 575-027-6863 ? [employer], Disability and Transit Services: Assists with nutrition, care and transit needs. [phone]

## 2015-12-08 NOTE — Progress Notes (Signed)
Patient not a candidate for flu shot due to receiving rituxan.   Labs reviewed with MD, proceed with treatment.   Chemo given today per orders. Patient tolerated it well without problems. Vitals stable, discharged from clinic ambulatory.

## 2015-12-09 ENCOUNTER — Ambulatory Visit (HOSPITAL_COMMUNITY): Payer: Medicare Other | Admitting: Hematology & Oncology

## 2015-12-09 ENCOUNTER — Ambulatory Visit (HOSPITAL_COMMUNITY): Payer: Medicare Other

## 2015-12-09 ENCOUNTER — Encounter (HOSPITAL_BASED_OUTPATIENT_CLINIC_OR_DEPARTMENT_OTHER): Payer: Medicare Other

## 2015-12-09 VITALS — BP 113/55 | HR 86 | Temp 98.0°F | Resp 18 | Wt 221.6 lb

## 2015-12-09 DIAGNOSIS — C9110 Chronic lymphocytic leukemia of B-cell type not having achieved remission: Secondary | ICD-10-CM | POA: Diagnosis not present

## 2015-12-09 DIAGNOSIS — Z5189 Encounter for other specified aftercare: Secondary | ICD-10-CM

## 2015-12-09 DIAGNOSIS — Z5111 Encounter for antineoplastic chemotherapy: Secondary | ICD-10-CM

## 2015-12-09 MED ORDER — HEPARIN SOD (PORK) LOCK FLUSH 100 UNIT/ML IV SOLN
INTRAVENOUS | Status: AC
  Filled 2015-12-09: qty 5

## 2015-12-09 MED ORDER — SODIUM CHLORIDE 0.9 % IV SOLN
10.0000 mg | Freq: Once | INTRAVENOUS | Status: AC
  Administered 2015-12-09: 10 mg via INTRAVENOUS
  Filled 2015-12-09: qty 1

## 2015-12-09 MED ORDER — PEGFILGRASTIM 6 MG/0.6ML ~~LOC~~ PSKT
PREFILLED_SYRINGE | SUBCUTANEOUS | Status: AC
  Filled 2015-12-09: qty 0.6

## 2015-12-09 MED ORDER — PEGFILGRASTIM 6 MG/0.6ML ~~LOC~~ PSKT
6.0000 mg | PREFILLED_SYRINGE | Freq: Once | SUBCUTANEOUS | Status: AC
  Administered 2015-12-09: 6 mg via SUBCUTANEOUS

## 2015-12-09 MED ORDER — SODIUM CHLORIDE 0.9% FLUSH: 10.0000 mL | INTRAVENOUS | Status: DC | PRN

## 2015-12-09 MED ORDER — BENDAMUSTINE HCL CHEMO INJECTION 100 MG/4ML
90.0000 mg/m2 | Freq: Once | INTRAVENOUS | Status: AC
  Administered 2015-12-09: 200 mg via INTRAVENOUS
  Filled 2015-12-09: qty 8

## 2015-12-09 MED ORDER — SODIUM CHLORIDE 0.9 % IV SOLN
Freq: Once | INTRAVENOUS | Status: AC
  Administered 2015-12-09: 14:00:00 via INTRAVENOUS

## 2015-12-09 MED ORDER — HEPARIN SOD (PORK) LOCK FLUSH 100 UNIT/ML IV SOLN
500.0000 [IU] | Freq: Once | INTRAVENOUS | Status: AC | PRN
  Administered 2015-12-09: 500 [IU]

## 2015-12-09 NOTE — Patient Instructions (Signed)
[facility] Discharge Instructions for Patients Receiving Chemotherapy   Beginning January 23rd 2017 lab work for the [facility] will be done in the  Main lab at [facility] on 1st floor. If you have a lab appointment with the [place] please come in thru the  Main Entrance and check in at the main information desk   Today you received the following chemotherapy agent: Bendamustine.     If you develop nausea and vomiting, or diarrhea that is not controlled by your medication, call the clinic.  The clinic phone number is [phone]. Office hours are Monday-Friday 8:30am-5:00pm.  BELOW ARE SYMPTOMS THAT SHOULD BE REPORTED IMMEDIATELY:  *FEVER GREATER THAN 101.0 F  *CHILLS WITH OR WITHOUT FEVER  NAUSEA AND VOMITING THAT IS NOT CONTROLLED WITH YOUR NAUSEA MEDICATION  *UNUSUAL SHORTNESS OF BREATH  *UNUSUAL BRUISING OR BLEEDING  TENDERNESS IN MOUTH AND THROAT WITH OR WITHOUT PRESENCE OF ULCERS  *URINARY PROBLEMS  *BOWEL PROBLEMS  UNUSUAL RASH Items with * indicate a potential emergency and should be followed up as soon as possible. If you have an emergency after office hours please contact your primary care physician or go to the nearest emergency department.  Please call the clinic during office hours if you have any questions or concerns.   You may also contact the Patient Navigator at [phone] should you have any questions or need assistance in obtaining follow up care.      Resources For Cancer Patients and their Caregivers ? American Cancer Society: Can assist with transportation, wigs, general needs, runs Look Good Feel Better.        437-487-5203 ? Cancer Care: Provides financial assistance, online support groups, medication/co-pay assistance.  1-800-813-HOPE (915)262-7762) ? [place] Assists [place] cancer patients and their families through emotional , educational and financial support.   9730606071 ? [place] DSS Where to apply for food stamps, Medicaid and utility assistance. 949-418-7477 ? RCATS: Transportation to medical appointments. 5816270673 ? Social Security Administration: May apply for disability if have a Stage IV cancer. [phone] ? [employer], Disability and Transit Services: Assists with nutrition, care and transit needs. [phone]

## 2015-12-09 NOTE — Progress Notes (Signed)
..  [name] arrived today for chemotherapy and ONPRO neulasta on body injector. See MAR for administration details. Injector in place and engaged with green light indicator on flashing. Tolerated application with out problems.  Tolerated infusion well.  VSS.  Patient ambulatory and stable upon discharge.

## 2015-12-10 ENCOUNTER — Inpatient Hospital Stay (HOSPITAL_COMMUNITY): Payer: Medicare Other

## 2015-12-16 ENCOUNTER — Ambulatory Visit (HOSPITAL_COMMUNITY): Payer: Medicare Other | Admitting: Hematology & Oncology

## 2015-12-16 ENCOUNTER — Ambulatory Visit (HOSPITAL_COMMUNITY): Payer: Medicare Other

## 2015-12-17 ENCOUNTER — Inpatient Hospital Stay (HOSPITAL_COMMUNITY): Payer: Medicare Other

## 2015-12-23 ENCOUNTER — Ambulatory Visit (HOSPITAL_COMMUNITY): Payer: Medicare Other | Admitting: Hematology & Oncology

## 2015-12-23 ENCOUNTER — Ambulatory Visit (HOSPITAL_COMMUNITY): Payer: Medicare Other

## 2015-12-24 ENCOUNTER — Inpatient Hospital Stay (HOSPITAL_COMMUNITY): Payer: Medicare Other

## 2015-12-26 ENCOUNTER — Ambulatory Visit (HOSPITAL_COMMUNITY): Payer: Medicare Other

## 2015-12-30 ENCOUNTER — Ambulatory Visit (HOSPITAL_COMMUNITY): Payer: Medicare Other

## 2015-12-31 ENCOUNTER — Ambulatory Visit (HOSPITAL_COMMUNITY): Payer: Medicare Other

## 2016-01-05 ENCOUNTER — Encounter (HOSPITAL_COMMUNITY): Payer: Medicare Other | Attending: Oncology

## 2016-01-05 ENCOUNTER — Encounter (HOSPITAL_COMMUNITY): Payer: Self-pay

## 2016-01-05 VITALS — BP 108/53 | HR 82 | Temp 98.0°F | Resp 18 | Wt 207.2 lb

## 2016-01-05 DIAGNOSIS — Z23 Encounter for immunization: Secondary | ICD-10-CM | POA: Insufficient documentation

## 2016-01-05 DIAGNOSIS — C9110 Chronic lymphocytic leukemia of B-cell type not having achieved remission: Secondary | ICD-10-CM | POA: Insufficient documentation

## 2016-01-05 DIAGNOSIS — C8190 Hodgkin lymphoma, unspecified, unspecified site: Secondary | ICD-10-CM | POA: Diagnosis not present

## 2016-01-05 LAB — COMPREHENSIVE METABOLIC PANEL
ALT: 17 U/L (ref 17–63)
AST: 33 U/L (ref 15–41)
Albumin: 3 g/dL — ABNORMAL LOW (ref 3.5–5.0)
Alkaline Phosphatase: 118 U/L (ref 38–126)
Anion gap: 6 (ref 5–15)
BILIRUBIN TOTAL: 0.5 mg/dL (ref 0.3–1.2)
BUN: 17 mg/dL (ref 6–20)
CO2: 22 mmol/L (ref 22–32)
CREATININE: 1.39 mg/dL — AB (ref 0.61–1.24)
Calcium: 8.4 mg/dL — ABNORMAL LOW (ref 8.9–10.3)
Chloride: 105 mmol/L (ref 101–111)
GFR calc Af Amer: 57 mL/min — ABNORMAL LOW (ref >60)
GFR, EST NON AFRICAN AMERICAN: 49 mL/min — AB (ref >60)
Glucose, Bld: 130 mg/dL — ABNORMAL HIGH (ref 65–99)
Potassium: 3.8 mmol/L (ref 3.5–5.1)
Sodium: 133 mmol/L — ABNORMAL LOW (ref 135–145)
TOTAL PROTEIN: 5.6 g/dL — AB (ref 6.5–8.1)

## 2016-01-05 LAB — CBC WITH DIFFERENTIAL/PLATELET
BASOS ABS: 0 10*3/uL (ref 0.0–0.1)
Basophils Relative: 1 %
Eosinophils Absolute: 0.6 10*3/uL (ref 0.0–0.7)
Eosinophils Relative: 16 %
HEMATOCRIT: 29.3 % — AB (ref 39.0–52.0)
Hemoglobin: 9.8 g/dL — ABNORMAL LOW (ref 13.0–17.0)
LYMPHS ABS: 0.9 10*3/uL (ref 0.7–4.0)
LYMPHS PCT: 23 %
MCH: 32.6 pg (ref 26.0–34.0)
MCHC: 33.4 g/dL (ref 30.0–36.0)
MCV: 97.3 fL (ref 78.0–100.0)
MONO ABS: 0.3 10*3/uL (ref 0.1–1.0)
Monocytes Relative: 8 %
NEUTROS ABS: 1.9 10*3/uL (ref 1.7–7.7)
Neutrophils Relative %: 51 %
Platelets: 82 10*3/uL — ABNORMAL LOW (ref 150–400)
RBC: 3.01 MIL/uL — AB (ref 4.22–5.81)
RDW: 16.6 % — AB (ref 11.5–15.5)
WBC: 3.7 10*3/uL — ABNORMAL LOW (ref 4.0–10.5)

## 2016-01-05 LAB — MAGNESIUM: Magnesium: 1.8 mg/dL (ref 1.7–2.4)

## 2016-01-05 LAB — LACTATE DEHYDROGENASE: LDH: 261 U/L — ABNORMAL HIGH (ref 98–192)

## 2016-01-05 LAB — URIC ACID: URIC ACID, SERUM: 4.1 mg/dL — AB (ref 4.4–7.6)

## 2016-01-05 MED ORDER — HEPARIN SOD (PORK) LOCK FLUSH 100 UNIT/ML IV SOLN
INTRAVENOUS | Status: AC
  Filled 2016-01-05: qty 5

## 2016-01-05 MED ORDER — HEPARIN SOD (PORK) LOCK FLUSH 100 UNIT/ML IV SOLN
500.0000 [IU] | Freq: Once | INTRAVENOUS | Status: AC
  Administered 2016-01-05: 500 [IU] via INTRAVENOUS

## 2016-01-05 NOTE — Progress Notes (Signed)
Labs reviewed with MD. Will hold chemo for one week. Follow up as scheduled.

## 2016-01-05 NOTE — Patient Instructions (Signed)
[place] at [facility] Discharge Instructions  RECOMMENDATIONS MADE BY THE CONSULTANT AND ANY TEST RESULTS WILL BE SENT TO YOUR REFERRING PHYSICIAN.  Chemotherapy held today for one week. Follow up as scheduled.  Thank you for choosing [place] at [facility] to provide your oncology and hematology care.  To afford each patient quality time with our provider, please arrive at least 15 minutes before your scheduled appointment time.   Beginning January 23rd 2017 lab work for the [facility] will be done in the  Main lab at [facility] on 1st floor. If you have a lab appointment with the [facility] please come in thru the  Main Entrance and check in at the main information desk  You need to re-schedule your appointment should you arrive 10 or more minutes late.  We strive to give you quality time with our providers, and arriving late affects you and other patients whose appointments are after yours.  Also, if you no show three or more times for appointments you may be dismissed from the clinic at the providers discretion.     Again, thank you for choosing [facility].  Our hope is that these requests will decrease the amount of time that you wait before being seen by our physicians.       _____________________________________________________________  Should you have questions after your visit to [facility], please contact our office at [phone] between the hours of 8:30 a.m. and 4:30 p.m.  Voicemails left after 4:30 p.m. will not be returned until the following business day.  For prescription refill requests, have your pharmacy contact our office.         Resources For Cancer Patients and their Caregivers ? American Cancer Society: Can assist with transportation, wigs, general needs, runs Look Good Feel Better.        539-193-7255 ? Cancer Care: Provides financial assistance, online support groups,  medication/co-pay assistance.  1-800-813-HOPE (864)716-6377) ? [place] Assists [place] cancer patients and their families through emotional , educational and financial support.  450-139-1580 ? [place] DSS Where to apply for food stamps, Medicaid and utility assistance. 772-812-9770 ? RCATS: Transportation to medical appointments. 630 179 4463 ? Social Security Administration: May apply for disability if have a Stage IV cancer. [phone] ? [employer], Disability and Transit Services: Assists with nutrition, care and transit needs. [place] Support Programs: @10RELATIVEDAYS @ > Cancer Support Group  2nd Tuesday of the month 1pm-2pm, Journey Room  > Creative Journey  3rd Tuesday of the month 1130am-1pm, Journey Room  > Look Good Feel Better  1st Wednesday of the month 10am-12 noon, Journey Room (Call [place] to register [phone])

## 2016-01-06 ENCOUNTER — Ambulatory Visit (HOSPITAL_COMMUNITY): Payer: Medicare Other

## 2016-01-06 ENCOUNTER — Ambulatory Visit (HOSPITAL_COMMUNITY): Payer: Medicare Other | Admitting: Oncology

## 2016-01-06 ENCOUNTER — Other Ambulatory Visit (HOSPITAL_COMMUNITY): Payer: Self-pay | Admitting: Oncology

## 2016-01-06 DIAGNOSIS — C9110 Chronic lymphocytic leukemia of B-cell type not having achieved remission: Secondary | ICD-10-CM

## 2016-01-13 ENCOUNTER — Encounter (HOSPITAL_BASED_OUTPATIENT_CLINIC_OR_DEPARTMENT_OTHER): Payer: Medicare Other

## 2016-01-13 ENCOUNTER — Encounter (HOSPITAL_COMMUNITY): Payer: Self-pay | Admitting: Oncology

## 2016-01-13 ENCOUNTER — Encounter: Payer: Self-pay | Admitting: Dietician

## 2016-01-13 ENCOUNTER — Encounter (HOSPITAL_BASED_OUTPATIENT_CLINIC_OR_DEPARTMENT_OTHER): Payer: Medicare Other | Admitting: Oncology

## 2016-01-13 VITALS — BP 95/51 | HR 89 | Temp 98.2°F | Resp 18 | Wt 205.8 lb

## 2016-01-13 DIAGNOSIS — Z23 Encounter for immunization: Secondary | ICD-10-CM | POA: Diagnosis not present

## 2016-01-13 DIAGNOSIS — D801 Nonfamilial hypogammaglobulinemia: Secondary | ICD-10-CM

## 2016-01-13 DIAGNOSIS — C9110 Chronic lymphocytic leukemia of B-cell type not having achieved remission: Secondary | ICD-10-CM

## 2016-01-13 DIAGNOSIS — C8190 Hodgkin lymphoma, unspecified, unspecified site: Secondary | ICD-10-CM | POA: Diagnosis not present

## 2016-01-13 LAB — COMPREHENSIVE METABOLIC PANEL
ALBUMIN: 2.9 g/dL — AB (ref 3.5–5.0)
ALT: 20 U/L (ref 17–63)
ANION GAP: 7 (ref 5–15)
AST: 34 U/L (ref 15–41)
Alkaline Phosphatase: 136 U/L — ABNORMAL HIGH (ref 38–126)
BILIRUBIN TOTAL: 0.5 mg/dL (ref 0.3–1.2)
BUN: 18 mg/dL (ref 6–20)
CHLORIDE: 105 mmol/L (ref 101–111)
CO2: 21 mmol/L — ABNORMAL LOW (ref 22–32)
Calcium: 8 mg/dL — ABNORMAL LOW (ref 8.9–10.3)
Creatinine, Ser: 1.53 mg/dL — ABNORMAL HIGH (ref 0.61–1.24)
GFR calc Af Amer: 51 mL/min — ABNORMAL LOW (ref >60)
GFR, EST NON AFRICAN AMERICAN: 44 mL/min — AB (ref >60)
Glucose, Bld: 119 mg/dL — ABNORMAL HIGH (ref 65–99)
POTASSIUM: 3.7 mmol/L (ref 3.5–5.1)
Sodium: 133 mmol/L — ABNORMAL LOW (ref 135–145)
TOTAL PROTEIN: 5.5 g/dL — AB (ref 6.5–8.1)

## 2016-01-13 LAB — CBC WITH DIFFERENTIAL/PLATELET
BASOS ABS: 0 10*3/uL (ref 0.0–0.1)
BASOS PCT: 0 %
EOS PCT: 12 %
Eosinophils Absolute: 0.7 10*3/uL (ref 0.0–0.7)
HCT: 30.5 % — ABNORMAL LOW (ref 39.0–52.0)
Hemoglobin: 10.3 g/dL — ABNORMAL LOW (ref 13.0–17.0)
Lymphocytes Relative: 21 %
Lymphs Abs: 1.3 10*3/uL (ref 0.7–4.0)
MCH: 32.5 pg (ref 26.0–34.0)
MCHC: 33.8 g/dL (ref 30.0–36.0)
MCV: 96.2 fL (ref 78.0–100.0)
MONO ABS: 0.4 10*3/uL (ref 0.1–1.0)
MONOS PCT: 7 %
Neutro Abs: 3.6 10*3/uL (ref 1.7–7.7)
Neutrophils Relative %: 59 %
PLATELETS: 90 10*3/uL — AB (ref 150–400)
RBC: 3.17 MIL/uL — ABNORMAL LOW (ref 4.22–5.81)
RDW: 16.8 % — AB (ref 11.5–15.5)
WBC: 6.1 10*3/uL (ref 4.0–10.5)

## 2016-01-13 LAB — URIC ACID: URIC ACID, SERUM: 5.8 mg/dL (ref 4.4–7.6)

## 2016-01-13 LAB — MAGNESIUM: Magnesium: 1.8 mg/dL (ref 1.7–2.4)

## 2016-01-13 LAB — LACTATE DEHYDROGENASE: LDH: 273 U/L — AB (ref 98–192)

## 2016-01-13 MED ORDER — HEPARIN SOD (PORK) LOCK FLUSH 100 UNIT/ML IV SOLN
500.0000 [IU] | Freq: Once | INTRAVENOUS | Status: AC
  Administered 2016-01-13: 500 [IU] via INTRAVENOUS

## 2016-01-13 MED ORDER — SODIUM CHLORIDE 0.9 % IV SOLN: Freq: Once | INTRAVENOUS | Status: DC

## 2016-01-13 MED ORDER — ONDANSETRON HCL 8 MG PO TABS: 8.0000 mg | ORAL_TABLET | Freq: Three times a day (TID) | ORAL | 2 refills | Status: DC | PRN

## 2016-01-13 MED ORDER — DIPHENHYDRAMINE HCL 25 MG PO TABS
25.0000 mg | ORAL_TABLET | Freq: Once | ORAL | Status: AC
  Administered 2016-01-13: 25 mg via ORAL
  Filled 2016-01-13 (×2): qty 1

## 2016-01-13 MED ORDER — IMMUNE GLOBULIN (HUMAN) 20 GM/200ML IV SOLN
0.4000 g/kg | Freq: Once | INTRAVENOUS | Status: AC
  Administered 2016-01-13: 35 g via INTRAVENOUS
  Filled 2016-01-13: qty 200

## 2016-01-13 MED ORDER — ACETAMINOPHEN 325 MG PO TABS
650.0000 mg | ORAL_TABLET | Freq: Four times a day (QID) | ORAL | Status: DC | PRN
  Administered 2016-01-13: 650 mg via ORAL
  Filled 2016-01-13: qty 2

## 2016-01-13 MED ORDER — HEPARIN SOD (PORK) LOCK FLUSH 100 UNIT/ML IV SOLN
INTRAVENOUS | Status: AC
  Filled 2016-01-13: qty 5

## 2016-01-13 MED ORDER — DEXTROSE 5 % IV SOLN
INTRAVENOUS | Status: DC
  Administered 2016-01-13: 10:00:00 via INTRAVENOUS

## 2016-01-13 MED ORDER — SODIUM CHLORIDE 0.9% FLUSH
10.0000 mL | INTRAVENOUS | Status: DC | PRN
  Administered 2016-01-13: 10 mL via INTRAVENOUS
  Filled 2016-01-13: qty 10

## 2016-01-13 NOTE — Patient Instructions (Addendum)
[place] at [facility] Discharge Instructions  RECOMMENDATIONS MADE BY THE CONSULTANT AND ANY TEST RESULTS WILL BE SENT TO YOUR REFERRING PHYSICIAN.  You were seen by [name] today. Defer treatment for 1 week Labs next week Return in 5 weeks for follow up and labs  Thank you for choosing [place] at [facility] to provide your oncology and hematology care.  To afford each patient quality time with our provider, please arrive at least 15 minutes before your scheduled appointment time.   Beginning January 23rd 2017 lab work for the [facility] will be done in the  Main lab at [facility] on 1st floor. If you have a lab appointment with the [facility] please come in thru the  Main Entrance and check in at the main information desk  You need to re-schedule your appointment should you arrive 10 or more minutes late.  We strive to give you quality time with our providers, and arriving late affects you and other patients whose appointments are after yours.  Also, if you no show three or more times for appointments you may be dismissed from the clinic at the providers discretion.     Again, thank you for choosing [facility].  Our hope is that these requests will decrease the amount of time that you wait before being seen by our physicians.       _____________________________________________________________  Should you have questions after your visit to [facility], please contact our office at [phone] between the hours of 8:30 a.m. and 4:30 p.m.  Voicemails left after 4:30 p.m. will not be returned until the following business day.  For prescription refill requests, have your pharmacy contact our office.         Resources For Cancer Patients and their Caregivers ? American Cancer Society: Can assist with transportation, wigs, general needs, runs Look Good Feel Better.        [phone] ? Cancer  Care: Provides financial assistance, online support groups, medication/co-pay assistance.  1-800-813-HOPE 8084649567) ? [place] Assists [place] cancer patients and their families through emotional , educational and financial support.  9563607484 ? [place] DSS Where to apply for food stamps, Medicaid and utility assistance. 312-501-7342 ? RCATS: Transportation to medical appointments. [phone] ? Social Security Administration: May apply for disability if have a Stage IV cancer. [phone] ? [employer], Disability and Transit Services: Assists with nutrition, care and transit needs. [place] Support Programs: @10RELATIVEDAYS @ > Cancer Support Group  2nd Tuesday of the month 1pm-2pm, Journey Room  > Creative Journey  3rd Tuesday of the month 1130am-1pm, Journey Room  > Look Good Feel Better  1st Wednesday of the month 10am-12 noon, Journey Room (Call [place] to register [phone])

## 2016-01-13 NOTE — Progress Notes (Signed)
Labs reviewed with MD, and will hold treatment today. Will give IVIG today per MD.

## 2016-01-13 NOTE — Progress Notes (Addendum)
[name] Nakayama, MD [address], [address] [place] [address]  Leukemia, lymphocytic, chronic [facility]) - Plan: ondansetron (ZOFRAN) 8 MG tablet  Hypogammaglobulinemia, acquired [facility])  CURRENT THERAPY: BR  INTERVAL HISTORY: CORTAVIUS BARRENTINE 70 y.o. 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 Now on systemic chemotherapy. AND History of Hodgkin's Lymphoma, treated in 2006. AND Hypogammaglobulenemia, started IVIG on 10/03/2015 due to recurrent URI. AND CKD, Stage III      Hodgkin's disease ([place])   10/02/2007 Initial Diagnosis    HODGKIN'S DISEASE       Leukemia, lymphocytic, chronic ([place])   07/22/2015 Procedure    IR bone marrow aspiration and biopsy      07/23/2015 Pathology Results    Bone Marrow, Aspirate,Biopsy, and Clot, left iliac crest - EXTENSIVE INVOLVEMENT BY CHRONIC LYMPHOCYTIC LEUKEMIA. PERIPHERAL BLOOD: - CHRONIC LYMPHOCYTIC LEUKEMIA. - NORMOCYTIC ANEMIA. - THROMBOCYTOPENIA.      07/23/2015 Pathology Results    Bone Marrow Flow Cytometry - CHRONIC LYMPHOCYTIC LEUKEMIA      08/20/2015 Imaging    CT CAP- Mediastinal and hilar lymphadenopathy consistent with CLL. No pulmonary lesions. Marked splenomegaly. Mild upper abdominal retroperitoneal adenopathy.      08/27/2015 Miscellaneous    Imbruvica therapy is cost-prohibitive.      09/08/2015 -  Chemotherapy    Bendamustine day 1+2 every 28 days and Rituxan Day 1 every 28 days.      10/03/2015 Survivorship    Monthly IVIG for recurrent URI in the setting of hypogammaglobulinemia.      11/04/2015 Treatment Plan Change    Treatment deferred due to thrombocytopenia       He is doing well.  He notes some nausea, but he is not taking his nausea medication correctly.  His weight is down and he was seen and evaluated by Burtis Junes, RD.  I wonder if this is from his nausea.     Review of Systems  Constitutional: Positive for weight loss. Negative for chills and fever.  HENT: Negative.   Eyes: Negative.   Respiratory: Negative.  Negative for cough.   Cardiovascular: Negative.  Negative for chest pain.  Gastrointestinal: Positive for nausea. Negative for vomiting.  Genitourinary: Negative.   Musculoskeletal: Negative.   Skin: Negative.   Neurological: Negative.   Endo/Heme/Allergies: Negative.   Psychiatric/Behavioral: Negative.     Past Medical History:  Diagnosis Date  . Acid reflux   . Aortic stenosis   . Arthritis   . Atrial fibrillation ([place])   . BPH (benign prostatic hypertrophy)   . CLL (chronic lymphoblastic leukemia)   . Coronary atherosclerosis of native coronary artery   . Essential hypertension, benign   . Hodgkin's disease   . HOH (hard of hearing)   . Hyperlipidemia   . Hypogammaglobulinemia ([place])   . Indirect inguinal hernia 04/13/2013  . Kidney stones   . Pre-diabetes 08/04/2011  . Wears dentures   . Wears glasses     Past Surgical History:  Procedure Laterality Date  . AORTIC VALVE REPLACEMENT  10/09   110mm Magna Pericardial   . BONE MARROW ASPIRATION    . CATARACT EXTRACTION W/PHACO Right 01/07/2014   Procedure: CATARACT EXTRACTION PHACO AND INTRAOCULAR LENS PLACEMENT ([place]);  Surgeon: Tonny Branch, MD;  Location: [facility];  Service: Ophthalmology;  Laterality: Right;  CDE:15.31  . CATARACT EXTRACTION W/PHACO Left 03/25/2014   Procedure: CATARACT EXTRACTION  PHACO AND INTRAOCULAR LENS PLACEMENT ([facility]);  Surgeon: Tonny Branch, MD;  Location: [facility];  Service: Ophthalmology;  Laterality: Left;  CDE 6.55  . COLON SURGERY    . CORONARY ARTERY BYPASS GRAFT  10/09   SVG to RCA  . DENTAL SURGERY  08/2012  . ESOPHAGOGASTRODUODENOSCOPY     scrapping of throat and stretching  . EYE SURGERY Right 02/07/2014   cataract  . LITHOTRIPSY    . MASS EXCISION N/A 12/05/2012   Procedure: EXCISION MIDLINE NECK MASS;  Surgeon: Ascencion Dike, MD;   Location: [place];  Service: ENT;  Laterality: N/A;  . MULTIPLE TOOTH EXTRACTIONS    . PORTACATH PLACEMENT    . TONGUE BIOPSY  12/2012    Family History  Problem Relation Age of Onset  . Heart failure Mother   . Blindness Mother   . Cancer Father 60    Lung   . Cancer Brother 26    acute leukemia     Social History   Social History  . Marital status: Widowed    Spouse name: N/A  . Number of children: N/A  . Years of education: N/A   Social History Main Topics  . Smoking status: Never Smoker  . Smokeless tobacco: Never Used     Comment: pt denies tobacco use   . Alcohol use No     Comment: pt denies alcohol   . Drug use: No  . Sexual activity: Not Currently   Other Topics Concern  . None   Social History Narrative  . None     PHYSICAL EXAMINATION  ECOG PERFORMANCE STATUS: 1 - Symptomatic but completely ambulatory  There were no vitals filed for this visit.  Vitals - 1 value per visit [id number]  SYSTOLIC 95  DIASTOLIC 51  Pulse 89  Temperature 98.2  Respirations 18    GENERAL:alert, no distress, well nourished, well developed, comfortable, cooperative, smiling and in chemo-recliner, unaccompanied. SKIN: skin color, texture, turgor are normal, no rashes or significant lesions HEAD: Normocephalic, No masses, lesions, tenderness or abnormalities EYES: normal, EOMI, Conjunctiva are pink and non-injected EARS: External ears normal OROPHARYNX:lips, buccal mucosa, and tongue normal  NECK: supple, trachea midline LYMPH:  no palpable lymphadenopathy BREAST:not examined LUNGS: clear to auscultation  HEART: regular rate & rhythm, musical murmur ABDOMEN:abdomen soft, non-tender and normal bowel sounds BACK: Back symmetric, no curvature. EXTREMITIES:less then 2 second capillary refill, no joint deformities, effusion, or inflammation, no skin discoloration, no cyanosis  NEURO: alert & oriented x 3 with fluent speech, no focal motor/sensory  deficits, gait normal   LABORATORY DATA: CBC    Component Value Date/Time   WBC 6.1 01/13/2016 0848   RBC 3.17 (L) 01/13/2016 0848   HGB 10.3 (L) 01/13/2016 0848   HCT 30.5 (L) 01/13/2016 0848   PLT 90 (L) 01/13/2016 0848   MCV 96.2 01/13/2016 0848   MCH 32.5 01/13/2016 0848   MCHC 33.8 01/13/2016 0848   RDW 16.8 (H) 01/13/2016 0848   LYMPHSABS 1.3 01/13/2016 0848   MONOABS 0.4 01/13/2016 0848   EOSABS 0.7 01/13/2016 0848   BASOSABS 0.0 01/13/2016 0848      Chemistry      Component Value Date/Time   NA 133 (L) 01/13/2016 0848   K 3.7 01/13/2016 0848   CL 105 01/13/2016 0848   CO2 21 (L) 01/13/2016 0848   BUN 18 01/13/2016 0848   CREATININE 1.53 (H) 01/13/2016 0848   CREATININE 1.59 (H) 06/10/2015 1601  Component Value Date/Time   CALCIUM 8.0 (L) 01/13/2016 0848   ALKPHOS 136 (H) 01/13/2016 0848   AST 34 01/13/2016 0848   ALT 20 01/13/2016 0848   BILITOT 0.5 01/13/2016 0848        PENDING LABS:   RADIOGRAPHIC STUDIES:  No results found.   PATHOLOGY:    ASSESSMENT AND PLAN:  Leukemia, lymphocytic, chronic (HCC) CLL, requiring treatment due to extensive involvement of CLL in bone marrow aspiration and biopsy in addition to climbing WBC and thrombocytopenia.  He began Bendamustine/Rituxan on 09/08/2015.  Multiple delays in treatment were necessary due to cytopenias.  Oncology history is updated.  Labs today: CBC diff, CMET, LDH, magnesium, phosphorus, uric acid.  I personally reviewed and went over laboratory results with the patient.  The results are noted within this dictation.  Platelet count is still below treatment parameters at 90,000.  Defer treatment x 1 week.  Labs in 5 weeks: CBC diff, CMET, phosphorus, magnesium, and uric acid.  Will provide additional Rasburicase if indicated.  Return in 5 weeks for follow-up.  Hypogammaglobulinemia, acquired ([place]) Hypogammaglobulinemia, secondary to CLL and past treatment of Hodgkin's Lymphoma.    On  monthly IVIG.  Will be given today.   ORDERS PLACED FOR THIS ENCOUNTER: No orders of the defined types were placed in this encounter.   MEDICATIONS PRESCRIBED THIS ENCOUNTER: Meds ordered this encounter  Medications  . ondansetron (ZOFRAN) 8 MG tablet    Sig: Take 1 tablet (8 mg total) by mouth every 8 (eight) hours as needed for nausea or vomiting.    Dispense:  30 tablet    Refill:  2    Order Specific Question:   Supervising Provider    Answer:   Patrici Ranks [id number]    THERAPY PLAN:  Hold chemotherapy x 1 week.  IVIG today.  All questions were answered. The patient knows to call the clinic with any problems, questions or concerns. We can certainly see the patient much sooner if necessary.  Patient and plan discussed with Dr. Ancil Linsey and she is in agreement with the aforementioned.   This note is electronically signed by: Doy Mince 01/13/2016 5:56 PM

## 2016-01-13 NOTE — Patient Instructions (Signed)
[place] at [facility] Discharge Instructions  RECOMMENDATIONS MADE BY THE CONSULTANT AND ANY TEST RESULTS WILL BE SENT TO YOUR REFERRING PHYSICIAN.  Received IVIG today. Chemotherapy treatment held today. Follow-up as scheduled. Call clinic for any questions or concerns  Thank you for choosing [place] at [facility] to provide your oncology and hematology care.  To afford each patient quality time with our provider, please arrive at least 15 minutes before your scheduled appointment time.   Beginning January 23rd 2017 lab work for the [facility] will be done in the  Main lab at [facility] on 1st floor. If you have a lab appointment with the [facility] please come in thru the  Main Entrance and check in at the main information desk  You need to re-schedule your appointment should you arrive 10 or more minutes late.  We strive to give you quality time with our providers, and arriving late affects you and other patients whose appointments are after yours.  Also, if you no show three or more times for appointments you may be dismissed from the clinic at the providers discretion.     Again, thank you for choosing [facility].  Our hope is that these requests will decrease the amount of time that you wait before being seen by our physicians.       _____________________________________________________________  Should you have questions after your visit to [facility], please contact our office at [phone] between the hours of 8:30 a.m. and 4:30 p.m.  Voicemails left after 4:30 p.m. will not be returned until the following business day.  For prescription refill requests, have your pharmacy contact our office.         Resources For Cancer Patients and their Caregivers ? American Cancer Society: Can assist with transportation, wigs, general needs, runs Look Good Feel Better.        224-435-6757 ? Cancer  Care: Provides financial assistance, online support groups, medication/co-pay assistance.  1-800-813-HOPE 734-739-4695) ? [place] Assists [place] cancer patients and their families through emotional , educational and financial support.  (830)711-1993 ? [place] DSS Where to apply for food stamps, Medicaid and utility assistance. 304-845-3277 ? [facility]: Transportation to medical appointments. 548-558-5544 ? Social Security Administration: May apply for disability if have a Stage IV cancer. 206-098-3827 830-488-9461 ? [employer], Disability and Transit Services: Assists with nutrition, care and transit needs. [place] Support Programs: @10RELATIVEDAYS @ > Cancer Support Group  2nd Tuesday of the month 1pm-2pm, Journey Room  > Creative Journey  3rd Tuesday of the month 1130am-1pm, Journey Room  > Look Good Feel Better  1st Wednesday of the month 10am-12 noon, Journey Room (Call [place] to register [phone])

## 2016-01-13 NOTE — Assessment & Plan Note (Addendum)
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    On monthly IVIG.  Will be given today.

## 2016-01-13 NOTE — Progress Notes (Signed)
[name] tolerated IVIG well without complaints or incident. Labs reviewed and chemo deferred until next week. Port flushed with 10 ml NS and 5 ml Heparin easily. VSS upon discharge. Pt discharged via wheelchair in satisfactory condition with son

## 2016-01-13 NOTE — Progress Notes (Signed)
Follow up with patient due to recent significant weight loss  Contacted Pt by Visiting during his infusion   Wt Readings from Last 10 Encounters:  01/13/16 205 lb 12.8 oz (93.4 kg)  01/05/16 207 lb 3.2 oz (94 kg)  12/09/15 221 lb 9.6 oz (100.5 kg)  12/08/15 220 lb 9.6 oz (100.1 kg)  12/03/15 219 lb 9.6 oz (99.6 kg)  11/25/15 216 lb (98 kg)  11/18/15 215 lb (97.5 kg)  11/11/15 216 lb 9.6 oz (98.2 kg)  11/04/15 213 lb 12.8 oz (97 kg)  10/31/15 212 lb 4.8 oz (96.3 kg)   Patient weight has decreased by 15 lbs in the last month. Patient himself says he does not know what has brought about the weight loss.   Patient reports oral intake as good, however he is suffering from symptoms including mild nausea and mild diarrhea.   Pt reports a very good appetite. He is eating 2-3 meals + snacks. This is more than what he had reported on initial assessment. His meals sound to always include a source of protein. His snacks, on the other hand, RE mostly carbs: Chips, cookies, crackers. He has not been drinking supplements on any routine basis. He noted an instance where he drank one and had diarrhea a short while later. However, he has instances of diarrhea off and on and does not know if this episode had anything to do with the Ensure.   Initially, pt reported he was eating the same at each meal, however later in the conversation, he said he gets mild postprandial nausea that negatively affects his intake. He had been taking zofran, but doesn't sound to me taking it now due to perceived lack of effectiveness.   Patient's meals sound good; he is adding butter and other additives to his items. RD asked more questions regarding provoking factors of his nausea and diarrhea. He said he drank the ensure that he had in 2-3 gulps. This may have brought on the diarrhea. It does not sound like it is the Ensure causing his diarrhea specifically. His nausea is not ideally controlled either.   RD discussed the  importance of sipping on the Ensure in between meals. He should not chug it as that could bring on an episode of diarrhea. He should be eating protein containing snacks instead of just chips/cracker. RD gave examples  RD offered him a case of Ensure to sip on between meals. He accepted and a case is left with him today.   RD to continue to monitor his weight.   Will speak with MD about altering his anti-emetic regimen to better control nausea.   Burtis Junes RD, LDN, [facility] Pager: [phone] 01/13/2016 10:12 AM

## 2016-01-13 NOTE — Assessment & Plan Note (Addendum)
CLL, requiring treatment due to extensive involvement of CLL in bone marrow aspiration and biopsy in addition to climbing WBC and thrombocytopenia.  He began Bendamustine/Rituxan on 09/08/2015.  Multiple delays in treatment were necessary due to cytopenias.  Oncology history is updated.  Labs today: CBC diff, CMET, LDH, magnesium, phosphorus, uric acid.  I personally reviewed and went over laboratory results with the patient.  The results are noted within this dictation.  Platelet count is still below treatment parameters at 90,000.  Defer treatment x 1 week.  Labs in 5 weeks: CBC diff, CMET, phosphorus, magnesium, and uric acid.  Will provide additional Rasburicase if indicated.  Return in 5 weeks for follow-up.

## 2016-01-14 ENCOUNTER — Ambulatory Visit (HOSPITAL_COMMUNITY): Payer: Medicare Other

## 2016-01-15 ENCOUNTER — Ambulatory Visit (INDEPENDENT_AMBULATORY_CARE_PROVIDER_SITE_OTHER): Payer: Medicare Other

## 2016-01-15 DIAGNOSIS — Z0000 Encounter for general adult medical examination without abnormal findings: Secondary | ICD-10-CM

## 2016-01-15 NOTE — Patient Instructions (Signed)
Thank you for choosing [facility] for your health care needs  The Annual Wellness Visit is designed to allow [place] the chance to assist you in preserving and improving you health.   Dr. Moshe Cipro will see you back in 3.5 months  If any labs are needed they will be mailed to you with the date to have them drawn  We will be in contact about your flu shot

## 2016-01-17 NOTE — Progress Notes (Addendum)
Subjective:    [name] is a 70 y.o. male who presents for Medicare Annual/Subsequent preventive examination.   Preventive Screening-Counseling & Management  Tobacco History  Smoking Status  . Never Smoker  Smokeless Tobacco  . Never Used    Comment: pt denies tobacco use     Current Problems (verified) Patient Active Problem List   Diagnosis Date Noted  . Recurrent falls while walking 06/14/2015  . Medicare annual wellness visit, subsequent 06/10/2015  . Carotid bruit 06/10/2015  . Acute bronchitis 06/10/2015  . Influenza with respiratory manifestation 06/10/2015  . Annual physical exam 10/10/2014  . Pigmented skin lesion 10/10/2014  . Chronic kidney disease (CKD) 05/10/2014  . Indirect inguinal hernia 04/13/2013  . Cholelithiasis 01/11/2013  . Neck mass 11/02/2012  . Reduced vision 10/23/2012  . Piles (hemorrhoids) 06/19/2012  . Dental caries 05/12/2012  . Pre-diabetes 08/04/2011  . Thrombocytopenia ([place]) 08/02/2011  . 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 Anemia 08/02/2011  . GERD (gastroesophageal reflux disease) 08/23/2010  . Essential hypertension, benign 05/06/2010  . Obesity 02/10/2010  . AORTIC VALVE REPLACEMENT, HX OF 04/23/2009  . CORONARY ATHEROSCLEROSIS NATIVE CORONARY ARTERY 02/26/2008  . Atrial fibrillation ([place]) 02/26/2008  . Aortic valve disorder 11/12/2007  . Hodgkin's disease ([place]) 10/02/2007  . 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 Mixed hyperlipidemia 04/07/2007    Medications Prior to Visit Current Outpatient Prescriptions on File Prior to Visit  Medication Sig Dispense Refill  . acetaminophen (TYLENOL) 500 MG tablet Take 1,000 mg by mouth every 6 (six) hours as needed for fever.     Marland Kitchen acyclovir (ZOVIRAX) 200 MG capsule TAKE ONE CAPSULE BY MOUTH TWICE DAILY 60 capsule 5  . allopurinol (ZYLOPRIM) 300 MG tablet Take 1 tablet (300 mg total) by mouth 2 (two) times daily. 60 tablet 3  . aspirin EC 81 MG tablet  Take 81 mg by mouth daily.    . BENDAMUSTINE HCL IV Inject into the vein. To start Tuesday 09/09/15    . folic acid (FOLVITE) 1 MG tablet TAKE ONE TABLET BY MOUTH ONCE DAILY 30 tablet 5  . metoprolol (LOPRESSOR) 50 MG tablet TAKE ONE TABLET BY MOUTH TWICE DAILY 60 tablet 6  . Multiple Vitamins-Minerals (CENTRUM SILVER ULTRA MENS PO) Take 1 tablet by mouth every morning.     Marland Kitchen OMEGA 3 1000 MG CAPS Take 1,000 mg by mouth daily.     Marland Kitchen omeprazole (PRILOSEC) 20 MG capsule Take 20 mg by mouth every morning.     . ondansetron (ZOFRAN) 8 MG tablet Take 1 tablet (8 mg total) by mouth every 8 (eight) hours as needed for nausea or vomiting. 30 tablet 2  . potassium chloride SA (K-DUR,KLOR-CON) 20 MEQ tablet Take 2 tablets (40 mEq total) by mouth daily. 60 tablet 1  . RiTUXimab (RITUXAN IV) Inject into the vein. To start Monday 09/08/15    . simvastatin (ZOCOR) 40 MG tablet Take 1 tablet (40 mg total) by mouth at bedtime. 90 tablet 3   Current Facility-Administered Medications on File Prior to Visit  Medication Dose Route Frequency Provider Last Rate Last Dose  . HYDROcodone-homatropine (HYCODAN) 5-1.5 MG/5ML syrup 5 mL  5 mL Oral Once Thomas S Kefalas, PA-C      . potassium chloride SA (K-DUR,KLOR-CON) CR tablet 40 mEq  40 mEq Oral Once Baird Cancer, PA-C        Current Medications (verified) Current Outpatient Prescriptions  Medication Sig Dispense Refill  . acetaminophen (TYLENOL) 500 MG tablet Take  1,000 mg by mouth every 6 (six) hours as needed for fever.     Marland Kitchen acyclovir (ZOVIRAX) 200 MG capsule TAKE ONE CAPSULE BY MOUTH TWICE DAILY 60 capsule 5  . allopurinol (ZYLOPRIM) 300 MG tablet Take 1 tablet (300 mg total) by mouth 2 (two) times daily. 60 tablet 3  . aspirin EC 81 MG tablet Take 81 mg by mouth daily.    . BENDAMUSTINE HCL IV Inject into the vein. To start Tuesday 09/09/15    . folic acid (FOLVITE) 1 MG tablet TAKE ONE TABLET BY MOUTH ONCE DAILY 30 tablet 5  . metoprolol (LOPRESSOR) 50 MG  tablet TAKE ONE TABLET BY MOUTH TWICE DAILY 60 tablet 6  . Multiple Vitamins-Minerals (CENTRUM SILVER ULTRA MENS PO) Take 1 tablet by mouth every morning.     Marland Kitchen OMEGA 3 1000 MG CAPS Take 1,000 mg by mouth daily.     Marland Kitchen omeprazole (PRILOSEC) 20 MG capsule Take 20 mg by mouth every morning.     . ondansetron (ZOFRAN) 8 MG tablet Take 1 tablet (8 mg total) by mouth every 8 (eight) hours as needed for nausea or vomiting. 30 tablet 2  . potassium chloride SA (K-DUR,KLOR-CON) 20 MEQ tablet Take 2 tablets (40 mEq total) by mouth daily. 60 tablet 1  . RiTUXimab (RITUXAN IV) Inject into the vein. To start Monday 09/08/15    . simvastatin (ZOCOR) 40 MG tablet Take 1 tablet (40 mg total) by mouth at bedtime. 90 tablet 3   No current facility-administered medications for this visit.    Facility-Administered Medications Ordered in Other Visits  Medication Dose Route Frequency Provider Last Rate Last Dose  . HYDROcodone-homatropine (HYCODAN) 5-1.5 MG/5ML syrup 5 mL  5 mL Oral Once Thomas S Kefalas, PA-C      . potassium chloride SA (K-DUR,KLOR-CON) CR tablet 40 mEq  40 mEq Oral Once Baird Cancer, PA-C         Allergies (verified) Azithromycin   PAST HISTORY  Family History Family History  Problem Relation Age of Onset  . Heart failure Mother   . Blindness Mother   . Cancer Father 31    Lung   . Cancer Brother 76    acute leukemia     Social History Social History  Substance Use Topics  . Smoking status: Never Smoker  . Smokeless tobacco: Never Used     Comment: pt denies tobacco use   . Alcohol use No     Comment: pt denies alcohol     Are there smokers in your home (other than you)?  No  Risk Factors Current exercise habits: The patient does not participate in regular exercise at present.  Dietary issues discussed: portion control, increasing fresh fruits and vegetables to aid immune system    Cardiac risk factors: advanced age (older than 22 for men, 69 for women),  dyslipidemia, hypertension and obesity (BMI >= 30 kg/m2).  Depression Screen (Note: if answer to either of the following is "Yes", a more complete depression screening is indicated)   Q1: Over the past two weeks, have you felt down, depressed or hopeless? No  Q2: Over the past two weeks, have you felt little interest or pleasure in doing things? No  Have you lost interest or pleasure in daily life? No  Do you often feel hopeless? No  Do you cry easily over simple problems? No  Activities of Daily Living In your present state of health, do you have any difficulty performing the following  activities?:  Driving? No Managing money?  No Feeding yourself? No Getting from bed to chair? No Climbing a flight of stairs? No Preparing food and eating?: No Bathing or showering? No Getting dressed: No Getting to the toilet? No Using the toilet:No Moving around from place to place: No In the past year have you fallen or had a near fall?:Yes   Are you sexually active?  No  Do you have more than one partner?  na  Hearing Difficulties: No Do you often ask people to speak up or repeat themselves? No Do you experience ringing or noises in your ears? No Do you have difficulty understanding soft or whispered voices? No   Do you feel that you have a problem with memory? No  Do you often misplace items? No  Do you feel safe at home?  Yes  Cognitive Testing  Alert? Yes  Normal Appearance?Yes  Oriented to person? Yes  Place? Yes   Time? Yes  Recall of three objects?  Yes  Can perform simple calculations? Yes  Displays appropriate judgment?Yes  Can read the correct time from a watch face?Yes   Advanced Directives have been discussed with the patient? No   List the Names of Other Physician/Practitioners you currently use: 1.  Dr. Whitney Muse (oncology)   Indicate any recent Medical Services you may have received from other than [facility] providers in the past year (date may be  approximate).  Immunization History  Administered Date(s) Administered  . H1N1 03/28/2008  . Influenza Split 02/04/2012  . Influenza Whole 01/05/2008, 12/31/2008, 02/05/2010  . Influenza,inj,Quad PF,36+ Mos 01/11/2013, 12/26/2013, 05/02/2015  . Pneumococcal Conjugate-13 04/02/2014  . Tdap 03/07/2014    Screening Tests Health Maintenance  Topic Date Due  . COLONOSCOPY  03/01/1994  . PNA vac Low Risk Adult (2 of 2 - PPSV23) 04/03/2015  . ZOSTAVAX  11/11/2017 (Originally 03/01/2004)  . TETANUS/TDAP  03/07/2024  . Hepatitis C Screening  Completed    All answers were reviewed with the patient and necessary referrals were made:  Vanetta Mulders, LPN   [id number]   History reviewed: allergies, current medications, past family history, past medical history, past social history, past surgical history and problem list  Review of Systems A comprehensive review of systems was negative.    Objective:     Vision by Snellen chart: right eye:20/30, left eye:20/20 There were no vitals taken for this visit. There is no height or weight on file to calculate BMI.  No exam performed today, annual wellness without physical exam.     Assessment:     Medicare annual wellness visit, subsequent Annual exam as documented. Counseling done  re healthy lifestyle involving commitment to 150 minutes exercise per week, heart healthy diet, and attaining healthy weight.The importance of adequate sleep also discussed. Regular seat belt use and home safety, is also discussed. Changes in health habits are decided on by the patient with goals and time frames  set for achieving them. Immunization and cancer screening needs are specifically addressed at this visit.        Plan:     During the course of the visit the patient was educated and counseled about appropriate screening and preventive services including:    Pneumococcal vaccine   Influenza vaccine  Diet review for nutrition  referral? Yes ____  Not Indicated ___x_   Patient Instructions (the written plan) was given to the patient.  Medicare Attestation I have personally reviewed: The patient's medical and social history Their use  of alcohol, tobacco or illicit drugs Their current medications and supplements The patient's functional ability including ADLs,fall risks, home safety risks, cognitive, and hearing and visual impairment Diet and physical activities Evidence for depression or mood disorders  The patient's weight, height, BMI, and visual acuity have been recorded in the chart.  I have made referrals, counseling, and provided education to the patient based on review of the above and I have provided the patient with a written personalized care plan for preventive services.     Denman George [place], [place]   [id number]

## 2016-01-19 ENCOUNTER — Other Ambulatory Visit (HOSPITAL_COMMUNITY): Payer: Self-pay | Admitting: Oncology

## 2016-01-19 DIAGNOSIS — C9110 Chronic lymphocytic leukemia of B-cell type not having achieved remission: Secondary | ICD-10-CM

## 2016-01-19 NOTE — Assessment & Plan Note (Signed)

## 2016-01-20 ENCOUNTER — Other Ambulatory Visit (HOSPITAL_COMMUNITY): Payer: Self-pay | Admitting: Oncology

## 2016-01-20 ENCOUNTER — Encounter (HOSPITAL_COMMUNITY): Payer: Self-pay

## 2016-01-20 ENCOUNTER — Encounter (HOSPITAL_COMMUNITY): Payer: Medicare Other | Attending: Oncology

## 2016-01-20 VITALS — BP 119/53 | HR 74 | Temp 97.4°F | Resp 18 | Wt 208.2 lb

## 2016-01-20 DIAGNOSIS — C9110 Chronic lymphocytic leukemia of B-cell type not having achieved remission: Secondary | ICD-10-CM | POA: Diagnosis not present

## 2016-01-20 DIAGNOSIS — Z95828 Presence of other vascular implants and grafts: Secondary | ICD-10-CM

## 2016-01-20 DIAGNOSIS — E876 Hypokalemia: Secondary | ICD-10-CM

## 2016-01-20 LAB — CBC WITH DIFFERENTIAL/PLATELET
Basophils Absolute: 0.1 10*3/uL (ref 0.0–0.1)
Basophils Relative: 1 %
Eosinophils Absolute: 0.6 10*3/uL (ref 0.0–0.7)
Eosinophils Relative: 11 %
HCT: 29.5 % — ABNORMAL LOW (ref 39.0–52.0)
HEMOGLOBIN: 9.7 g/dL — AB (ref 13.0–17.0)
LYMPHS ABS: 1.6 10*3/uL (ref 0.7–4.0)
Lymphocytes Relative: 30 %
MCH: 31.5 pg (ref 26.0–34.0)
MCHC: 32.9 g/dL (ref 30.0–36.0)
MCV: 95.8 fL (ref 78.0–100.0)
Monocytes Absolute: 0.4 10*3/uL (ref 0.1–1.0)
Monocytes Relative: 8 %
NEUTROS ABS: 2.6 10*3/uL (ref 1.7–7.7)
NEUTROS PCT: 50 %
Platelets: 70 10*3/uL — ABNORMAL LOW (ref 150–400)
RBC: 3.08 MIL/uL — AB (ref 4.22–5.81)
RDW: 17.1 % — ABNORMAL HIGH (ref 11.5–15.5)
WBC: 5.3 10*3/uL (ref 4.0–10.5)

## 2016-01-20 LAB — COMPREHENSIVE METABOLIC PANEL
ALT: 23 U/L (ref 17–63)
AST: 36 U/L (ref 15–41)
Albumin: 2.7 g/dL — ABNORMAL LOW (ref 3.5–5.0)
Alkaline Phosphatase: 133 U/L — ABNORMAL HIGH (ref 38–126)
Anion gap: 6 (ref 5–15)
BUN: 20 mg/dL (ref 6–20)
CHLORIDE: 107 mmol/L (ref 101–111)
CO2: 21 mmol/L — ABNORMAL LOW (ref 22–32)
Calcium: 8 mg/dL — ABNORMAL LOW (ref 8.9–10.3)
Creatinine, Ser: 1.58 mg/dL — ABNORMAL HIGH (ref 0.61–1.24)
GFR, EST AFRICAN AMERICAN: 49 mL/min — AB (ref >60)
GFR, EST NON AFRICAN AMERICAN: 42 mL/min — AB (ref >60)
Glucose, Bld: 131 mg/dL — ABNORMAL HIGH (ref 65–99)
POTASSIUM: 3.3 mmol/L — AB (ref 3.5–5.1)
Sodium: 134 mmol/L — ABNORMAL LOW (ref 135–145)
Total Bilirubin: 0.4 mg/dL (ref 0.3–1.2)
Total Protein: 5.6 g/dL — ABNORMAL LOW (ref 6.5–8.1)

## 2016-01-20 LAB — LACTATE DEHYDROGENASE: LDH: 257 U/L — AB (ref 98–192)

## 2016-01-20 LAB — URIC ACID: Uric Acid, Serum: 7.6 mg/dL (ref 4.4–7.6)

## 2016-01-20 LAB — MAGNESIUM: MAGNESIUM: 1.8 mg/dL (ref 1.7–2.4)

## 2016-01-20 MED ORDER — HEPARIN SOD (PORK) LOCK FLUSH 100 UNIT/ML IV SOLN
INTRAVENOUS | Status: AC
  Filled 2016-01-20: qty 5

## 2016-01-20 MED ORDER — POTASSIUM CHLORIDE CRYS ER 20 MEQ PO TBCR
60.0000 meq | EXTENDED_RELEASE_TABLET | Freq: Once | ORAL | Status: AC
  Administered 2016-01-20: 60 meq via ORAL
  Filled 2016-01-20: qty 3

## 2016-01-20 MED ORDER — HEPARIN SOD (PORK) LOCK FLUSH 100 UNIT/ML IV SOLN
500.0000 [IU] | Freq: Once | INTRAVENOUS | Status: AC
  Administered 2016-01-20: 500 [IU] via INTRAVENOUS

## 2016-01-20 MED ORDER — SODIUM CHLORIDE 0.9% FLUSH
10.0000 mL | Freq: Once | INTRAVENOUS | Status: AC
  Administered 2016-01-20: 10 mL via INTRAVENOUS

## 2016-01-20 NOTE — Progress Notes (Signed)
[name] presented for Portacath access and flush. Proper placement of portacath confirmed by CXR. Portacath located right chest wall accessed with  H 20 needle. Good blood return present. Portacath flushed with 54ml NS and 500U/97ml Heparin and needle removed intact. Procedure without incident. Patient tolerated procedure well.  Labs reviewed with T.Kefalas,PA. No treatment today, defer chemo x 1 week. Potassium p.o. Before patient leaves. Discussed potassium with patient, states he has decreased potassium to one tablet daily on his own. Encouraged patient to take potassium as prescribed 2 tablets daily. Verbalized understanding.  Stable and ambulatory on discharge home to self.

## 2016-01-20 NOTE — Patient Instructions (Signed)
[place] at [facility] Discharge Instructions  RECOMMENDATIONS MADE BY THE CONSULTANT AND ANY TEST RESULTS WILL BE SENT TO YOUR REFERRING PHYSICIAN.  No chemo today, will reschedule in 1 week. You are to take potassium as prescribed, 2 tablets daily. Report any issues/concerns to clinic as needed. Return as scheduled.  Thank you for choosing [place] at [facility] to provide your oncology and hematology care.  To afford each patient quality time with our provider, please arrive at least 15 minutes before your scheduled appointment time.   Beginning January 23rd 2017 lab work for the [facility] will be done in the  Main lab at [facility] on 1st floor. If you have a lab appointment with the [place] please come in thru the  Main Entrance and check in at the main information desk  You need to re-schedule your appointment should you arrive 10 or more minutes late.  We strive to give you quality time with our providers, and arriving late affects you and other patients whose appointments are after yours.  Also, if you no show three or more times for appointments you may be dismissed from the clinic at the providers discretion.     Again, thank you for choosing [facility].  Our hope is that these requests will decrease the amount of time that you wait before being seen by our physicians.       _____________________________________________________________  Should you have questions after your visit to [facility], please contact our office at [phone] between the hours of 8:30 a.m. and 4:30 p.m.  Voicemails left after 4:30 p.m. will not be returned until the following business day.  For prescription refill requests, have your pharmacy contact our office.         Resources For Cancer Patients and their Caregivers ? American Cancer Society: Can assist with transportation, wigs, general needs, runs Look  Good Feel Better.        (270)402-7910 ? Cancer Care: Provides financial assistance, online support groups, medication/co-pay assistance.  1-800-813-HOPE (519)367-9443) ? [place] Assists [place] cancer patients and their families through emotional , educational and financial support.  912 105 6067 ? [place] DSS Where to apply for food stamps, Medicaid and utility assistance. (603) 036-2359 ? RCATS: Transportation to medical appointments. (703)386-1192 ? Social Security Administration: May apply for disability if have a Stage IV cancer. ([phone] 745-6063 936-817-8015 ? [employer], Disability and Transit Services: Assists with nutrition, care and transit needs. [place] Support Programs: @10RELATIVEDAYS @ > Cancer Support Group  2nd Tuesday of the month 1pm-2pm, Journey Room  > Creative Journey  3rd Tuesday of the month 1130am-1pm, Journey Room  > Look Good Feel Better  1st Wednesday of the month 10am-12 noon, Journey Room (Call [place] to register [phone])

## 2016-01-21 ENCOUNTER — Ambulatory Visit (HOSPITAL_COMMUNITY): Payer: Medicare Other

## 2016-01-27 ENCOUNTER — Other Ambulatory Visit (HOSPITAL_COMMUNITY): Payer: Self-pay | Admitting: Oncology

## 2016-01-27 ENCOUNTER — Encounter (HOSPITAL_BASED_OUTPATIENT_CLINIC_OR_DEPARTMENT_OTHER): Payer: Medicare Other

## 2016-01-27 VITALS — BP 121/69 | HR 75 | Temp 97.5°F | Resp 18 | Wt 208.8 lb

## 2016-01-27 DIAGNOSIS — Z95828 Presence of other vascular implants and grafts: Secondary | ICD-10-CM

## 2016-01-27 DIAGNOSIS — C9110 Chronic lymphocytic leukemia of B-cell type not having achieved remission: Secondary | ICD-10-CM | POA: Diagnosis not present

## 2016-01-27 DIAGNOSIS — C8190 Hodgkin lymphoma, unspecified, unspecified site: Secondary | ICD-10-CM | POA: Diagnosis not present

## 2016-01-27 DIAGNOSIS — Z23 Encounter for immunization: Secondary | ICD-10-CM | POA: Diagnosis not present

## 2016-01-27 LAB — URIC ACID: Uric Acid, Serum: 5.7 mg/dL (ref 4.4–7.6)

## 2016-01-27 LAB — COMPREHENSIVE METABOLIC PANEL
ALT: 21 U/L (ref 17–63)
ANION GAP: 5 (ref 5–15)
AST: 33 U/L (ref 15–41)
Albumin: 2.9 g/dL — ABNORMAL LOW (ref 3.5–5.0)
Alkaline Phosphatase: 135 U/L — ABNORMAL HIGH (ref 38–126)
BUN: 16 mg/dL (ref 6–20)
CHLORIDE: 108 mmol/L (ref 101–111)
CO2: 22 mmol/L (ref 22–32)
Calcium: 8.2 mg/dL — ABNORMAL LOW (ref 8.9–10.3)
Creatinine, Ser: 1.37 mg/dL — ABNORMAL HIGH (ref 0.61–1.24)
GFR, EST AFRICAN AMERICAN: 58 mL/min — AB (ref >60)
GFR, EST NON AFRICAN AMERICAN: 50 mL/min — AB (ref >60)
Glucose, Bld: 121 mg/dL — ABNORMAL HIGH (ref 65–99)
POTASSIUM: 4.3 mmol/L (ref 3.5–5.1)
Sodium: 135 mmol/L (ref 135–145)
TOTAL PROTEIN: 5.5 g/dL — AB (ref 6.5–8.1)
Total Bilirubin: 0.6 mg/dL (ref 0.3–1.2)

## 2016-01-27 LAB — CBC WITH DIFFERENTIAL/PLATELET
BASOS ABS: 0.1 10*3/uL (ref 0.0–0.1)
Basophils Relative: 1 %
EOS PCT: 10 %
Eosinophils Absolute: 0.6 10*3/uL (ref 0.0–0.7)
HEMATOCRIT: 31.3 % — AB (ref 39.0–52.0)
Hemoglobin: 10.2 g/dL — ABNORMAL LOW (ref 13.0–17.0)
LYMPHS ABS: 2 10*3/uL (ref 0.7–4.0)
LYMPHS PCT: 35 %
MCH: 32.1 pg (ref 26.0–34.0)
MCHC: 32.6 g/dL (ref 30.0–36.0)
MCV: 98.4 fL (ref 78.0–100.0)
MONO ABS: 0.6 10*3/uL (ref 0.1–1.0)
MONOS PCT: 11 %
NEUTROS ABS: 2.4 10*3/uL (ref 1.7–7.7)
Neutrophils Relative %: 43 %
PLATELETS: 88 10*3/uL — AB (ref 150–400)
RBC: 3.18 MIL/uL — ABNORMAL LOW (ref 4.22–5.81)
RDW: 17.6 % — AB (ref 11.5–15.5)
WBC: 5.6 10*3/uL (ref 4.0–10.5)

## 2016-01-27 LAB — MAGNESIUM: MAGNESIUM: 1.8 mg/dL (ref 1.7–2.4)

## 2016-01-27 LAB — LACTATE DEHYDROGENASE: LDH: 239 U/L — AB (ref 98–192)

## 2016-01-27 MED ORDER — SULFAMETHOXAZOLE-TRIMETHOPRIM 800-160 MG PO TABS: ORAL_TABLET | ORAL | 1 refills | Status: DC

## 2016-01-27 MED ORDER — HEPARIN SOD (PORK) LOCK FLUSH 100 UNIT/ML IV SOLN
INTRAVENOUS | Status: AC
  Filled 2016-01-27: qty 5

## 2016-01-27 MED ORDER — HEPARIN SOD (PORK) LOCK FLUSH 100 UNIT/ML IV SOLN
500.0000 [IU] | Freq: Once | INTRAVENOUS | Status: AC
  Administered 2016-01-27: 500 [IU] via INTRAVENOUS

## 2016-01-27 MED ORDER — SODIUM CHLORIDE 0.9% FLUSH
10.0000 mL | Freq: Once | INTRAVENOUS | Status: AC
  Administered 2016-01-27: 10 mL via INTRAVENOUS

## 2016-01-27 NOTE — Progress Notes (Signed)
Defer chemo x 1 week d/t low platelets. [name] presented for Portacath access and flush. Proper placement of portacath confirmed by CXR. Portacath located right chest wall accessed with  H 20 needle. Good blood return present. Portacath flushed with 70ml NS and 500U/16ml Heparin and needle removed intact. Procedure without incident. Patient tolerated procedure well.

## 2016-01-28 ENCOUNTER — Ambulatory Visit (HOSPITAL_COMMUNITY): Payer: Medicare Other

## 2016-02-03 ENCOUNTER — Encounter (HOSPITAL_COMMUNITY): Payer: Medicare Other | Attending: Hematology & Oncology

## 2016-02-03 VITALS — BP 117/58 | HR 89 | Temp 97.5°F | Resp 18 | Wt 208.0 lb

## 2016-02-03 DIAGNOSIS — C9110 Chronic lymphocytic leukemia of B-cell type not having achieved remission: Secondary | ICD-10-CM | POA: Insufficient documentation

## 2016-02-03 DIAGNOSIS — Z95828 Presence of other vascular implants and grafts: Secondary | ICD-10-CM

## 2016-02-03 LAB — CBC WITH DIFFERENTIAL/PLATELET
BASOS PCT: 1 %
Basophils Absolute: 0 10*3/uL (ref 0.0–0.1)
EOS ABS: 0.6 10*3/uL (ref 0.0–0.7)
EOS PCT: 10 %
HCT: 31 % — ABNORMAL LOW (ref 39.0–52.0)
HEMOGLOBIN: 10.2 g/dL — AB (ref 13.0–17.0)
Lymphocytes Relative: 36 %
Lymphs Abs: 2.2 10*3/uL (ref 0.7–4.0)
MCH: 32.6 pg (ref 26.0–34.0)
MCHC: 32.9 g/dL (ref 30.0–36.0)
MCV: 99 fL (ref 78.0–100.0)
Monocytes Absolute: 0.6 10*3/uL (ref 0.1–1.0)
Monocytes Relative: 10 %
NEUTROS PCT: 43 %
Neutro Abs: 2.7 10*3/uL (ref 1.7–7.7)
PLATELETS: 93 10*3/uL — AB (ref 150–400)
RBC: 3.13 MIL/uL — AB (ref 4.22–5.81)
RDW: 17.9 % — ABNORMAL HIGH (ref 11.5–15.5)
WBC: 6.1 10*3/uL (ref 4.0–10.5)

## 2016-02-03 LAB — COMPREHENSIVE METABOLIC PANEL
ALBUMIN: 3 g/dL — AB (ref 3.5–5.0)
ALT: 22 U/L (ref 17–63)
ANION GAP: 4 — AB (ref 5–15)
AST: 33 U/L (ref 15–41)
Alkaline Phosphatase: 124 U/L (ref 38–126)
BUN: 12 mg/dL (ref 6–20)
CALCIUM: 8.2 mg/dL — AB (ref 8.9–10.3)
CHLORIDE: 107 mmol/L (ref 101–111)
CO2: 26 mmol/L (ref 22–32)
Creatinine, Ser: 1.4 mg/dL — ABNORMAL HIGH (ref 0.61–1.24)
GFR calc non Af Amer: 49 mL/min — ABNORMAL LOW (ref >60)
GFR, EST AFRICAN AMERICAN: 57 mL/min — AB (ref >60)
GLUCOSE: 113 mg/dL — AB (ref 65–99)
Potassium: 4.1 mmol/L (ref 3.5–5.1)
SODIUM: 137 mmol/L (ref 135–145)
Total Bilirubin: 0.3 mg/dL (ref 0.3–1.2)
Total Protein: 5.6 g/dL — ABNORMAL LOW (ref 6.5–8.1)

## 2016-02-03 LAB — MAGNESIUM: MAGNESIUM: 1.9 mg/dL (ref 1.7–2.4)

## 2016-02-03 LAB — URIC ACID: URIC ACID, SERUM: 6.3 mg/dL (ref 4.4–7.6)

## 2016-02-03 LAB — LACTATE DEHYDROGENASE: LDH: 215 U/L — ABNORMAL HIGH (ref 98–192)

## 2016-02-03 MED ORDER — HEPARIN SOD (PORK) LOCK FLUSH 100 UNIT/ML IV SOLN
INTRAVENOUS | Status: AC
  Filled 2016-02-03: qty 5

## 2016-02-03 MED ORDER — HEPARIN SOD (PORK) LOCK FLUSH 100 UNIT/ML IV SOLN
500.0000 [IU] | Freq: Once | INTRAVENOUS | Status: AC
  Administered 2016-02-03: 500 [IU] via INTRAVENOUS

## 2016-02-03 MED ORDER — SODIUM CHLORIDE 0.9% FLUSH
10.0000 mL | Freq: Once | INTRAVENOUS | Status: AC
  Administered 2016-02-03: 10 mL via INTRAVENOUS

## 2016-02-03 NOTE — Patient Instructions (Signed)
[place] at [facility] Discharge Instructions  RECOMMENDATIONS MADE BY THE CONSULTANT AND ANY TEST RESULTS WILL BE SENT TO YOUR REFERRING PHYSICIAN.  Chemotherapy to be held going forward. Continue IVIG monthly. Return as scheduled.  Thank you for choosing [place] at [facility] to provide your oncology and hematology care.  To afford each patient quality time with our provider, please arrive at least 15 minutes before your scheduled appointment time.   Beginning January 23rd 2017 lab work for the [facility] will be done in the  Main lab at [facility] on 1st floor. If you have a lab appointment with the [place] please come in thru the  Main Entrance and check in at the main information desk  You need to re-schedule your appointment should you arrive 10 or more minutes late.  We strive to give you quality time with our providers, and arriving late affects you and other patients whose appointments are after yours.  Also, if you no show three or more times for appointments you may be dismissed from the clinic at the providers discretion.     Again, thank you for choosing [facility].  Our hope is that these requests will decrease the amount of time that you wait before being seen by our physicians.       _____________________________________________________________  Should you have questions after your visit to [facility], please contact our office at [phone] between the hours of 8:30 a.m. and 4:30 p.m.  Voicemails left after 4:30 p.m. will not be returned until the following business day.  For prescription refill requests, have your pharmacy contact our office.         Resources For Cancer Patients and their Caregivers ? American Cancer Society: Can assist with transportation, wigs, general needs, runs Look Good Feel Better.        619-075-3292 ? Cancer Care: Provides financial assistance,  online support groups, medication/co-pay assistance.  1-800-813-HOPE (469)012-0558) ? [place] Assists [place] cancer patients and their families through emotional , educational and financial support.  831-486-5426 ? [place] DSS Where to apply for food stamps, Medicaid and utility assistance. (614)179-5869 ? RCATS: Transportation to medical appointments. (319)846-4304 ? Social Security Administration: May apply for disability if have a Stage IV cancer. 319-751-5179 8183747502 ? [employer], Disability and Transit Services: Assists with nutrition, care and transit needs. [place] Support Programs: @10RELATIVEDAYS @ > Cancer Support Group  2nd Tuesday of the month 1pm-2pm, Journey Room  > Creative Journey  3rd Tuesday of the month 1130am-1pm, Journey Room  > Look Good Feel Better  1st Wednesday of the month 10am-12 noon, Journey Room (Call [place] to register [phone])

## 2016-02-03 NOTE — Progress Notes (Signed)
Labs reviewed with Dr.Penland. She will discontinue chemotherapy for now. Will continue IVIG as ordered. Informed patient about MD decision. Discussion with patient that the chemotherapy will be held for now and going forward unless there is a significant change in blood counts. Informed he will continue to be watch and receive IVIG monthly. Patient verbalized understanding.  [name] presented for Portacath access and flush. Proper placement of portacath confirmed by CXR. Portacath located right chest wall accessed with  H 20 needle. Good blood return present. Portacath flushed with 14ml NS and 500U/72ml Heparin and needle removed intact. Procedure without incident. Patient tolerated procedure well.  Patient stable and ambulatory on discharge home to self.

## 2016-02-04 ENCOUNTER — Ambulatory Visit (HOSPITAL_COMMUNITY): Payer: Medicare Other

## 2016-02-11 ENCOUNTER — Encounter (HOSPITAL_COMMUNITY): Payer: Medicare Other | Attending: Hematology & Oncology

## 2016-02-11 VITALS — BP 114/49 | HR 65 | Temp 97.4°F | Resp 18 | Wt 209.0 lb

## 2016-02-11 DIAGNOSIS — D801 Nonfamilial hypogammaglobulinemia: Secondary | ICD-10-CM | POA: Diagnosis not present

## 2016-02-11 DIAGNOSIS — Z95828 Presence of other vascular implants and grafts: Secondary | ICD-10-CM

## 2016-02-11 MED ORDER — HEPARIN SOD (PORK) LOCK FLUSH 100 UNIT/ML IV SOLN
INTRAVENOUS | Status: AC
  Filled 2016-02-11: qty 5

## 2016-02-11 MED ORDER — DIPHENHYDRAMINE HCL 25 MG PO CAPS
25.0000 mg | ORAL_CAPSULE | Freq: Once | ORAL | Status: AC
  Administered 2016-02-11: 25 mg via ORAL

## 2016-02-11 MED ORDER — DIPHENHYDRAMINE HCL 25 MG PO TABS
25.0000 mg | ORAL_TABLET | Freq: Once | ORAL | Status: DC
  Filled 2016-02-11: qty 1

## 2016-02-11 MED ORDER — DIPHENHYDRAMINE HCL 25 MG PO CAPS
ORAL_CAPSULE | ORAL | Status: AC
  Filled 2016-02-11: qty 1

## 2016-02-11 MED ORDER — ACETAMINOPHEN 325 MG PO TABS
650.0000 mg | ORAL_TABLET | Freq: Four times a day (QID) | ORAL | Status: DC | PRN
  Administered 2016-02-11: 650 mg via ORAL

## 2016-02-11 MED ORDER — SODIUM CHLORIDE 0.9 % IV SOLN: Freq: Once | INTRAVENOUS | Status: DC

## 2016-02-11 MED ORDER — IMMUNE GLOBULIN (HUMAN) 20 GM/200ML IV SOLN
0.4000 g/kg | Freq: Once | INTRAVENOUS | Status: AC
  Administered 2016-02-11: 40 g via INTRAVENOUS
  Filled 2016-02-11: qty 200

## 2016-02-11 MED ORDER — HEPARIN SOD (PORK) LOCK FLUSH 100 UNIT/ML IV SOLN
500.0000 [IU] | Freq: Once | INTRAVENOUS | Status: AC
  Administered 2016-02-11: 500 [IU] via INTRAVENOUS

## 2016-02-11 MED ORDER — ACETAMINOPHEN 325 MG PO TABS
ORAL_TABLET | ORAL | Status: AC
  Filled 2016-02-11: qty 2

## 2016-02-11 NOTE — Progress Notes (Signed)
Tolerated IVIG infusion well. Stable and ambulatory on discharge home to self. 

## 2016-02-11 NOTE — Patient Instructions (Signed)
[name] at [facility] Discharge Instructions  RECOMMENDATIONS MADE BY THE CONSULTANT AND ANY TEST RESULTS WILL BE SENT TO YOUR REFERRING PHYSICIAN.  IVIG infusion given as ordered. Return as scheduled.  Thank you for choosing [place] at [facility] to provide your oncology and hematology care.  To afford each patient quality time with our provider, please arrive at least 15 minutes before your scheduled appointment time.   Beginning January 23rd 2017 lab work for the [facility] will be done in the  Main lab at [facility] on 1st floor. If you have a lab appointment with the [place] please come in thru the  Main Entrance and check in at the main information desk  You need to re-schedule your appointment should you arrive 10 or more minutes late.  We strive to give you quality time with our providers, and arriving late affects you and other patients whose appointments are after yours.  Also, if you no show three or more times for appointments you may be dismissed from the clinic at the providers discretion.     Again, thank you for choosing [facility].  Our hope is that these requests will decrease the amount of time that you wait before being seen by our physicians.       _____________________________________________________________  Should you have questions after your visit to [facility], please contact our office at [phone] between the hours of 8:30 a.m. and 4:30 p.m.  Voicemails left after 4:30 p.m. will not be returned until the following business day.  For prescription refill requests, have your pharmacy contact our office.         Resources For Cancer Patients and their Caregivers ? American Cancer Society: Can assist with transportation, wigs, general needs, runs Look Good Feel Better.        941-143-2890 ? Cancer Care: Provides financial assistance, online support groups,  medication/co-pay assistance.  1-800-813-HOPE (807)246-8145) ? [place] Assists [place] cancer patients and their families through emotional , educational and financial support.  (256) 486-0902 ? [place] DSS Where to apply for food stamps, Medicaid and utility assistance. 772-640-1965 ? RCATS: Transportation to medical appointments. 785-264-0927 ? Social Security Administration: May apply for disability if have a Stage IV cancer. 747 183 3583 (586) 694-5280 ? [employer], Disability and Transit Services: Assists with nutrition, care and transit needs. [place] Support Programs: @10RELATIVEDAYS @ > Cancer Support Group  2nd Tuesday of the month 1pm-2pm, Journey Room  > Creative Journey  3rd Tuesday of the month 1130am-1pm, Journey Room  > Look Good Feel Better  1st Wednesday of the month 10am-12 noon, Journey Room (Call [place] to register [phone])

## 2016-02-17 ENCOUNTER — Ambulatory Visit (HOSPITAL_COMMUNITY): Payer: Medicare Other | Admitting: Oncology

## 2016-02-17 ENCOUNTER — Ambulatory Visit (HOSPITAL_COMMUNITY): Payer: Medicare Other

## 2016-02-17 ENCOUNTER — Other Ambulatory Visit (HOSPITAL_COMMUNITY): Payer: Medicare Other

## 2016-02-18 ENCOUNTER — Ambulatory Visit (HOSPITAL_COMMUNITY): Payer: Medicare Other

## 2016-02-23 NOTE — Progress Notes (Signed)
Cardiology Office Note  Date: 02/25/2016   ID: [name], [name] [birth year], MRN [id number]  PCP: [name] Nakayama, MD  Primary Cardiologist: [name] Lesches, MD   Chief Complaint  Patient presents with  . Aortic valve disease  . Coronary Artery Disease    History of Present Illness: [name] is a 70 y.o. male last seen in June. He presents for a routine follow-up visit. From a cardiac perspective he does not report any exertional chest pain or palpitations. We reviewed his medications, he continues on aspirin, Lopressor, and Zocor.  He continues to follow in the oncology clinic for management of CLL and hypogammaglobulinemia. He has undergone chemotherapy and also treatment with IVIG.  Last echocardiogram from 2015 is outlined below.  Past Medical History:  Diagnosis Date  . Acid reflux   . Aortic stenosis   . Arthritis   . Atrial fibrillation ([place])   . BPH (benign prostatic hypertrophy)   . CLL (chronic lymphoblastic leukemia)   . Coronary atherosclerosis of native coronary artery   . Essential hypertension, benign   . Hodgkin's disease   . HOH (hard of hearing)   . Hyperlipidemia   . Hypogammaglobulinemia ([place])   . Indirect inguinal hernia 04/13/2013  . Kidney stones   . Pre-diabetes 08/04/2011  . Wears dentures   . Wears glasses     Past Surgical History:  Procedure Laterality Date  . AORTIC VALVE REPLACEMENT  10/09   31mm Magna Pericardial   . BONE MARROW ASPIRATION    . CATARACT EXTRACTION W/PHACO Right 01/07/2014   Procedure: CATARACT EXTRACTION PHACO AND INTRAOCULAR LENS PLACEMENT ([place]);  Surgeon: Tonny Branch, MD;  Location: [facility];  Service: Ophthalmology;  Laterality: Right;  CDE:15.31  . CATARACT EXTRACTION W/PHACO Left 03/25/2014   Procedure: CATARACT EXTRACTION PHACO AND INTRAOCULAR LENS PLACEMENT (I[facility]);  Surgeon: Tonny Branch, MD;  Location: [facility];  Service: Ophthalmology;  Laterality: Left;  CDE 6.55  . COLON SURGERY    . CORONARY ARTERY  BYPASS GRAFT  10/09   SVG to RCA  . DENTAL SURGERY  08/2012  . ESOPHAGOGASTRODUODENOSCOPY     scrapping of throat and stretching  . EYE SURGERY Right 02/07/2014   cataract  . LITHOTRIPSY    . MASS EXCISION N/A 12/05/2012   Procedure: EXCISION MIDLINE NECK MASS;  Surgeon: Ascencion Dike, MD;  Location: [place];  Service: ENT;  Laterality: N/A;  . MULTIPLE TOOTH EXTRACTIONS    . PORTACATH PLACEMENT    . TONGUE BIOPSY  12/2012    Current Outpatient Prescriptions  Medication Sig Dispense Refill  . acetaminophen (TYLENOL) 500 MG tablet Take 1,000 mg by mouth every 6 (six) hours as needed for fever.     Marland Kitchen acyclovir (ZOVIRAX) 200 MG capsule TAKE ONE CAPSULE BY MOUTH TWICE DAILY 60 capsule 5  . aspirin EC 81 MG tablet Take 81 mg by mouth daily.    . BENDAMUSTINE HCL IV Inject into the vein. To start Tuesday 09/09/15    . folic acid (FOLVITE) 1 MG tablet TAKE ONE TABLET BY MOUTH ONCE DAILY 30 tablet 5  . metoprolol (LOPRESSOR) 50 MG tablet TAKE ONE TABLET BY MOUTH TWICE DAILY 60 tablet 6  . Multiple Vitamins-Minerals (CENTRUM SILVER ULTRA MENS PO) Take 1 tablet by mouth every morning.     Marland Kitchen OMEGA 3 1000 MG CAPS Take 1,000 mg by mouth daily.     Marland Kitchen omeprazole (PRILOSEC) 20 MG capsule Take 20 mg by mouth every morning.     Marland Kitchen  ondansetron (ZOFRAN) 8 MG tablet Take 1 tablet (8 mg total) by mouth every 8 (eight) hours as needed for nausea or vomiting. 30 tablet 2  . RiTUXimab (RITUXAN IV) Inject into the vein. To start Monday 09/08/15    . simvastatin (ZOCOR) 40 MG tablet Take 1 tablet (40 mg total) by mouth at bedtime. 90 tablet 3  . sulfamethoxazole-trimethoprim (BACTRIM DS,SEPTRA DS) 800-160 MG tablet TAKE ONE TABLET BY MOUTH THREE TIMES A WEEK 36 tablet 1   No current facility-administered medications for this visit.    Facility-Administered Medications Ordered in Other Visits  Medication Dose Route Frequency Provider Last Rate Last Dose  . HYDROcodone-homatropine (HYCODAN) 5-1.5  MG/5ML syrup 5 mL  5 mL Oral Once [employer], PA-C      . potassium chloride SA (K-DUR,KLOR-CON) CR tablet 40 mEq  40 mEq Oral Once [facility] [employer], PA-C       Allergies:  Azithromycin   Social History: The patient  reports that he has never smoked. He has never used smokeless tobacco. He reports that he does not drink alcohol or use drugs.   ROS:  Please see the history of present illness. Otherwise, complete review of systems is positive for fatigue.  All other systems are reviewed and negative.   Physical Exam: VS:  BP (!) 108/58   Pulse 79   Ht 5\' 10"  (1.778 m)   Wt 204 lb (92.5 kg)   SpO2 98%   BMI 29.27 kg/m , BMI Body mass index is 29.27 kg/m.  Wt Readings from Last 3 Encounters:  02/25/16 204 lb (92.5 kg)  02/11/16 209 lb (94.8 kg)  02/03/16 208 lb (94.3 kg)    Chronically ill-appearing, no distress.  HEENT: Conjunctiva and lids normal, oropharynx with poor dentition.  Neck: Supple, no elevated jugular venous pressure or bruits.  Lungs: Clear to auscultation, nonlabored.  Cardiac: Regular rate and rhythm, S4, 2/6 systolic murmur at the base, no S3.  Abdomen: Obese, nontender, bowel sounds present.  Extremities: Venous stasis noted, 1-2+ edema, distal pulses diminished.  ECG: I personally reviewed the tracing from 08/15/2015 which showed sinus rhythm with IVCD of left bundle block type.  Recent Labwork: 06/10/2015: TSH 2.38 02/03/2016: ALT 22; AST 33; BUN 12; Creatinine, Ser 1.40; Hemoglobin 10.2; Magnesium 1.9; Platelets 93; Potassium 4.1; Sodium 137     Component Value Date/Time   CHOL 200 06/10/2015 1601   TRIG 190 (H) 06/10/2015 1601   HDL 16 (L) 06/10/2015 1601   CHOLHDL 12.5 (H) 06/10/2015 1601   VLDL 38 (H) 06/10/2015 1601   LDLCALC 146 (H) 06/10/2015 1601    Other Studies Reviewed Today:  Echocardiogram 01/24/2014: Study Conclusions  - Left ventricle: The cavity size was normal. Wall thickness was increased increased in a pattern of mild  to moderate LVH. Systolic function was normal. The estimated ejection fraction was in the range of 60% to 65%. Diastolic function is abnormal, indeterminate grade. - Aortic valve: A 23 mm Edwards Magna pericardial valve is in the AV position. Moderately calcified annulus. Mildly thickened leaflets. There was mild regurgitation. Mean gradient (S): 9 mm Hg. VTI ratio of LVOT to aortic valve: 0.48. Valve area (VTI): 2 cm^2. Valve area (Vmax): 1.85 cm^2. Valve area (Vmean): 2.04 cm^2. - Mitral valve: Moderately to severely calcified annulus. Moderately thickened leaflets . The findings are consistent with severe stenosis by mean pressure gradient. There was mild regurgitation. Mean gradient (D): 12 mm Hg. Cannot distinguish PHT from available spectral Doppler tracings, unable  to calculate MVA by PHT. The presence of AI prohibits accurate calculation of MVA by continuity equation. Morphologically there appears to be severe mitral stenosis. - Left atrium: The atrium was severely dilated. - Right ventricle: The cavity size was mildly dilated. - Right atrium: The atrium was moderately dilated. - Pulmonary arteries: Systolic pressure was moderately increased. PA peak pressure: 43 mm Hg (S). - Technically difficult study.  Assessment and Plan:  1. Aortic stenosis status post bioprosthetic AVR 2009 as outlined above. No significant change on examination. Echocardiogram from 2015 is reviewed above. We will continue with observation.  2. CAD status post SVG to RCA at the time of valve surgery in 2009. He does not report any angina symptoms and continues on aspirin and statin.  3. History of hypertension, blood pressure low normal. He is asymptomatic.  Current medicines were reviewed with the patient today.  Disposition: Follow-up with me in 6 months.  Signed, Satira Sark, MD, [facility] 02/25/2016 1:51 PM    [place] at [facility] [address], [place], [place] [address] Phone: [phone]; Fax: [phone]

## 2016-02-24 ENCOUNTER — Ambulatory Visit (HOSPITAL_COMMUNITY): Payer: Medicare Other | Admitting: Oncology

## 2016-02-24 ENCOUNTER — Other Ambulatory Visit (HOSPITAL_COMMUNITY): Payer: Medicare Other

## 2016-02-24 ENCOUNTER — Ambulatory Visit (HOSPITAL_COMMUNITY): Payer: Medicare Other

## 2016-02-25 ENCOUNTER — Encounter: Payer: Self-pay | Admitting: Cardiology

## 2016-02-25 ENCOUNTER — Ambulatory Visit (HOSPITAL_COMMUNITY): Payer: Medicare Other

## 2016-02-25 ENCOUNTER — Ambulatory Visit (INDEPENDENT_AMBULATORY_CARE_PROVIDER_SITE_OTHER): Payer: Medicare Other | Admitting: Cardiology

## 2016-02-25 VITALS — BP 108/58 | HR 79 | Ht 70.0 in | Wt 204.0 lb

## 2016-02-25 DIAGNOSIS — I2510 Atherosclerotic heart disease of native coronary artery without angina pectoris: Secondary | ICD-10-CM

## 2016-02-25 DIAGNOSIS — I10 Essential (primary) hypertension: Secondary | ICD-10-CM | POA: Diagnosis not present

## 2016-02-25 DIAGNOSIS — Z953 Presence of xenogenic heart valve: Secondary | ICD-10-CM

## 2016-02-25 MED ORDER — SIMVASTATIN 40 MG PO TABS: 40.0000 mg | ORAL_TABLET | Freq: Every day | ORAL | 3 refills | Status: AC

## 2016-02-25 NOTE — Patient Instructions (Signed)

## 2016-03-02 ENCOUNTER — Ambulatory Visit (HOSPITAL_COMMUNITY): Payer: Medicare Other

## 2016-03-02 ENCOUNTER — Encounter (HOSPITAL_COMMUNITY): Payer: Medicare Other | Attending: Oncology | Admitting: Oncology

## 2016-03-02 DIAGNOSIS — D696 Thrombocytopenia, unspecified: Secondary | ICD-10-CM | POA: Diagnosis not present

## 2016-03-02 DIAGNOSIS — N183 Chronic kidney disease, stage 3 unspecified: Secondary | ICD-10-CM

## 2016-03-02 DIAGNOSIS — C8190 Hodgkin lymphoma, unspecified, unspecified site: Secondary | ICD-10-CM

## 2016-03-02 DIAGNOSIS — Z23 Encounter for immunization: Secondary | ICD-10-CM | POA: Insufficient documentation

## 2016-03-02 DIAGNOSIS — D801 Nonfamilial hypogammaglobulinemia: Secondary | ICD-10-CM | POA: Diagnosis not present

## 2016-03-02 DIAGNOSIS — Z8571 Personal history of Hodgkin lymphoma: Secondary | ICD-10-CM

## 2016-03-02 DIAGNOSIS — C9110 Chronic lymphocytic leukemia of B-cell type not having achieved remission: Secondary | ICD-10-CM | POA: Diagnosis not present

## 2016-03-02 DIAGNOSIS — D631 Anemia in chronic kidney disease: Secondary | ICD-10-CM

## 2016-03-02 NOTE — Progress Notes (Signed)
[name], MD [address], [address] [place] [address]  Leukemia, lymphocytic, chronic [facility]) - Plan: CBC with Differential, Comprehensive metabolic panel, Lactate dehydrogenase, IgG, IgA, IgM  Hypogammaglobulinemia, acquired ([place])  Hodgkin lymphoma, unspecified Hodgkin lymphoma type, unspecified body region ([place])  Anemia in stage 3 chronic kidney disease  CURRENT THERAPY: Surveillance per NCCN guidelines  INTERVAL HISTORY: [name] 70 y.o. 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 Now on systemic chemotherapy. AND History of Hodgkin's Lymphoma, treated in 2006. AND Hypogammaglobulenemia, started IVIG on 10/03/2015 due to recurrent URI. AND CKD, Stage III      Hodgkin's disease ([place])   10/02/2007 Initial Diagnosis    HODGKIN'S DISEASE       Leukemia, lymphocytic, chronic ([place])   07/22/2015 Procedure    IR bone marrow aspiration and biopsy      07/23/2015 Pathology Results    Bone Marrow, Aspirate,Biopsy, and Clot, left iliac crest - EXTENSIVE INVOLVEMENT BY CHRONIC LYMPHOCYTIC LEUKEMIA. PERIPHERAL BLOOD: - CHRONIC LYMPHOCYTIC LEUKEMIA. - NORMOCYTIC ANEMIA. - THROMBOCYTOPENIA.      07/23/2015 Pathology Results    Bone Marrow Flow Cytometry - CHRONIC LYMPHOCYTIC LEUKEMIA      08/20/2015 Imaging    CT CAP- Mediastinal and hilar lymphadenopathy consistent with CLL. No pulmonary lesions. Marked splenomegaly. Mild upper abdominal retroperitoneal adenopathy.      08/27/2015 Miscellaneous    Imbruvica therapy is cost-prohibitive.      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      11/04/2015 Treatment Plan Change    Treatment deferred due to  thrombocytopenia      02/03/2016 Treatment Plan Change    BR chemotherapy discontinued after 3 cycles of chemotherapy due to multiple delayed treatments secondary to chemotherapy-induced cytopenias.        He denies any B symptoms.  He notes that his energy level is improved.  Appetite is good.  Weight is stable.  He denies any enlarged lymph nodes.  He shared pictures with me of his grandson.  He also shares some feline pictures.  We share a black cat with the same name, "Bullet."  Review of Systems  Constitutional: Negative.  Negative for chills, fever and weight loss.  HENT: Negative.   Eyes: Negative.  Negative for double vision.  Respiratory: Negative.  Negative for cough and shortness of breath.   Cardiovascular: Negative.  Negative for chest pain.  Gastrointestinal: Negative.  Negative for constipation, diarrhea, nausea and vomiting.  Genitourinary: Negative.   Musculoskeletal: Negative.  Negative for falls.  Skin: Negative.   Neurological: Negative.  Negative for weakness.  Endo/Heme/Allergies: Negative.   Psychiatric/Behavioral: Negative.     Past Medical History:  Diagnosis Date  . Acid reflux   . Aortic stenosis   . Arthritis   . Atrial fibrillation ([place])   . BPH (benign prostatic hypertrophy)   . CLL (chronic lymphoblastic leukemia)   . Coronary atherosclerosis of native coronary artery   . Essential hypertension, benign   . Hodgkin's disease   . HOH (hard of hearing)   . Hyperlipidemia   . Hypogammaglobulinemia ([place])   . Indirect inguinal hernia 04/13/2013  . Kidney stones   .  Pre-diabetes 08/04/2011  . Wears dentures   . Wears glasses     Past Surgical History:  Procedure Laterality Date  . AORTIC VALVE REPLACEMENT  10/09   65mm Magna Pericardial   . BONE MARROW ASPIRATION    . CATARACT EXTRACTION W/PHACO Right 01/07/2014   Procedure: CATARACT EXTRACTION PHACO AND INTRAOCULAR LENS PLACEMENT ([place]);  Surgeon: Tonny Branch, MD;  Location: [facility];   Service: Ophthalmology;  Laterality: Right;  CDE:15.31  . CATARACT EXTRACTION W/PHACO Left 03/25/2014   Procedure: CATARACT EXTRACTION PHACO AND INTRAOCULAR LENS PLACEMENT ([facility]);  Surgeon: Tonny Branch, MD;  Location: [facility];  Service: Ophthalmology;  Laterality: Left;  CDE 6.55  . COLON SURGERY    . CORONARY ARTERY BYPASS GRAFT  10/09   SVG to RCA  . DENTAL SURGERY  08/2012  . ESOPHAGOGASTRODUODENOSCOPY     scrapping of throat and stretching  . EYE SURGERY Right 02/07/2014   cataract  . LITHOTRIPSY    . MASS EXCISION N/A 12/05/2012   Procedure: EXCISION MIDLINE NECK MASS;  Surgeon: Ascencion Dike, MD;  Location: [place];  Service: ENT;  Laterality: N/A;  . MULTIPLE TOOTH EXTRACTIONS    . PORTACATH PLACEMENT    . TONGUE BIOPSY  12/2012    Family History  Problem Relation Age of Onset  . Heart failure Mother   . Blindness Mother   . Cancer Father 26    Lung   . Cancer Brother 45    acute leukemia     Social History   Social History  . Marital status: Widowed    Spouse name: N/A  . Number of children: N/A  . Years of education: N/A   Social History Main Topics  . Smoking status: Never Smoker  . Smokeless tobacco: Never Used     Comment: pt denies tobacco use   . Alcohol use No     Comment: pt denies alcohol   . Drug use: No  . Sexual activity: Not Currently   Other Topics Concern  . Not on file   Social History Narrative  . No narrative on file     PHYSICAL EXAMINATION  ECOG PERFORMANCE STATUS: 1 - Symptomatic but completely ambulatory  Vitals:   03/02/16 0921  BP: (!) 116/58  Pulse: 72  Resp: 18  Temp: 97.5 F (36.4 C)    GENERAL:alert, no distress, well nourished, well developed, comfortable, cooperative, smiling and unaccompanied, blunted affect, smelling of feline urine. SKIN: skin color, texture, turgor are normal, no rashes or significant lesions HEAD: Normocephalic, No masses, lesions, tenderness or abnormalities EYES: normal, EOMI,  Conjunctiva are pink and non-injected EARS: External ears normal OROPHARYNX:lips, buccal mucosa, and tongue normal and mucous membranes are moist  NECK: supple, no adenopathy, trachea midline LYMPH:  no palpable lymphadenopathy BREAST:not examined LUNGS: clear to auscultation and percussion HEART: regular rate & rhythm, S4 and 2/6 systolic ejection murmur. ABDOMEN:abdomen soft and normal bowel sounds BACK: Back symmetric, no curvature. EXTREMITIES:less then 2 second capillary refill, no joint deformities, effusion, or inflammation, no skin discoloration, no cyanosis  NEURO: alert & oriented x 3 with fluent speech, no focal motor/sensory deficits, gait normal   LABORATORY DATA: CBC    Component Value Date/Time   WBC 6.1 02/03/2016 0852   RBC 3.13 (L) 02/03/2016 0852   HGB 10.2 (L) 02/03/2016 0852   HCT 31.0 (L) 02/03/2016 0852   PLT 93 (L) 02/03/2016 0852   MCV 99.0 02/03/2016 0852   MCH 32.6 02/03/2016 [id number]  MCHC 32.9 02/03/2016 0852   RDW 17.9 (H) 02/03/2016 0852   LYMPHSABS 2.2 02/03/2016 0852   MONOABS 0.6 02/03/2016 0852   EOSABS 0.6 02/03/2016 0852   BASOSABS 0.0 02/03/2016 0852      Chemistry      Component Value Date/Time   NA 137 02/03/2016 0852   K 4.1 02/03/2016 0852   CL 107 02/03/2016 0852   CO2 26 02/03/2016 0852   BUN 12 02/03/2016 0852   CREATININE 1.40 (H) 02/03/2016 0852   CREATININE 1.59 (H) 06/10/2015 1601      Component Value Date/Time   CALCIUM 8.2 (L) 02/03/2016 0852   ALKPHOS 124 02/03/2016 0852   AST 33 02/03/2016 0852   ALT 22 02/03/2016 0852   BILITOT 0.3 02/03/2016 0852        PENDING LABS:   RADIOGRAPHIC STUDIES:  No results found.   PATHOLOGY:    ASSESSMENT AND PLAN:  Leukemia, lymphocytic, chronic (HCC) CLL, requiring treatment due to extensive involvement of CLL in bone marrow aspiration and biopsy in addition to climbing WBC and thrombocytopenia.  S/P 3 cycles of BR (09/08/2015- 12/09/2015) which was discontinued early  due to many treatment delays from cytopenias.  Oncology history is updated.  Labs next week with IVIG infusion: CBC diff, CMET, LDH, IgG, IgA, IgM.  I personally reviewed and went over laboratory results with the patient.  The results are noted within this dictation.    Labs monthly: CBC diff, CMET, LDH, IgG, IgA, IgM.  Return in 12 weeks for follow-up.  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    On monthly IVIG.    Labs monthly: IgG, IgA, IgM.  Hodgkin's disease ([place]) Nodular sclerosis-type Hodgkin Lymphoma diagnosed in 2006.  He remains without recurrence and at this point is considered cured of his disease.  We will continue with observation.   Anemia Multifactorial anemia, normocytic and normochromic, thought to be secondary to past treatment for Hodgkin's Lymphoma, CLL, and Stage III chronic renal disease.  STABLE   ORDERS PLACED FOR THIS ENCOUNTER: Orders Placed This Encounter  Procedures  . CBC with Differential  . Comprehensive metabolic panel  . Lactate dehydrogenase  . IgG, IgA, IgM    MEDICATIONS PRESCRIBED THIS ENCOUNTER: No orders of the defined types were placed in this encounter.   THERAPY PLAN:  Continue with monthly IVIG.  All questions were answered. The patient knows to call the clinic with any problems, questions or concerns. We can certainly see the patient much sooner if necessary.  Patient and plan discussed with Dr. Ancil Linsey and she is in agreement with the aforementioned.   This note is electronically signed by: Robynn Pane, PA-C 03/02/2016 6:01 PM

## 2016-03-02 NOTE — Assessment & Plan Note (Signed)
Nodular sclerosis-type Hodgkin Lymphoma diagnosed in 2006.  He remains without recurrence and at this point is considered cured of his disease.  We will continue with observation.

## 2016-03-02 NOTE — Assessment & Plan Note (Addendum)
CLL, requiring treatment due to extensive involvement of CLL in bone marrow aspiration and biopsy in addition to climbing WBC and thrombocytopenia.  S/P 3 cycles of BR (09/08/2015- 12/09/2015) which was discontinued early due to many treatment delays from cytopenias.  Oncology history is updated.  Labs next week with IVIG infusion: CBC diff, CMET, LDH, IgG, IgA, IgM.  I personally reviewed and went over laboratory results with the patient.  The results are noted within this dictation.    Labs monthly: CBC diff, CMET, LDH, IgG, IgA, IgM.  Return in 12 weeks for follow-up.

## 2016-03-02 NOTE — Patient Instructions (Signed)
[place] at [facility] Discharge Instructions  RECOMMENDATIONS MADE BY THE CONSULTANT AND ANY TEST RESULTS WILL BE SENT TO YOUR REFERRING PHYSICIAN.  Exam with Robynn Pane, PA. IVIG monthly. Return in 3 months for follow up with the doctor.   Please see Amy as you leave today for appointments.    Thank you for choosing [place] at [facility] to provide your oncology and hematology care.  To afford each patient quality time with our provider, please arrive at least 15 minutes before your scheduled appointment time.   Beginning January 23rd 2017 lab work for the [facility] will be done in the  Main lab at [facility] on 1st floor. If you have a lab appointment with the [facility] please come in thru the  Main Entrance and check in at the main information desk  You need to re-schedule your appointment should you arrive 10 or more minutes late.  We strive to give you quality time with our providers, and arriving late affects you and other patients whose appointments are after yours.  Also, if you no show three or more times for appointments you may be dismissed from the clinic at the providers discretion.     Again, thank you for choosing [facility].  Our hope is that these requests will decrease the amount of time that you wait before being seen by our physicians.       _____________________________________________________________  Should you have questions after your visit to [facility], please contact our office at [phone] between the hours of 8:30 a.m. and 4:30 p.m.  Voicemails left after 4:30 p.m. will not be returned until the following business day.  For prescription refill requests, have your pharmacy contact our office.         Resources For Cancer Patients and their Caregivers ? American Cancer Society: Can assist with transportation, wigs, general needs, runs Look Good Feel Better.         684-493-9139 ? Cancer Care: Provides financial assistance, online support groups, medication/co-pay assistance.  1-800-813-HOPE (908)760-0814) ? [place] Assists [place] cancer patients and their families through emotional , educational and financial support.  (236)365-7557 ? [place] DSS Where to apply for food stamps, Medicaid and utility assistance. 918-367-6023 ? [facility]: Transportation to medical appointments. 504-073-5279 ? Social Security Administration: May apply for disability if have a Stage IV cancer. (671)204-7675 (352) 810-6526 ? [employer], Disability and Transit Services: Assists with nutrition, care and transit needs. [place] Support Programs: @10RELATIVEDAYS @ > Cancer Support Group  2nd Tuesday of the month 1pm-2pm, Journey Room  > Creative Journey  3rd Tuesday of the month 1130am-1pm, Journey Room  > Look Good Feel Better  1st Wednesday of the month 10am-12 noon, Journey Room (Call [place] to register [phone])

## 2016-03-02 NOTE — Assessment & Plan Note (Signed)
Multifactorial anemia, normocytic and normochromic, thought to be secondary to past treatment for Hodgkin's Lymphoma, CLL, and Stage III chronic renal disease.  STABLE

## 2016-03-02 NOTE — Assessment & Plan Note (Signed)
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    On monthly IVIG.    Labs monthly: IgG, IgA, IgM.

## 2016-03-03 ENCOUNTER — Ambulatory Visit (HOSPITAL_COMMUNITY): Payer: Medicare Other

## 2016-03-10 ENCOUNTER — Encounter (HOSPITAL_BASED_OUTPATIENT_CLINIC_OR_DEPARTMENT_OTHER): Payer: Medicare Other

## 2016-03-10 ENCOUNTER — Ambulatory Visit (HOSPITAL_COMMUNITY): Payer: Medicare Other

## 2016-03-10 VITALS — BP 114/57 | HR 65 | Temp 97.5°F | Resp 18 | Wt 206.8 lb

## 2016-03-10 DIAGNOSIS — D801 Nonfamilial hypogammaglobulinemia: Secondary | ICD-10-CM | POA: Diagnosis not present

## 2016-03-10 DIAGNOSIS — C9110 Chronic lymphocytic leukemia of B-cell type not having achieved remission: Secondary | ICD-10-CM

## 2016-03-10 DIAGNOSIS — Z23 Encounter for immunization: Secondary | ICD-10-CM | POA: Diagnosis not present

## 2016-03-10 DIAGNOSIS — C8190 Hodgkin lymphoma, unspecified, unspecified site: Secondary | ICD-10-CM | POA: Diagnosis not present

## 2016-03-10 LAB — CBC WITH DIFFERENTIAL/PLATELET
Basophils Absolute: 0.1 10*3/uL (ref 0.0–0.1)
Basophils Relative: 1 %
EOS PCT: 9 %
Eosinophils Absolute: 0.8 10*3/uL — ABNORMAL HIGH (ref 0.0–0.7)
HEMATOCRIT: 34.3 % — AB (ref 39.0–52.0)
HEMOGLOBIN: 11.2 g/dL — AB (ref 13.0–17.0)
LYMPHS ABS: 4.5 10*3/uL — AB (ref 0.7–4.0)
LYMPHS PCT: 53 %
MCH: 32.9 pg (ref 26.0–34.0)
MCHC: 32.7 g/dL (ref 30.0–36.0)
MCV: 100.9 fL — AB (ref 78.0–100.0)
MONOS PCT: 8 %
Monocytes Absolute: 0.7 10*3/uL (ref 0.1–1.0)
NEUTROS ABS: 2.5 10*3/uL (ref 1.7–7.7)
Neutrophils Relative %: 29 %
Platelets: 100 10*3/uL — ABNORMAL LOW (ref 150–400)
RBC: 3.4 MIL/uL — ABNORMAL LOW (ref 4.22–5.81)
RDW: 15.3 % (ref 11.5–15.5)
WBC: 8.6 10*3/uL (ref 4.0–10.5)

## 2016-03-10 LAB — COMPREHENSIVE METABOLIC PANEL
ALBUMIN: 3.5 g/dL (ref 3.5–5.0)
ALK PHOS: 89 U/L (ref 38–126)
ALT: 15 U/L — ABNORMAL LOW (ref 17–63)
ANION GAP: 8 (ref 5–15)
AST: 25 U/L (ref 15–41)
BILIRUBIN TOTAL: 0.4 mg/dL (ref 0.3–1.2)
BUN: 16 mg/dL (ref 6–20)
CALCIUM: 8.7 mg/dL — AB (ref 8.9–10.3)
CO2: 25 mmol/L (ref 22–32)
Chloride: 104 mmol/L (ref 101–111)
Creatinine, Ser: 1.52 mg/dL — ABNORMAL HIGH (ref 0.61–1.24)
GFR calc Af Amer: 51 mL/min — ABNORMAL LOW (ref >60)
GFR, EST NON AFRICAN AMERICAN: 44 mL/min — AB (ref >60)
GLUCOSE: 113 mg/dL — AB (ref 65–99)
POTASSIUM: 4.3 mmol/L (ref 3.5–5.1)
Sodium: 137 mmol/L (ref 135–145)
TOTAL PROTEIN: 6.3 g/dL — AB (ref 6.5–8.1)

## 2016-03-10 LAB — LACTATE DEHYDROGENASE: LDH: 159 U/L (ref 98–192)

## 2016-03-10 MED ORDER — DEXTROSE 5 % IV SOLN
INTRAVENOUS | Status: DC
  Administered 2016-03-10: 10:00:00 via INTRAVENOUS

## 2016-03-10 MED ORDER — SODIUM CHLORIDE 0.9% FLUSH
10.0000 mL | INTRAVENOUS | Status: DC | PRN
  Administered 2016-03-10: 10 mL via INTRAVENOUS
  Filled 2016-03-10: qty 10

## 2016-03-10 MED ORDER — IMMUNE GLOBULIN (HUMAN) 20 GM/200ML IV SOLN
0.4000 g/kg | Freq: Once | INTRAVENOUS | Status: AC
  Administered 2016-03-10: 35 g via INTRAVENOUS
  Filled 2016-03-10: qty 50

## 2016-03-10 MED ORDER — HEPARIN SOD (PORK) LOCK FLUSH 100 UNIT/ML IV SOLN
500.0000 [IU] | Freq: Once | INTRAVENOUS | Status: AC
  Administered 2016-03-10: 500 [IU] via INTRAVENOUS

## 2016-03-10 MED ORDER — DIPHENHYDRAMINE HCL 25 MG PO TABS
25.0000 mg | ORAL_TABLET | Freq: Once | ORAL | Status: DC
  Filled 2016-03-10: qty 1

## 2016-03-10 MED ORDER — ACETAMINOPHEN 325 MG PO TABS
650.0000 mg | ORAL_TABLET | Freq: Four times a day (QID) | ORAL | Status: DC | PRN
Start: 2016-03-10 — End: 2016-03-10
  Administered 2016-03-10: 650 mg via ORAL
  Filled 2016-03-10: qty 2

## 2016-03-10 MED ORDER — DIPHENHYDRAMINE HCL 25 MG PO CAPS
25.0000 mg | ORAL_CAPSULE | Freq: Once | ORAL | Status: AC
  Administered 2016-03-10: 25 mg via ORAL
  Filled 2016-03-10: qty 1

## 2016-03-10 MED ORDER — SODIUM CHLORIDE 0.9 % IV SOLN: Freq: Once | INTRAVENOUS | Status: DC

## 2016-03-10 MED ORDER — HEPARIN SOD (PORK) LOCK FLUSH 100 UNIT/ML IV SOLN
INTRAVENOUS | Status: AC
  Filled 2016-03-10: qty 5

## 2016-03-10 NOTE — Patient Instructions (Signed)
[place] at [facility] Discharge Instructions  RECOMMENDATIONS MADE BY THE CONSULTANT AND ANY TEST RESULTS WILL BE SENT TO YOUR REFERRING PHYSICIAN.  Received IVIG infusion today. Follow-up as scheduled. Call clinic for any questions or concerns  Thank you for choosing [place] at [facility] to provide your oncology and hematology care.  To afford each patient quality time with our provider, please arrive at least 15 minutes before your scheduled appointment time.   Beginning January 23rd 2017 lab work for the [facility] will be done in the  Main lab at [facility] on 1st floor. If you have a lab appointment with the [facility] please come in thru the  Main Entrance and check in at the main information desk  You need to re-schedule your appointment should you arrive 10 or more minutes late.  We strive to give you quality time with our providers, and arriving late affects you and other patients whose appointments are after yours.  Also, if you no show three or more times for appointments you may be dismissed from the clinic at the providers discretion.     Again, thank you for choosing [facility].  Our hope is that these requests will decrease the amount of time that you wait before being seen by our physicians.       _____________________________________________________________  Should you have questions after your visit to [facility], please contact our office at [phone] between the hours of 8:30 a.m. and 4:30 p.m.  Voicemails left after 4:30 p.m. will not be returned until the following business day.  For prescription refill requests, have your pharmacy contact our office.         Resources For Cancer Patients and their Caregivers ? American Cancer Society: Can assist with transportation, wigs, general needs, runs Look Good Feel Better.        804-019-3281 ? Cancer Care: Provides financial  assistance, online support groups, medication/co-pay assistance.  1-800-813-HOPE 8255194919) ? [place] Assists [place] cancer patients and their families through emotional , educational and financial support.  984-475-9951 ? [place] DSS Where to apply for food stamps, Medicaid and utility assistance. 442-728-0934 ? RCATS: Transportation to medical appointments. (479)326-4900 ? Social Security Administration: May apply for disability if have a Stage IV cancer. 210-829-8759 [phone])029-8886 ? [employer], Disability and Transit Services: Assists with nutrition, care and transit needs. [place] Support Programs: @10RELATIVEDAYS @ > Cancer Support Group  2nd Tuesday of the month 1pm-2pm, Journey Room  > Creative Journey  3rd Tuesday of the month 1130am-1pm, Journey Room  > Look Good Feel Better  1st Wednesday of the month 10am-12 noon, Journey Room (Call [place] to register [phone])

## 2016-03-10 NOTE — Progress Notes (Signed)
[name] tolerated IVIG well without complaints or incident. VSS upon discharge. Pt discharged self ambulatory in satisfactory condition

## 2016-03-11 LAB — IGG, IGA, IGM
IGA: 6 mg/dL — AB (ref 61–437)
IGM, SERUM: 7 mg/dL — AB (ref 15–143)
IgG (Immunoglobin G), Serum: 760 mg/dL (ref 700–1600)

## 2016-03-23 ENCOUNTER — Other Ambulatory Visit (HOSPITAL_COMMUNITY): Payer: Self-pay | Admitting: Oncology

## 2016-03-25 ENCOUNTER — Ambulatory Visit (INDEPENDENT_AMBULATORY_CARE_PROVIDER_SITE_OTHER): Payer: Medicare Other | Admitting: Family Medicine

## 2016-03-25 ENCOUNTER — Encounter: Payer: Self-pay | Admitting: Family Medicine

## 2016-03-25 VITALS — BP 130/70 | HR 70 | Resp 16 | Ht 70.0 in | Wt 208.0 lb

## 2016-03-25 DIAGNOSIS — E559 Vitamin D deficiency, unspecified: Secondary | ICD-10-CM

## 2016-03-25 DIAGNOSIS — N489 Disorder of penis, unspecified: Secondary | ICD-10-CM

## 2016-03-25 DIAGNOSIS — E782 Mixed hyperlipidemia: Secondary | ICD-10-CM

## 2016-03-25 DIAGNOSIS — R7303 Prediabetes: Secondary | ICD-10-CM

## 2016-03-25 DIAGNOSIS — N189 Chronic kidney disease, unspecified: Secondary | ICD-10-CM

## 2016-03-25 DIAGNOSIS — Z125 Encounter for screening for malignant neoplasm of prostate: Secondary | ICD-10-CM | POA: Diagnosis not present

## 2016-03-25 DIAGNOSIS — Z1211 Encounter for screening for malignant neoplasm of colon: Secondary | ICD-10-CM | POA: Insufficient documentation

## 2016-03-25 DIAGNOSIS — D631 Anemia in chronic kidney disease: Secondary | ICD-10-CM

## 2016-03-25 DIAGNOSIS — Z0000 Encounter for general adult medical examination without abnormal findings: Secondary | ICD-10-CM

## 2016-03-25 DIAGNOSIS — I10 Essential (primary) hypertension: Secondary | ICD-10-CM

## 2016-03-25 LAB — POC HEMOCCULT BLD/STL (OFFICE/1-CARD/DIAGNOSTIC): FECAL OCCULT BLD: NEGATIVE

## 2016-03-25 NOTE — Assessment & Plan Note (Signed)
Annual exam as documented. . Immunization and cancer screening needs are specifically addressed at this visit.  

## 2016-03-25 NOTE — Progress Notes (Signed)
   [name]     MRN: [id number]      DOB: 03/21/1946   HPI: Patient is in for annual physical exam. C/o red area on penis , irritated with bad odor, not going away Recent labs, if available are reviewed. Immunization held, getting chemo and low white cell and platelet counts.    PE; BP 130/70   Pulse 70   Resp 16   Ht 5\' 10"  (1.778 m)   Wt 208 lb (94.3 kg)   SpO2 100%   BMI 29.84 kg/m   Pleasant male, alert and oriented x 3, in no cardio-pulmonary distress. Afebrile. HEENT No facial trauma or asymetry. Sinuses non tender. EOMI, . External ears normal, tympanic membranes clear. Oropharynx moist, no exudate. Neck: supple, no adenopathy,JVD or thyromegaly.No bruits.  Chest: Clear to ascultation bilaterally.No crackles or wheezes. Non tender to palpation  Breast: No asymetry,no masses. No nipple discharge or inversion. No axillary or supraclavicular adenopathy  Cardiovascular system; Heart sounds normal,  S1 and  S2 ,no S3.  Systolic  murmur, or thrill. Apical beat not displaced Peripheral pulses normal.  Abdomen: Soft, non tender, no organomegaly or masses. No bruits. Bowel sounds normal. No guarding, tenderness or rebound.  Rectal:  Normal sphincter tone. No hemorrhoids or  masses. guaiac negative stool. Prostate smooth and firm  GU: erythematous ulcerative appearing lesion on shaft of penis, maximum diameter approx 2.2 cm, malodorous  Musculoskeletal exam: Decreased ROM of spine, normal in  hips , shoulders and knees. No deformity ,swelling or crepitus noted. No muscle wasting or atrophy.   Neurologic: Cranial nerves 2 to 12 intact. Power, tone ,sensation and reflexes normal throughout. disturbance in gait. No tremor.  Skin: Intact, no ulceration, erythema , scaling or rash noted. Pigmentation normal throughout  Psych; Normal mood and affect. Judgement and concentration normal   Assessment & Plan:  Annual physical exam Annual exam as  documented.  Immunization and cancer screening needs are specifically addressed at this visit.   Penile lesion Erythematous , ulcerative , malodorous lesion on penile shaft, approx 2.2 cm in length, irregular border, refer to urology to further evaluate  Special screening for malignant neoplasms, colon Heme negative stool, no rectal mass

## 2016-03-25 NOTE — Assessment & Plan Note (Signed)
Heme negative stool , no rectal mass 

## 2016-03-25 NOTE — Patient Instructions (Addendum)
F/u in April, please cancel feb follow up  You are referred to urology regarding lesion Fasting lipid, cmp,Vit d [facility] , pSA March 31 or after  Please work on good  health habits so that your health will improve. 1. Commitment to daily physical activity for 30 to 60  minutes, if you are able to do this.  2. Commitment to wise food choices. Aim for half of your  food intake to be vegetable and fruit, one quarter starchy foods, and one quarter protein. Try to eat on a regular schedule  3 meals per day, snacking between meals should be limited to vegetables or fruits or small portions of nuts. 64 ounces of water per day is generally recommended, unless you have specific health conditions, like heart failure or kidney failure where you will need to limit fluid intake.  3. Commitment to sufficient and a  good quality of physical and mental rest daily, generally between 6 to 8 hours per day.  WITH PERSISTANCE AND PERSEVERANCE, THE IMPOSSIBLE , BECOMES THE NORM! Thank you  for choosing [facility]. We consider it a privelige to serve you.  Delivering excellent health care in a caring and  compassionate way is our goal.  Partnering with you,  so that together we can achieve this goal is our strategy.

## 2016-03-25 NOTE — Assessment & Plan Note (Signed)
Erythematous , ulcerative , malodorous lesion on penile shaft, approx 2.2 cm in length, irregular border, refer to urology to further evaluate

## 2016-04-07 ENCOUNTER — Encounter (HOSPITAL_COMMUNITY): Payer: Medicare Other | Attending: Oncology

## 2016-04-07 VITALS — BP 113/55 | HR 75 | Temp 97.4°F | Resp 18 | Wt 210.4 lb

## 2016-04-07 DIAGNOSIS — D801 Nonfamilial hypogammaglobulinemia: Secondary | ICD-10-CM

## 2016-04-07 DIAGNOSIS — C9110 Chronic lymphocytic leukemia of B-cell type not having achieved remission: Secondary | ICD-10-CM | POA: Diagnosis not present

## 2016-04-07 DIAGNOSIS — Z23 Encounter for immunization: Secondary | ICD-10-CM | POA: Insufficient documentation

## 2016-04-07 DIAGNOSIS — C8190 Hodgkin lymphoma, unspecified, unspecified site: Secondary | ICD-10-CM | POA: Diagnosis not present

## 2016-04-07 LAB — CBC WITH DIFFERENTIAL/PLATELET
BASOS ABS: 0 10*3/uL (ref 0.0–0.1)
Basophils Relative: 0 %
EOS ABS: 0.6 10*3/uL (ref 0.0–0.7)
Eosinophils Relative: 6 %
HCT: 33.7 % — ABNORMAL LOW (ref 39.0–52.0)
Hemoglobin: 11.2 g/dL — ABNORMAL LOW (ref 13.0–17.0)
LYMPHS ABS: 7.3 10*3/uL — AB (ref 0.7–4.0)
Lymphocytes Relative: 67 %
MCH: 32.9 pg (ref 26.0–34.0)
MCHC: 33.2 g/dL (ref 30.0–36.0)
MCV: 99.1 fL (ref 78.0–100.0)
MONO ABS: 0.6 10*3/uL (ref 0.1–1.0)
Monocytes Relative: 6 %
Neutro Abs: 2.2 10*3/uL (ref 1.7–7.7)
Neutrophils Relative %: 21 %
PLATELETS: 89 10*3/uL — AB (ref 150–400)
RBC: 3.4 MIL/uL — AB (ref 4.22–5.81)
RDW: 14.8 % (ref 11.5–15.5)
WBC: 10.7 10*3/uL — AB (ref 4.0–10.5)

## 2016-04-07 LAB — COMPREHENSIVE METABOLIC PANEL
ALBUMIN: 3.5 g/dL (ref 3.5–5.0)
ALT: 19 U/L (ref 17–63)
AST: 28 U/L (ref 15–41)
Alkaline Phosphatase: 84 U/L (ref 38–126)
Anion gap: 6 (ref 5–15)
BUN: 20 mg/dL (ref 6–20)
CHLORIDE: 106 mmol/L (ref 101–111)
CO2: 26 mmol/L (ref 22–32)
Calcium: 8.7 mg/dL — ABNORMAL LOW (ref 8.9–10.3)
Creatinine, Ser: 1.69 mg/dL — ABNORMAL HIGH (ref 0.61–1.24)
GFR calc Af Amer: 45 mL/min — ABNORMAL LOW (ref >60)
GFR, EST NON AFRICAN AMERICAN: 39 mL/min — AB (ref >60)
Glucose, Bld: 127 mg/dL — ABNORMAL HIGH (ref 65–99)
POTASSIUM: 4.5 mmol/L (ref 3.5–5.1)
Sodium: 138 mmol/L (ref 135–145)
Total Bilirubin: 0.5 mg/dL (ref 0.3–1.2)
Total Protein: 6.3 g/dL — ABNORMAL LOW (ref 6.5–8.1)

## 2016-04-07 LAB — LACTATE DEHYDROGENASE: LDH: 166 U/L (ref 98–192)

## 2016-04-07 MED ORDER — HEPARIN SOD (PORK) LOCK FLUSH 100 UNIT/ML IV SOLN
500.0000 [IU] | Freq: Once | INTRAVENOUS | Status: AC
  Administered 2016-04-07: 500 [IU] via INTRAVENOUS

## 2016-04-07 MED ORDER — ACETAMINOPHEN 325 MG PO TABS
650.0000 mg | ORAL_TABLET | Freq: Four times a day (QID) | ORAL | Status: DC | PRN
  Administered 2016-04-07: 650 mg via ORAL
  Filled 2016-04-07: qty 2

## 2016-04-07 MED ORDER — DIPHENHYDRAMINE HCL 25 MG PO TABS
25.0000 mg | ORAL_TABLET | Freq: Once | ORAL | Status: DC
  Filled 2016-04-07: qty 1

## 2016-04-07 MED ORDER — DIPHENHYDRAMINE HCL 25 MG PO CAPS
25.0000 mg | ORAL_CAPSULE | Freq: Once | ORAL | Status: AC
  Administered 2016-04-07: 25 mg via ORAL
  Filled 2016-04-07: qty 1

## 2016-04-07 MED ORDER — IMMUNE GLOBULIN (HUMAN) 20 GM/200ML IV SOLN
0.4000 g/kg | Freq: Once | INTRAVENOUS | Status: AC
  Administered 2016-04-07: 40 g via INTRAVENOUS
  Filled 2016-04-07: qty 200

## 2016-04-07 MED ORDER — SODIUM CHLORIDE 0.9% FLUSH
10.0000 mL | INTRAVENOUS | Status: DC | PRN
  Administered 2016-04-07: 10 mL via INTRAVENOUS
  Filled 2016-04-07: qty 10

## 2016-04-07 MED ORDER — HEPARIN SOD (PORK) LOCK FLUSH 100 UNIT/ML IV SOLN
INTRAVENOUS | Status: AC
  Filled 2016-04-07: qty 5

## 2016-04-07 MED ORDER — DEXTROSE 5 % IV SOLN
INTRAVENOUS | Status: DC
  Administered 2016-04-07: 10:00:00 via INTRAVENOUS

## 2016-04-07 NOTE — Progress Notes (Signed)
[name] tolerated IVIG infusion well without complaints or incident. VSS upon discharge. Pt discharged self ambulatory in satisfactory condition

## 2016-04-07 NOTE — Patient Instructions (Signed)
[facility] at [facility] Discharge Instructions  RECOMMENDATIONS MADE BY THE CONSULTANT AND ANY TEST RESULTS WILL BE SENT TO YOUR REFERRING PHYSICIAN.  Received IVIG infusion today. Follow-up as scheduled. Call clinic for any questions or concerns  Thank you for choosing [facility] at [facility] to provide your oncology and hematology care.  To afford each patient quality time with our provider, please arrive at least 15 minutes before your scheduled appointment time.    If you have a lab appointment with the Cancer Center please come in thru the  Main Entrance and check in at the main information desk  You need to re-schedule your appointment should you arrive 10 or more minutes late.  We strive to give you quality time with our providers, and arriving late affects you and other patients whose appointments are after yours.  Also, if you no show three or more times for appointments you may be dismissed from the clinic at the providers discretion.     Again, thank you for choosing [facility].  Our hope is that these requests will decrease the amount of time that you wait before being seen by our physicians.       _____________________________________________________________  Should you have questions after your visit to [facility], please contact our office at [phone] between the hours of 8:30 a.m. and 4:30 p.m.  Voicemails left after 4:30 p.m. will not be returned until the following business day.  For prescription refill requests, have your pharmacy contact our office.       Resources For Cancer Patients and their Caregivers ? American Cancer Society: Can assist with transportation, wigs, general needs, runs Look Good Feel Better.        1-888-227-6333 ? Cancer Care: Provides financial assistance, online support groups, medication/co-pay assistance.  1-800-813-HOPE (4673) ? [facility] Assists [place] cancer patients and their families through emotional , educational and financial support.  336-427-4357 ? [place] DSS Where to apply for food stamps, Medicaid and utility assistance. 336-342-1394 ? RCATS: Transportation to medical appointments. 336-347-2287 ? Social Security Administration: May apply for disability if have a Stage IV cancer. 336-342-7796 1-800-772-1213 ? [place] Aging, Disability and Transit Services: Assists with nutrition, care and transit needs. 336-349-2343  Cancer Center Support Programs: @10RELATIVEDAYS@ > Cancer Support Group  2nd Tuesday of the month 1pm-2pm, Journey Room  > Creative Journey  3rd Tuesday of the month 1130am-1pm, Journey Room  > Look Good Feel Better  1st Wednesday of the month 10am-12 noon, Journey Room (Call American Cancer Society to register 1-800-395-5775)   

## 2016-04-08 LAB — IGG, IGA, IGM
IgA: 5 mg/dL — ABNORMAL LOW (ref 61–437)
IgG (Immunoglobin G), Serum: 728 mg/dL (ref 700–1600)
IgM, Serum: 8 mg/dL — ABNORMAL LOW (ref 15–143)

## 2016-04-09 ENCOUNTER — Ambulatory Visit (HOSPITAL_COMMUNITY): Payer: Medicare Other

## 2016-04-29 ENCOUNTER — Ambulatory Visit: Payer: Medicare Other | Admitting: Family Medicine

## 2016-05-05 ENCOUNTER — Ambulatory Visit (INDEPENDENT_AMBULATORY_CARE_PROVIDER_SITE_OTHER): Payer: Medicare Other | Admitting: Urology

## 2016-05-05 DIAGNOSIS — R102 Pelvic and perineal pain: Secondary | ICD-10-CM | POA: Diagnosis not present

## 2016-05-05 DIAGNOSIS — N481 Balanitis: Secondary | ICD-10-CM

## 2016-05-06 ENCOUNTER — Other Ambulatory Visit: Payer: Self-pay | Admitting: Urology

## 2016-05-10 ENCOUNTER — Encounter (HOSPITAL_COMMUNITY): Payer: Medicare Other | Attending: Oncology

## 2016-05-10 ENCOUNTER — Encounter (HOSPITAL_COMMUNITY): Payer: Self-pay

## 2016-05-10 VITALS — BP 105/50 | HR 58 | Temp 97.7°F | Resp 18 | Wt 211.4 lb

## 2016-05-10 DIAGNOSIS — C8190 Hodgkin lymphoma, unspecified, unspecified site: Secondary | ICD-10-CM | POA: Insufficient documentation

## 2016-05-10 DIAGNOSIS — D801 Nonfamilial hypogammaglobulinemia: Secondary | ICD-10-CM

## 2016-05-10 DIAGNOSIS — Z23 Encounter for immunization: Secondary | ICD-10-CM | POA: Insufficient documentation

## 2016-05-10 DIAGNOSIS — C9110 Chronic lymphocytic leukemia of B-cell type not having achieved remission: Secondary | ICD-10-CM | POA: Insufficient documentation

## 2016-05-10 LAB — COMPREHENSIVE METABOLIC PANEL
ALT: 16 U/L — AB (ref 17–63)
ANION GAP: 7 (ref 5–15)
AST: 26 U/L (ref 15–41)
Albumin: 3.5 g/dL (ref 3.5–5.0)
Alkaline Phosphatase: 83 U/L (ref 38–126)
BUN: 23 mg/dL — ABNORMAL HIGH (ref 6–20)
CALCIUM: 8.7 mg/dL — AB (ref 8.9–10.3)
CHLORIDE: 105 mmol/L (ref 101–111)
CO2: 25 mmol/L (ref 22–32)
CREATININE: 1.86 mg/dL — AB (ref 0.61–1.24)
GFR, EST AFRICAN AMERICAN: 40 mL/min — AB (ref >60)
GFR, EST NON AFRICAN AMERICAN: 35 mL/min — AB (ref >60)
Glucose, Bld: 122 mg/dL — ABNORMAL HIGH (ref 65–99)
Potassium: 4.7 mmol/L (ref 3.5–5.1)
SODIUM: 137 mmol/L (ref 135–145)
Total Bilirubin: 0.5 mg/dL (ref 0.3–1.2)
Total Protein: 6.3 g/dL — ABNORMAL LOW (ref 6.5–8.1)

## 2016-05-10 LAB — CBC WITH DIFFERENTIAL/PLATELET
Eosinophils Relative: 2 %
HEMATOCRIT: 33.1 % — AB (ref 39.0–52.0)
HEMOGLOBIN: 11.2 g/dL — AB (ref 13.0–17.0)
Lymphocytes Relative: 74 %
MCH: 33.1 pg (ref 26.0–34.0)
MCHC: 33.8 g/dL (ref 30.0–36.0)
MCV: 97.9 fL (ref 78.0–100.0)
Monocytes Relative: 3 %
NEUTROS PCT: 21 %
NRBC: 0 /100{WBCs}
Platelets: 95 10*3/uL — ABNORMAL LOW (ref 150–400)
RBC: 3.38 MIL/uL — AB (ref 4.22–5.81)
RDW: 15.1 % (ref 11.5–15.5)
WBC: 17 10*3/uL — ABNORMAL HIGH (ref 4.0–10.5)

## 2016-05-10 LAB — LACTATE DEHYDROGENASE: LDH: 175 U/L (ref 98–192)

## 2016-05-10 MED ORDER — HEPARIN SOD (PORK) LOCK FLUSH 100 UNIT/ML IV SOLN
500.0000 [IU] | Freq: Once | INTRAVENOUS | Status: AC
  Administered 2016-05-10: 500 [IU] via INTRAVENOUS

## 2016-05-10 MED ORDER — DEXTROSE 5 % IV SOLN
INTRAVENOUS | Status: DC
  Administered 2016-05-10: 10:00:00 via INTRAVENOUS

## 2016-05-10 MED ORDER — ACETAMINOPHEN 325 MG PO TABS
650.0000 mg | ORAL_TABLET | Freq: Four times a day (QID) | ORAL | Status: DC | PRN
  Administered 2016-05-10: 650 mg via ORAL

## 2016-05-10 MED ORDER — ACETAMINOPHEN 325 MG PO TABS
ORAL_TABLET | ORAL | Status: AC
  Filled 2016-05-10: qty 2

## 2016-05-10 MED ORDER — DIPHENHYDRAMINE HCL 25 MG PO CAPS
25.0000 mg | ORAL_CAPSULE | Freq: Once | ORAL | Status: AC
  Administered 2016-05-10: 25 mg via ORAL

## 2016-05-10 MED ORDER — DIPHENHYDRAMINE HCL 25 MG PO CAPS
ORAL_CAPSULE | ORAL | Status: AC
  Filled 2016-05-10: qty 2

## 2016-05-10 MED ORDER — IMMUNE GLOBULIN (HUMAN) 20 GM/200ML IV SOLN
0.4000 g/kg | Freq: Once | INTRAVENOUS | Status: AC
  Administered 2016-05-10: 40 g via INTRAVENOUS
  Filled 2016-05-10: qty 400

## 2016-05-10 MED ORDER — HEPARIN SOD (PORK) LOCK FLUSH 100 UNIT/ML IV SOLN
INTRAVENOUS | Status: AC
  Filled 2016-05-10: qty 5

## 2016-05-10 NOTE — Progress Notes (Signed)
Tolerated infusion w/o adverse reaction.  Alert, in no distress.  VSS.  Discharged ambulatory in c/o family.  

## 2016-05-10 NOTE — Patient Instructions (Signed)
[facility] at [facility] Discharge Instructions  RECOMMENDATIONS MADE BY THE CONSULTANT AND ANY TEST RESULTS WILL BE SENT TO YOUR REFERRING PHYSICIAN.  IVIG infusion today. Return as scheduled.   Thank you for choosing [facility] at [facility] to provide your oncology and hematology care.  To afford each patient quality time with our provider, please arrive at least 15 minutes before your scheduled appointment time.    If you have a lab appointment with the Cancer Center please come in thru the  Main Entrance and check in at the main information desk  You need to re-schedule your appointment should you arrive 10 or more minutes late.  We strive to give you quality time with our providers, and arriving late affects you and other patients whose appointments are after yours.  Also, if you no show three or more times for appointments you may be dismissed from the clinic at the providers discretion.     Again, thank you for choosing [facility].  Our hope is that these requests will decrease the amount of time that you wait before being seen by our physicians.       _____________________________________________________________  Should you have questions after your visit to  Cancer Center, please contact our office at [phone] between the hours of 8:30 a.m. and 4:30 p.m.  Voicemails left after 4:30 p.m. will not be returned until the following business day.  For prescription refill requests, have your pharmacy contact our office.       Resources For Cancer Patients and their Caregivers ? American Cancer Society: Can assist with transportation, wigs, general needs, runs Look Good Feel Better.        1-888-227-6333 ? Cancer Care: Provides financial assistance, online support groups, medication/co-pay assistance.  1-800-813-HOPE (4673) ? [facility] Assists [place] cancer patients and their  families through emotional , educational and financial support.  336-427-4357 ? [place] DSS Where to apply for food stamps, Medicaid and utility assistance. 336-342-1394 ? RCATS: Transportation to medical appointments. 336-347-2287 ? Social Security Administration: May apply for disability if have a Stage IV cancer. 336-342-7796 1-800-772-1213 ? [place] Aging, Disability and Transit Services: Assists with nutrition, care and transit needs. 336-349-2343  Cancer Center Support Programs: @10RELATIVEDAYS@ > Cancer Support Group  2nd Tuesday of the month 1pm-2pm, Journey Room  > Creative Journey  3rd Tuesday of the month 1130am-1pm, Journey Room  > Look Good Feel Better  1st Wednesday of the month 10am-12 noon, Journey Room (Call American Cancer Society to register 1-800-395-5775)   

## 2016-05-11 LAB — IGG, IGA, IGM
IGG (IMMUNOGLOBIN G), SERUM: 742 mg/dL (ref 700–1600)
IGM, SERUM: 6 mg/dL — AB (ref 15–143)
IgA: <5 mg/dL — ABNORMAL LOW (ref 61–437)

## 2016-05-24 ENCOUNTER — Other Ambulatory Visit: Payer: Self-pay | Admitting: Cardiology

## 2016-05-26 ENCOUNTER — Ambulatory Visit: Admit: 2016-05-26 | Payer: Medicare Other | Admitting: Urology

## 2016-05-26 SURGERY — BIOPSY, PENIS
Anesthesia: General

## 2016-06-06 IMAGING — CT CT BIOPSY
1 series · 1 of 32 positions shown · non-contrast
Comparison: none

INDICATION: History of CLL. Please perform CT-guided bone marrow biopsy for
tissue diagnostic purposes.

[Series 3: localizer · axial · 5.0mm · 0.75mm/px · 1 of 38 slices shown]
[im 20/38]
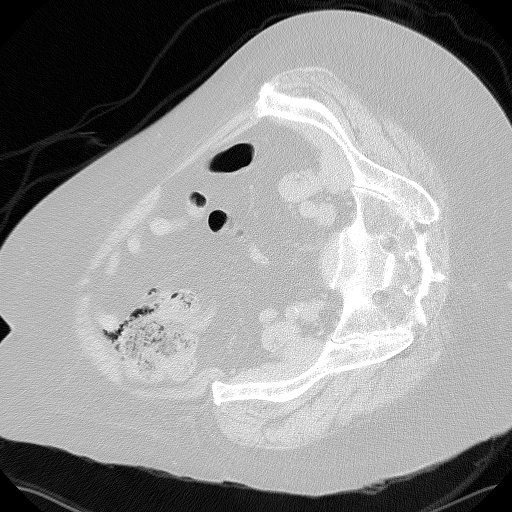

[1 of 32 positions shown; findings below may reference images not displayed]

EXAM:
CT-GUIDED BONE MARROW BIOPSY AND ASPIRATION

MEDICATIONS:
None

ANESTHESIA/SEDATION:
Fentanyl 50 mcg IV; Versed 1.5 mg IV

Sedation Time: 10 minutes; The patient was continuously monitored
during the procedure by the interventional radiology nurse under my
direct supervision.

COMPLICATIONS:
None immediate.

PROCEDURE:
Informed consent was obtained from the patient following an
explanation of the procedure, risks, benefits and alternatives. The
patient understands, agrees and consents for the procedure. All
questions were addressed. A time out was performed prior to the
initiation of the procedure. The patient was positioned right
lateral decubitus and non-contrast localization CT was performed of
the pelvis to demonstrate the iliac marrow spaces. The operative
site was prepped and draped in the usual sterile fashion.

Under sterile conditions and local anesthesia, a 22 gauge spinal
needle was utilized for procedural planning. Next, an 11 gauge
coaxial bone biopsy needle was advanced into the left iliac marrow
space. Needle position was confirmed with CT imaging. Initially,
bone marrow aspiration was performed. Next, a bone marrow biopsy was
obtained with the 11 gauge outer bone marrow device. Samples were
prepared with the cytotechnologist and deemed adequate. The needle
was removed intact. Hemostasis was obtained with compression and a
dressing was placed. The patient tolerated the procedure well
without immediate post procedural complication.
IMPRESSION: Successful CT guided left iliac bone marrow aspiration and core
biopsy.

## 2016-06-08 ENCOUNTER — Encounter (HOSPITAL_COMMUNITY): Payer: Medicare Other

## 2016-06-08 ENCOUNTER — Encounter (HOSPITAL_COMMUNITY): Payer: Self-pay

## 2016-06-08 ENCOUNTER — Encounter (HOSPITAL_COMMUNITY): Payer: Medicare Other | Attending: Oncology | Admitting: Oncology

## 2016-06-08 ENCOUNTER — Encounter (HOSPITAL_BASED_OUTPATIENT_CLINIC_OR_DEPARTMENT_OTHER): Payer: Medicare Other

## 2016-06-08 VITALS — BP 119/63 | HR 83 | Temp 97.5°F | Resp 18 | Wt 213.3 lb

## 2016-06-08 VITALS — BP 102/56 | HR 72 | Temp 98.1°F | Resp 16

## 2016-06-08 DIAGNOSIS — D801 Nonfamilial hypogammaglobulinemia: Secondary | ICD-10-CM

## 2016-06-08 DIAGNOSIS — C8190 Hodgkin lymphoma, unspecified, unspecified site: Secondary | ICD-10-CM | POA: Diagnosis not present

## 2016-06-08 DIAGNOSIS — Z23 Encounter for immunization: Secondary | ICD-10-CM | POA: Diagnosis not present

## 2016-06-08 DIAGNOSIS — N183 Chronic kidney disease, stage 3 (moderate): Secondary | ICD-10-CM | POA: Diagnosis not present

## 2016-06-08 DIAGNOSIS — L988 Other specified disorders of the skin and subcutaneous tissue: Secondary | ICD-10-CM | POA: Diagnosis not present

## 2016-06-08 DIAGNOSIS — C9110 Chronic lymphocytic leukemia of B-cell type not having achieved remission: Secondary | ICD-10-CM | POA: Insufficient documentation

## 2016-06-08 DIAGNOSIS — Z95828 Presence of other vascular implants and grafts: Secondary | ICD-10-CM

## 2016-06-08 LAB — CBC WITH DIFFERENTIAL/PLATELET
BASOS PCT: 1 %
BLASTS: 0 %
Band Neutrophils: 0 %
Basophils Absolute: 0.2 10*3/uL — ABNORMAL HIGH (ref 0.0–0.1)
Eosinophils Absolute: 1.1 10*3/uL — ABNORMAL HIGH (ref 0.0–0.7)
Eosinophils Relative: 6 %
HCT: 33.3 % — ABNORMAL LOW (ref 39.0–52.0)
HEMOGLOBIN: 11.1 g/dL — AB (ref 13.0–17.0)
LYMPHS PCT: 86 %
Lymphs Abs: 16.3 10*3/uL — ABNORMAL HIGH (ref 0.7–4.0)
MCH: 33.2 pg (ref 26.0–34.0)
MCHC: 33.3 g/dL (ref 30.0–36.0)
MCV: 99.7 fL (ref 78.0–100.0)
Metamyelocytes Relative: 0 %
Monocytes Absolute: 0.4 10*3/uL (ref 0.1–1.0)
Monocytes Relative: 2 %
Myelocytes: 0 %
NEUTROS PCT: 5 %
NRBC: 0 /100{WBCs}
Neutro Abs: 1 10*3/uL — ABNORMAL LOW (ref 1.7–7.7)
OTHER: 0 %
PROMYELOCYTES ABS: 0 %
Platelets: 87 10*3/uL — ABNORMAL LOW (ref 150–400)
RBC: 3.34 MIL/uL — ABNORMAL LOW (ref 4.22–5.81)
RDW: 16.2 % — ABNORMAL HIGH (ref 11.5–15.5)
WBC: 19 10*3/uL — ABNORMAL HIGH (ref 4.0–10.5)

## 2016-06-08 LAB — LACTATE DEHYDROGENASE: LDH: 208 U/L — AB (ref 98–192)

## 2016-06-08 LAB — COMPREHENSIVE METABOLIC PANEL
ALK PHOS: 87 U/L (ref 38–126)
ALT: 17 U/L (ref 17–63)
AST: 27 U/L (ref 15–41)
Albumin: 3.4 g/dL — ABNORMAL LOW (ref 3.5–5.0)
Anion gap: 5 (ref 5–15)
BUN: 21 mg/dL — ABNORMAL HIGH (ref 6–20)
CALCIUM: 8.5 mg/dL — AB (ref 8.9–10.3)
CO2: 27 mmol/L (ref 22–32)
CREATININE: 1.94 mg/dL — AB (ref 0.61–1.24)
Chloride: 102 mmol/L (ref 101–111)
GFR, EST AFRICAN AMERICAN: 38 mL/min — AB (ref >60)
GFR, EST NON AFRICAN AMERICAN: 33 mL/min — AB (ref >60)
Glucose, Bld: 70 mg/dL (ref 65–99)
Potassium: 5.1 mmol/L (ref 3.5–5.1)
Sodium: 134 mmol/L — ABNORMAL LOW (ref 135–145)
Total Bilirubin: 0.5 mg/dL (ref 0.3–1.2)
Total Protein: 7.3 g/dL (ref 6.5–8.1)

## 2016-06-08 MED ORDER — SODIUM CHLORIDE 0.9 % IV SOLN: Freq: Once | INTRAVENOUS | Status: DC

## 2016-06-08 MED ORDER — DIPHENHYDRAMINE HCL 25 MG PO CAPS
25.0000 mg | ORAL_CAPSULE | Freq: Once | ORAL | Status: AC
Start: 2016-06-08 — End: 2016-06-08
  Administered 2016-06-08: 25 mg via ORAL
  Filled 2016-06-08: qty 1

## 2016-06-08 MED ORDER — IMMUNE GLOBULIN (HUMAN) 20 GM/200ML IV SOLN
40.0000 g | Freq: Once | INTRAVENOUS | Status: AC
  Administered 2016-06-08: 40 g via INTRAVENOUS
  Filled 2016-06-08: qty 400

## 2016-06-08 MED ORDER — ACETAMINOPHEN 325 MG PO TABS
650.0000 mg | ORAL_TABLET | Freq: Four times a day (QID) | ORAL | Status: DC | PRN
  Administered 2016-06-08: 650 mg via ORAL
  Filled 2016-06-08: qty 2

## 2016-06-08 MED ORDER — HEPARIN SOD (PORK) LOCK FLUSH 100 UNIT/ML IV SOLN
INTRAVENOUS | Status: AC
  Filled 2016-06-08: qty 5

## 2016-06-08 MED ORDER — HEPARIN SOD (PORK) LOCK FLUSH 100 UNIT/ML IV SOLN
500.0000 [IU] | Freq: Once | INTRAVENOUS | Status: AC
  Administered 2016-06-08: 500 [IU] via INTRAVENOUS

## 2016-06-08 MED ORDER — DEXTROSE 5 % IV SOLN
INTRAVENOUS | Status: DC
  Administered 2016-06-08: 10:00:00 via INTRAVENOUS

## 2016-06-08 NOTE — Patient Instructions (Signed)
[place] at [facility] Discharge Instructions  RECOMMENDATIONS MADE BY THE CONSULTANT AND ANY TEST RESULTS WILL BE SENT TO YOUR REFERRING PHYSICIAN.  You were seen today by Dr. Twana [facility] and treatment today IVIG every 4 weeks Follow up in 8 weeks with Gershon Mussel See Amy up front for appointments   Thank you for choosing [place] at [facility] to provide your oncology and hematology care.  To afford each patient quality time with our provider, please arrive at least 15 minutes before your scheduled appointment time.    If you have a lab appointment with the [facility] please come in thru the  Main Entrance and check in at the main information desk  You need to re-schedule your appointment should you arrive 10 or more minutes late.  We strive to give you quality time with our providers, and arriving late affects you and other patients whose appointments are after yours.  Also, if you no show three or more times for appointments you may be dismissed from the clinic at the providers discretion.     Again, thank you for choosing [facility].  Our hope is that these requests will decrease the amount of time that you wait before being seen by our physicians.       _____________________________________________________________  Should you have questions after your visit to [facility], please contact our office at [phone] between the hours of 8:30 a.m. and 4:30 p.m.  Voicemails left after 4:30 p.m. will not be returned until the following business day.  For prescription refill requests, have your pharmacy contact our office.       Resources For Cancer Patients and their Caregivers ? American Cancer Society: Can assist with transportation, wigs, general needs, runs Look Good Feel Better.        [phone] ? Cancer Care: Provides financial assistance, online support groups, medication/co-pay assistance.   1-800-813-HOPE 5593658237) ? [place] Assists [place] cancer patients and their families through emotional , educational and financial support.  352-162-6310 ? [place] DSS Where to apply for food stamps, Medicaid and utility assistance. 319 693 2153 ? RCATS: Transportation to medical appointments. 340-749-8750 ? Social Security Administration: May apply for disability if have a Stage IV cancer. 608-150-9502 731-162-6145 ? [employer], Disability and Transit Services: Assists with nutrition, care and transit needs. [place] Support Programs: @10RELATIVEDAYS @ > Cancer Support Group  2nd Tuesday of the month 1pm-2pm, Journey Room  > Creative Journey  3rd Tuesday of the month 1130am-1pm, Journey Room  > Look Good Feel Better  1st Wednesday of the month 10am-12 noon, Journey Room (Call [place] to register [phone])

## 2016-06-08 NOTE — Progress Notes (Signed)
[name] Nakayama, MD [address], [address] [place] [address]    [facility] [place] NOTE   DIAGNOSIS: Hodgkin's disease   Staging form: Lymphoid Neoplasms, AJCC 6th Edition     Clinical: Stage III - Signed by Baird Cancer, PA on 09/14/2010 CLL CKD Stage IIIA Hypogammaglobulenemia  SUMMARY OF ONCOLOGIC HISTORY:   Hodgkin's disease ([place])   10/02/2007 Initial Diagnosis    HODGKIN'S DISEASE       Leukemia, lymphocytic, chronic ([place])   07/22/2015 Procedure    IR bone marrow aspiration and biopsy      07/23/2015 Pathology Results    Bone Marrow, Aspirate,Biopsy, and Clot, left iliac crest - EXTENSIVE INVOLVEMENT BY CHRONIC LYMPHOCYTIC LEUKEMIA. PERIPHERAL BLOOD: - CHRONIC LYMPHOCYTIC LEUKEMIA. - NORMOCYTIC ANEMIA. - THROMBOCYTOPENIA.      07/23/2015 Pathology Results    Bone Marrow Flow Cytometry - CHRONIC LYMPHOCYTIC LEUKEMIA      08/20/2015 Imaging    CT CAP- Mediastinal and hilar lymphadenopathy consistent with CLL. No pulmonary lesions. Marked splenomegaly. Mild upper abdominal retroperitoneal adenopathy.      08/27/2015 Miscellaneous    Imbruvica therapy is cost-prohibitive.      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      11/04/2015 Treatment Plan Change    Treatment deferred due to thrombocytopenia      02/03/2016 Treatment Plan Change    BR chemotherapy discontinued after 3 cycles of chemotherapy due to multiple delayed treatments secondary to chemotherapy-induced cytopenias.       CURRENT THERAPY: Observation  INTERVAL HISTORY: [name] 71 y.o. male returns for follow-up of CLL. He also has a history of Hodgkin Lymphoma. He has no major complaints today.  He denies night sweats, fever or chills. His appetite is fairly good. His  energy is baseline and unchanged.   He is doing well today. He is getting over a cold right now and has a sore throat. He has been having some sweats. His weight is stable. He has a red spot on his penis that hasn't been there before. He was sent to a urologist, who gave him a salve, which is helping. His urologist is concerned it could be cancerous and would like to order a biopsy. Denies fever, chills, or any other concerns.   MEDICAL HISTORY: Past Medical History:  Diagnosis Date  . Acid reflux   . Aortic stenosis   . Arthritis   . Atrial fibrillation ([place])   . BPH (benign prostatic hypertrophy)   . CLL (chronic lymphoblastic leukemia)   . Coronary atherosclerosis of native coronary artery   . Essential hypertension, benign   . Hodgkin's disease   . HOH (hard of hearing)   . Hyperlipidemia   . Hypogammaglobulinemia ([place])   . Indirect inguinal hernia 04/13/2013  . Kidney stones   . Pre-diabetes 08/04/2011  . Wears dentures   . Wears glasses     has Hodgkin's disease ([place]); Leukemia, lymphocytic, chronic ([place]); Mixed hyperlipidemia; Obesity; CORONARY ATHEROSCLEROSIS NATIVE CORONARY ARTERY; Atrial fibrillation ([place]); AORTIC VALVE REPLACEMENT, HX OF; Essential hypertension, benign; GERD (gastroesophageal reflux disease); Thrombocytopenia ([place]); Hypogammaglobulinemia, acquired ([place]); Anemia; Pre-diabetes; Dental caries; Piles (hemorrhoids); Reduced vision; Neck mass; Cholelithiasis; Indirect inguinal hernia; Chronic kidney disease (CKD); Annual physical exam;  Pigmented skin lesion; Carotid bruit; Recurrent falls while walking; Penile lesion; and Special screening for malignant neoplasms, colon on his problem list.     is allergic to azithromycin.  We administered heparin lock flush and sodium chloride.  SURGICAL HISTORY: Past Surgical History:  Procedure Laterality Date  . AORTIC VALVE REPLACEMENT  10/09   37mm Magna Pericardial   . BONE MARROW ASPIRATION    . CATARACT EXTRACTION  W/PHACO Right 01/07/2014   Procedure: CATARACT EXTRACTION PHACO AND INTRAOCULAR LENS PLACEMENT ([place]);  Surgeon: Tonny Branch, MD;  Location: [facility];  Service: Ophthalmology;  Laterality: Right;  CDE:15.31  . CATARACT EXTRACTION W/PHACO Left 03/25/2014   Procedure: CATARACT EXTRACTION PHACO AND INTRAOCULAR LENS PLACEMENT ([facility]);  Surgeon: Tonny Branch, MD;  Location: [facility];  Service: Ophthalmology;  Laterality: Left;  CDE 6.55  . COLON SURGERY    . CORONARY ARTERY BYPASS GRAFT  10/09   SVG to RCA  . DENTAL SURGERY  08/2012  . ESOPHAGOGASTRODUODENOSCOPY     scrapping of throat and stretching  . EYE SURGERY Right 02/07/2014   cataract  . LITHOTRIPSY    . MASS EXCISION N/A 12/05/2012   Procedure: EXCISION MIDLINE NECK MASS;  Surgeon: Ascencion Dike, MD;  Location: [facility];  Service: ENT;  Laterality: N/A;  . MULTIPLE TOOTH EXTRACTIONS    . PORTACATH PLACEMENT    . TONGUE BIOPSY  12/2012    SOCIAL HISTORY: Social History   Social History  . Marital status: Widowed    Spouse name: N/A  . Number of children: N/A  . Years of education: N/A   Occupational History  . Not on file.   Social History Main Topics  . Smoking status: Never Smoker  . Smokeless tobacco: Never Used     Comment: pt denies tobacco use   . Alcohol use No     Comment: pt denies alcohol   . Drug use: No  . Sexual activity: Not Currently   Other Topics Concern  . Not on file   Social History Narrative  . No narrative on file    FAMILY HISTORY: Family History  Problem Relation Age of Onset  . Heart failure Mother   . Blindness Mother   . Cancer Father 58    Lung   . Cancer Brother 102    acute leukemia    Review of Systems  Constitutional: Negative for chills, fever and weight loss.       Hot flashes Night sweats  HENT: Positive for congestion and sore throat.   Eyes: Negative.   Respiratory: Negative.   Cardiovascular: Negative.   Gastrointestinal: Negative.   Genitourinary: Negative.     Musculoskeletal: Negative.   Skin:       Red spot on penis  Neurological: Negative.   Endo/Heme/Allergies: Negative.   Psychiatric/Behavioral: Negative.   All other systems reviewed and are negative. 14 point review of systems was performed and is negative except as detailed under history of present illness and above  PHYSICAL EXAMINATION  ECOG PERFORMANCE STATUS: 0 - Asymptomatic  Vitals:   06/08/16 0941  BP: 119/63  Pulse: 83  Resp: 18  Temp: 97.5 F (36.4 C)   Physical Exam  Constitutional: He is oriented to person, place, and time and well-developed, well-nourished, and in no distress.  HENT:  Head: Normocephalic and atraumatic.  Mouth/Throat: Oropharynx is clear and moist.  Eyes: Conjunctivae and EOM are normal. Pupils are equal, round, and reactive to light.  Neck: Normal range  of motion. Neck supple.  Cardiovascular: Normal rate, regular rhythm and normal heart sounds.   Pulmonary/Chest: Effort normal and breath sounds normal.  Abdominal: Soft. Bowel sounds are normal.  Musculoskeletal: Normal range of motion.  Neurological: He is alert and oriented to person, place, and time. Gait normal.  Skin: Skin is warm and dry.  Nursing note and vitals reviewed.  LABORATORY DATA: I have reviewed the data as listed.  CBC    Component Value Date/Time   WBC 17.0 (H) 05/10/2016 0946   RBC 3.38 (L) 05/10/2016 0946   HGB 11.2 (L) 05/10/2016 0946   HCT 33.1 (L) 05/10/2016 0946   PLT 95 (L) 05/10/2016 0946   MCV 97.9 05/10/2016 0946   MCH 33.1 05/10/2016 0946   MCHC 33.8 05/10/2016 0946   RDW 15.1 05/10/2016 0946   LYMPHSABS 7.3 (H) 04/07/2016 0943   MONOABS 0.6 04/07/2016 0943   EOSABS 0.6 04/07/2016 0943   BASOSABS 0.0 04/07/2016 0943   CMP     Component Value Date/Time   NA 137 05/10/2016 0946   K 4.7 05/10/2016 0946   CL 105 05/10/2016 0946   CO2 25 05/10/2016 0946   GLUCOSE 122 (H) 05/10/2016 0946   BUN 23 (H) 05/10/2016 0946   CREATININE 1.86 (H)  05/10/2016 0946   CREATININE 1.59 (H) 06/10/2015 1601   CALCIUM 8.7 (L) 05/10/2016 0946   PROT 6.3 (L) 05/10/2016 0946   ALBUMIN 3.5 05/10/2016 0946   AST 26 05/10/2016 0946   ALT 16 (L) 05/10/2016 0946   ALKPHOS 83 05/10/2016 0946   BILITOT 0.5 05/10/2016 0946   GFRNONAA 35 (L) 05/10/2016 0946   GFRAA 40 (L) 05/10/2016 0946   Results for [name], [name] (MRN [id number]) as of 06/08/2016 08:41  Ref. Range 01/27/2016 08:40 02/03/2016 08:52 03/10/2016 09:35 04/07/2016 09:43 05/10/2016 09:46  Platelets Latest Ref Range: 150 - 400 K/uL 88 (L) 93 (L) 100 (L) 89 (L) 95 (L)   ASSESSMENT and THERAPY PLAN:  DIAGNOSIS: Hodgkin's disease   Staging form: Lymphoid Neoplasms, AJCC 6th Edition     Clinical: Stage III - Signed by Baird Cancer, PA on 09/14/2010 CLL CKD Stage IIIA Hypogammaglobulenemia  I encouraged him to proceed with the biopsy of the lesion on his penis. Continue application of salve BID as instructed by his urologist to the lesion.  IVIG q4 weeks, he will get a dose today.  Currently not getting any treatment for CLL, continue observation of his CBCs.   He will return for follow up in 8 weeks with CBC, CMP, immunoglobulins levels.   No orders of the defined types were placed in this encounter.  This document serves as a record of services personally performed by Twana First, MD. It was created on her behalf by Martinique Casey, a trained medical scribe. The creation of this record is based on the scribe's personal observations and the provider's statements to them. This document has been checked and approved by the attending provider.  I have reviewed the above documentation for accuracy and completeness, and I agree with the above.  This note was electronically signed.

## 2016-06-08 NOTE — Patient Instructions (Signed)
[place] at [facility] Discharge Instructions  RECOMMENDATIONS MADE BY THE CONSULTANT AND ANY TEST RESULTS WILL BE SENT TO YOUR REFERRING PHYSICIAN.  IVIG infusion today. Return as scheduled.   Thank you for choosing [place] at [facility] to provide your oncology and hematology care.  To afford each patient quality time with our provider, please arrive at least 15 minutes before your scheduled appointment time.    If you have a lab appointment with the [facility] please come in thru the  Main Entrance and check in at the main information desk  You need to re-schedule your appointment should you arrive 10 or more minutes late.  We strive to give you quality time with our providers, and arriving late affects you and other patients whose appointments are after yours.  Also, if you no show three or more times for appointments you may be dismissed from the clinic at the providers discretion.     Again, thank you for choosing [facility].  Our hope is that these requests will decrease the amount of time that you wait before being seen by our physicians.       _____________________________________________________________  Should you have questions after your visit to [facility], please contact our office at [phone] between the hours of 8:30 a.m. and 4:30 p.m.  Voicemails left after 4:30 p.m. will not be returned until the following business day.  For prescription refill requests, have your pharmacy contact our office.       Resources For Cancer Patients and their Caregivers ? American Cancer Society: Can assist with transportation, wigs, general needs, runs Look Good Feel Better.        (226)500-1357 ? Cancer Care: Provides financial assistance, online support groups, medication/co-pay assistance.  1-800-813-HOPE 657-266-5012) ? [facility] Assists [place] cancer patients and their  families through emotional , educational and financial support.  3195650585 ? [place] DSS Where to apply for food stamps, Medicaid and utility assistance. 803-367-5942 ? RCATS: Transportation to medical appointments. (848) 835-6218 ? Social Security Administration: May apply for disability if have a Stage IV cancer. 279-351-3188 5850513962 ? [employer], Disability and Transit Services: Assists with nutrition, care and transit needs. [place] Support Programs: @10RELATIVEDAYS @ > Cancer Support Group  2nd Tuesday of the month 1pm-2pm, Journey Room  > Creative Journey  3rd Tuesday of the month 1130am-1pm, Journey Room  > Look Good Feel Better  1st Wednesday of the month 10am-12 noon, Journey Room (Call [place] to register [phone])

## 2016-06-08 NOTE — Progress Notes (Signed)
Tolerated infusion w/o adverse reaction.  Alert, in no distress.  VSS.  Discharged ambulatory.  

## 2016-06-09 LAB — IGG, IGA, IGM
IGM, SERUM: 7 mg/dL — AB (ref 15–143)
IgA: <5 mg/dL — ABNORMAL LOW (ref 61–437)
IgG (Immunoglobin G), Serum: 1841 mg/dL — ABNORMAL HIGH (ref 700–1600)

## 2016-06-20 ENCOUNTER — Other Ambulatory Visit (HOSPITAL_COMMUNITY): Payer: Self-pay | Admitting: Oncology

## 2016-06-20 DIAGNOSIS — C9110 Chronic lymphocytic leukemia of B-cell type not having achieved remission: Secondary | ICD-10-CM

## 2016-06-23 ENCOUNTER — Other Ambulatory Visit (HOSPITAL_COMMUNITY): Payer: Self-pay | Admitting: *Deleted

## 2016-06-23 DIAGNOSIS — C9110 Chronic lymphocytic leukemia of B-cell type not having achieved remission: Secondary | ICD-10-CM

## 2016-07-06 ENCOUNTER — Encounter (HOSPITAL_COMMUNITY): Payer: Medicare Other | Attending: Oncology

## 2016-07-06 ENCOUNTER — Encounter (HOSPITAL_COMMUNITY): Payer: Self-pay

## 2016-07-06 VITALS — BP 121/63 | HR 81 | Temp 98.0°F | Resp 18 | Wt 212.4 lb

## 2016-07-06 DIAGNOSIS — C9110 Chronic lymphocytic leukemia of B-cell type not having achieved remission: Secondary | ICD-10-CM | POA: Insufficient documentation

## 2016-07-06 DIAGNOSIS — D801 Nonfamilial hypogammaglobulinemia: Secondary | ICD-10-CM | POA: Diagnosis not present

## 2016-07-06 DIAGNOSIS — C8190 Hodgkin lymphoma, unspecified, unspecified site: Secondary | ICD-10-CM | POA: Diagnosis not present

## 2016-07-06 DIAGNOSIS — Z23 Encounter for immunization: Secondary | ICD-10-CM | POA: Insufficient documentation

## 2016-07-06 LAB — CBC WITH DIFFERENTIAL/PLATELET
BASOS ABS: 0 10*3/uL (ref 0.0–0.1)
BLASTS: 0 %
Band Neutrophils: 0 %
Basophils Relative: 0 %
EOS PCT: 3 %
Eosinophils Absolute: 0.7 10*3/uL (ref 0.0–0.7)
HEMATOCRIT: 32.4 % — AB (ref 39.0–52.0)
HEMOGLOBIN: 10.7 g/dL — AB (ref 13.0–17.0)
LYMPHS ABS: 22.4 10*3/uL — AB (ref 0.7–4.0)
LYMPHS PCT: 90 %
MCH: 32.3 pg (ref 26.0–34.0)
MCHC: 33 g/dL (ref 30.0–36.0)
MCV: 97.9 fL (ref 78.0–100.0)
METAMYELOCYTES PCT: 0 %
MONOS PCT: 2 %
Monocytes Absolute: 0.5 10*3/uL (ref 0.1–1.0)
Myelocytes: 0 %
NEUTROS ABS: 1.2 10*3/uL — AB (ref 1.7–7.7)
NEUTROS PCT: 5 %
NRBC: 0 /100{WBCs}
Other: 0 %
Platelets: 76 10*3/uL — ABNORMAL LOW (ref 150–400)
Promyelocytes Absolute: 0 %
RBC: 3.31 MIL/uL — AB (ref 4.22–5.81)
RDW: 15.9 % — AB (ref 11.5–15.5)
WBC: 24.8 10*3/uL — AB (ref 4.0–10.5)

## 2016-07-06 LAB — COMPREHENSIVE METABOLIC PANEL
ALBUMIN: 3.4 g/dL — AB (ref 3.5–5.0)
ALK PHOS: 110 U/L (ref 38–126)
ALT: 23 U/L (ref 17–63)
AST: 35 U/L (ref 15–41)
Anion gap: 5 (ref 5–15)
BUN: 21 mg/dL — AB (ref 6–20)
CALCIUM: 8.6 mg/dL — AB (ref 8.9–10.3)
CO2: 26 mmol/L (ref 22–32)
Chloride: 105 mmol/L (ref 101–111)
Creatinine, Ser: 1.97 mg/dL — ABNORMAL HIGH (ref 0.61–1.24)
GFR calc Af Amer: 37 mL/min — ABNORMAL LOW (ref >60)
GFR, EST NON AFRICAN AMERICAN: 32 mL/min — AB (ref >60)
GLUCOSE: 128 mg/dL — AB (ref 65–99)
Potassium: 4.7 mmol/L (ref 3.5–5.1)
Sodium: 136 mmol/L (ref 135–145)
TOTAL PROTEIN: 6.5 g/dL (ref 6.5–8.1)
Total Bilirubin: 0.5 mg/dL (ref 0.3–1.2)

## 2016-07-06 LAB — LACTATE DEHYDROGENASE: LDH: 198 U/L — AB (ref 98–192)

## 2016-07-06 MED ORDER — IMMUNE GLOBULIN (HUMAN) 20 GM/200ML IV SOLN
0.4000 g/kg | Freq: Once | INTRAVENOUS | Status: AC
  Administered 2016-07-06: 40 g via INTRAVENOUS
  Filled 2016-07-06: qty 400

## 2016-07-06 MED ORDER — SODIUM CHLORIDE 0.9 % IV SOLN: Freq: Once | INTRAVENOUS | Status: DC

## 2016-07-06 MED ORDER — HEPARIN SOD (PORK) LOCK FLUSH 100 UNIT/ML IV SOLN
500.0000 [IU] | Freq: Once | INTRAVENOUS | Status: AC
  Administered 2016-07-06: 500 [IU] via INTRAVENOUS

## 2016-07-06 MED ORDER — DIPHENHYDRAMINE HCL 25 MG PO TABS
25.0000 mg | ORAL_TABLET | Freq: Once | ORAL | Status: AC
  Administered 2016-07-06: 25 mg via ORAL

## 2016-07-06 MED ORDER — ACETAMINOPHEN 325 MG PO TABS
ORAL_TABLET | ORAL | Status: AC
  Filled 2016-07-06: qty 2

## 2016-07-06 MED ORDER — HEPARIN SOD (PORK) LOCK FLUSH 100 UNIT/ML IV SOLN
INTRAVENOUS | Status: AC
  Filled 2016-07-06: qty 5

## 2016-07-06 MED ORDER — DIPHENHYDRAMINE HCL 25 MG PO CAPS
ORAL_CAPSULE | ORAL | Status: AC
  Filled 2016-07-06: qty 1

## 2016-07-06 MED ORDER — ACETAMINOPHEN 325 MG PO TABS
650.0000 mg | ORAL_TABLET | Freq: Four times a day (QID) | ORAL | Status: DC | PRN
  Administered 2016-07-06: 650 mg via ORAL

## 2016-07-06 MED ORDER — DEXTROSE 5 % IV SOLN
INTRAVENOUS | Status: DC
  Administered 2016-07-06: 10:00:00 via INTRAVENOUS

## 2016-07-06 NOTE — Progress Notes (Signed)
Patient tolerated infusion well.  VSS throughout.  Patient ambulatory and stable upon discharge from the clinic.  Instructed the patient to get follow up appointments from scheduler prior to leaving the clinic today.

## 2016-07-06 NOTE — Patient Instructions (Signed)
[place] at [facility] Discharge Instructions  RECOMMENDATIONS MADE BY THE CONSULTANT AND ANY TEST RESULTS WILL BE SENT TO YOUR REFERRING PHYSICIAN.  IVIG today  Thank you for choosing [place] at [facility] to provide your oncology and hematology care.  To afford each patient quality time with our provider, please arrive at least 15 minutes before your scheduled appointment time.    If you have a lab appointment with the [facility] please come in thru the  Main Entrance and check in at the main information desk  You need to re-schedule your appointment should you arrive 10 or more minutes late.  We strive to give you quality time with our providers, and arriving late affects you and other patients whose appointments are after yours.  Also, if you no show three or more times for appointments you may be dismissed from the clinic at the providers discretion.     Again, thank you for choosing [facility].  Our hope is that these requests will decrease the amount of time that you wait before being seen by our physicians.       _____________________________________________________________  Should you have questions after your visit to [facility], please contact our office at [phone] between the hours of 8:30 a.m. and 4:30 p.m.  Voicemails left after 4:30 p.m. will not be returned until the following business day.  For prescription refill requests, have your pharmacy contact our office.       Resources For Cancer Patients and their Caregivers ? American Cancer Society: Can assist with transportation, wigs, general needs, runs Look Good Feel Better.        (325)421-0839 ? Cancer Care: Provides financial assistance, online support groups, medication/co-pay assistance.  1-800-813-HOPE (706)052-9416) ? [place] Assists [place] cancer patients and their families through emotional ,  educational and financial support.  813-649-3709 ? [place] DSS Where to apply for food stamps, Medicaid and utility assistance. 6158491066 ? RCATS: Transportation to medical appointments. (209)122-3892 ? Social Security Administration: May apply for disability if have a Stage IV cancer. 709-843-9812 940-702-4750 ? [employer], Disability and Transit Services: Assists with nutrition, care and transit needs. [place] Support Programs: @10RELATIVEDAYS @ > Cancer Support Group  2nd Tuesday of the month 1pm-2pm, Journey Room  > Creative Journey  3rd Tuesday of the month 1130am-1pm, Journey Room  > Look Good Feel Better  1st Wednesday of the month 10am-12 noon, Journey Room (Call [place] to register [phone])

## 2016-07-07 LAB — IGG, IGA, IGM
IGG (IMMUNOGLOBIN G), SERUM: 825 mg/dL (ref 700–1600)
IgM, Serum: 7 mg/dL — ABNORMAL LOW (ref 15–143)

## 2016-07-14 ENCOUNTER — Telehealth: Payer: Self-pay | Admitting: Family Medicine

## 2016-07-14 NOTE — Telephone Encounter (Signed)
Patient has an appt w/[facility] but has  misplaced his order for blood work and would like to come by and pick up.  cb  [phone]

## 2016-07-15 NOTE — Telephone Encounter (Signed)
Message left for patient

## 2016-07-16 DIAGNOSIS — R7303 Prediabetes: Secondary | ICD-10-CM | POA: Diagnosis not present

## 2016-07-16 DIAGNOSIS — Z125 Encounter for screening for malignant neoplasm of prostate: Secondary | ICD-10-CM | POA: Diagnosis not present

## 2016-07-16 DIAGNOSIS — E782 Mixed hyperlipidemia: Secondary | ICD-10-CM | POA: Diagnosis not present

## 2016-07-16 DIAGNOSIS — E559 Vitamin D deficiency, unspecified: Secondary | ICD-10-CM | POA: Diagnosis not present

## 2016-07-16 DIAGNOSIS — I10 Essential (primary) hypertension: Secondary | ICD-10-CM | POA: Diagnosis not present

## 2016-07-16 LAB — TSH: TSH: 5.41 m[IU]/L — AB (ref 0.40–4.50)

## 2016-07-16 LAB — HEPATIC FUNCTION PANEL
ALBUMIN: 3.6 g/dL (ref 3.6–5.1)
ALK PHOS: 124 U/L — AB (ref 40–115)
ALT: 20 U/L (ref 9–46)
AST: 33 U/L (ref 10–35)
BILIRUBIN TOTAL: 0.4 mg/dL (ref 0.2–1.2)
Bilirubin, Direct: 0.1 mg/dL (ref <=0.2)
Indirect Bilirubin: 0.3 mg/dL (ref 0.2–1.2)
TOTAL PROTEIN: 6.5 g/dL (ref 6.1–8.1)

## 2016-07-16 LAB — COMPREHENSIVE METABOLIC PANEL
ALBUMIN: 3.6 g/dL (ref 3.6–5.1)
ALK PHOS: 124 U/L — AB (ref 40–115)
ALT: 20 U/L (ref 9–46)
AST: 33 U/L (ref 10–35)
BILIRUBIN TOTAL: 0.4 mg/dL (ref 0.2–1.2)
BUN: 21 mg/dL (ref 7–25)
CALCIUM: 8.9 mg/dL (ref 8.6–10.3)
CO2: 25 mmol/L (ref 20–31)
CREATININE: 2.1 mg/dL — AB (ref 0.70–1.18)
Chloride: 105 mmol/L (ref 98–110)
Glucose, Bld: 100 mg/dL — ABNORMAL HIGH (ref 65–99)
Potassium: 5.2 mmol/L (ref 3.5–5.3)
SODIUM: 138 mmol/L (ref 135–146)
TOTAL PROTEIN: 6.5 g/dL (ref 6.1–8.1)

## 2016-07-16 LAB — LIPID PANEL
CHOLESTEROL: 248 mg/dL — AB (ref <200)
HDL: 15 mg/dL — ABNORMAL LOW (ref >40)
LDL Cholesterol: 169 mg/dL — ABNORMAL HIGH (ref <100)
TRIGLYCERIDES: 318 mg/dL — AB (ref <150)
Total CHOL/HDL Ratio: 16.5 Ratio — ABNORMAL HIGH (ref <5.0)
VLDL: 64 mg/dL — ABNORMAL HIGH (ref <30)

## 2016-07-16 LAB — PSA: PSA: 0.1 ng/mL (ref <=4.0)

## 2016-07-17 LAB — VITAMIN D 25 HYDROXY (VIT D DEFICIENCY, FRACTURES): VIT D 25 HYDROXY: 25 ng/mL — AB (ref 30–100)

## 2016-07-21 ENCOUNTER — Encounter: Payer: Self-pay | Admitting: Family Medicine

## 2016-07-21 ENCOUNTER — Ambulatory Visit (INDEPENDENT_AMBULATORY_CARE_PROVIDER_SITE_OTHER): Payer: Medicare Other | Admitting: Family Medicine

## 2016-07-21 VITALS — BP 124/68 | HR 68 | Temp 96.7°F | Resp 16 | Ht 70.0 in | Wt 200.0 lb

## 2016-07-21 DIAGNOSIS — I2510 Atherosclerotic heart disease of native coronary artery without angina pectoris: Secondary | ICD-10-CM

## 2016-07-21 DIAGNOSIS — C9110 Chronic lymphocytic leukemia of B-cell type not having achieved remission: Secondary | ICD-10-CM

## 2016-07-21 DIAGNOSIS — E782 Mixed hyperlipidemia: Secondary | ICD-10-CM | POA: Diagnosis not present

## 2016-07-21 DIAGNOSIS — R7303 Prediabetes: Secondary | ICD-10-CM | POA: Diagnosis not present

## 2016-07-21 DIAGNOSIS — K8020 Calculus of gallbladder without cholecystitis without obstruction: Secondary | ICD-10-CM | POA: Diagnosis not present

## 2016-07-21 DIAGNOSIS — I10 Essential (primary) hypertension: Secondary | ICD-10-CM | POA: Diagnosis not present

## 2016-07-21 DIAGNOSIS — I4891 Unspecified atrial fibrillation: Secondary | ICD-10-CM

## 2016-07-21 DIAGNOSIS — C9190 Lymphoid leukemia, unspecified not having achieved remission: Secondary | ICD-10-CM

## 2016-07-21 NOTE — Patient Instructions (Addendum)
Annual wellness visit with [name] in 4 month  MD follow up visit in November  Fasting lipid, cmp and EGFr last week in October  PLEASE cut back on fried foods, butter and cheese  Cholesterol is way too high   Please work on good  health habits so that your health will improve. 1. Commitment to daily physical activity for 30 to 60  minutes, if you are able to do this.  2. Commitment to wise food choices. Aim for half of your  food intake to be vegetable and fruit, one quarter starchy foods, and one quarter protein. Try to eat on a regular schedule  3 meals per day, snacking between meals should be limited to vegetables or fruits or small portions of nuts. 64 ounces of water per day is generally recommended, unless you have specific health conditions, like heart failure or kidney failure where you will need to limit fluid intake.  3. Commitment to sufficient and a  good quality of physical and mental rest daily, generally between 6 to 8 hours per day.  WITH PERSISTANCE AND PERSEVERANCE, THE IMPOSSIBLE , BECOMES THE NORM!  Thank you  for choosing [facility]. We consider it a privelige to serve you.  Delivering excellent health care in a caring and  compassionate way is our goal.  Partnering with you,  so that together we can achieve this goal is our strategy.

## 2016-07-22 ENCOUNTER — Other Ambulatory Visit (HOSPITAL_COMMUNITY): Payer: Self-pay | Admitting: Oncology

## 2016-07-24 ENCOUNTER — Encounter: Payer: Self-pay | Admitting: Family Medicine

## 2016-07-24 NOTE — Assessment & Plan Note (Signed)
Improved Patient re-educated about  the importance of commitment to a  minimum of 150 minutes of exercise per week.  The importance of healthy food choices with portion control discussed. Encouraged to start a food diary, count calories and to consider  joining a support group. Sample diet sheets offered. Goals set by the patient for the next several months.   Weight /BMI 07/21/2016 07/06/2016 06/08/2016  WEIGHT 200 lb 212 lb 6.4 oz 213 lb 4.8 oz  HEIGHT 5\' 10"  - -  BMI 28.7 kg/m2 30.48 kg/m2 30.61 kg/m2

## 2016-07-24 NOTE — Assessment & Plan Note (Signed)
asymptomatic

## 2016-07-24 NOTE — Assessment & Plan Note (Signed)
Upcoming appt with cardiology in next 4 to 6 weeks, asymptomatic and clinically stable. Lipids extremely uncontrolled however, placing him at extremely high risk for recurrent disease

## 2016-07-24 NOTE — Assessment & Plan Note (Addendum)
Uncontrolled, refuses higher med dose, states will modify his diet Hyperlipidemia:Low fat diet discussed and encouraged.   Lipid Panel  Lab Results  Component Value Date   CHOL 248 (H) 07/16/2016   HDL 15 (L) 07/16/2016   LDLCALC 169 (H) 07/16/2016   TRIG 318 (H) 07/16/2016   CHOLHDL 16.5 (H) 07/16/2016

## 2016-07-24 NOTE — Progress Notes (Signed)
[name]     MRN: [id number]      DOB: 02-18-1946   HPI [name] is here for follow up and re-evaluation of chronic medical conditions, medication management and review of any available recent lab and radiology data.  Preventive health is updated, specifically  Cancer screening and Immunization.   Questions or concerns regarding consultations or procedures which the PT has had in the interim are  addressed. The PT denies any adverse reactions to current medications since the last visit.  There are no new concerns.  There are no specific complaints   ROS Denies recent fever or chills. Denies sinus pressure, nasal congestion, ear pain or sore throat. Denies chest congestion, productive cough or wheezing. Denies chest pains, palpitations and leg swelling Denies abdominal pain, nausea, vomiting,diarrhea or constipation.   Denies dysuria, frequency, hesitancy or incontinence. Denies joint pain, swelling and limitation in mobility. Denies headaches, seizures, numbness, or tingling. Denies depression, anxiety or insomnia. Denies skin break down or rash.   PE  BP 124/68 (BP Location: Left Arm, Patient Position: Sitting, Cuff Size: Large)   Pulse 68   Temp (!) 96.7 F (35.9 C) (Temporal)   Resp 16   Ht 5\' 10"  (1.778 m)   Wt 200 lb (90.7 kg)   SpO2 100%   BMI 28.70 kg/m   Patient alert and oriented and in no cardiopulmonary distress.  HEENT: No facial asymmetry, EOMI,   oropharynx pink and moist.  Neck supple no JVD, no mass.  Chest: Clear to auscultation bilaterally.  CVS: S1, S2 systolic murmur, no S3.Regular rate.  ABD: Soft non tender.   Ext: No edema  MS: Adequate ROM spine, shoulders, hips and knees.  Skin: Intact, no ulcerations or rash noted.  Psych: Good eye contact, normal affect. Memory intact not anxious or depressed appearing.  CNS: CN 2-12 intact, power,  normal throughout.no focal deficits noted.   Assessment & Plan  Essential hypertension,  benign Controlled, no change in medication DASH diet and commitment to daily physical activity for a minimum of 30 minutes discussed and encouraged, as a part of hypertension management. The importance of attaining a healthy weight is also discussed.  BP/Weight 07/21/2016 07/06/2016 06/08/2016 06/08/2016 05/10/2016 04/07/2016 40/98/1191  Systolic BP 478 295 621 308 657 846 962  Diastolic BP 68 63 56 63 50 55 70  Wt. (Lbs) 200 212.4 - 213.3 211.4 210.4 208  BMI 28.7 30.48 - 30.61 30.33 30.19 29.84       Mixed hyperlipidemia Uncontrolled, refuses higher med dose, states will modify his diet Hyperlipidemia:Low fat diet discussed and encouraged.   Lipid Panel  Lab Results  Component Value Date   CHOL 248 (H) 07/16/2016   HDL 15 (L) 07/16/2016   LDLCALC 169 (H) 07/16/2016   TRIG 318 (H) 07/16/2016   CHOLHDL 16.5 (H) 07/16/2016       Obesity Improved Patient re-educated about  the importance of commitment to a  minimum of 150 minutes of exercise per week.  The importance of healthy food choices with portion control discussed. Encouraged to start a food diary, count calories and to consider  joining a support group. Sample diet sheets offered. Goals set by the patient for the next several months.   Weight /BMI 07/21/2016 07/06/2016 06/08/2016  WEIGHT 200 lb 212 lb 6.4 oz 213 lb 4.8 oz  HEIGHT 5\' 10"  - -  BMI 28.7 kg/m2 30.48 kg/m2 30.61 kg/m2      Cholelithiasis asymptomatic  Atrial fibrillation Rate  controlled  Leukemia, lymphocytic, chronic ([place]) Followed closely by oncology, seen last in 05/2016  CORONARY ATHEROSCLEROSIS NATIVE CORONARY ARTERY Upcoming appt with cardiology in next 4 to 6 weeks, asymptomatic and clinically stable. Lipids extremely uncontrolled however, placing him at extremely high risk for recurrent disease

## 2016-07-24 NOTE — Assessment & Plan Note (Signed)
Followed closely by oncology, seen last in 05/2016

## 2016-07-24 NOTE — Assessment & Plan Note (Signed)
Rate controlled 

## 2016-07-24 NOTE — Assessment & Plan Note (Signed)
Controlled, no change in medication DASH diet and commitment to daily physical activity for a minimum of 30 minutes discussed and encouraged, as a part of hypertension management. The importance of attaining a healthy weight is also discussed.  BP/Weight 07/21/2016 07/06/2016 06/08/2016 06/08/2016 05/10/2016 04/07/2016 40/34/7425  Systolic BP 956 387 564 332 951 884 166  Diastolic BP 68 63 56 63 50 55 70  Wt. (Lbs) 200 212.4 - 213.3 211.4 210.4 208  BMI 28.7 30.48 - 30.61 30.33 30.19 29.84

## 2016-08-03 ENCOUNTER — Ambulatory Visit (HOSPITAL_COMMUNITY): Payer: Medicare Other | Admitting: Oncology

## 2016-08-03 ENCOUNTER — Encounter (HOSPITAL_COMMUNITY): Payer: Self-pay | Admitting: Adult Health

## 2016-08-03 ENCOUNTER — Encounter (HOSPITAL_BASED_OUTPATIENT_CLINIC_OR_DEPARTMENT_OTHER): Payer: Medicare Other | Admitting: Adult Health

## 2016-08-03 ENCOUNTER — Encounter (HOSPITAL_COMMUNITY): Payer: Medicare Other | Attending: Oncology

## 2016-08-03 VITALS — BP 82/40 | HR 69 | Temp 98.2°F | Resp 18 | Ht 70.0 in | Wt 210.5 lb

## 2016-08-03 VITALS — BP 102/64 | HR 53 | Temp 97.6°F | Resp 18

## 2016-08-03 DIAGNOSIS — D801 Nonfamilial hypogammaglobulinemia: Secondary | ICD-10-CM

## 2016-08-03 DIAGNOSIS — C8190 Hodgkin lymphoma, unspecified, unspecified site: Secondary | ICD-10-CM | POA: Insufficient documentation

## 2016-08-03 DIAGNOSIS — I959 Hypotension, unspecified: Secondary | ICD-10-CM | POA: Diagnosis not present

## 2016-08-03 DIAGNOSIS — C9110 Chronic lymphocytic leukemia of B-cell type not having achieved remission: Secondary | ICD-10-CM | POA: Insufficient documentation

## 2016-08-03 DIAGNOSIS — N485 Ulcer of penis: Secondary | ICD-10-CM

## 2016-08-03 DIAGNOSIS — Z23 Encounter for immunization: Secondary | ICD-10-CM | POA: Insufficient documentation

## 2016-08-03 LAB — COMPREHENSIVE METABOLIC PANEL
ALBUMIN: 3.1 g/dL — AB (ref 3.5–5.0)
ALK PHOS: 183 U/L — AB (ref 38–126)
ALT: 38 U/L (ref 17–63)
ANION GAP: 8 (ref 5–15)
AST: 60 U/L — AB (ref 15–41)
BUN: 25 mg/dL — ABNORMAL HIGH (ref 6–20)
CALCIUM: 8.4 mg/dL — AB (ref 8.9–10.3)
CO2: 22 mmol/L (ref 22–32)
Chloride: 106 mmol/L (ref 101–111)
Creatinine, Ser: 2.27 mg/dL — ABNORMAL HIGH (ref 0.61–1.24)
GFR calc Af Amer: 31 mL/min — ABNORMAL LOW (ref >60)
GFR calc non Af Amer: 27 mL/min — ABNORMAL LOW (ref >60)
GLUCOSE: 125 mg/dL — AB (ref 65–99)
POTASSIUM: 4.7 mmol/L (ref 3.5–5.1)
SODIUM: 136 mmol/L (ref 135–145)
Total Bilirubin: 0.4 mg/dL (ref 0.3–1.2)
Total Protein: 6 g/dL — ABNORMAL LOW (ref 6.5–8.1)

## 2016-08-03 LAB — CBC WITH DIFFERENTIAL/PLATELET
BAND NEUTROPHILS: 0 %
BASOS ABS: 0 10*3/uL (ref 0.0–0.1)
BASOS PCT: 0 %
Blasts: 0 %
EOS ABS: 1.8 10*3/uL — AB (ref 0.0–0.7)
EOS PCT: 4 %
HCT: 31.5 % — ABNORMAL LOW (ref 39.0–52.0)
HEMOGLOBIN: 10.5 g/dL — AB (ref 13.0–17.0)
Lymphocytes Relative: 94 %
Lymphs Abs: 41.5 10*3/uL — ABNORMAL HIGH (ref 0.7–4.0)
MCH: 32.8 pg (ref 26.0–34.0)
MCHC: 33.3 g/dL (ref 30.0–36.0)
MCV: 98.4 fL (ref 78.0–100.0)
METAMYELOCYTES PCT: 0 %
MONO ABS: 0.9 10*3/uL (ref 0.1–1.0)
MYELOCYTES: 0 %
Monocytes Relative: 2 %
NEUTROS PCT: 0 %
NRBC: 0 /100{WBCs}
Neutro Abs: 0 10*3/uL — ABNORMAL LOW (ref 1.7–7.7)
Platelets: 76 10*3/uL — ABNORMAL LOW (ref 150–400)
Promyelocytes Absolute: 0 %
RBC: 3.2 MIL/uL — ABNORMAL LOW (ref 4.22–5.81)
RDW: 16.7 % — ABNORMAL HIGH (ref 11.5–15.5)
WBC: 44.2 10*3/uL — ABNORMAL HIGH (ref 4.0–10.5)

## 2016-08-03 LAB — LACTATE DEHYDROGENASE: LDH: 220 U/L — ABNORMAL HIGH (ref 98–192)

## 2016-08-03 MED ORDER — IMMUNE GLOBULIN (HUMAN) 20 GM/200ML IV SOLN
0.4000 g/kg | Freq: Once | INTRAVENOUS | Status: AC
  Administered 2016-08-03: 40 g via INTRAVENOUS
  Filled 2016-08-03: qty 400

## 2016-08-03 MED ORDER — DEXTROSE 5 % IV SOLN
INTRAVENOUS | Status: DC
  Administered 2016-08-03: 10:00:00 via INTRAVENOUS

## 2016-08-03 MED ORDER — SODIUM CHLORIDE 0.9% FLUSH
10.0000 mL | INTRAVENOUS | Status: DC | PRN
Start: 2016-08-03 — End: 2016-08-03
  Administered 2016-08-03: 10 mL via INTRAVENOUS
  Filled 2016-08-03: qty 10

## 2016-08-03 MED ORDER — ACETAMINOPHEN 325 MG PO TABS
650.0000 mg | ORAL_TABLET | Freq: Four times a day (QID) | ORAL | Status: DC | PRN
  Administered 2016-08-03: 650 mg via ORAL
  Filled 2016-08-03: qty 2

## 2016-08-03 MED ORDER — DIPHENHYDRAMINE HCL 25 MG PO CAPS
25.0000 mg | ORAL_CAPSULE | Freq: Once | ORAL | Status: AC
  Administered 2016-08-03: 25 mg via ORAL
  Filled 2016-08-03: qty 1

## 2016-08-03 MED ORDER — HEPARIN SOD (PORK) LOCK FLUSH 100 UNIT/ML IV SOLN
INTRAVENOUS | Status: AC
  Filled 2016-08-03: qty 5

## 2016-08-03 MED ORDER — SODIUM CHLORIDE 0.9 % IV SOLN
INTRAVENOUS | Status: DC
  Administered 2016-08-03: 12:00:00 via INTRAVENOUS

## 2016-08-03 MED ORDER — HEPARIN SOD (PORK) LOCK FLUSH 100 UNIT/ML IV SOLN
500.0000 [IU] | Freq: Once | INTRAVENOUS | Status: AC
  Administered 2016-08-03: 500 [IU] via INTRAVENOUS

## 2016-08-03 NOTE — Patient Instructions (Signed)
[place] at [facility] Discharge Instructions  RECOMMENDATIONS MADE BY THE CONSULTANT AND ANY TEST RESULTS WILL BE SENT TO YOUR REFERRING PHYSICIAN.  Received IVIG infusion as well as hydration today. Follow-up as scheduled. Call clinic for any questions or concerns  Thank you for choosing [place] at [facility] to provide your oncology and hematology care.  To afford each patient quality time with our provider, please arrive at least 15 minutes before your scheduled appointment time.    If you have a lab appointment with the [facility] please come in thru the  Main Entrance and check in at the main information desk  You need to re-schedule your appointment should you arrive 10 or more minutes late.  We strive to give you quality time with our providers, and arriving late affects you and other patients whose appointments are after yours.  Also, if you no show three or more times for appointments you may be dismissed from the clinic at the providers discretion.     Again, thank you for choosing [facility].  Our hope is that these requests will decrease the amount of time that you wait before being seen by our physicians.       _____________________________________________________________  Should you have questions after your visit to [facility], please contact our office at [phone] between the hours of 8:30 a.m. and 4:30 p.m.  Voicemails left after 4:30 p.m. will not be returned until the following business day.  For prescription refill requests, have your pharmacy contact our office.       Resources For Cancer Patients and their Caregivers ? American Cancer Society: Can assist with transportation, wigs, general needs, runs Look Good Feel Better.        402-393-2989 ? Cancer Care: Provides financial assistance, online support groups, medication/co-pay assistance.  1-800-813-HOPE (224)110-4345) ? [place] Assists [place] cancer patients and their families through emotional , educational and financial support.  [phone] ? [place] DSS Where to apply for food stamps, Medicaid and utility assistance. (279)103-3501 ? RCATS: Transportation to medical appointments. 8597064757 ? Social Security Administration: May apply for disability if have a Stage IV cancer. ([phone] 220-371-1319 ? [employer], Disability and Transit Services: Assists with nutrition, care and transit needs. [place] Support Programs: @10RELATIVEDAYS @ > Cancer Support Group  2nd Tuesday of the month 1pm-2pm, Journey Room  > Creative Journey  3rd Tuesday of the month 1130am-1pm, Journey Room  > Look Good Feel Better  1st Wednesday of the month 10am-12 noon, Journey Room (Call [place] to register [phone])

## 2016-08-03 NOTE — Progress Notes (Signed)
[name] [name] [name], [place] [address]   CLINIC:  Medical Oncology/Hematology  PCP:  [name] [name] Helper, MD [address] [place] [address] [phone]   REASON FOR VISIT:  Follow-up for CLL AND hypogammaglobulinemia   CURRENT THERAPY: Observation AND IVIG monthly    BRIEF ONCOLOGIC HISTORY:    Hodgkin's disease ([place])   10/02/2007 Initial Diagnosis    HODGKIN'S DISEASE       Leukemia, lymphocytic, chronic ([place])   07/22/2015 Procedure    IR bone marrow aspiration and biopsy      07/23/2015 Pathology Results    Bone Marrow, Aspirate,Biopsy, and Clot, left iliac crest - EXTENSIVE INVOLVEMENT BY CHRONIC LYMPHOCYTIC LEUKEMIA. PERIPHERAL BLOOD: - CHRONIC LYMPHOCYTIC LEUKEMIA. - NORMOCYTIC ANEMIA. - THROMBOCYTOPENIA.      07/23/2015 Pathology Results    Bone Marrow Flow Cytometry - CHRONIC LYMPHOCYTIC LEUKEMIA      08/20/2015 Imaging    CT CAP- Mediastinal and hilar lymphadenopathy consistent with CLL. No pulmonary lesions. Marked splenomegaly. Mild upper abdominal retroperitoneal adenopathy.      08/27/2015 Miscellaneous    Imbruvica therapy is cost-prohibitive.      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      11/04/2015 Treatment Plan Change    Treatment deferred due to thrombocytopenia      02/03/2016 Treatment Plan Change    BR chemotherapy discontinued after 3 cycles of chemotherapy due to multiple delayed treatments secondary to chemotherapy-induced cytopenias.        INTERVAL HISTORY:  [name] [name] [name] 71 y.o. male returns for routine follow-up for chronic lymphocytic leukemia and hypogammaglobulinemia.    He is seen today in treatment area, awaiting next dose of IVIG.  Reports that he has fatigue, with energy levels at 50%. Also reports that  his appetite is not great, also at 50%. He is not sure if he has had fevers; does report night sweats with chills that come and go.  He has periodic cough that he thinks are his sinuses/allergies. He has had sore throat x 1 week with congestion. Denies shortness of breath or chest pain.   Endorses feeling dizzy and falling over when he bends over. This has been going on for awhile, but worse since Friday night,  07/30/16. When this happens, his son has to help him stand up again. States that he has had some stiffness in his shoulders and neck since these recent falling episodes. Denies hitting his head or syncope.    He reported a penile lesion at last visit; [name] [name] [name] recommended he follow-up with his doctor for biopsy. He tells me "I haven't been able to follow-up with the doctor yet, but I will. They want to do a biopsy and I guess I need one. I'm still putting that cream on it too."   Overall, he feels very tired and weak.      REVIEW OF SYSTEMS:  Review of Systems  Constitutional: Positive for chills, diaphoresis and fatigue.  HENT:   Positive for sore throat. Negative for lump/mass and nosebleeds.   Eyes: Negative.   Respiratory: Positive for cough. Negative for shortness of breath.   Cardiovascular: Negative.  Negative for chest pain and leg swelling.  Gastrointestinal: Positive for diarrhea ("comes and goes" ). Negative for  abdominal pain.  Endocrine: Negative.   Genitourinary: Negative for dysuria and hematuria.        Lesion on penis   Musculoskeletal: Positive for arthralgias, myalgias and neck pain.  Neurological: Positive for dizziness (with bending over) and numbness (to feet; "no worse" ).  Hematological: Negative.   Psychiatric/Behavioral: Negative.      PAST MEDICAL/SURGICAL HISTORY:  Past Medical History:  Diagnosis Date  . Acid reflux   . Aortic stenosis   . Arthritis   . Atrial fibrillation ([place])   . BPH (benign prostatic hypertrophy)   . CLL (chronic  lymphoblastic leukemia)   . Coronary atherosclerosis of native coronary artery   . Essential hypertension, benign   . Hodgkin's disease   . HOH (hard of hearing)   . Hyperlipidemia   . Hypogammaglobulinemia ([place])   . Indirect inguinal hernia 04/13/2013  . Kidney stones   . Pre-diabetes 08/04/2011  . Wears dentures   . Wears glasses    Past Surgical History:  Procedure Laterality Date  . AORTIC VALVE REPLACEMENT  10/09   59m Magna Pericardial   . BONE MARROW ASPIRATION    . CATARACT EXTRACTION W/PHACO Right 01/07/2014   Procedure: CATARACT EXTRACTION PHACO AND INTRAOCULAR LENS PLACEMENT ([place];  Surgeon: KTonny Branch MD;  Location: [facility];  Service: Ophthalmology;  Laterality: Right;  CDE:15.31  . CATARACT EXTRACTION W/PHACO Left 03/25/2014   Procedure: CATARACT EXTRACTION PHACO AND INTRAOCULAR LENS PLACEMENT ([facility]);  Surgeon: KTonny Branch MD;  Location: [facility];  Service: Ophthalmology;  Laterality: Left;  CDE 6.55  . COLON SURGERY    . CORONARY ARTERY BYPASS GRAFT  10/09   SVG to RCA  . DENTAL SURGERY  08/2012  . ESOPHAGOGASTRODUODENOSCOPY     scrapping of throat and stretching  . EYE SURGERY Right 02/07/2014   cataract  . LITHOTRIPSY    . MASS EXCISION N/A 12/05/2012   Procedure: EXCISION MIDLINE NECK MASS;  Surgeon: SAscencion Dike MD;  [place]  Service: ENT;  Laterality: N/A;  . MULTIPLE TOOTH EXTRACTIONS    . PORTACATH PLACEMENT    . TONGUE BIOPSY  12/2012     SOCIAL HISTORY:  Social History   Social History  . Marital status: Widowed    Spouse name: N/A  . Number of children: N/A  . Years of education: N/A   Occupational History  . Not on file.   Social History Main Topics  . Smoking status: Never Smoker  . Smokeless tobacco: Never Used     Comment: pt denies tobacco use   . Alcohol use No     Comment: pt denies alcohol   . Drug use: No  . Sexual activity: Not Currently   Other Topics Concern  . Not on file   Social History Narrative   . No narrative on file    FAMILY HISTORY:  Family History  Problem Relation Age of Onset  . Heart failure Mother   . Blindness Mother   . Cancer Father 561   Lung   . Cancer Brother [phone]   acute leukemia     CURRENT MEDICATIONS:  Outpatient Encounter Prescriptions as of 08/03/2016  Medication Sig  . acetaminophen (TYLENOL) 500 MG tablet Take 1,000 mg by mouth every 6 (six) hours as needed for fever.   .Marland Kitchenacyclovir (ZOVIRAX) 200 MG capsule TAKE ONE CAPSULE BY MOUTH TWICE DAILY  . aspirin EC 81 MG tablet Take 81 mg by mouth daily.  . folic acid (  FOLVITE) 1 MG tablet TAKE ONE TABLET BY MOUTH ONCE DAILY  . metoprolol (LOPRESSOR) 50 MG tablet TAKE ONE TABLET BY MOUTH TWICE DAILY  . Multiple Vitamins-Minerals (CENTRUM SILVER ULTRA MENS PO) Take 1 tablet by mouth every morning.   Marland Kitchen OMEGA 3 1000 MG CAPS Take 1,000 mg by mouth daily.   Marland Kitchen omeprazole (PRILOSEC) 20 MG capsule Take 20 mg by mouth every morning.   . simvastatin (ZOCOR) 40 MG tablet Take 1 tablet (40 mg total) by mouth at bedtime.  . sulfamethoxazole-trimethoprim (BACTRIM DS,SEPTRA DS) 800-160 MG tablet TAKE ONE TABLET BY MOUTH THREE TIMES A WEEK  . clotrimazole-betamethasone (LOTRISONE) cream    Facility-Administered Encounter Medications as of 08/03/2016  Medication  . HYDROcodone-homatropine (HYCODAN) 5-1.5 MG/5ML syrup 5 mL  . potassium chloride SA (K-DUR,KLOR-CON) CR tablet 40 mEq    ALLERGIES:  Allergies  Allergen Reactions  . Azithromycin     Sob      PHYSICAL EXAM:  ECOG Performance status: 2 - Symptomatic; requires assistance   Vitals:   08/03/16 0920  BP: (!) 82/40  Pulse: 69  Resp: 18  Temp: 98.2 F (36.8 C)   Filed Weights   08/03/16 0920  Weight: 210 lb 8 oz (95.5 kg)    Physical Exam  Constitutional: He is oriented to person, place, and time.  Chronically-ill appearing male in no acute distress   HENT:  Head: Normocephalic.  Mouth/Throat: Oropharynx is clear and moist.  Eyes: Conjunctivae  are normal. No scleral icterus.  Neck: Normal range of motion. Neck supple.  Cardiovascular: Normal rate and regular rhythm.   Pulmonary/Chest: Effort normal and breath sounds normal. No respiratory distress. He has no wheezes. He has no rales.  Abdominal: Soft. Bowel sounds are normal. There is no tenderness. There is no rebound and no guarding.  Musculoskeletal: He exhibits no edema.  Mildly decreased neck/shoulder ROM.   Lymphadenopathy:    He has no cervical adenopathy.  Neurological: He is alert and oriented to person, place, and time.  Skin: Skin is warm and dry. There is pallor.  Psychiatric:  Slow to respond; pressured speech at times   Nursing note and vitals reviewed.    LABORATORY DATA:  I have reviewed the labs as listed.  CBC    Component Value Date/Time   WBC 44.2 (H) 08/03/2016 0916   RBC 3.20 (L) 08/03/2016 0916   HGB 10.5 (L) 08/03/2016 0916   HCT 31.5 (L) 08/03/2016 0916   PLT 76 (L) 08/03/2016 0916   MCV 98.4 08/03/2016 0916   MCH 32.8 08/03/2016 0916   MCHC 33.3 08/03/2016 0916   RDW 16.7 (H) 08/03/2016 0916   LYMPHSABS 41.5 (H) 08/03/2016 0916   MONOABS 0.9 08/03/2016 0916   EOSABS 1.8 (H) 08/03/2016 0916   BASOSABS 0.0 08/03/2016 0916   CMP Latest Ref Rng & Units 08/03/2016 07/16/2016 07/16/2016  Glucose 65 - 99 mg/dL 125(H) - 100(H)  BUN 6 - 20 mg/dL 25(H) - 21  Creatinine 0.61 - 1.24 mg/dL 2.27(H) - 2.10(H)  Sodium 135 - 145 mmol/L 136 - 138  Potassium 3.5 - 5.1 mmol/L 4.7 - 5.2  Chloride 101 - 111 mmol/L 106 - 105  CO2 22 - 32 mmol/L 22 - 25  Calcium 8.9 - 10.3 mg/dL 8.4(L) - 8.9  Total Protein 6.5 - 8.1 g/dL 6.0(L) 6.5 6.5  Total Bilirubin 0.3 - 1.2 mg/dL 0.4 0.4 0.4  Alkaline Phos 38 - 126 U/L 183(H) 124(H) 124(H)  AST 15 - 41 U/L 60(H) 33  33  ALT 17 - 63 U/L 38 20 20   Results for LEVEON, PELZER (MRN [id number])   Ref. Range 08/03/2016 09:16  LDH Latest Ref Range: 98 - 192 U/L 220 (H)   PENDING LABS:    DIAGNOSTIC IMAGING:  CT  chest/abd/pelvis: 08/20/15       PATHOLOGY:  Bone marrow biopsy: 07/22/15      ASSESSMENT & PLAN:   CLL:  -Diagnosed in 06/2015; initially treated with Bendamustine/Rituxan x 3 cycles from 09/08/15-12/09/15. Subsequent cycles of chemotherapy discontinued d/t chemo-induced cytopenias.  He has been on observation alone since that time.  -WBCs markedly elevated from previous today at 44.2 (up from 24.8 in 06/2016). Hemoglobin and platelets remain low, but stable.  Clinically, he does report fatigue and night sweats. Weight is stable; he has actually gained about 10 lbs in past 2 week-no evidence of volume overload on exam to suggest this weight gain is fluid).  Patient is poor historian and it is difficult to ascertain if he has had any fevers. No adenopathy on exam today.   -While we may be close to starting treatment, I will defer introducing any treatment for the CLL at this time given his current postural hypotension issues.  We will certainly continue to monitor.  -Will add-on CBC with diff, CMET, & LDH to monthly labs, for when he comes for IVIG infusions.   -Return to cancer center for follow-up visit in 2 months.  If progressive anemia or thrombocytopenia at that time, then would consider starting treatment for CLL at that time.    NCCN guidelines for indication for treatment of CLL are:  A. Eligible for clinical trial  B. Significant disease-related symptoms   1. Fatigue (severe)   2. Night sweats   3. Weight loss   4. Fever without infection  C. Threatened end-organ function  D. Progressive bulky disease (spleen >6cm below costal margin, lymph nodes >10 cm)  E. Progressive anemia  F. Progressive thrombocytopenia.    Hypogammaglobulinemia:  -Has required monthly IVIG since 09/2015 for recurrent URI. Last dose 07/06/16.  Oncology Flowsheet 07/06/2016  Immune Globulin 10% (PRIVIGEN) IV 0.4 g/kg  -Due for IVIG today; immunoglobulins pending.  -Continue monthly IVIG.     Hypotension:  -BP 80s/40s today. Given his postural symptoms with dizziness, IVF with normal saline x 1 L given today over 3 hours. BP improved to 100s/60s prior to discharge from cancer center today.  -Recommended he hold his anti-hypertensive medication (Metoprolol) until he sees his cardiologist, Dr. Domenic Polite again.  I will also send Dr. Domenic Polite an [facility] message in EPIC to make him aware of the patient's condition. Encouraged patient to contact Dr. Myles Gip office to see if he can be seen sooner, if they deem necessary.    Penile lesion:  -Educated him that ulcerations that do not heal are concerning for cancer and recommended that he follow-up with specialist for biopsy, as soon as he is able. He voiced understanding and will work on getting this appt scheduled.      Dispo:  -Return monthly for IVIG with labs (CBC with diff, CMET, LDH, & IgG/IgM/IgA) -Follow-up visit in 2 months.    All questions were answered to patient's stated satisfaction. Encouraged patient to call with any new concerns or questions before his next visit to the cancer center and we can certain see him sooner, if needed.    Plan of care discussed with [name] [name] [name], who agrees with the above aforementioned.    Orders placed  this encounter:  Orders Placed This Encounter  Procedures  . CBC with Differential/Platelet  . Comprehensive metabolic panel  . Lactate dehydrogenase  . IgG, IgA, IgM      [name] Craze, NP [place] [phone]

## 2016-08-03 NOTE — Progress Notes (Signed)
[name] tolerated IVIG infusion and hydration well without complaints or incident. Labs reviewed with [name] Craze NP. VSS with B/P improved upon discharge. Pt discharged self ambulatory in satisfactory condition

## 2016-08-03 NOTE — Patient Instructions (Addendum)
[place] at [facility] Discharge Instructions  RECOMMENDATIONS MADE BY THE CONSULTANT AND ANY TEST RESULTS WILL BE SENT TO YOUR REFERRING PHYSICIAN.  You were seen today by [name] Craze NP. DO NOT TAKE the Metoprolol until you see your cardiologist. Please call their office and see if you can be seen sooner than a month.    Thank you for choosing [place] at [facility] to provide your oncology and hematology care.  To afford each patient quality time with our provider, please arrive at least 15 minutes before your scheduled appointment time.    If you have a lab appointment with the [place] please come in thru the  Main Entrance and check in at the main information desk  You need to re-schedule your appointment should you arrive 10 or more minutes late.  We strive to give you quality time with our providers, and arriving late affects you and other patients whose appointments are after yours.  Also, if you no show three or more times for appointments you may be dismissed from the clinic at the providers discretion.     Again, thank you for choosing [facility].  Our hope is that these requests will decrease the amount of time that you wait before being seen by our physicians.       _____________________________________________________________  Should you have questions after your visit to [facility], please contact our office at [phone] between the hours of 8:30 a.m. and 4:30 p.m.  Voicemails left after 4:30 p.m. will not be returned until the following business day.  For prescription refill requests, have your pharmacy contact our office.       Resources For Cancer Patients and their Caregivers ? American Cancer Society: Can assist with transportation, wigs, general needs, runs Look Good Feel Better.        604-098-5551 ? Cancer Care: Provides financial assistance, online support groups,  medication/co-pay assistance.  1-800-813-HOPE 609-014-0376) ? [place] Assists [place] cancer patients and their families through emotional , educational and financial support.  3135115107 ? [place] DSS Where to apply for food stamps, Medicaid and utility assistance. 515-049-2088 ? RCATS: Transportation to medical appointments. 7707726176 ? Social Security Administration: May apply for disability if have a Stage IV cancer. 863-183-6769 [phone] ? [employer], Disability and Transit Services: Assists with nutrition, care and transit needs. [place] Support Programs: @10RELATIVEDAYS @ > Cancer Support Group  2nd Tuesday of the month 1pm-2pm, Journey Room  > Creative Journey  3rd Tuesday of the month 1130am-1pm, Journey Room  > Look Good Feel Better  1st Wednesday of the month 10am-12 noon, Journey Room (Call [place] to register [phone])

## 2016-08-04 LAB — IGG, IGA, IGM
IGM, SERUM: 7 mg/dL — AB (ref 15–143)
IgG (Immunoglobin G), Serum: 812 mg/dL (ref 700–1600)

## 2016-08-10 IMAGING — DX DG CHEST 1V
1 series · 1 of 1 positions shown · non-contrast
Comparison: 08/20/2015

CLINICAL DATA: Productive cough, congestion for 1 week

EXAM:
CHEST 1 VIEW

[chest pa]
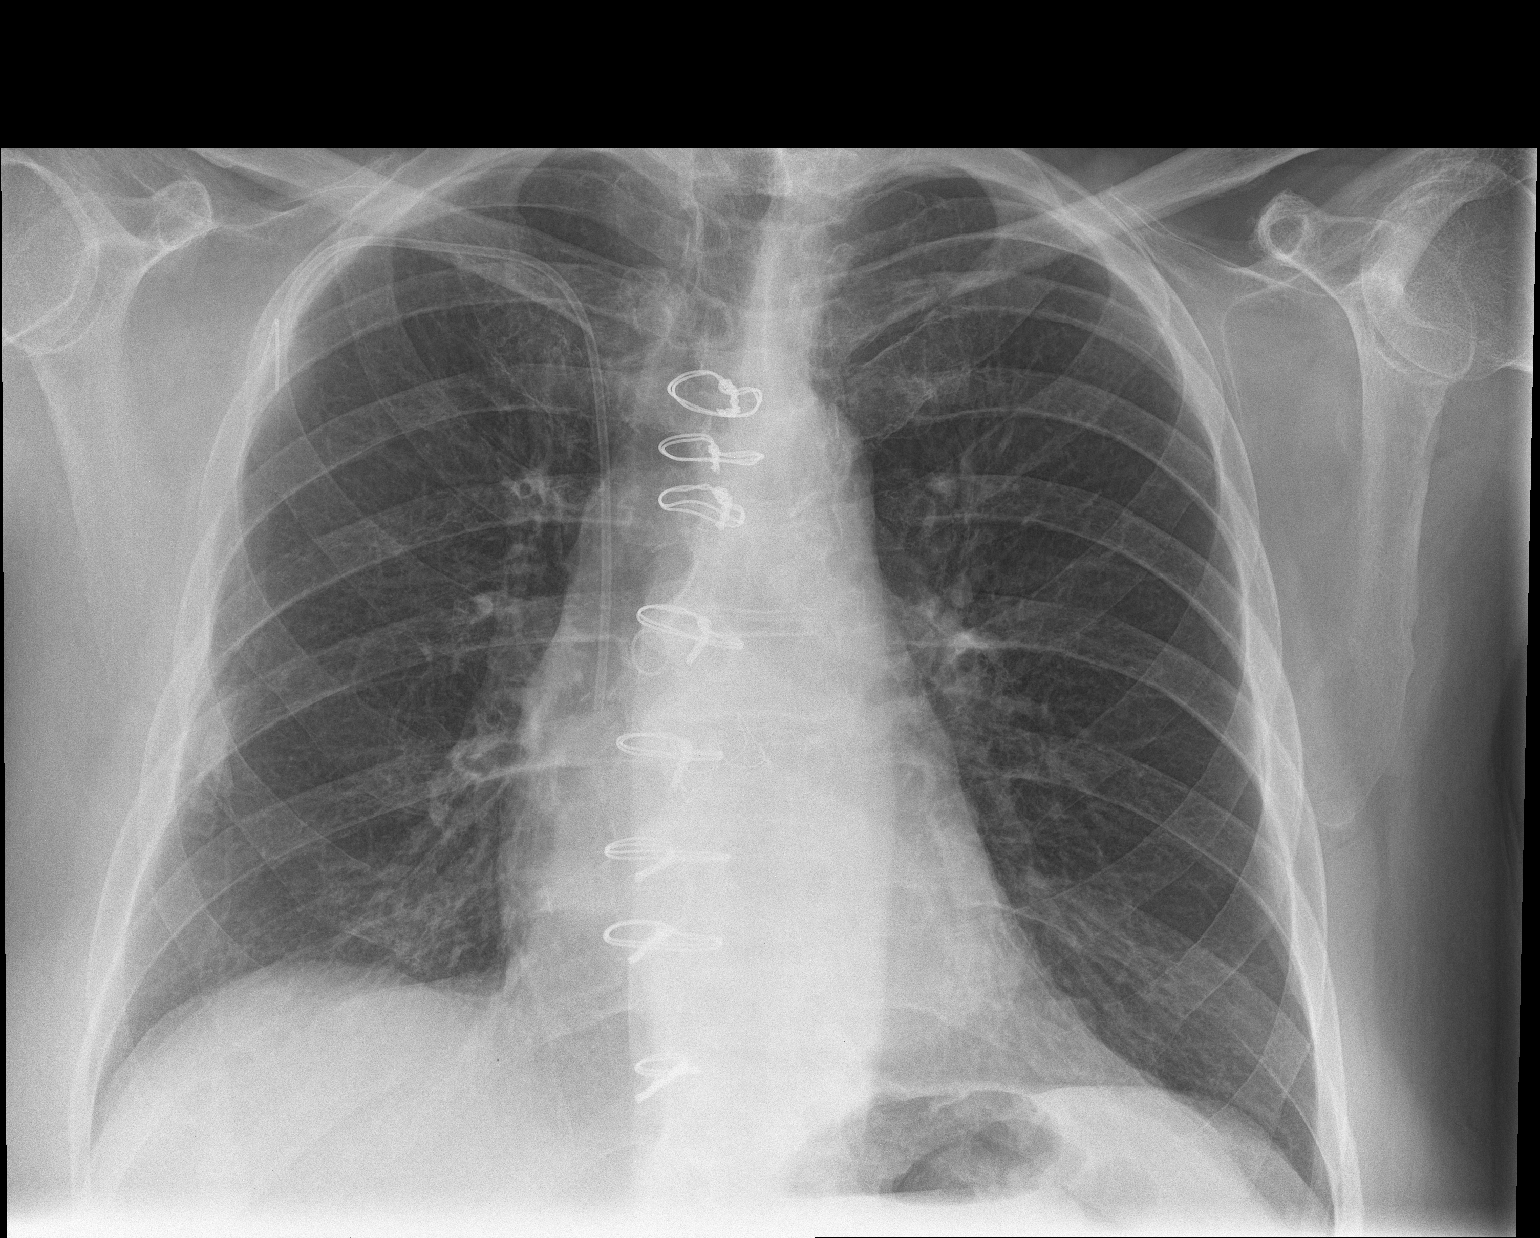

[1 of 1 positions shown; findings below may reference images not displayed]

FINDINGS: Cardiomediastinal silhouette is stable. The patient is status post
median sternotomy and aortic valve replacement. No infiltrate or
pulmonary edema. Right subclavian Port-A-Cath with tip in SVC. No
pneumothorax.
IMPRESSION: No active disease.  Right subclavian Port-A-Cath in place.

## 2016-08-26 NOTE — Progress Notes (Signed)
Cardiology Office Note  Date: 08/27/2016   ID: Ruel, Dimmick 10-Sep-1945, MRN [id number]  PCP: [name], MD  Primary Cardiologist: Rozann Lesches, MD   Chief Complaint  Patient presents with  . History of AVR    History of Present Illness: [name] is a 71 y.o. male last seen in November 2017. He presents for a routine follow-up visit. Appears to me to have overall declining health with complex morbidities. He continues to follow in the Oncology clinic management of CLL and hypogammaglobulinemia. His white blood cell count has been increasing based on most recent lab work. He states that he has a follow-up visit soon.  I reviewed his current medications. He remains on aspirin and Lopressor as well as Zocor from a cardiac perspective. No definite angina symptoms. His last echocardiogram was in 2015 as noted below.  I personally reviewed his ECG today which shows normal sinus rhythm with IVCD and repolarization abnormalities.  Past Medical History:  Diagnosis Date  . Acid reflux   . Aortic stenosis   . Arthritis   . Atrial fibrillation ([place])   . BPH (benign prostatic hypertrophy)   . CLL (chronic lymphoblastic leukemia)   . Coronary atherosclerosis of native coronary artery   . Essential hypertension, benign   . Hodgkin's disease   . HOH (hard of hearing)   . Hyperlipidemia   . Hypogammaglobulinemia ([place])   . Indirect inguinal hernia 04/13/2013  . Kidney stones   . Pre-diabetes 08/04/2011  . Wears dentures   . Wears glasses     Past Surgical History:  Procedure Laterality Date  . AORTIC VALVE REPLACEMENT  10/09   97mm Magna Pericardial   . BONE MARROW ASPIRATION    . CATARACT EXTRACTION W/PHACO Right 01/07/2014   Procedure: CATARACT EXTRACTION PHACO AND INTRAOCULAR LENS PLACEMENT ([place]);  Surgeon: Tonny Branch, MD;  Location: [facility];  Service: Ophthalmology;  Laterality: Right;  CDE:15.31  . CATARACT EXTRACTION W/PHACO Left 03/25/2014   Procedure: CATARACT EXTRACTION PHACO AND INTRAOCULAR LENS PLACEMENT ([facility]);  Surgeon: Tonny Branch, MD;  Location: [facility];  Service: Ophthalmology;  Laterality: Left;  CDE 6.55  . COLON SURGERY    . CORONARY ARTERY BYPASS GRAFT  10/09   SVG to RCA  . DENTAL SURGERY  08/2012  . ESOPHAGOGASTRODUODENOSCOPY     scrapping of throat and stretching  . EYE SURGERY Right 02/07/2014   cataract  . LITHOTRIPSY    . MASS EXCISION N/A 12/05/2012   Procedure: EXCISION MIDLINE NECK MASS;  Surgeon: Ascencion Dike, MD;  Location: [facility];  Service: ENT;  Laterality: N/A;  . MULTIPLE TOOTH EXTRACTIONS    . PORTACATH PLACEMENT    . TONGUE BIOPSY  12/2012    Current Outpatient Prescriptions  Medication Sig Dispense Refill  . acetaminophen (TYLENOL) 500 MG tablet Take 1,000 mg by mouth every 6 (six) hours as needed for fever.     Marland Kitchen acyclovir (ZOVIRAX) 200 MG capsule TAKE ONE CAPSULE BY MOUTH TWICE DAILY 60 capsule 5  . aspirin EC 81 MG tablet Take 81 mg by mouth daily.    . folic acid (FOLVITE) 1 MG tablet TAKE ONE TABLET BY MOUTH ONCE DAILY 30 tablet 5  . metoprolol (LOPRESSOR) 50 MG tablet TAKE ONE TABLET BY MOUTH TWICE DAILY 60 tablet 6  . Multiple Vitamins-Minerals (CENTRUM SILVER ULTRA MENS PO) Take 1 tablet by mouth every morning.     Marland Kitchen OMEGA 3 1000 MG CAPS Take 1,000  mg by mouth daily.     Marland Kitchen omeprazole (PRILOSEC) 20 MG capsule Take 20 mg by mouth every morning.     . simvastatin (ZOCOR) 40 MG tablet Take 1 tablet (40 mg total) by mouth at bedtime. 90 tablet 3  . sulfamethoxazole-trimethoprim (BACTRIM DS,SEPTRA DS) 800-160 MG tablet TAKE ONE TABLET BY MOUTH THREE TIMES A WEEK 36 tablet 1   No current facility-administered medications for this visit.    Facility-Administered Medications Ordered in Other Visits  Medication Dose Route Frequency Provider Last Rate Last Dose  . HYDROcodone-homatropine (HYCODAN) 5-1.5 MG/5ML syrup 5 mL  5 mL Oral Once Kefalas, Thomas S, PA-C      . potassium  chloride SA (K-DUR,KLOR-CON) CR tablet 40 mEq  40 mEq Oral Once Kefalas, Thomas S, PA-C       Allergies:  Azithromycin   Social History: The patient  reports that he has never smoked. He has never used smokeless tobacco. He reports that he does not drink alcohol or use drugs.   ROS:  Please see the history of present illness. Otherwise, complete review of systems is positive for chronic fatigue.  All other systems are reviewed and negative.   Physical Exam: VS:  BP (!) 98/54   Pulse 74   Ht 5\' 10"  (1.778 m)   Wt 204 lb (92.5 kg)   SpO2 95%   BMI 29.27 kg/m , BMI Body mass index is 29.27 kg/m.  Wt Readings from Last 3 Encounters:  08/27/16 204 lb (92.5 kg)  08/03/16 210 lb 8 oz (95.5 kg)  07/21/16 200 lb (90.7 kg)    Chronically ill-appearing, no distress.  HEENT: Conjunctiva and lids normal, oropharynx with poor dentition.  Neck: Supple, no elevated jugular venous pressure or bruits.  Lungs: Clear to auscultation, nonlabored.  Cardiac: Regular rate and rhythm, S4, 4-0/9 systolic murmur at the base, no S3.  Abdomen: Obese, nontender, bowel sounds present.  Extremities: Venous stasis noted, 1-2+ edema, distal pulses diminished. Skin: Warm and dry. Musculoskeletal: Mild kyphosis. Neuropsychiatric: Alert and oriented 3, affect appropriate.  ECG: I personally reviewed the tracing from 08/15/2015 which showed sinus rhythm with IVCD of left bundle block type.  Recent Labwork: 02/03/2016: Magnesium 1.9 07/16/2016: TSH 5.41 08/03/2016: ALT 38; AST 60; BUN 25; Creatinine, Ser 2.27; Hemoglobin 10.5; Platelets 76; Potassium 4.7; Sodium 136     Component Value Date/Time   CHOL 248 (H) 07/16/2016 0855   TRIG 318 (H) 07/16/2016 0855   HDL 15 (L) 07/16/2016 0855   CHOLHDL 16.5 (H) 07/16/2016 0855   VLDL 64 (H) 07/16/2016 0855   LDLCALC 169 (H) 07/16/2016 0855    Other Studies Reviewed Today:  Echocardiogram 01/24/2014: Study Conclusions  - Left ventricle: The cavity size was  normal. Wall thickness was increased increased in a pattern of mild to moderate LVH. Systolic function was normal. The estimated ejection fraction was in the range of 60% to 65%. Diastolic function is abnormal, indeterminate grade. - Aortic valve: A 23 mm Edwards Magna pericardial valve is in the AV position. Moderately calcified annulus. Mildly thickened leaflets. There was mild regurgitation. Mean gradient (S): 9 mm Hg. VTI ratio of LVOT to aortic valve: 0.48. Valve area (VTI): 2 cm^2. Valve area (Vmax): 1.85 cm^2. Valve area (Vmean): 2.04 cm^2. - Mitral valve: Moderately to severely calcified annulus. Moderately thickened leaflets . The findings are consistent with severe stenosis by mean pressure gradient. There was mild regurgitation. Mean gradient (D): 12 mm Hg. Cannot distinguish PHT from available spectral Doppler  tracings, unable to calculate MVA by PHT. The presence of AI prohibits accurate calculation of MVA by continuity equation. Morphologically there appears to be severe mitral stenosis. - Left atrium: The atrium was severely dilated. - Right ventricle: The cavity size was mildly dilated. - Right atrium: The atrium was moderately dilated. - Pulmonary arteries: Systolic pressure was moderately increased. PA peak pressure: 43 mm Hg (S). - Technically difficult study.  Assessment and Plan:  1. CAD status post SVG to the RCA with valve surgery in 2009. Would continue conservative management in the absence of accelerating angina symptoms. He is on aspirin and beta blocker as well as statin. ECG reviewed.  2. Aortic stenosis status post bioprosthetic AVR in 2009. Valve function was overall stable as of 2015 echocardiogram.  3. Mitral stenosis, managed conservatively. He is not a good candidate for further invasive workup or surgery.  4. History of hypertension, blood pressure low normal today.  Current medicines were reviewed with the  patient today.   Orders Placed This Encounter  Procedures  . EKG 12-Lead    Disposition: Follow-up in 6 months.  Signed, Satira Sark, MD, [facility] 08/27/2016 2:10 PM    [facility] at [facility] [address], [place], [place] [address] Phone: [phone]; Fax: [phone]

## 2016-08-27 ENCOUNTER — Ambulatory Visit (INDEPENDENT_AMBULATORY_CARE_PROVIDER_SITE_OTHER): Payer: Medicare Other | Admitting: Cardiology

## 2016-08-27 ENCOUNTER — Encounter: Payer: Self-pay | Admitting: Cardiology

## 2016-08-27 VITALS — BP 98/54 | HR 74 | Ht 70.0 in | Wt 204.0 lb

## 2016-08-27 DIAGNOSIS — I342 Nonrheumatic mitral (valve) stenosis: Secondary | ICD-10-CM | POA: Diagnosis not present

## 2016-08-27 DIAGNOSIS — Z953 Presence of xenogenic heart valve: Secondary | ICD-10-CM

## 2016-08-27 DIAGNOSIS — I2510 Atherosclerotic heart disease of native coronary artery without angina pectoris: Secondary | ICD-10-CM

## 2016-08-27 DIAGNOSIS — I10 Essential (primary) hypertension: Secondary | ICD-10-CM | POA: Diagnosis not present

## 2016-08-27 NOTE — Patient Instructions (Signed)
Your physician wants you to follow-up in: 6 months Dr Ferne Reus will receive a reminder letter in the mail two months in advance. If you don't receive a letter, please call our office to schedule the follow-up appointment.    Your physician recommends that you continue on your current medications as directed. Please refer to the Current Medication list given to you today.    If you need a refill on your cardiac medications before your next appointment, please call your pharmacy.   No testing or blood work today      Thank you for choosing [facility] !

## 2016-08-31 ENCOUNTER — Encounter (HOSPITAL_COMMUNITY): Payer: Medicare Other | Attending: Oncology

## 2016-08-31 VITALS — BP 98/52 | HR 61 | Temp 98.0°F | Resp 18 | Wt 207.2 lb

## 2016-08-31 DIAGNOSIS — C8190 Hodgkin lymphoma, unspecified, unspecified site: Secondary | ICD-10-CM | POA: Diagnosis not present

## 2016-08-31 DIAGNOSIS — D801 Nonfamilial hypogammaglobulinemia: Secondary | ICD-10-CM

## 2016-08-31 DIAGNOSIS — Z23 Encounter for immunization: Secondary | ICD-10-CM | POA: Insufficient documentation

## 2016-08-31 DIAGNOSIS — C9110 Chronic lymphocytic leukemia of B-cell type not having achieved remission: Secondary | ICD-10-CM | POA: Insufficient documentation

## 2016-08-31 LAB — CBC WITH DIFFERENTIAL/PLATELET
BLASTS: 0 %
Band Neutrophils: 0 %
Basophils Absolute: 0 10*3/uL (ref 0.0–0.1)
Basophils Relative: 0 %
EOS ABS: 1.4 10*3/uL — AB (ref 0.0–0.7)
Eosinophils Relative: 2 %
HEMATOCRIT: 30.5 % — AB (ref 39.0–52.0)
Hemoglobin: 9.7 g/dL — ABNORMAL LOW (ref 13.0–17.0)
LYMPHS PCT: 96 %
Lymphs Abs: 68.5 10*3/uL — ABNORMAL HIGH (ref 0.7–4.0)
MCH: 31.9 pg (ref 26.0–34.0)
MCHC: 31.8 g/dL (ref 30.0–36.0)
MCV: 100.3 fL — ABNORMAL HIGH (ref 78.0–100.0)
Metamyelocytes Relative: 0 %
Monocytes Absolute: 1.4 10*3/uL — ABNORMAL HIGH (ref 0.1–1.0)
Monocytes Relative: 2 %
Myelocytes: 0 %
NEUTROS PCT: 0 %
NRBC: 0 /100{WBCs}
Neutro Abs: 0 10*3/uL — ABNORMAL LOW (ref 1.7–7.7)
OTHER: 0 %
PROMYELOCYTES ABS: 0 %
Platelets: 70 10*3/uL — ABNORMAL LOW (ref 150–400)
RBC: 3.04 MIL/uL — ABNORMAL LOW (ref 4.22–5.81)
RDW: 17.1 % — ABNORMAL HIGH (ref 11.5–15.5)
WBC: 71.3 10*3/uL (ref 4.0–10.5)

## 2016-08-31 LAB — COMPREHENSIVE METABOLIC PANEL
ALK PHOS: 162 U/L — AB (ref 38–126)
ALT: 39 U/L (ref 17–63)
ANION GAP: 6 (ref 5–15)
AST: 56 U/L — ABNORMAL HIGH (ref 15–41)
Albumin: 3.2 g/dL — ABNORMAL LOW (ref 3.5–5.0)
BILIRUBIN TOTAL: 0.6 mg/dL (ref 0.3–1.2)
BUN: 23 mg/dL — ABNORMAL HIGH (ref 6–20)
CALCIUM: 8.4 mg/dL — AB (ref 8.9–10.3)
CO2: 22 mmol/L (ref 22–32)
Chloride: 108 mmol/L (ref 101–111)
Creatinine, Ser: 2.23 mg/dL — ABNORMAL HIGH (ref 0.61–1.24)
GFR calc non Af Amer: 28 mL/min — ABNORMAL LOW (ref >60)
GFR, EST AFRICAN AMERICAN: 32 mL/min — AB (ref >60)
Glucose, Bld: 117 mg/dL — ABNORMAL HIGH (ref 65–99)
Potassium: 5.1 mmol/L (ref 3.5–5.1)
Sodium: 136 mmol/L (ref 135–145)
TOTAL PROTEIN: 6 g/dL — AB (ref 6.5–8.1)

## 2016-08-31 LAB — LACTATE DEHYDROGENASE: LDH: 228 U/L — ABNORMAL HIGH (ref 98–192)

## 2016-08-31 MED ORDER — HEPARIN SOD (PORK) LOCK FLUSH 100 UNIT/ML IV SOLN
INTRAVENOUS | Status: AC
  Filled 2016-08-31: qty 5

## 2016-08-31 MED ORDER — ACETAMINOPHEN 325 MG PO TABS
650.0000 mg | ORAL_TABLET | Freq: Four times a day (QID) | ORAL | Status: DC | PRN
  Administered 2016-08-31: 650 mg via ORAL
  Filled 2016-08-31: qty 2

## 2016-08-31 MED ORDER — DEXTROSE 5 % IV SOLN
INTRAVENOUS | Status: DC
  Administered 2016-08-31: 10:00:00 via INTRAVENOUS

## 2016-08-31 MED ORDER — DIPHENHYDRAMINE HCL 25 MG PO CAPS
25.0000 mg | ORAL_CAPSULE | Freq: Once | ORAL | Status: AC
  Administered 2016-08-31: 25 mg via ORAL
  Filled 2016-08-31: qty 1

## 2016-08-31 MED ORDER — SODIUM CHLORIDE 0.9 % IV SOLN: Freq: Once | INTRAVENOUS | Status: DC

## 2016-08-31 MED ORDER — HEPARIN SOD (PORK) LOCK FLUSH 100 UNIT/ML IV SOLN
500.0000 [IU] | Freq: Once | INTRAVENOUS | Status: AC
  Administered 2016-08-31: 500 [IU] via INTRAVENOUS

## 2016-08-31 MED ORDER — IMMUNE GLOBULIN (HUMAN) 20 GM/200ML IV SOLN
0.4000 g/kg | Freq: Once | INTRAVENOUS | Status: AC
  Administered 2016-08-31: 40 g via INTRAVENOUS
  Filled 2016-08-31: qty 400

## 2016-08-31 NOTE — Patient Instructions (Signed)
[facility] at [facility] Discharge Instructions  RECOMMENDATIONS MADE BY THE CONSULTANT AND ANY TEST RESULTS WILL BE SENT TO YOUR REFERRING PHYSICIAN.  IVIG given today Follow up as scheduled.  Thank you for choosing [facility] at [facility] to provide your oncology and hematology care.  To afford each patient quality time with our provider, please arrive at least 15 minutes before your scheduled appointment time.    If you have a lab appointment with the Cancer Center please come in thru the  Main Entrance and check in at the main information desk  You need to re-schedule your appointment should you arrive 10 or more minutes late.  We strive to give you quality time with our providers, and arriving late affects you and other patients whose appointments are after yours.  Also, if you no show three or more times for appointments you may be dismissed from the clinic at the providers discretion.     Again, thank you for choosing [facility].  Our hope is that these requests will decrease the amount of time that you wait before being seen by our physicians.       _____________________________________________________________  Should you have questions after your visit to [facility], please contact our office at [phone] between the hours of 8:30 a.m. and 4:30 p.m.  Voicemails left after 4:30 p.m. will not be returned until the following business day.  For prescription refill requests, have your pharmacy contact our office.       Resources For Cancer Patients and their Caregivers ? American Cancer Society: Can assist with transportation, wigs, general needs, runs Look Good Feel Better.        1-888-227-6333 ? Cancer Care: Provides financial assistance, online support groups, medication/co-pay assistance.  1-800-813-HOPE (4673) ? [facility] Assists [place] cancer patients and their  families through emotional , educational and financial support.  336-427-4357 ? [place] DSS Where to apply for food stamps, Medicaid and utility assistance. 336-342-1394 ? [facility]: Transportation to medical appointments. 336-347-2287 ? Social Security Administration: May apply for disability if have a Stage IV cancer. 336-342-7796 1-800-772-1213 ? [place] Aging, Disability and Transit Services: Assists with nutrition, care and transit needs. 336-349-2343  Cancer Center Support Programs: @10RELATIVEDAYS@ > Cancer Support Group  2nd Tuesday of the month 1pm-2pm, Journey Room  > Creative Journey  3rd Tuesday of the month 1130am-1pm, Journey Room  > Look Good Feel Better  1st Wednesday of the month 10am-12 noon, Journey Room (Call American Cancer Society to register 1-800-395-5775)   

## 2016-08-31 NOTE — Progress Notes (Signed)
CRITICAL VALUE ALERT Critical value received:  WBC-71.3 Date of notification:  08/31/16 Time of notification: 2761 Critical value read back:  Yes.   Nurse who received alert:  M.Broox Lonigro, LPN MD notified (1st page):  T.Kefalas, PA-C

## 2016-09-01 LAB — IGG, IGA, IGM
IGM, SERUM: 9 mg/dL — AB (ref 15–143)
IgA: <5 mg/dL — ABNORMAL LOW (ref 61–437)
IgG (Immunoglobin G), Serum: 823 mg/dL (ref 700–1600)

## 2016-09-02 ENCOUNTER — Telehealth: Payer: Self-pay | Admitting: Internal Medicine

## 2016-09-02 NOTE — Telephone Encounter (Signed)
Letter mailed to pt.  

## 2016-09-02 NOTE — Telephone Encounter (Signed)
RECALL FOR TCS °

## 2016-09-21 ENCOUNTER — Other Ambulatory Visit (HOSPITAL_COMMUNITY): Payer: Self-pay | Admitting: Oncology

## 2016-09-28 ENCOUNTER — Ambulatory Visit (HOSPITAL_COMMUNITY): Payer: Medicare Other | Admitting: Adult Health

## 2016-09-28 ENCOUNTER — Ambulatory Visit (HOSPITAL_COMMUNITY): Payer: Medicare Other

## 2016-10-04 ENCOUNTER — Encounter (HOSPITAL_COMMUNITY): Payer: Self-pay

## 2016-10-04 ENCOUNTER — Encounter (HOSPITAL_COMMUNITY): Payer: Medicare Other | Attending: Oncology

## 2016-10-04 VITALS — BP 95/45 | HR 63 | Temp 98.3°F | Resp 18 | Wt 200.0 lb

## 2016-10-04 DIAGNOSIS — Z23 Encounter for immunization: Secondary | ICD-10-CM | POA: Insufficient documentation

## 2016-10-04 DIAGNOSIS — C8190 Hodgkin lymphoma, unspecified, unspecified site: Secondary | ICD-10-CM | POA: Insufficient documentation

## 2016-10-04 DIAGNOSIS — D801 Nonfamilial hypogammaglobulinemia: Secondary | ICD-10-CM

## 2016-10-04 DIAGNOSIS — C9110 Chronic lymphocytic leukemia of B-cell type not having achieved remission: Secondary | ICD-10-CM | POA: Insufficient documentation

## 2016-10-04 LAB — CBC WITH DIFFERENTIAL/PLATELET
BASOS ABS: 0 10*3/uL (ref 0.0–0.1)
BASOS PCT: 0 %
EOS PCT: 2 %
Eosinophils Absolute: 2 10*3/uL — ABNORMAL HIGH (ref 0.0–0.7)
HEMATOCRIT: 29.6 % — AB (ref 39.0–52.0)
HEMOGLOBIN: 9.5 g/dL — AB (ref 13.0–17.0)
LYMPHS ABS: 96.6 10*3/uL — AB (ref 0.7–4.0)
LYMPHS PCT: 95 %
MCH: 33.2 pg (ref 26.0–34.0)
MCHC: 32.1 g/dL (ref 30.0–36.0)
MCV: 103.5 fL — AB (ref 78.0–100.0)
MONOS PCT: 1 %
Monocytes Absolute: 1 10*3/uL (ref 0.1–1.0)
NEUTROS ABS: 2 10*3/uL (ref 1.7–7.7)
Neutrophils Relative %: 2 %
Platelets: 62 10*3/uL — ABNORMAL LOW (ref 150–400)
RBC: 2.86 MIL/uL — ABNORMAL LOW (ref 4.22–5.81)
RDW: 17.9 % — ABNORMAL HIGH (ref 11.5–15.5)
WBC: 101.6 10*3/uL (ref 4.0–10.5)

## 2016-10-04 LAB — COMPREHENSIVE METABOLIC PANEL
ALK PHOS: 161 U/L — AB (ref 38–126)
ALT: 26 U/L (ref 17–63)
ANION GAP: 9 (ref 5–15)
AST: 48 U/L — ABNORMAL HIGH (ref 15–41)
Albumin: 3.2 g/dL — ABNORMAL LOW (ref 3.5–5.0)
BILIRUBIN TOTAL: 0.7 mg/dL (ref 0.3–1.2)
BUN: 23 mg/dL — ABNORMAL HIGH (ref 6–20)
CALCIUM: 8.4 mg/dL — AB (ref 8.9–10.3)
CO2: 23 mmol/L (ref 22–32)
Chloride: 105 mmol/L (ref 101–111)
Creatinine, Ser: 2.41 mg/dL — ABNORMAL HIGH (ref 0.61–1.24)
GFR calc Af Amer: 29 mL/min — ABNORMAL LOW (ref >60)
GFR, EST NON AFRICAN AMERICAN: 25 mL/min — AB (ref >60)
GLUCOSE: 113 mg/dL — AB (ref 65–99)
Potassium: 5.2 mmol/L — ABNORMAL HIGH (ref 3.5–5.1)
Sodium: 137 mmol/L (ref 135–145)
TOTAL PROTEIN: 5.9 g/dL — AB (ref 6.5–8.1)

## 2016-10-04 LAB — LACTATE DEHYDROGENASE: LDH: 243 U/L — AB (ref 98–192)

## 2016-10-04 MED ORDER — DEXTROSE 5 % IV SOLN
INTRAVENOUS | Status: DC
  Administered 2016-10-04: 12:00:00 via INTRAVENOUS

## 2016-10-04 MED ORDER — IMMUNE GLOBULIN (HUMAN) 20 GM/200ML IV SOLN
0.4000 g/kg | Freq: Once | INTRAVENOUS | Status: DC
  Filled 2016-10-04: qty 400

## 2016-10-04 MED ORDER — IMMUNE GLOBULIN (HUMAN) 10 GM/100ML IV SOLN
400.0000 mg/kg | INTRAVENOUS | Status: DC
Start: 2016-10-04 — End: 2016-10-04
  Filled 2016-10-04 (×2): qty 50

## 2016-10-04 MED ORDER — DIPHENHYDRAMINE HCL 25 MG PO TABS
25.0000 mg | ORAL_TABLET | Freq: Once | ORAL | Status: AC
Start: 2016-10-04 — End: 2016-10-04
  Administered 2016-10-04: 25 mg via ORAL
  Filled 2016-10-04 (×2): qty 1

## 2016-10-04 MED ORDER — HEPARIN SOD (PORK) LOCK FLUSH 100 UNIT/ML IV SOLN
500.0000 [IU] | Freq: Once | INTRAVENOUS | Status: AC
  Administered 2016-10-04: 500 [IU] via INTRAVENOUS
  Filled 2016-10-04: qty 5

## 2016-10-04 MED ORDER — ACETAMINOPHEN 325 MG PO TABS
650.0000 mg | ORAL_TABLET | Freq: Four times a day (QID) | ORAL | Status: DC | PRN
  Administered 2016-10-04: 650 mg via ORAL
  Filled 2016-10-04: qty 2

## 2016-10-04 MED ORDER — IMMUNE GLOBULIN (HUMAN) 5 GM/50ML IV SOLN
400.0000 mg/kg | Freq: Once | INTRAVENOUS | Status: AC
  Administered 2016-10-04: 35 g via INTRAVENOUS
  Filled 2016-10-04: qty 50

## 2016-10-04 NOTE — Progress Notes (Signed)
Labs done today, IVIG given as ordered. Patient tolerated well, no issues. Vitals stable and discharged home from clinic ambulatory today. Follow up as scheduled.

## 2016-10-04 NOTE — Patient Instructions (Signed)
[facility] at [facility] Discharge Instructions  RECOMMENDATIONS MADE BY THE CONSULTANT AND ANY TEST RESULTS WILL BE SENT TO YOUR REFERRING PHYSICIAN.  IVIG given today Follow up as scheduled.  Thank you for choosing [facility] at [facility] to provide your oncology and hematology care.  To afford each patient quality time with our provider, please arrive at least 15 minutes before your scheduled appointment time.    If you have a lab appointment with the Cancer Center please come in thru the  Main Entrance and check in at the main information desk  You need to re-schedule your appointment should you arrive 10 or more minutes late.  We strive to give you quality time with our providers, and arriving late affects you and other patients whose appointments are after yours.  Also, if you no show three or more times for appointments you may be dismissed from the clinic at the providers discretion.     Again, thank you for choosing [facility].  Our hope is that these requests will decrease the amount of time that you wait before being seen by our physicians.       _____________________________________________________________  Should you have questions after your visit to [facility], please contact our office at [phone] between the hours of 8:30 a.m. and 4:30 p.m.  Voicemails left after 4:30 p.m. will not be returned until the following business day.  For prescription refill requests, have your pharmacy contact our office.       Resources For Cancer Patients and their Caregivers ? American Cancer Society: Can assist with transportation, wigs, general needs, runs Look Good Feel Better.        1-888-227-6333 ? Cancer Care: Provides financial assistance, online support groups, medication/co-pay assistance.  1-800-813-HOPE (4673) ? [facility] Assists [place] cancer patients and their  families through emotional , educational and financial support.  336-427-4357 ? [place] DSS Where to apply for food stamps, Medicaid and utility assistance. 336-342-1394 ? [facility]: Transportation to medical appointments. 336-347-2287 ? Social Security Administration: May apply for disability if have a Stage IV cancer. 336-342-7796 1-800-772-1213 ? [place] Aging, Disability and Transit Services: Assists with nutrition, care and transit needs. 336-349-2343  Cancer Center Support Programs: @10RELATIVEDAYS@ > Cancer Support Group  2nd Tuesday of the month 1pm-2pm, Journey Room  > Creative Journey  3rd Tuesday of the month 1130am-1pm, Journey Room  > Look Good Feel Better  1st Wednesday of the month 10am-12 noon, Journey Room (Call American Cancer Society to register 1-800-395-5775)   

## 2016-10-04 NOTE — Progress Notes (Signed)
CRITICAL VALUE ALERT  Critical Value:  WBC 101.6  Date & Time Notied:  10/04/2016 @ 11:11  Provider Notified: Primary RN C.Page, RN notified, Dr. Talbert Cage notified.

## 2016-10-05 LAB — IGG, IGA, IGM
IGG (IMMUNOGLOBIN G), SERUM: 747 mg/dL (ref 700–1600)
IGM, SERUM: 13 mg/dL — AB (ref 15–143)

## 2016-10-05 NOTE — Progress Notes (Addendum)
[name] Helper, MD [address] [place] [address]  Leukemia, lymphocytic, chronic [facility]) - Plan: CBC with Differential, Comprehensive metabolic panel, Ibrutinib (IMBRUVICA) 420 MG TABS, Uric acid, Uric acid, CBC with Differential, Comprehensive metabolic panel, Lactate dehydrogenase, Uric acid  Hyperkalemia - Plan: sodium polystyrene (KAYEXALATE) powder  CURRENT THERAPY: Monthly IVIG  INTERVAL HISTORY: [name] 71 y.o. 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  S/P 3 cycles of BR (09/09/2015- 12/09/2015) with multiple delays in treatment requiring deferring of treatment. AND History of Hodgkin's lymphoma, treated in 2006. 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 And CKD, stage III    Hodgkin's disease ([place])   10/02/2007 Initial Diagnosis    HODGKIN'S DISEASE       Leukemia, lymphocytic, chronic ([place])   07/22/2015 Procedure    IR bone marrow aspiration and biopsy      07/23/2015 Pathology Results    Bone Marrow, Aspirate,Biopsy, and Clot, left iliac crest - EXTENSIVE INVOLVEMENT BY CHRONIC LYMPHOCYTIC LEUKEMIA. PERIPHERAL BLOOD: - CHRONIC LYMPHOCYTIC LEUKEMIA. - NORMOCYTIC ANEMIA. - THROMBOCYTOPENIA.      07/23/2015 Pathology Results    Bone Marrow Flow Cytometry - CHRONIC LYMPHOCYTIC LEUKEMIA      08/20/2015 Imaging    CT CAP- Mediastinal and hilar lymphadenopathy consistent with CLL. No pulmonary lesions. Marked splenomegaly. Mild upper abdominal retroperitoneal adenopathy.      08/27/2015 Miscellaneous    Imbruvica therapy is cost-prohibitive.      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      11/04/2015  Treatment Plan Change    Treatment deferred due to thrombocytopenia      02/03/2016 Treatment Plan Change    BR chemotherapy discontinued after 3 cycles of chemotherapy due to multiple delayed treatments secondary to chemotherapy-induced cytopenias.       HPI Elements   Location: Blood  Quality: CLL  Severity: Severe  Duration: Requiring treatment in June 2017  Context: Extensive bone marrow involvement  Timing:   Modifying Factors: Poorly tolerated treatment due to long periods of cytopenias  Associated Signs & Symptoms: Hypogammaglobulinemia and recurrent URIs.   He reports an increase in his fatigue over the last few months.  He does note intermittent nausea this is well controlled with antiemetics.    He rates his energy level at 50%.  He notes that his appetite is 50%.  He reports a generalized pain and he rates it as a 2 out of 10.   He has had his penile lesion evaluated.  He notes that it is improving.  He is advised that if it does not resolve soon, then a biopsy would be recommended.  Review of Systems  Constitutional: Negative.  Negative for chills, fever and weight loss.  HENT: Negative.   Eyes: Negative.   Respiratory: Negative.  Negative for cough.   Cardiovascular: Negative.  Negative for chest pain.  Gastrointestinal: Negative.  Negative for blood in stool, constipation, diarrhea, melena, nausea and vomiting.  Genitourinary: Negative.   Musculoskeletal: Negative.   Skin: Negative.   Neurological: Negative.  Negative for weakness.  Endo/Heme/Allergies: Negative.   Psychiatric/Behavioral: Negative.  Past Medical History:  Diagnosis Date  . Acid reflux   . Aortic stenosis   . Arthritis   . Atrial fibrillation ([place])   . BPH (benign prostatic hypertrophy)   . CLL (chronic lymphoblastic leukemia)   . Coronary atherosclerosis of native coronary artery   . Essential hypertension, benign   . Hodgkin's disease   . HOH (hard of hearing)   . Hyperlipidemia     . Hypogammaglobulinemia ([place])   . Indirect inguinal hernia 04/13/2013  . Kidney stones   . Pre-diabetes 08/04/2011  . Wears dentures   . Wears glasses     Past Surgical History:  Procedure Laterality Date  . AORTIC VALVE REPLACEMENT  10/09   37mm Magna Pericardial   . BONE MARROW ASPIRATION    . CATARACT EXTRACTION W/PHACO Right 01/07/2014   Procedure: CATARACT EXTRACTION PHACO AND INTRAOCULAR LENS PLACEMENT ([place]);  Surgeon: Tonny Branch, MD;  Location: [facility];  Service: Ophthalmology;  Laterality: Right;  CDE:15.31  . CATARACT EXTRACTION W/PHACO Left 03/25/2014   Procedure: CATARACT EXTRACTION PHACO AND INTRAOCULAR LENS PLACEMENT ([facility]);  Surgeon: Tonny Branch, MD;  Location: [facility];  Service: Ophthalmology;  Laterality: Left;  CDE 6.55  . COLON SURGERY    . CORONARY ARTERY BYPASS GRAFT  10/09   SVG to RCA  . DENTAL SURGERY  08/2012  . ESOPHAGOGASTRODUODENOSCOPY     scrapping of throat and stretching  . EYE SURGERY Right 02/07/2014   cataract  . LITHOTRIPSY    . MASS EXCISION N/A 12/05/2012   Procedure: EXCISION MIDLINE NECK MASS;  Surgeon: Ascencion Dike, MD;  Location: [place];  Service: ENT;  Laterality: N/A;  . MULTIPLE TOOTH EXTRACTIONS    . PORTACATH PLACEMENT    . TONGUE BIOPSY  12/2012    Family History  Problem Relation Age of Onset  . Heart failure Mother   . Blindness Mother   . Cancer Father 33       Lung   . Cancer Brother 48       acute leukemia     Social History   Social History  . Marital status: Widowed    Spouse name: N/A  . Number of children: N/A  . Years of education: N/A   Social History Main Topics  . Smoking status: Never Smoker  . Smokeless tobacco: Never Used     Comment: pt denies tobacco use   . Alcohol use No     Comment: pt denies alcohol   . Drug use: No  . Sexual activity: Not Currently   Other Topics Concern  . None   Social History Narrative  . None     PHYSICAL EXAMINATION  ECOG PERFORMANCE STATUS: 1 -  Symptomatic but completely ambulatory  Vitals:   10/06/16 1056  BP: (!) 97/48  Pulse: 66  Resp: 18  Temp: 97.6 F (36.4 C)    GENERAL:alert, no distress, well nourished, well developed, comfortable, cooperative, obese, smiling and cat urine odor SKIN: skin color, texture, turgor are normal, no rashes or significant lesions HEAD: Normocephalic, No masses, lesions, tenderness or abnormalities EYES: normal, EOMI, Conjunctiva are pink and non-injected EARS: External ears normal OROPHARYNX:lips, buccal mucosa, and tongue normal and mucous membranes are moist  NECK: supple, no adenopathy, trachea midline LYMPH:  no palpable lymphadenopathy BREAST:not examined LUNGS: clear to auscultation  HEART: regular rate & rhythm, no murmurs and no gallops ABDOMEN:abdomen soft and normal bowel sounds BACK: Back symmetric, no curvature. EXTREMITIES:less then 2 second capillary  refill, no joint deformities, effusion, or inflammation, no skin discoloration, no cyanosis  NEURO: alert & oriented x 3 with fluent speech, no focal motor/sensory deficits, gait normal   LABORATORY DATA: CBC    Component Value Date/Time   WBC 115.1 (HH) 10/06/2016 1146   RBC 2.94 (L) 10/06/2016 1146   HGB 9.6 (L) 10/06/2016 1146   HCT 30.5 (L) 10/06/2016 1146   PLT 63 (L) 10/06/2016 1146   MCV 103.7 (H) 10/06/2016 1146   MCH 32.7 10/06/2016 1146   MCHC 31.5 10/06/2016 1146   RDW 18.1 (H) 10/06/2016 1146   LYMPHSABS 112.7 (H) 10/06/2016 1146   MONOABS 1.2 (H) 10/06/2016 1146   EOSABS 1.2 (H) 10/06/2016 1146   BASOSABS 0.0 10/06/2016 1146      Chemistry      Component Value Date/Time   NA 134 (L) 10/06/2016 1146   K 6.0 (H) 10/06/2016 1146   CL 104 10/06/2016 1146   CO2 25 10/06/2016 1146   BUN 23 (H) 10/06/2016 1146   CREATININE 2.40 (H) 10/06/2016 1146   CREATININE 2.10 (H) 07/16/2016 0855      Component Value Date/Time   CALCIUM 8.7 (L) 10/06/2016 1146   ALKPHOS 160 (H) 10/06/2016 1146   AST 46 (H)  10/06/2016 1146   ALT 26 10/06/2016 1146   BILITOT 0.7 10/06/2016 1146        PENDING LABS:   RADIOGRAPHIC STUDIES:  No results found.   PATHOLOGY:    ASSESSMENT AND PLAN:  Leukemia, lymphocytic, chronic (H[facility]) CLL with progressive CBC abnormalities, S/P bone marrow aspiration and biopsy on 07/22/2015 demonstrating diffuse involvement of CLL.  S/P 3 cycles of BR (09/09/2015- 12/09/2015) with multiple delays in treatment requiring deferring of treatment. AND History of Hodgkin's lymphoma, treated in 2006. AND Hypogammaglobulinemia, started IVIG on 10/13/15 due to recurrent URI and remains on low-dose IVIG monthly. And CKD, stage III  Labs monthly with IVIG infusion: CBC diff, CMET, IgG, IgA, IgM, LDH.  I personally reviewed and went over laboratory results with the patient.  The results are noted within this dictation.  Labs today: CBC diff, CMET.  I personally reviewed and went over laboratory results with the patient.  The results are noted within this dictation.    Hyperkalemia is noted today.  I have added a uric acid to evaluate for tumor lysis syndrome.  Rx for Kayexalate is escribed.  He is to return Monday for repeat labs.  He reports an improvement in his penile lesion.  This has not been evaluated by me today as he reports he has followed-up with his urology regarding this recently.  If this does not resolve soon, he will need a biopsy.  His counts have worsened over the last few months.  His WBC is climbing rapidly, his anemia is worse, and his thrombocytopenia is progressively decreased.  As a result, it it time to return to systemic therapy.  I have recommended Imbruvica despite having financial difficulty getting this medication last year.  He did not tolerate BR chemotherapy well and required a discontinuation in treatment after only 3 cycles due to prolonged cytopenias.  Rx for Kate Sable is printed and provided to [place] for insurance approval and cost  analysis.  Return in 3 weeks for follow-up.  Addendum: Uric acid elevated concerning for tumor lysis syndrome.  I have started Allopurinol.  Due to his renal function, dose has been decreased x 50%.   Number of Diagnoses or Treatment Options- Section A:  Problems to Exam Physician Problem(s) Number x Points= Results  Self-limited or minor (stable, improved, or worsening)  Max=2  1   Est. Problem (to examiner); stable, improved   1   Est. Problem (to examiner); worsening   2   New problem (to examiner); no additional work-up planned  Max=1 3   New problem (to examiner); add. work-up planned   4      Total:     Amount and/or Complexity of Data to be Reviewed- Section B    Data to be reviewed: Points    Review and/or order of clinical lab tests 1  []    Review and/or order of tests in the radiology section of CPT (includes nuclear med & other except cardiac cath & ECG) 1  []    Review and/or order of tests in the medicine section of CPT (e.g. EKG, cardiac cath, non-invasive vascular studies, pulmonary function studies) 1  []    Discussion of test results with performing physician 1  []    Decision to obtain old records and/or obtaining history from someone other than patient 1  []    Review and summarization of old records and/or obtaining history from someone other than patient and/or discussion of case with another health care provider 2  []    Independent visualization of image, tracing, or specimen itself (not simply review report) 2  []    Total:       Risk of  complications and/or Morbidity or Mortality- Section C  Level of Risk: Presenting Problem(s) Diagnostic Procedure(s) Ordered Management Options Selected  Minimal One self-limited or minor problem (eg cold, insect bite, tinea corporis)  Lab test requiring venipuncture  Chest xray  EKG/EEG  [place] or Echo  KOH prep  Urinalysis  Rest  Gargles  Elastic bandages  Superficial dressings  Low  Two or more self-limited  or minor problems  One stable chronic illness (well-controlled HTN, non-insulin dependent diabetes, cataract, BPH)  Acute uncomplicated illness or injury (cystitis, allergic rhinitis, simple sprain)  Physiologic test not under stress (pulm fnx tests)  Non-cardiovascular imaging studies with contrast (barium enema)  Superficial needle biopsy  Clinical laboratory tests requiring arterial puncture  Skin biopsies  OTC drugs  Minor surgery with no identified risk factors  Physical therapy  Occupational therapy  IV fluids without additives  Moderate  One or more chronic illnesses with mild exacerbation, progression, or side effects of treatment  Two or more stable chronic illnesses  Undiagnosed new problem with uncertain prognosis (lump in breast)  Acute illness with systemic symptoms (pyelonephritis, pneumonitis, colitis)  Acute complicated injury (head injury with brief loss of consciousness)  Physiologic test under stress (cardiac stress test, fetal contraction stress test)  Diagnostic endoscopies with no identified risk factors  Deep needle or incisional biopsy  Cardiovascular imaging studies with contrast and no identified risk factors (arteriogram, cardiac cath)  Obtain fluid from body cavity (LP, thoracentesis, culdocentesis)  Minor surgery with identified risk factors  Elective surgery (open, percutaneous, or endoscopic) with no identified risk factors  Prescription drug management  Therapeutic nuclear medicine  IV fluids with additives  Closed treatment of fracture of dislocation without manipulation  High  One or more chronic illnesses with severe exacerbation, progression, or side effects of treatment.  Acute or chronic illnesses or injuries that may pose a threat to life or bodily function (multiple trauma, acute MI, PE, severe respiratory distress, progressive severe rheumatoid arthritis, psychiatric illness with potential threat to self or others,  peritonitis, acute renal failure)  An abrupt change in neurological status (seizure, TIA, weakness, sensory loss)  Cardiovascular imaging studies with contrast with identified risk factors  Cardiac electrophysiological tests  Diagnostic endoscopies with identified risk factors  Discography   Elective major surgery (open, percutaneous, or endoscopic) with identified risk factor  Emergency major surgery (open, percutaneous, or endoscopic)  Parental controlled substances  Drug therapy requiring intensive monitoring for toxicity  Decision not to resuscitate or deescalate care because of poor prognosis     Final Result of Complexity      Choose decision making level with 2 or 3 checks OR choose the decision making level on Section B       A Number of diagnoses or treatment options  []   </= 1 Minimal  []   2 Limited  []   3 Multiple  []   >/= 4 Extensive  B Amount and complexity of data  []   </= 1 Minimal or low  []   2 Limited  []   3  Moderate  []   >/= 4 Extensive  C Highest risk  []   Minimal  []   Low  []   Moderate  []   High   Type of decision making  []   Straight-forward  []   Low Complexity  []   Moderate- Complexity  []   High- Complexity     ORDERS PLACED FOR THIS ENCOUNTER: Orders Placed This Encounter  Procedures  . CBC with Differential  . Comprehensive metabolic panel  . Uric acid  . CBC with Differential  . Comprehensive metabolic panel  . Lactate dehydrogenase  . Uric acid    MEDICATIONS PRESCRIBED THIS ENCOUNTER: Meds ordered this encounter  Medications  . Ibrutinib (IMBRUVICA) 420 MG TABS    Sig: Take 420 mg by mouth daily.    Dispense:  28 tablet    Refill:  1    Order Specific Question:   Supervising Provider    Answer:   Brunetta Genera [[id number]]  . sodium polystyrene (KAYEXALATE) powder    Sig: Take by mouth once. Take 30 grams today, PO    Dispense:  454 g    Refill:  0    Order Specific Question:   Supervising Provider     Answer:   Brunetta Genera [[id number]]    THERAPY PLAN:  Will plan on restarting systemic therapy for worsening CLL  All questions were answered. The patient knows to call the clinic with any problems, questions or concerns. We can certainly see the patient much sooner if necessary.  Patient and plan discussed with Dr. Twana First and she is in agreement with the aforementioned.   This note is electronically signed by: Doy Mince 10/06/2016 3:26 PM

## 2016-10-05 NOTE — Assessment & Plan Note (Addendum)
CLL with progressive CBC abnormalities, S/P bone marrow aspiration and biopsy on 07/22/2015 demonstrating diffuse involvement of CLL.  S/P 3 cycles of BR (09/09/2015- 12/09/2015) with multiple delays in treatment requiring deferring of treatment. AND History of Hodgkin's lymphoma, treated in 2006. AND Hypogammaglobulinemia, started IVIG on 10/13/15 due to recurrent URI and remains on low-dose IVIG monthly. And CKD, stage III  Labs monthly with IVIG infusion: CBC diff, CMET, IgG, IgA, IgM, LDH.  I personally reviewed and went over laboratory results with the patient.  The results are noted within this dictation.  Labs today: CBC diff, CMET.  I personally reviewed and went over laboratory results with the patient.  The results are noted within this dictation.    Hyperkalemia is noted today.  I have added a uric acid to evaluate for tumor lysis syndrome.  Rx for Kayexalate is escribed.  He is to return Monday for repeat labs.  He reports an improvement in his penile lesion.  This has not been evaluated by me today as he reports he has followed-up with his urology regarding this recently.  If this does not resolve soon, he will need a biopsy.  His counts have worsened over the last few months.  His WBC is climbing rapidly, his anemia is worse, and his thrombocytopenia is progressively decreased.  As a result, it it time to return to systemic therapy.  I have recommended Imbruvica despite having financial difficulty getting this medication last year.  He did not tolerate BR chemotherapy well and required a discontinuation in treatment after only 3 cycles due to prolonged cytopenias.  Rx for Kate Sable is printed and provided to [place] for insurance approval and cost analysis.  Return in 3 weeks for follow-up.  Addendum: Uric acid elevated concerning for tumor lysis syndrome.  I have started Allopurinol.  Due to his renal function, dose has been decreased x 50%.  Addendum: 10/12/2016 Patient's  co-pay for Kate Sable is ~ $2800.  We were able to ascertain co-pay assistance for up to $7600 for the calendar year.  Therefore, by month #4 of treatment, he will be paying the full co-pay amount.  Therefore, will change therapy plan to Idelalisib + Rituxan.

## 2016-10-06 ENCOUNTER — Telehealth (HOSPITAL_COMMUNITY): Payer: Self-pay | Admitting: *Deleted

## 2016-10-06 ENCOUNTER — Encounter (HOSPITAL_COMMUNITY): Payer: Self-pay | Admitting: Oncology

## 2016-10-06 ENCOUNTER — Other Ambulatory Visit (HOSPITAL_COMMUNITY): Payer: Self-pay | Admitting: Oncology

## 2016-10-06 ENCOUNTER — Encounter (HOSPITAL_BASED_OUTPATIENT_CLINIC_OR_DEPARTMENT_OTHER): Payer: Medicare Other | Admitting: Oncology

## 2016-10-06 ENCOUNTER — Encounter (HOSPITAL_COMMUNITY): Payer: Medicare Other

## 2016-10-06 VITALS — BP 97/48 | HR 66 | Temp 97.6°F | Resp 18 | Ht 70.0 in | Wt 200.3 lb

## 2016-10-06 DIAGNOSIS — E875 Hyperkalemia: Secondary | ICD-10-CM

## 2016-10-06 DIAGNOSIS — C9110 Chronic lymphocytic leukemia of B-cell type not having achieved remission: Secondary | ICD-10-CM

## 2016-10-06 DIAGNOSIS — Z23 Encounter for immunization: Secondary | ICD-10-CM | POA: Diagnosis not present

## 2016-10-06 DIAGNOSIS — C8190 Hodgkin lymphoma, unspecified, unspecified site: Secondary | ICD-10-CM | POA: Diagnosis not present

## 2016-10-06 DIAGNOSIS — D801 Nonfamilial hypogammaglobulinemia: Secondary | ICD-10-CM

## 2016-10-06 DIAGNOSIS — E790 Hyperuricemia without signs of inflammatory arthritis and tophaceous disease: Secondary | ICD-10-CM | POA: Diagnosis not present

## 2016-10-06 DIAGNOSIS — Z8571 Personal history of Hodgkin lymphoma: Secondary | ICD-10-CM

## 2016-10-06 DIAGNOSIS — N183 Chronic kidney disease, stage 3 (moderate): Secondary | ICD-10-CM | POA: Diagnosis not present

## 2016-10-06 LAB — COMPREHENSIVE METABOLIC PANEL
ALK PHOS: 160 U/L — AB (ref 38–126)
ALT: 26 U/L (ref 17–63)
AST: 46 U/L — AB (ref 15–41)
Albumin: 3.1 g/dL — ABNORMAL LOW (ref 3.5–5.0)
Anion gap: 5 (ref 5–15)
BILIRUBIN TOTAL: 0.7 mg/dL (ref 0.3–1.2)
BUN: 23 mg/dL — AB (ref 6–20)
CALCIUM: 8.7 mg/dL — AB (ref 8.9–10.3)
CO2: 25 mmol/L (ref 22–32)
CREATININE: 2.4 mg/dL — AB (ref 0.61–1.24)
Chloride: 104 mmol/L (ref 101–111)
GFR calc Af Amer: 29 mL/min — ABNORMAL LOW (ref >60)
GFR, EST NON AFRICAN AMERICAN: 25 mL/min — AB (ref >60)
GLUCOSE: 113 mg/dL — AB (ref 65–99)
POTASSIUM: 6 mmol/L — AB (ref 3.5–5.1)
Sodium: 134 mmol/L — ABNORMAL LOW (ref 135–145)
TOTAL PROTEIN: 6.4 g/dL — AB (ref 6.5–8.1)

## 2016-10-06 LAB — CBC WITH DIFFERENTIAL/PLATELET
Basophils Absolute: 0 10*3/uL (ref 0.0–0.1)
Basophils Relative: 0 %
EOS PCT: 1 %
Eosinophils Absolute: 1.2 10*3/uL — ABNORMAL HIGH (ref 0.0–0.7)
HEMATOCRIT: 30.5 % — AB (ref 39.0–52.0)
HEMOGLOBIN: 9.6 g/dL — AB (ref 13.0–17.0)
LYMPHS ABS: 112.7 10*3/uL — AB (ref 0.7–4.0)
LYMPHS PCT: 98 %
MCH: 32.7 pg (ref 26.0–34.0)
MCHC: 31.5 g/dL (ref 30.0–36.0)
MCV: 103.7 fL — AB (ref 78.0–100.0)
MONOS PCT: 1 %
Monocytes Absolute: 1.2 10*3/uL — ABNORMAL HIGH (ref 0.1–1.0)
Neutro Abs: 0 10*3/uL — ABNORMAL LOW (ref 1.7–7.7)
Neutrophils Relative %: 0 %
Platelets: 63 10*3/uL — ABNORMAL LOW (ref 150–400)
RBC: 2.94 MIL/uL — AB (ref 4.22–5.81)
RDW: 18.1 % — AB (ref 11.5–15.5)
WBC: 115.1 10*3/uL — AB (ref 4.0–10.5)

## 2016-10-06 LAB — URIC ACID: URIC ACID, SERUM: 10.6 mg/dL — AB (ref 4.4–7.6)

## 2016-10-06 MED ORDER — ALLOPURINOL 100 MG PO TABS: 200.0000 mg | ORAL_TABLET | Freq: Two times a day (BID) | ORAL | 1 refills | Status: AC

## 2016-10-06 MED ORDER — IBRUTINIB 420 MG PO TABS: 420.0000 mg | ORAL_TABLET | Freq: Every day | ORAL | 1 refills | Status: DC

## 2016-10-06 MED ORDER — SODIUM POLYSTYRENE SULFONATE PO POWD: Freq: Once | ORAL | 0 refills | Status: AC

## 2016-10-06 NOTE — Patient Instructions (Signed)
You were seen by Kirby Crigler, PA today. You will be getting repeat labs today. We will want another set of labs drawn in 3 weeks when we see you again.  We are currently working to get chemo pill approved for you.

## 2016-10-06 NOTE — Telephone Encounter (Signed)
Patient notified that his potassium level was high and that a prescription for kayexelate was called in and patient needed to pick it up today and take it today. Repeat labs (k+) on Monday @ 1120. Pt verbalized understanding of instructions.

## 2016-10-06 NOTE — Progress Notes (Signed)
CRITICAL VALUE ALERT Critical value received:  WBC 115.1 Date of notification:  10/06/16 Time of notification: 1207 Critical value read back:  Yes.   Nurse who received alert:  Isidoro Donning RN MD notified: Kirby Crigler PA-C, pt being seen today in clinic

## 2016-10-11 ENCOUNTER — Encounter (HOSPITAL_COMMUNITY): Payer: Medicare Other

## 2016-10-11 DIAGNOSIS — Z23 Encounter for immunization: Secondary | ICD-10-CM | POA: Diagnosis not present

## 2016-10-11 DIAGNOSIS — C9110 Chronic lymphocytic leukemia of B-cell type not having achieved remission: Secondary | ICD-10-CM

## 2016-10-11 DIAGNOSIS — C8190 Hodgkin lymphoma, unspecified, unspecified site: Secondary | ICD-10-CM | POA: Diagnosis not present

## 2016-10-11 LAB — COMPREHENSIVE METABOLIC PANEL
ALBUMIN: 3 g/dL — AB (ref 3.5–5.0)
ALT: 22 U/L (ref 17–63)
AST: 46 U/L — AB (ref 15–41)
Alkaline Phosphatase: 166 U/L — ABNORMAL HIGH (ref 38–126)
Anion gap: 6 (ref 5–15)
BILIRUBIN TOTAL: 0.4 mg/dL (ref 0.3–1.2)
BUN: 21 mg/dL — AB (ref 6–20)
CHLORIDE: 107 mmol/L (ref 101–111)
CO2: 23 mmol/L (ref 22–32)
Calcium: 8.5 mg/dL — ABNORMAL LOW (ref 8.9–10.3)
Creatinine, Ser: 2.32 mg/dL — ABNORMAL HIGH (ref 0.61–1.24)
GFR calc Af Amer: 31 mL/min — ABNORMAL LOW (ref >60)
GFR calc non Af Amer: 26 mL/min — ABNORMAL LOW (ref >60)
GLUCOSE: 107 mg/dL — AB (ref 65–99)
POTASSIUM: 5.1 mmol/L (ref 3.5–5.1)
Sodium: 136 mmol/L (ref 135–145)
TOTAL PROTEIN: 6.3 g/dL — AB (ref 6.5–8.1)

## 2016-10-11 LAB — CBC WITH DIFFERENTIAL/PLATELET
BAND NEUTROPHILS: 0 %
BASOS ABS: 0 10*3/uL (ref 0.0–0.1)
BLASTS: 0 %
Basophils Relative: 0 %
EOS ABS: 1.4 10*3/uL — AB (ref 0.0–0.7)
Eosinophils Relative: 1 %
HEMATOCRIT: 27.4 % — AB (ref 39.0–52.0)
Hemoglobin: 9.1 g/dL — ABNORMAL LOW (ref 13.0–17.0)
Lymphocytes Relative: 97 %
Lymphs Abs: 133.2 10*3/uL — ABNORMAL HIGH (ref 0.7–4.0)
MCH: 33.8 pg (ref 26.0–34.0)
MCHC: 33.2 g/dL (ref 30.0–36.0)
MCV: 101.9 fL — ABNORMAL HIGH (ref 78.0–100.0)
METAMYELOCYTES PCT: 0 %
MONOS PCT: 2 %
Monocytes Absolute: 2.7 10*3/uL — ABNORMAL HIGH (ref 0.1–1.0)
Myelocytes: 0 %
NEUTROS ABS: 0 10*3/uL — AB (ref 1.7–7.7)
Neutrophils Relative %: 0 %
Other: 0 %
PROMYELOCYTES ABS: 0 %
Platelets: 65 10*3/uL — ABNORMAL LOW (ref 150–400)
RBC: 2.69 MIL/uL — ABNORMAL LOW (ref 4.22–5.81)
RDW: 18.2 % — AB (ref 11.5–15.5)
WBC: 137.3 10*3/uL (ref 4.0–10.5)
nRBC: 0 /100 WBC

## 2016-10-11 LAB — URIC ACID: Uric Acid, Serum: 9 mg/dL — ABNORMAL HIGH (ref 4.4–7.6)

## 2016-10-11 LAB — LACTATE DEHYDROGENASE: LDH: 252 U/L — ABNORMAL HIGH (ref 98–192)

## 2016-10-11 NOTE — Progress Notes (Unsigned)
CRITICAL VALUE ALERT Critical value received:  WBC 137.3 Date of notification:  10/11/16 Time of notification: 1123 Critical value read back:  Yes.   Nurse who received alert:  Isidoro Donning RN MD notified: Sherril Cong notified. He said ok. We are trying to get patient Imbruvica oral chemo.

## 2016-10-12 MED ORDER — IDELALISIB 150 MG PO TABS: 150.0000 mg | ORAL_TABLET | Freq: Two times a day (BID) | ORAL | 0 refills | Status: DC

## 2016-10-12 NOTE — Addendum Note (Signed)
Addended by: Baird Cancer on: 10/12/2016 12:51 PM   Modules accepted: Orders

## 2016-10-12 NOTE — Progress Notes (Signed)
START OFF PATHWAY REGIMEN - Lymphoma and CLL   [id number]:Rituximab x 8 doses + Idelalisib 150 mg PO BID:   Rituximab x 8 doses (see below):     Rituximab    Rituximab x 8 doses (see below):     Rituximab    Administer daily:     Idelalisib   **Always confirm dose/schedule in your pharmacy ordering system**    Patient Characteristics: Chronic Lymphocytic Leukemia (CLL), Second Line, 17p del (-), No Prior Ibrutinib Disease Type: Chronic Lymphocytic Leukemia (CLL) Disease Type: Not Applicable Line of therapy: Second Line RAI Stage: IV 17p Deletion Status: Negative Prior Ibrutinib? No Prior Ibrutinib Intent of Therapy: Non-Curative / Palliative Intent, Discussed with Patient

## 2016-10-18 ENCOUNTER — Other Ambulatory Visit (HOSPITAL_COMMUNITY): Payer: Self-pay | Admitting: *Deleted

## 2016-10-18 ENCOUNTER — Encounter (HOSPITAL_COMMUNITY): Payer: Medicare Other

## 2016-10-18 ENCOUNTER — Encounter (HOSPITAL_COMMUNITY): Payer: Self-pay | Admitting: Emergency Medicine

## 2016-10-18 DIAGNOSIS — E790 Hyperuricemia without signs of inflammatory arthritis and tophaceous disease: Secondary | ICD-10-CM

## 2016-10-18 DIAGNOSIS — C9110 Chronic lymphocytic leukemia of B-cell type not having achieved remission: Secondary | ICD-10-CM

## 2016-10-18 DIAGNOSIS — C8190 Hodgkin lymphoma, unspecified, unspecified site: Secondary | ICD-10-CM | POA: Diagnosis not present

## 2016-10-18 DIAGNOSIS — Z23 Encounter for immunization: Secondary | ICD-10-CM | POA: Diagnosis not present

## 2016-10-18 LAB — CBC WITH DIFFERENTIAL/PLATELET
BAND NEUTROPHILS: 0 %
BASOS ABS: 0 10*3/uL (ref 0.0–0.1)
BASOS PCT: 0 %
Blasts: 0 %
EOS ABS: 1.6 10*3/uL — AB (ref 0.0–0.7)
EOS PCT: 1 %
HCT: 30.7 % — ABNORMAL LOW (ref 39.0–52.0)
Hemoglobin: 9.4 g/dL — ABNORMAL LOW (ref 13.0–17.0)
LYMPHS ABS: 152 10*3/uL — AB (ref 0.7–4.0)
Lymphocytes Relative: 98 %
MCH: 32.6 pg (ref 26.0–34.0)
MCHC: 30.6 g/dL (ref 30.0–36.0)
MCV: 106.6 fL — ABNORMAL HIGH (ref 78.0–100.0)
METAMYELOCYTES PCT: 0 %
MONO ABS: 0 10*3/uL — AB (ref 0.1–1.0)
MONOS PCT: 0 %
MYELOCYTES: 0 %
NEUTROS ABS: 1.6 10*3/uL — AB (ref 1.7–7.7)
Neutrophils Relative %: 1 %
Other: 0 %
PLATELETS: 71 10*3/uL — AB (ref 150–400)
Promyelocytes Absolute: 0 %
RBC: 2.88 MIL/uL — ABNORMAL LOW (ref 4.22–5.81)
RDW: 18.4 % — AB (ref 11.5–15.5)
WBC: 155.2 10*3/uL — AB (ref 4.0–10.5)
nRBC: 0 /100 WBC

## 2016-10-18 LAB — COMPREHENSIVE METABOLIC PANEL
ALT: 23 U/L (ref 17–63)
AST: 47 U/L — ABNORMAL HIGH (ref 15–41)
Albumin: 3.1 g/dL — ABNORMAL LOW (ref 3.5–5.0)
Alkaline Phosphatase: 174 U/L — ABNORMAL HIGH (ref 38–126)
Anion gap: 8 (ref 5–15)
BUN: 24 mg/dL — ABNORMAL HIGH (ref 6–20)
CO2: 21 mmol/L — ABNORMAL LOW (ref 22–32)
Calcium: 8.6 mg/dL — ABNORMAL LOW (ref 8.9–10.3)
Chloride: 105 mmol/L (ref 101–111)
Creatinine, Ser: 2.59 mg/dL — ABNORMAL HIGH (ref 0.61–1.24)
GFR calc Af Amer: 27 mL/min — ABNORMAL LOW (ref >60)
GFR calc non Af Amer: 23 mL/min — ABNORMAL LOW (ref >60)
Glucose, Bld: 113 mg/dL — ABNORMAL HIGH (ref 65–99)
Potassium: 5.4 mmol/L — ABNORMAL HIGH (ref 3.5–5.1)
Sodium: 134 mmol/L — ABNORMAL LOW (ref 135–145)
Total Bilirubin: 0.6 mg/dL (ref 0.3–1.2)
Total Protein: 6 g/dL — ABNORMAL LOW (ref 6.5–8.1)

## 2016-10-18 LAB — URIC ACID: URIC ACID, SERUM: 6.8 mg/dL (ref 4.4–7.6)

## 2016-10-18 LAB — LACTATE DEHYDROGENASE: LDH: 248 U/L — ABNORMAL HIGH (ref 98–192)

## 2016-10-18 MED ORDER — SODIUM POLYSTYRENE SULFONATE 15 GM/60ML PO SUSP: 30.0000 g | Freq: Once | ORAL | 0 refills | Status: AC

## 2016-10-18 NOTE — Progress Notes (Signed)
CRITICAL VALUE ALERT Critical value received:  WBC 155.2 Date of notification:  10/18/2016 Time of notification: 4034 Critical value read back:  Yes.   Nurse who received alert:  Shellia Carwin RN MD notified (1st page):  Dr Dierdre Highman came up to the clinic and he wanted to know the side effects of the drug imbruvica.  So we went over the side effects of the drug, and a hand out was given on the drug.  All questions answered.  He verbalized understanding.

## 2016-10-19 MED ORDER — OCTREOTIDE ACETATE 30 MG IM KIT
PACK | INTRAMUSCULAR | Status: AC
Start: 2016-10-19 — End: ?
  Filled 2016-10-19: qty 1

## 2016-10-25 ENCOUNTER — Encounter (HOSPITAL_COMMUNITY): Payer: Medicare Other

## 2016-10-25 ENCOUNTER — Encounter (HOSPITAL_COMMUNITY): Payer: Self-pay | Admitting: Emergency Medicine

## 2016-10-25 DIAGNOSIS — C9110 Chronic lymphocytic leukemia of B-cell type not having achieved remission: Secondary | ICD-10-CM

## 2016-10-25 DIAGNOSIS — C8190 Hodgkin lymphoma, unspecified, unspecified site: Secondary | ICD-10-CM | POA: Diagnosis not present

## 2016-10-25 DIAGNOSIS — Z23 Encounter for immunization: Secondary | ICD-10-CM | POA: Diagnosis not present

## 2016-10-25 DIAGNOSIS — D801 Nonfamilial hypogammaglobulinemia: Secondary | ICD-10-CM

## 2016-10-25 DIAGNOSIS — E790 Hyperuricemia without signs of inflammatory arthritis and tophaceous disease: Secondary | ICD-10-CM

## 2016-10-25 LAB — COMPREHENSIVE METABOLIC PANEL
ALBUMIN: 3.2 g/dL — AB (ref 3.5–5.0)
ALT: 23 U/L (ref 17–63)
ANION GAP: 10 (ref 5–15)
AST: 49 U/L — AB (ref 15–41)
Alkaline Phosphatase: 167 U/L — ABNORMAL HIGH (ref 38–126)
BUN: 31 mg/dL — AB (ref 6–20)
CO2: 19 mmol/L — AB (ref 22–32)
Calcium: 8.7 mg/dL — ABNORMAL LOW (ref 8.9–10.3)
Chloride: 102 mmol/L (ref 101–111)
Creatinine, Ser: 2.57 mg/dL — ABNORMAL HIGH (ref 0.61–1.24)
GFR calc Af Amer: 27 mL/min — ABNORMAL LOW (ref >60)
GFR calc non Af Amer: 23 mL/min — ABNORMAL LOW (ref >60)
GLUCOSE: 144 mg/dL — AB (ref 65–99)
POTASSIUM: 5.9 mmol/L — AB (ref 3.5–5.1)
SODIUM: 131 mmol/L — AB (ref 135–145)
Total Bilirubin: 0.9 mg/dL (ref 0.3–1.2)
Total Protein: 6 g/dL — ABNORMAL LOW (ref 6.5–8.1)

## 2016-10-25 LAB — CBC WITH DIFFERENTIAL/PLATELET
BAND NEUTROPHILS: 0 %
BASOS ABS: 0 10*3/uL (ref 0.0–0.1)
BLASTS: 0 %
Basophils Relative: 0 %
EOS ABS: 3.9 10*3/uL — AB (ref 0.0–0.7)
Eosinophils Relative: 1 %
HEMATOCRIT: 31.3 % — AB (ref 39.0–52.0)
HEMOGLOBIN: 8.3 g/dL — AB (ref 13.0–17.0)
LYMPHS ABS: 374.2 10*3/uL — AB (ref 0.7–4.0)
Lymphocytes Relative: 96 %
MCH: 30.3 pg (ref 26.0–34.0)
MCHC: 26.5 g/dL — AB (ref 30.0–36.0)
MCV: 114.2 fL — ABNORMAL HIGH (ref 78.0–100.0)
METAMYELOCYTES PCT: 0 %
MONOS PCT: 0 %
MYELOCYTES: 0 %
Monocytes Absolute: 0 10*3/uL — ABNORMAL LOW (ref 0.1–1.0)
NEUTROS ABS: 11.7 10*3/uL — AB (ref 1.7–7.7)
NRBC: 0 /100{WBCs}
Neutrophils Relative %: 3 %
Other: 0 %
PLATELETS: 48 10*3/uL — AB (ref 150–400)
Promyelocytes Absolute: 0 %
RBC: 2.74 MIL/uL — ABNORMAL LOW (ref 4.22–5.81)
WBC: 389.8 10*3/uL (ref 4.0–10.5)

## 2016-10-25 LAB — URIC ACID: Uric Acid, Serum: 5.5 mg/dL (ref 4.4–7.6)

## 2016-10-25 LAB — LACTATE DEHYDROGENASE: LDH: 282 U/L — ABNORMAL HIGH (ref 98–192)

## 2016-10-25 NOTE — Progress Notes (Unsigned)
CRITICAL VALUE ALERT Critical value received:  WBC 389.8 Date of notification:  10/25/16 Time of notification: 1123 Critical value read back:  Yes.   Nurse who received alert:  Isidoro Donning RN MD notified (1st page): Dr. Talbert Cage - if patient is on Imbruvica - this is probably reactive leucocytosis. Anderson Malta to check with patient and see what is going on with patient and whether he is taking his Imbruvica.  Update - patient has been on Imbruvica since Wednesday of last week. Dr. Talbert Cage aware per Anderson Malta RN Onc Navigator.

## 2016-10-25 NOTE — Progress Notes (Signed)
Pt does still need to take IVIG monthly along with his imbruvica daily per Kirby Crigler PA.  Pt here in clinic today asking about this information.  Printed scheduled and went over this information with the patient.  Also went over how to take the imodium if he had diarrhea.

## 2016-10-26 ENCOUNTER — Other Ambulatory Visit (HOSPITAL_COMMUNITY): Payer: Self-pay | Admitting: Adult Health

## 2016-10-26 DIAGNOSIS — C9110 Chronic lymphocytic leukemia of B-cell type not having achieved remission: Secondary | ICD-10-CM

## 2016-10-26 DIAGNOSIS — E875 Hyperkalemia: Secondary | ICD-10-CM

## 2016-10-26 LAB — IGG, IGA, IGM
IGA: 6 mg/dL — AB (ref 61–437)
IGG (IMMUNOGLOBIN G), SERUM: 852 mg/dL (ref 700–1600)
IgM, Serum: 15 mg/dL (ref 15–143)

## 2016-10-26 MED ORDER — SODIUM POLYSTYRENE SULFONATE 15 GM/60ML PO SUSP
15.0000 g | Freq: Once | ORAL | 0 refills | Status: AC
Start: 2016-10-26 — End: 2016-10-26

## 2016-11-01 ENCOUNTER — Ambulatory Visit (HOSPITAL_COMMUNITY): Payer: Medicare Other

## 2016-11-01 ENCOUNTER — Emergency Department (HOSPITAL_COMMUNITY)
Admission: EM | Admit: 2016-11-01 | Discharge: 2016-11-01 | Disposition: A | Discharge status: Home / Self Care | Payer: Medicare Other | Attending: Emergency Medicine | Admitting: Emergency Medicine

## 2016-11-01 ENCOUNTER — Other Ambulatory Visit (HOSPITAL_COMMUNITY): Payer: Medicare Other

## 2016-11-01 ENCOUNTER — Encounter (HOSPITAL_COMMUNITY): Payer: Self-pay | Admitting: Emergency Medicine

## 2016-11-01 ENCOUNTER — Other Ambulatory Visit: Payer: Self-pay

## 2016-11-01 ENCOUNTER — Emergency Department (HOSPITAL_COMMUNITY): Payer: Medicare Other

## 2016-11-01 DIAGNOSIS — J181 Lobar pneumonia, unspecified organism: Secondary | ICD-10-CM | POA: Insufficient documentation

## 2016-11-01 DIAGNOSIS — D801 Nonfamilial hypogammaglobulinemia: Secondary | ICD-10-CM | POA: Insufficient documentation

## 2016-11-01 DIAGNOSIS — I071 Rheumatic tricuspid insufficiency: Secondary | ICD-10-CM | POA: Diagnosis not present

## 2016-11-01 DIAGNOSIS — C9111 Chronic lymphocytic leukemia of B-cell type in remission: Secondary | ICD-10-CM | POA: Diagnosis not present

## 2016-11-01 DIAGNOSIS — J189 Pneumonia, unspecified organism: Secondary | ICD-10-CM

## 2016-11-01 DIAGNOSIS — C9190 Lymphoid leukemia, unspecified not having achieved remission: Secondary | ICD-10-CM | POA: Diagnosis not present

## 2016-11-01 DIAGNOSIS — C9112 Chronic lymphocytic leukemia of B-cell type in relapse: Secondary | ICD-10-CM

## 2016-11-01 DIAGNOSIS — I469 Cardiac arrest, cause unspecified: Secondary | ICD-10-CM | POA: Diagnosis not present

## 2016-11-01 DIAGNOSIS — R6521 Severe sepsis with septic shock: Secondary | ICD-10-CM | POA: Diagnosis not present

## 2016-11-01 DIAGNOSIS — R0602 Shortness of breath: Secondary | ICD-10-CM

## 2016-11-01 DIAGNOSIS — J9691 Respiratory failure, unspecified with hypoxia: Secondary | ICD-10-CM | POA: Diagnosis not present

## 2016-11-01 DIAGNOSIS — A419 Sepsis, unspecified organism: Secondary | ICD-10-CM | POA: Diagnosis not present

## 2016-11-01 DIAGNOSIS — I739 Peripheral vascular disease, unspecified: Secondary | ICD-10-CM | POA: Diagnosis not present

## 2016-11-01 DIAGNOSIS — Z952 Presence of prosthetic heart valve: Secondary | ICD-10-CM | POA: Diagnosis not present

## 2016-11-01 DIAGNOSIS — R531 Weakness: Secondary | ICD-10-CM | POA: Diagnosis present

## 2016-11-01 DIAGNOSIS — N184 Chronic kidney disease, stage 4 (severe): Secondary | ICD-10-CM | POA: Insufficient documentation

## 2016-11-01 DIAGNOSIS — E875 Hyperkalemia: Secondary | ICD-10-CM | POA: Diagnosis not present

## 2016-11-01 DIAGNOSIS — E883 Tumor lysis syndrome: Secondary | ICD-10-CM | POA: Diagnosis not present

## 2016-11-01 DIAGNOSIS — J690 Pneumonitis due to inhalation of food and vomit: Secondary | ICD-10-CM | POA: Diagnosis not present

## 2016-11-01 DIAGNOSIS — Z79899 Other long term (current) drug therapy: Secondary | ICD-10-CM | POA: Diagnosis not present

## 2016-11-01 DIAGNOSIS — K219 Gastro-esophageal reflux disease without esophagitis: Secondary | ICD-10-CM | POA: Diagnosis not present

## 2016-11-01 DIAGNOSIS — Z7982 Long term (current) use of aspirin: Secondary | ICD-10-CM | POA: Insufficient documentation

## 2016-11-01 DIAGNOSIS — C9192 Lymphoid leukemia, unspecified, in relapse: Secondary | ICD-10-CM | POA: Diagnosis not present

## 2016-11-01 DIAGNOSIS — Z66 Do not resuscitate: Secondary | ICD-10-CM | POA: Diagnosis not present

## 2016-11-01 DIAGNOSIS — Z9911 Dependence on respirator [ventilator] status: Secondary | ICD-10-CM | POA: Diagnosis not present

## 2016-11-01 DIAGNOSIS — I2510 Atherosclerotic heart disease of native coronary artery without angina pectoris: Secondary | ICD-10-CM | POA: Diagnosis not present

## 2016-11-01 DIAGNOSIS — J9601 Acute respiratory failure with hypoxia: Secondary | ICD-10-CM | POA: Diagnosis not present

## 2016-11-01 DIAGNOSIS — I959 Hypotension, unspecified: Secondary | ICD-10-CM | POA: Diagnosis not present

## 2016-11-01 DIAGNOSIS — C9110 Chronic lymphocytic leukemia of B-cell type not having achieved remission: Secondary | ICD-10-CM | POA: Diagnosis not present

## 2016-11-01 DIAGNOSIS — E785 Hyperlipidemia, unspecified: Secondary | ICD-10-CM | POA: Diagnosis not present

## 2016-11-01 DIAGNOSIS — E871 Hypo-osmolality and hyponatremia: Secondary | ICD-10-CM | POA: Diagnosis not present

## 2016-11-01 DIAGNOSIS — D72829 Elevated white blood cell count, unspecified: Secondary | ICD-10-CM | POA: Diagnosis not present

## 2016-11-01 DIAGNOSIS — D759 Disease of blood and blood-forming organs, unspecified: Secondary | ICD-10-CM | POA: Diagnosis not present

## 2016-11-01 DIAGNOSIS — I5021 Acute systolic (congestive) heart failure: Secondary | ICD-10-CM | POA: Diagnosis not present

## 2016-11-01 DIAGNOSIS — I4891 Unspecified atrial fibrillation: Secondary | ICD-10-CM | POA: Diagnosis not present

## 2016-11-01 DIAGNOSIS — D72820 Lymphocytosis (symptomatic): Secondary | ICD-10-CM | POA: Diagnosis not present

## 2016-11-01 DIAGNOSIS — Z951 Presence of aortocoronary bypass graft: Secondary | ICD-10-CM | POA: Insufficient documentation

## 2016-11-01 DIAGNOSIS — Z515 Encounter for palliative care: Secondary | ICD-10-CM | POA: Diagnosis not present

## 2016-11-01 DIAGNOSIS — D696 Thrombocytopenia, unspecified: Secondary | ICD-10-CM | POA: Diagnosis not present

## 2016-11-01 DIAGNOSIS — I517 Cardiomegaly: Secondary | ICD-10-CM | POA: Diagnosis not present

## 2016-11-01 DIAGNOSIS — E872 Acidosis: Secondary | ICD-10-CM | POA: Diagnosis not present

## 2016-11-01 DIAGNOSIS — I272 Pulmonary hypertension, unspecified: Secondary | ICD-10-CM | POA: Diagnosis not present

## 2016-11-01 DIAGNOSIS — N179 Acute kidney failure, unspecified: Secondary | ICD-10-CM | POA: Diagnosis not present

## 2016-11-01 DIAGNOSIS — R570 Cardiogenic shock: Secondary | ICD-10-CM | POA: Diagnosis not present

## 2016-11-01 DIAGNOSIS — C9510 Chronic leukemia of unspecified cell type not having achieved remission: Secondary | ICD-10-CM | POA: Diagnosis not present

## 2016-11-01 DIAGNOSIS — Z431 Encounter for attention to gastrostomy: Secondary | ICD-10-CM | POA: Diagnosis not present

## 2016-11-01 DIAGNOSIS — I130 Hypertensive heart and chronic kidney disease with heart failure and stage 1 through stage 4 chronic kidney disease, or unspecified chronic kidney disease: Secondary | ICD-10-CM | POA: Diagnosis not present

## 2016-11-01 DIAGNOSIS — I429 Cardiomyopathy, unspecified: Secondary | ICD-10-CM | POA: Diagnosis not present

## 2016-11-01 DIAGNOSIS — R918 Other nonspecific abnormal finding of lung field: Secondary | ICD-10-CM | POA: Diagnosis not present

## 2016-11-01 DIAGNOSIS — I129 Hypertensive chronic kidney disease with stage 1 through stage 4 chronic kidney disease, or unspecified chronic kidney disease: Secondary | ICD-10-CM | POA: Diagnosis not present

## 2016-11-01 DIAGNOSIS — C8190 Hodgkin lymphoma, unspecified, unspecified site: Secondary | ICD-10-CM | POA: Diagnosis not present

## 2016-11-01 LAB — BLOOD GAS, ARTERIAL
Acid-base deficit: 14.4 mmol/L — ABNORMAL HIGH (ref 0.0–2.0)
Bicarbonate: 13.2 mmol/L — ABNORMAL LOW (ref 20.0–28.0)
DRAWN BY: 277331
Expiratory PAP: 6
FIO2: 0.4
Inspiratory PAP: 16
Mode: POSITIVE
O2 SAT: 95.7 %
PATIENT TEMPERATURE: 37
PCO2 ART: 20.9 mmHg — AB (ref 32.0–48.0)
PH ART: 7.325 — AB (ref 7.350–7.450)
RATE: 10 resp/min
pO2, Arterial: 83.7 mmHg (ref 83.0–108.0)

## 2016-11-01 LAB — BASIC METABOLIC PANEL
ANION GAP: 12 (ref 5–15)
BUN: 48 mg/dL — ABNORMAL HIGH (ref 6–20)
CALCIUM: 8.3 mg/dL — AB (ref 8.9–10.3)
CO2: 13 mmol/L — AB (ref 22–32)
CREATININE: 2.65 mg/dL — AB (ref 0.61–1.24)
Chloride: 102 mmol/L (ref 101–111)
GFR calc Af Amer: 26 mL/min — ABNORMAL LOW (ref >60)
GFR calc non Af Amer: 23 mL/min — ABNORMAL LOW (ref >60)
GLUCOSE: 139 mg/dL — AB (ref 65–99)
Potassium: 4.9 mmol/L (ref 3.5–5.1)
Sodium: 127 mmol/L — ABNORMAL LOW (ref 135–145)

## 2016-11-01 LAB — URINALYSIS, ROUTINE W REFLEX MICROSCOPIC
BACTERIA UA: NONE SEEN
BILIRUBIN URINE: NEGATIVE
Glucose, UA: NEGATIVE mg/dL
HGB URINE DIPSTICK: NEGATIVE
Ketones, ur: NEGATIVE mg/dL
NITRITE: NEGATIVE
PROTEIN: 30 mg/dL — AB
SPECIFIC GRAVITY, URINE: 1.019 (ref 1.005–1.030)
pH: 5 (ref 5.0–8.0)

## 2016-11-01 LAB — I-STAT CG4 LACTIC ACID, ED
LACTIC ACID, VENOUS: 2.31 mmol/L — AB (ref 0.5–1.9)
Lactic Acid, Venous: 1.71 mmol/L (ref 0.5–1.9)

## 2016-11-01 LAB — I-STAT CHEM 8, ED
BUN: 44 mg/dL — ABNORMAL HIGH (ref 6–20)
CALCIUM ION: 1.12 mmol/L — AB (ref 1.15–1.40)
Chloride: 103 mmol/L (ref 101–111)
Creatinine, Ser: 2.9 mg/dL — ABNORMAL HIGH (ref 0.61–1.24)
Glucose, Bld: 135 mg/dL — ABNORMAL HIGH (ref 65–99)
HEMATOCRIT: 30 % — AB (ref 39.0–52.0)
HEMOGLOBIN: 10.2 g/dL — AB (ref 13.0–17.0)
Potassium: 5.7 mmol/L — ABNORMAL HIGH (ref 3.5–5.1)
SODIUM: 127 mmol/L — AB (ref 135–145)
TCO2: 14 mmol/L (ref 0–100)

## 2016-11-01 LAB — I-STAT TROPONIN, ED: TROPONIN I, POC: 0.05 ng/mL (ref 0.00–0.08)

## 2016-11-01 LAB — HEPATIC FUNCTION PANEL
ALBUMIN: 3.2 g/dL — AB (ref 3.5–5.0)
ALT: 24 U/L (ref 17–63)
AST: 52 U/L — ABNORMAL HIGH (ref 15–41)
Alkaline Phosphatase: 183 U/L — ABNORMAL HIGH (ref 38–126)
BILIRUBIN INDIRECT: 0.6 mg/dL (ref 0.3–0.9)
Bilirubin, Direct: 0.2 mg/dL (ref 0.1–0.5)
TOTAL PROTEIN: 6 g/dL — AB (ref 6.5–8.1)
Total Bilirubin: 0.8 mg/dL (ref 0.3–1.2)

## 2016-11-01 LAB — CBC
HEMATOCRIT: 34.1 % — AB (ref 39.0–52.0)
Hemoglobin: 8.5 g/dL — ABNORMAL LOW (ref 13.0–17.0)
MCH: 28.6 pg (ref 26.0–34.0)
MCHC: 24.9 g/dL — ABNORMAL LOW (ref 30.0–36.0)
MCV: 114.8 fL — AB (ref 78.0–100.0)
PLATELETS: 53 10*3/uL — AB (ref 150–400)
RBC: 2.97 MIL/uL — AB (ref 4.22–5.81)
WBC: 461.6 10*3/uL — AB (ref 4.0–10.5)

## 2016-11-01 LAB — CBG MONITORING, ED: GLUCOSE-CAPILLARY: 163 mg/dL — AB (ref 65–99)

## 2016-11-01 LAB — URIC ACID: URIC ACID, SERUM: 7.9 mg/dL — AB (ref 4.4–7.6)

## 2016-11-01 MED ORDER — DILTIAZEM HCL 100 MG IV SOLR
INTRAVENOUS | Status: AC
  Filled 2016-11-01: qty 100

## 2016-11-01 MED ORDER — SODIUM CHLORIDE 0.9 % IV BOLUS (SEPSIS)
1000.0000 mL | Freq: Once | INTRAVENOUS | Status: AC
  Administered 2016-11-01: 1000 mL via INTRAVENOUS

## 2016-11-01 MED ORDER — LEVOFLOXACIN IN D5W 750 MG/150ML IV SOLN
750.0000 mg | Freq: Once | INTRAVENOUS | Status: AC
  Administered 2016-11-01: 750 mg via INTRAVENOUS
  Filled 2016-11-01: qty 150

## 2016-11-01 MED ORDER — VANCOMYCIN HCL IN DEXTROSE 1-5 GM/200ML-% IV SOLN
1000.0000 mg | Freq: Once | INTRAVENOUS | Status: AC
  Administered 2016-11-01: 1000 mg via INTRAVENOUS
  Filled 2016-11-01: qty 200

## 2016-11-01 MED ORDER — DILTIAZEM LOAD VIA INFUSION
20.0000 mg | Freq: Once | INTRAVENOUS | Status: AC
  Administered 2016-11-01: 20 mg via INTRAVENOUS
  Filled 2016-11-01: qty 20

## 2016-11-01 MED ORDER — DILTIAZEM HCL 100 MG IV SOLR
5.0000 mg/h | INTRAVENOUS | Status: DC
  Administered 2016-11-01: 5 mg/h via INTRAVENOUS

## 2016-11-01 MED ORDER — SODIUM CHLORIDE 0.9 % IV BOLUS (SEPSIS): 1000.0000 mL | Freq: Once | INTRAVENOUS | Status: DC

## 2016-11-01 MED ORDER — ALBUTEROL SULFATE (2.5 MG/3ML) 0.083% IN NEBU
5.0000 mg | INHALATION_SOLUTION | Freq: Once | RESPIRATORY_TRACT | Status: AC
  Administered 2016-11-01: 5 mg via RESPIRATORY_TRACT
  Filled 2016-11-01: qty 6

## 2016-11-01 NOTE — ED Notes (Signed)
Report given to Summer, RN at [facility] at this time.

## 2016-11-01 NOTE — ED Notes (Signed)
MD Haviland notified of pt's increased work of breathing, tachycardia, and tachypnea. Respiratory paged to place pt on BiPAP at this time.

## 2016-11-01 NOTE — ED Notes (Signed)
Called [facility] and the icu medical doctor was paged doctor sy

## 2016-11-01 NOTE — ED Notes (Signed)
SBP 80's.  NS bolus started informed Dr Gilford Raid of SBP, to hold Cardizem until SBP above 90's.

## 2016-11-01 NOTE — ED Notes (Signed)
Date and time results received: 11/01/16 8:16 AM  (use smartphrase ".now" to insert current time)  Test: WBC Critical Value: 461.6  Name of Provider Notified: [place]  Orders Received? Or Actions Taken?: Orders Received - See Orders for details

## 2016-11-01 NOTE — ED Notes (Signed)
Port CXR done.

## 2016-11-01 NOTE — ED Notes (Signed)
20 mg bolus cardizem given per MD Haviland.

## 2016-11-01 NOTE — ED Notes (Signed)
Pt became SOB with movement while obtaining urine sample.  Hr A-Fib with RVR 120-130".  Dr Gilford Raid informed.  Ordered to begin Cardizem drop bolus and drip as ordered.  SBP 112

## 2016-11-01 NOTE — ED Notes (Signed)
Paged doctor brent mcquid intensivist on call  for doctor [place]

## 2016-11-01 NOTE — ED Triage Notes (Signed)
Patient complains of shortness of breath. States history of leukemia and currently receiving chemotherapy. Patient states started taking Imbruvica 12 days ago.  Patient patient also complains of generalized weakness x 3 days.

## 2016-11-01 NOTE — ED Notes (Signed)
Called provider oncall for oncology and transferred to doctor haviland

## 2016-11-01 NOTE — ED Notes (Signed)
Respiratory notified of ABG order

## 2016-11-01 NOTE — ED Provider Notes (Signed)
[facility] DEPT Provider Note   CSN: [id number] Arrival date & time: 11/01/16  0714     History   Chief Complaint Chief Complaint  Patient presents with  . Weakness  . Shortness of Breath    HPI [name] is a 71 y.o. male.  Pt presents to the ED today with sob.  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  He was recently started on Imbruvica at his last oncology visit on 7/11.  He did have an elevated potassium at his visit on 7/11.  He was given kayexalate.  His potassium started to go down, but then it went back up.  He was given another kayexalate after his lab draw on 7/30.  The pt's uric acid was also elevated, so they were concerned about tumor lysis syndrome.  The pt was started on allopurinol.  The pt is a poor historian, but said he has been weak for the past 3 days.  He is not sure if he has a hx of a.fib, but it is listed on his past medical hx.  He denies fever.  Pt also has hypogammaglobulinemia, started IVIG on 10/13/15 due to recurrent URI and remains on low-dose IVIG monthly. Pt is not on any anticoagulation.  ? Low platelets  CHA2DS2/VAS Stroke Risk Points      3 >= 2 Points: High Risk  1 - 1.99 Points: Medium Risk  0 Points: Low Risk    The previous score was 2 on 11/18/2015.:  Change:         Details    Note: External data might be a factor in metrics not marked with    Points Metrics   This score determines the patient's risk of having a stroke if the  patient has atrial fibrillation.       0 Has Congestive Heart Failure:  No   1 Has Vascular Disease:  Yes   1 Has Hypertension:  Yes   1 Age:  46   0 Has Diabetes:  No   0 Had Stroke:  No Had TIA:  No Had thromboembolism:  No   0 Male:  No             Past Medical History:  Diagnosis Date  . Acid reflux   . Aortic stenosis   . Arthritis   . Atrial fibrillation ([place])   . BPH (benign prostatic hypertrophy)   . CLL (chronic lymphoblastic leukemia)   .  Coronary atherosclerosis of native coronary artery   . Essential hypertension, benign   . Hodgkin's disease   . HOH (hard of hearing)   . Hyperlipidemia   . Hypogammaglobulinemia ([place])   . Indirect inguinal hernia 04/13/2013  . Kidney stones   . Pre-diabetes 08/04/2011  . Wears dentures   . Wears glasses     Patient Active Problem List   Diagnosis Date Noted  . Recurrent falls while walking 06/14/2015  . Pigmented skin lesion 10/10/2014  . Chronic kidney disease (CKD) 05/10/2014  . Indirect inguinal hernia 04/13/2013  . Cholelithiasis 01/11/2013  . Reduced vision 10/23/2012  . Piles (hemorrhoids) 06/19/2012  . Dental caries 05/12/2012  . Pre-diabetes 08/04/2011  . Thrombocytopenia ([place]) 08/02/2011  . Hypogammaglobulinemia, acquired ([place]) 08/02/2011  . Anemia 08/02/2011  . GERD (gastroesophageal reflux disease) 08/23/2010  . Essential hypertension, benign 05/06/2010  . Obesity 02/10/2010  . AORTIC VALVE REPLACEMENT, HX OF 04/23/2009  . CORONARY ATHEROSCLEROSIS NATIVE CORONARY ARTERY 02/26/2008  .  Atrial fibrillation ([place]) 02/26/2008  . Hodgkin's disease ([place]) 10/02/2007  . Leukemia, lymphocytic, chronic ([place]) 10/02/2007  . Mixed hyperlipidemia 04/07/2007    Past Surgical History:  Procedure Laterality Date  . AORTIC VALVE REPLACEMENT  10/09   23mm Magna Pericardial   . BONE MARROW ASPIRATION    . CATARACT EXTRACTION W/PHACO Right 01/07/2014   Procedure: CATARACT EXTRACTION PHACO AND INTRAOCULAR LENS PLACEMENT ([place]);  Surgeon: Tonny Branch, MD;  Location: [facility];  Service: Ophthalmology;  Laterality: Right;  CDE:15.31  . CATARACT EXTRACTION W/PHACO Left 03/25/2014   Procedure: CATARACT EXTRACTION PHACO AND INTRAOCULAR LENS PLACEMENT ([facility]);  Surgeon: Tonny Branch, MD;  Location: [facility];  Service: Ophthalmology;  Laterality: Left;  CDE 6.55  . COLON SURGERY    . CORONARY ARTERY BYPASS GRAFT  10/09   SVG to RCA  . DENTAL SURGERY  08/2012  . ESOPHAGOGASTRODUODENOSCOPY      scrapping of throat and stretching  . EYE SURGERY Right 02/07/2014   cataract  . LITHOTRIPSY    . MASS EXCISION N/A 12/05/2012   Procedure: EXCISION MIDLINE NECK MASS;  Surgeon: Ascencion Dike, MD;  Location: [facility];  Service: ENT;  Laterality: N/A;  . MULTIPLE TOOTH EXTRACTIONS    . PORTACATH PLACEMENT    . TONGUE BIOPSY  12/2012       Home Medications    Prior to Admission medications   Medication Sig Start Date End Date Taking? Authorizing Provider  acetaminophen (TYLENOL) 500 MG tablet Take 1,000 mg by mouth every 6 (six) hours as needed for moderate pain or fever.    Yes [provider]  acyclovir (ZOVIRAX) 200 MG capsule TAKE ONE CAPSULE BY MOUTH TWICE DAILY 07/23/16  Yes Holley Bouche, NP  allopurinol (ZYLOPRIM) 100 MG tablet Take 2 tablets (200 mg total) by mouth 2 (two) times daily. 10/06/16  Yes Baird Cancer, PA-C  aspirin EC 81 MG tablet Take 81 mg by mouth daily.   Yes [provider]  folic acid (FOLVITE) 1 MG tablet TAKE ONE TABLET BY MOUTH ONCE DAILY 09/23/16  Yes Kefalas, Manon Hilding, PA-C  Ibrutinib 420 MG TABS Take 1 tablet by mouth every evening.    Yes [provider]  metoprolol (LOPRESSOR) 50 MG tablet TAKE ONE TABLET BY MOUTH TWICE DAILY 05/24/16  Yes Satira Sark, MD  Multiple Vitamins-Minerals (CENTRUM SILVER ULTRA MENS PO) Take 1 tablet by mouth every morning.    Yes [provider]  OMEGA 3 1000 MG CAPS Take 1,000 mg by mouth daily.    Yes [provider]  omeprazole (PRILOSEC) 20 MG capsule Take 20 mg by mouth every morning.    Yes [provider]  simvastatin (ZOCOR) 40 MG tablet Take 1 tablet (40 mg total) by mouth at bedtime. 02/25/16  Yes Satira Sark, MD  sulfamethoxazole-trimethoprim (BACTRIM DS,SEPTRA DS) 800-160 MG tablet TAKE ONE TABLET BY MOUTH THREE TIMES A WEEK 06/21/16  Yes Kefalas, Manon Hilding, PA-C    Family History Family History  Problem Relation Age of Onset    . Heart failure Mother   . Blindness Mother   . Cancer Father 80       Lung   . Cancer Brother 97       acute leukemia     Social History Social History  Substance Use Topics  . Smoking status: Never Smoker  . Smokeless tobacco: Never Used     Comment: pt denies tobacco use   . Alcohol  use No     Comment: pt denies alcohol      Allergies   Azithromycin   Review of Systems Review of Systems  Constitutional: Positive for fatigue.  All other systems reviewed and are negative.    Physical Exam Updated Vital Signs BP 105/64   Pulse 86   Temp 97.7 F (36.5 C) (Oral)   Resp (!) 30   Ht 5\' 10"  (1.778 m)   Wt 90.7 kg (200 lb)   SpO2 93%   BMI 28.70 kg/m   Physical Exam  Constitutional: He is oriented to person, place, and time. He appears distressed.  HENT:  Head: Normocephalic and atraumatic.  Right Ear: External ear normal.  Left Ear: External ear normal.  Nose: Nose normal.  Mouth/Throat: Oropharynx is clear and moist.  Eyes: Pupils are equal, round, and reactive to light. Conjunctivae and EOM are normal.  Neck: Normal range of motion. Neck supple.  Cardiovascular: An irregularly irregular rhythm present. Tachycardia present.   Pulmonary/Chest: Accessory muscle usage present. Tachypnea noted.  Abdominal: Soft. Bowel sounds are normal.  Musculoskeletal: Normal range of motion.  Neurological: He is alert and oriented to person, place, and time.  Skin: He is diaphoretic.  Petechiae to bilateral legs  Nursing note and vitals reviewed.    ED Treatments / Results  Labs (all labs ordered are listed, but only abnormal results are displayed) Labs Reviewed  BASIC METABOLIC PANEL - Abnormal; Notable for the following:       Result Value   Sodium 127 (*)    CO2 13 (*)    Glucose, Bld 139 (*)    BUN 48 (*)    Creatinine, Ser 2.65 (*)    Calcium 8.3 (*)    GFR calc non Af Amer 23 (*)    GFR calc Af Amer 26 (*)    All other components within normal limits   CBC - Abnormal; Notable for the following:    WBC 461.6 (*)    RBC 2.97 (*)    Hemoglobin 8.5 (*)    HCT 34.1 (*)    MCV 114.8 (*)    MCHC 24.9 (*)    Platelets 53 (*)    All other components within normal limits  URINALYSIS, ROUTINE W REFLEX MICROSCOPIC - Abnormal; Notable for the following:    APPearance HAZY (*)    Protein, ur 30 (*)    Leukocytes, UA MODERATE (*)    Squamous Epithelial / LPF 0-5 (*)    All other components within normal limits  HEPATIC FUNCTION PANEL - Abnormal; Notable for the following:    Total Protein 6.0 (*)    Albumin 3.2 (*)    AST 52 (*)    Alkaline Phosphatase 183 (*)    All other components within normal limits  URIC ACID - Abnormal; Notable for the following:    Uric Acid, Serum 7.9 (*)    All other components within normal limits  BLOOD GAS, ARTERIAL - Abnormal; Notable for the following:    pH, Arterial 7.325 (*)    pCO2 arterial 20.9 (*)    Bicarbonate 13.2 (*)    Acid-base deficit 14.4 (*)    All other components within normal limits  I-STAT CG4 LACTIC ACID, ED - Abnormal; Notable for the following:    Lactic Acid, Venous 2.31 (*)    All other components within normal limits  I-STAT CHEM 8, ED - Abnormal; Notable for the following:    Sodium 127 (*)  Potassium 5.7 (*)    BUN 44 (*)    Creatinine, Ser 2.90 (*)    Glucose, Bld 135 (*)    Calcium, Ion 1.12 (*)    Hemoglobin 10.2 (*)    HCT 30.0 (*)    All other components within normal limits  CBG MONITORING, ED - Abnormal; Notable for the following:    Glucose-Capillary 163 (*)    All other components within normal limits  CULTURE, BLOOD (ROUTINE X 2)  CULTURE, BLOOD (ROUTINE X 2)  URINE CULTURE  I-STAT TROPONIN, ED  I-STAT CG4 LACTIC ACID, ED    EKG  EKG Interpretation  Date/Time:  Monday November 01 2016 07:25:12 EDT Ventricular Rate:  133 PR Interval:    QRS Duration: 153 QT Interval:  304 QTC Calculation: 453 R Axis:   80 Text Interpretation:  Atrial fibrillation  Nonspecific intraventricular conduction delay Anteroseptal infarct, possibly acute Lateral leads are also involved afib is new compared to prior Non-specific intra-ventricular conduction delay is old Confirmed by Isla Pence [phone]) on 11/01/2016 8:02:17 AM       Radiology Dg Chest Port 1 View  Result Date: 11/01/2016 CLINICAL DATA:  shortness of breath. States history of leukemia and currently receiving chemotherapy. Patient patient also complains of generalized weakness x 3 days/a fib/htn/hodgkin's disease/non smoker/hx aortic valve replacement/hx port a cath insertion EXAM: PORTABLE CHEST - 1 VIEW COMPARISON:  09/25/2015 FINDINGS: Stable right subclavian port catheter. New left perihilar infrahilar patchy airspace opacities. Central pulmonary vascular congestion. Heart size upper limits normal.  Atheromatous aorta. Blunting of the right lateral costophrenic angle suggesting small effusion. No pneumothorax. Previous median sternotomy. IMPRESSION: 1. New left perihilar and infrahilar patchy airspace opacities, small right effusion, and central pulmonary vascular congestion. May represent atypical edema versus infectious/inflammatory etiology. 2.  Aortic Atherosclerosis (ICD10-170.0) Electronically Signed   By: Lucrezia Europe M.D.   On: 11/01/2016 08:30    Procedures Procedures (including critical care time)  Medications Ordered in ED Medications  diltiazem (CARDIZEM) 1 mg/mL load via infusion 20 mg (20 mg Intravenous Bolus from Bag 11/01/16 1011)    And  diltiazem (CARDIZEM) 100 mg in dextrose 5 % 100 mL (1 mg/mL) infusion (0 mg/hr Intravenous Stopped 11/01/16 1048)  sodium chloride 0.9 % bolus 1,000 mL (0 mLs Intravenous Stopped 11/01/16 0836)  sodium chloride 0.9 % bolus 1,000 mL (0 mLs Intravenous Stopped 11/01/16 0908)  sodium chloride 0.9 % bolus 1,000 mL (0 mLs Intravenous Stopped 11/01/16 1152)  levofloxacin (LEVAQUIN) IVPB 750 mg (0 mg Intravenous Stopped 11/01/16 1059)  vancomycin (VANCOCIN) IVPB  1000 mg/200 mL premix (0 mg Intravenous Stopped 11/01/16 1217)  sodium chloride 0.9 % bolus 1,000 mL (0 mLs Intravenous Stopped 11/01/16 1217)  albuterol (PROVENTIL) (2.5 MG/3ML) 0.083% nebulizer solution 5 mg (5 mg Nebulization Given 11/01/16 1248)  sodium chloride 0.9 % bolus 1,000 mL (0 mLs Intravenous Stopped 11/01/16 1454)  sodium chloride 0.9 % bolus 1,000 mL (1,000 mLs Intravenous New Bag/Given 11/01/16 1509)     Initial Impression / Assessment and Plan / ED Course  I have reviewed the triage vital signs and the nursing notes.  Pertinent labs & imaging results that were available during my care of the patient were reviewed by me and considered in my medical decision making (see chart for details).    CRITICAL CARE Performed by: Isla Pence   Total critical care time: 90 minutes  Critical care time was exclusive of separately billable procedures and treating other patients.  Critical care was necessary  to treat or prevent imminent or life-threatening deterioration.  Critical care was time spent personally by me on the following activities: development of treatment plan with patient and/or surrogate as well as nursing, discussions with consultants, evaluation of patient's response to treatment, examination of patient, obtaining history from patient or surrogate, ordering and performing treatments and interventions, ordering and review of laboratory studies, ordering and review of radiographic studies, pulse oximetry and re-evaluation of patient's condition.  This patient meets SIRS Criteria and may be septic. SIRS = Systemic Inflammatory Response Syndrome  Best Practice Recommends:   Notify the nurse immediately to increase monitoring of patient.   The recent clinical data is shown below. Vitals:   11/01/16 1506 11/01/16 1510 11/01/16 1515 11/01/16 1530  BP:  95/62 (!) 93/51 105/64  Pulse: 69 82 (!) 38 86  Resp: 19 (!) 34 (!) 31 (!) 30  Temp:      TempSrc:      SpO2: 93% 95%  96% 93%  Weight:      Height:       While here, pt was given sepsis fluids.  His blood pressure would go up and hr would go down, but that would not last long.  When bp high enough, he was given 20 mg of cardizem which helped hr, but then dropped pressure.  He would then be given additional fluids.  He developed worsening sob, so he was put on bipap.  The bipap helped him significantly.   I thought about PE, but plt are so low, I don't think he would make a clot, but WBC so high.  We are unable to get a ct angio due to renal function.  I don't think he's stable for v/q.  I thought about lovenox, but I did not want to drop his already very low platelets, so I held off on anticoagulation.  Pt may have an element of tumor lysis syndrome, but thankfully, his potassium is elevated and renal function is stable.  Thrombocytopenia is chronic.   Pt d/w Dr. Marin Comment ([facility]).  He was concerned that pt may need plasma or leukopheresis.  He asked me to call oncology.  I spoke with Dr. Talbert Cage (oncology here).  She thought he would benefit from leukopheresis.  However, that procedure is not done here.  I then spoke with critical care at [facility] (Dr. Lake Bells).  He did not perform leukopheresis, so asked me to speak to oncology.  I spoke with Dr. Julien Nordmann (oncology at [facility]).  He does not do leukopheresis.  I then spoke with Dr. Lorrene Reid (nephrology) who does not either.    I then spoke with ICU (Dr. Maeola Harman) at [facility] who accepted pt for transfer.   Pt reexamined prior to d/c and remains stable for transfer.    Final Clinical Impressions(s) / ED Diagnoses   Final diagnoses:  Sepsis, due to unspecified organism [facility])  Community acquired pneumonia of left lower lobe of lung ([place])  CLL (chronic lymphoid leukemia) in relapse ([place])  Hyperviscosity  Atrial fibrillation with RVR ([place])  Hypogammaglobulinemia ([facility])  CRF (chronic renal failure), stage 4 (severe) ([facility])  Tumor lysis syndrome  Thrombocytopenia ([facility])     New Prescriptions New Prescriptions   No medications on file     Isla Pence, MD 11/01/16 1548

## 2016-11-01 NOTE — ED Notes (Signed)
EKG obtained after 2 mg bolus of Cardizem done.  Afib rate 90's and Dr Gilford Raid given EKG and to maintain Cardizem drip at 5mg /hr via pump.

## 2016-11-02 LAB — URINE CULTURE: Culture: NO GROWTH

## 2016-11-06 LAB — CULTURE, BLOOD (ROUTINE X 2)
Culture: NO GROWTH
Culture: NO GROWTH
SPECIAL REQUESTS: ADEQUATE

## 2016-11-10 ENCOUNTER — Other Ambulatory Visit: Payer: Self-pay | Admitting: Nurse Practitioner

## 2016-11-22 ENCOUNTER — Ambulatory Visit: Payer: Medicare Other

## 2016-11-27 DEATH — deceased

## 2017-01-27 ENCOUNTER — Ambulatory Visit: Payer: Medicare Other | Admitting: Family Medicine
